# Patient Record
Sex: Female | Born: 1960
Health system: Southern US, Community
[De-identification: ages and names within clinical notes are randomized; demographics above are authoritative.]

## PROBLEM LIST (undated history)

## (undated) DIAGNOSIS — C801 Malignant (primary) neoplasm, unspecified: Secondary | ICD-10-CM

## (undated) DIAGNOSIS — M1990 Unspecified osteoarthritis, unspecified site: Secondary | ICD-10-CM

## (undated) DIAGNOSIS — R112 Nausea with vomiting, unspecified: Secondary | ICD-10-CM

## (undated) DIAGNOSIS — K219 Gastro-esophageal reflux disease without esophagitis: Secondary | ICD-10-CM

## (undated) DIAGNOSIS — M545 Low back pain, unspecified: Secondary | ICD-10-CM

## (undated) DIAGNOSIS — G629 Polyneuropathy, unspecified: Secondary | ICD-10-CM

## (undated) DIAGNOSIS — Z9889 Other specified postprocedural states: Secondary | ICD-10-CM

## (undated) DIAGNOSIS — R7303 Prediabetes: Secondary | ICD-10-CM

## (undated) DIAGNOSIS — Z8489 Family history of other specified conditions: Secondary | ICD-10-CM

## (undated) DIAGNOSIS — G8929 Other chronic pain: Secondary | ICD-10-CM

## (undated) HISTORY — PX: APPENDECTOMY: SHX54

## (undated) HISTORY — PX: JOINT REPLACEMENT: SHX530

---

## 2005-10-08 ENCOUNTER — Other Ambulatory Visit: Admission: RE | Admit: 2005-10-08 | Discharge: 2005-10-08 | Payer: Self-pay | Admitting: Obstetrics and Gynecology

## 2009-10-08 ENCOUNTER — Encounter: Admission: RE | Admit: 2009-10-08 | Discharge: 2009-10-08 | Payer: Self-pay | Admitting: Family Medicine

## 2012-07-04 ENCOUNTER — Other Ambulatory Visit: Payer: Self-pay | Admitting: Family Medicine

## 2012-07-04 DIAGNOSIS — Z1231 Encounter for screening mammogram for malignant neoplasm of breast: Secondary | ICD-10-CM

## 2012-07-27 ENCOUNTER — Ambulatory Visit
Admission: RE | Admit: 2012-07-27 | Discharge: 2012-07-27 | Disposition: A | Discharge status: Home / Self Care | Payer: BC Managed Care – PPO | Source: Ambulatory Visit | Attending: Family Medicine | Admitting: Family Medicine

## 2012-07-27 DIAGNOSIS — Z1231 Encounter for screening mammogram for malignant neoplasm of breast: Secondary | ICD-10-CM

## 2012-09-17 LAB — CBC WITH DIFFERENTIAL/PLATELET
Basophil #: 0 10*3/uL (ref 0.0–0.1)
Eosinophil #: 0 10*3/uL (ref 0.0–0.7)
Eosinophil %: 0.3 %
HCT: 41.5 % (ref 35.0–47.0)
Lymphocyte %: 11.5 %
MCH: 32 pg (ref 26.0–34.0)
MCV: 92 fL (ref 80–100)
Monocyte #: 0.5 x10 3/mm (ref 0.2–0.9)
Monocyte %: 5.8 %
Neutrophil #: 6.4 10*3/uL (ref 1.4–6.5)
RBC: 4.52 10*6/uL (ref 3.80–5.20)
RDW: 12.7 % (ref 11.5–14.5)
WBC: 7.8 10*3/uL (ref 3.6–11.0)

## 2012-09-17 LAB — COMPREHENSIVE METABOLIC PANEL
Bilirubin,Total: 0.3 mg/dL (ref 0.2–1.0)
Calcium, Total: 8.9 mg/dL (ref 8.5–10.1)
Chloride: 106 mmol/L (ref 98–107)
Co2: 29 mmol/L (ref 21–32)
EGFR (Non-African Amer.): >60
Glucose: 131 mg/dL — ABNORMAL HIGH (ref 65–99)
Potassium: 4 mmol/L (ref 3.5–5.1)
SGOT(AST): 21 U/L (ref 15–37)
Total Protein: 8.1 g/dL (ref 6.4–8.2)

## 2012-09-18 ENCOUNTER — Observation Stay: Payer: Self-pay | Admitting: Internal Medicine

## 2012-09-18 LAB — LIPID PANEL
HDL Cholesterol: 59 mg/dL (ref 40–60)
Ldl Cholesterol, Calc: 119 mg/dL — ABNORMAL HIGH (ref 0–100)
VLDL Cholesterol, Calc: 10 mg/dL (ref 5–40)

## 2012-09-18 LAB — URINALYSIS, COMPLETE
Blood: NEGATIVE
Glucose,UR: NEGATIVE mg/dL (ref 0–75)
Ketone: NEGATIVE
Nitrite: NEGATIVE
Ph: 8 (ref 4.5–8.0)
Specific Gravity: 1.013 (ref 1.003–1.030)
WBC UR: 5 /HPF (ref 0–5)

## 2013-01-23 ENCOUNTER — Other Ambulatory Visit: Payer: Self-pay | Admitting: Family Medicine

## 2013-01-23 DIAGNOSIS — N951 Menopausal and female climacteric states: Secondary | ICD-10-CM

## 2013-04-03 ENCOUNTER — Ambulatory Visit
Admission: RE | Admit: 2013-04-03 | Discharge: 2013-04-03 | Disposition: A | Discharge status: Home / Self Care | Payer: BC Managed Care – PPO | Source: Ambulatory Visit | Attending: Family Medicine | Admitting: Family Medicine

## 2013-04-03 DIAGNOSIS — N951 Menopausal and female climacteric states: Secondary | ICD-10-CM

## 2013-07-12 ENCOUNTER — Other Ambulatory Visit: Payer: Self-pay

## 2013-07-12 DIAGNOSIS — Z1231 Encounter for screening mammogram for malignant neoplasm of breast: Secondary | ICD-10-CM

## 2013-08-23 ENCOUNTER — Ambulatory Visit
Admission: RE | Admit: 2013-08-23 | Discharge: 2013-08-23 | Disposition: A | Discharge status: Home / Self Care | Payer: BC Managed Care – PPO | Source: Ambulatory Visit

## 2013-08-23 DIAGNOSIS — Z1231 Encounter for screening mammogram for malignant neoplasm of breast: Secondary | ICD-10-CM

## 2014-08-28 ENCOUNTER — Other Ambulatory Visit: Payer: Self-pay

## 2014-08-28 DIAGNOSIS — Z1231 Encounter for screening mammogram for malignant neoplasm of breast: Secondary | ICD-10-CM

## 2014-09-07 ENCOUNTER — Ambulatory Visit
Admission: RE | Admit: 2014-09-07 | Discharge: 2014-09-07 | Disposition: A | Discharge status: Home / Self Care | Payer: BLUE CROSS/BLUE SHIELD | Source: Ambulatory Visit

## 2014-09-07 ENCOUNTER — Encounter (INDEPENDENT_AMBULATORY_CARE_PROVIDER_SITE_OTHER): Payer: Self-pay

## 2014-09-07 DIAGNOSIS — Z1231 Encounter for screening mammogram for malignant neoplasm of breast: Secondary | ICD-10-CM

## 2014-11-02 NOTE — Discharge Summary (Signed)
PATIENT NAME:  Stacey [name], Stacey [name] MR#:  [id number] DATE OF BIRTH:  1961/03/01  DATE OF ADMISSION:  09/18/2012 DATE OF DISCHARGE:  09/19/2012  ADMITTING DIAGNOSIS:  Dizziness.  DISCHARGE DIAGNOSES:   1.  Dizziness, likely peripheral vestibular problem. 2.  Nausea and vomiting. 3. Chronic cervical lumbar area pains, radiculopathy, to the left arm as well as left lower extremity.  4.  Hyperglycemia with hemoglobin A1C of 6.3. 5.  Obesity.  6.  Hyperlipidemia with LDL of 119.  DISCHARGE CONDITION: Stable.   DISCHARGE MEDICATIONS:  1.  The patient is to continue tapering prednisone at 40 p.o. once on 09/20/2012 then taper by 10 mg daily until stopped.  2.  Meclizine 25 mg 2 daily.  3.  Aspirin 81 mg p.o. daily. 4.  Diltiazem 5 p.o. every 8 hours as needed for dizziness, vertigo.  5.  Senna 1 tablet twice daily as needed.  6.  Norflex 100 mg twice daily.   HOME OXYGEN:  None.    DIET:  Low salt, low fat, low cholesterol, regular consistency.  The patient was advised to lose weight if possible. She was also not to [place] any sweet or fatty foods.  ACTIVITY LIMITATIONS:  As tolerated.  She is referred to outpatient physical therapy for vestibular maneuvers.    FOLLOW UP:  She is to follow up with Dr. Kary Kos 2 days after discharge at [facility].  CONSULTANTS:   1.  Care management. 2.  Physical therapy.  RADIOLOGIC STUDIES: Chest portable single view, 09/17/2012 showed no acute cardiopulmonary disease. CT of head without contrast 09/17/2012, revealed no acute intracranial abnormality. MRI of brain without contrast showed no acute intracranial abnormality. The patient had MRA with and without contrast showed no evidence of hemodynamically significant stenosis in the extracranial carotid arteries.   HISTORY OF PRESENT ILLNESS:  The patient is a 54 year old Caucasian female with history of degenerative disk disease, cervical as well as lumbosacral area pains, who presented to the  hospital on 09/18/2012 with complaints of dizziness as well as nausea, vomiting.  Please refer to Dr. Ward Givens admission note on 09/18/2012. On arrival to the hospital, the patient's, temperature was 98, pulse was 70, respiration rate 18, blood pressure 140/80, saturation was 98% on room air.  Physical exam was unremarkable.    LABORATORY AND DIAGNOSTIC DATA:  CT of the head was normal.  The patient's labs done on 09/17/2012, showed elevated glucose to 131, otherwise BMP was unremarkable. The patient's liver enzymes were normal. Cardiac enzymes, first set and the only set, was normal.  TSH was normal at 0.127. CBC was within normal limits. Urinalysis revealed yellow clear urine, negative for glucose, bilirubin or ketones, specific gravity was 1.013, pH was 8.0, negative for protein, nitrites with 1+ leukocyte esterase was noted, 1 red blood cell, 5 white blood cells were seen.  There was trace bacteria as well as 2 epithelial cells and mucus was present. EKG showed normal sinus rhythm at 71 beats per minute, premature atrial complexes, otherwise normal EKG. CT scan as well as x-ray were within normal limits.   HOSPITAL COURSE:  The patient was admitted to the hospital was concerns about possible TIA versus vestibular basilar insufficiency and the MRI of brain as well as MRA of the carotids and vertebral arteries was performed, which was within normal limits. It was felt that the patient condition was very likely related to peripheral vestibular problems, and the patient was initiated on meclizine as well as a prednisone taper and  valium. With this, she somewhat improved, however, her improvement was not complete.  She had PT while she was in the hospital who recommended outpatient physical therapy. She is to follow up with her primary care physician in the next few days after discharge to manage her dizziness.  In regard to her chronic cervical  as well as lumbosacral area pain as well as radiculopathy, the  patient will be scheduled with orthopedic surgeon for possible nerve block as an outpatient.  Meanwhile she is to continue Norflex as well as Tylenol if needed and aspirin. The patient was noted to have hyperglycemia. Her hemoglobin A1c was checked and was found to be 6.3. The patient was counseled about diet as well as weight loss and recommended to lose weight because she is prediabetic.  In regards to obesity and we checked her lipid panel and LDL was found to be high at 118, her TSH was normal and as mentioned above, hemoglobin A1c was 6.3. The patient was counseled about fatty foods and was advised to cut down oral intake overall as well as lose weight and exercise if possible.   Since the patient had some facial droop which was noted in the Emergency Room, it was felt that the safest would be for the patient to be initiated on aspirin therapy just in case she had a transient ischemic attack.  We are not convinced that she did, in fact, that she did have a transient ischemic attack, however, we could not completely rule out this.  For this reason of made decision to initiate her on aspirin therapy   CONDITION ON DISCHARGE:  The patient is being discharged in stable condition with the above-mentioned medications and follow-up. Her vital signs are reviewed discharge: Temperature is 97.9, pulse 62, respiratory rate was 21 to 24, blood pressure 135/79, saturation was 96% to 98% on room air at rest.   Time spent:  40 minutes.    ____________________________ Theodoro Grist, MD rv:ct D: 09/19/2012 18:33:20 ET T: 09/20/2012 10:50:31 ET JOB#: [id number]  cc: Theodoro Grist, MD, <Dictator> Irven Easterly. Kary Kos, MD Theodoro Grist MD ELECTRONICALLY SIGNED 10/04/2012 15:13

## 2014-11-02 NOTE — H&P (Signed)
PATIENT NAME:  Stacey [name], SNIPE MR#:  [id number] DATE OF BIRTH:  08-30-1960  DATE OF ADMISSION:  09/17/2012  PRIMARY CARE PHYSICIAN:  None.   REFERRING PHYSICIAN:  Dr. Conni Slipper.   CHIEF COMPLAINT:  Dizziness.  Nausea, vomiting.   HISTORY OF PRESENT ILLNESS:  The patient is a 54 year old pleasant white female with no past medical history, presented to the Emergency Department with complaints of dizziness, nausea, vomiting, started since this morning.  The patient woke up this morning, started to have headache.  Went out to clean the yard.  Started to experience pain in the neck.  The pain was gradually getting worse, associated with severe dizziness.  Had multiple episodes of vomiting.  The dizziness is especially worse with any movement.  Per Emergency Department physician, the patient had some facial droop, however per patient no obvious signs of facial droop is noted.  The patient experiences occasionally these symptoms associated with neck pain.  The patient goes to chiropractor 3 times a year.  Denies having any sinus congestion or ringing in the ears.  Denies having any weakness in any part of the body, however when patient felt nauseous, experienced weakness in both lower extremities.  The patient states had significant improvement with her symptoms after staying in the Emergency Department.   PAST MEDICAL HISTORY:  Back pain.   PAST SURGICAL HISTORY:  Appendectomy.   ALLERGIES:  No known drug allergies.   HOME MEDICATIONS:  None.   SOCIAL HISTORY:  No history of smoking, drinking alcohol or using illicit drugs.  Works for a [employer].   FAMILY HISTORY:  Mother with hypertension.   REVIEW OF SYSTEMS:  CONSTITUTIONAL:  Mild generalized weakness.  EYES:  No change in vision.  HEENT:  No runny nose, sore throat or ringing in the ears.  RESPIRATORY:  No shortness of breath.  No cough.  CARDIOVASCULAR:  No chest pain, palpitations.  GASTROINTESTINAL:  Has nausea, had a  few episodes of vomiting.  No diarrhea.  No abdominal pain.  GENITOURINARY:  No polyuria, polydipsia.  SKIN:  No rash or skin lesions.  HEMATOLOGIC:  No easy bruising or bleeding.  NEUROLOGIC:  Per Emergency Department physician there was facial droop evident at the time of the presentation.   PHYSICAL EXAMINATION:  GENERAL:  This is a well-built, well-nourished age appropriate female lying down in the bed, not in distress.  VITAL SIGNS:  At the time of the presentation, temperature 98, pulse 70, blood pressure 140/80, respiratory rate of 18.  Oxygen saturation is 98% on room air.  HEENT:  Head normocephalic, atraumatic.  There is no scleral icterus.  Conjunctivae normal.  Pupils equal and react to light.  Mucous membranes moist.   NECK:  Supple.  No lymphadenopathy.  No JVD.  No carotid bruit.  CHEST:  Has no focal tenderness.  LUNGS:  Bilateral clear to auscultation.  HEART:  S1 and S2, regular.  No murmurs are heard.  ABDOMEN:  Bowel sounds present, soft, nontender, nondistended.  EXTREMITIES:  No pedal edema.  Pulses 2+.  NEUROLOGIC:  The patient had no nystagmus.  Negative for alternating movements and nose to finger test.  No cranial nerve deficits.  Motor 5 by 5 in upper and lower extremities.   LABORATORY DATA:  CBC is completely within normal limits.  CMP is completely within normal limits.   CT head without contrast, no acute cardiopulmonary disease.   ASSESSMENT AND PLAN:  The patient is a 54 year old female who comes to  the Emergency Department with dizziness with questionable facial droop.  1.  Dizziness, seems to be more of either a vestibular neuritis or possibly vertebrobasilar insufficiency.  We will obtain MRI and MRA of the brain.  Admit the patient to the telemetry.  Continue with supportive care for the dizziness.  2.  Back and neck pain, possible degenerative joint disease of the neck.  MRA should demonstrate if patient has any underlying degenerative joint disease of  the neck.  3.  Nausea, vomiting.  We will provide supportive care with nausea medications.  4.  Keep the patient on deep vein thrombosis prophylaxis with Lovenox.   Time spent 7min.   ____________________________ Monica Becton, MD pv:ea D: 09/18/2012 01:08:13 ET T: 09/18/2012 03:10:13 ET JOB#: [id number]  cc: Monica Becton, MD, <Dictator> Monica Becton MD ELECTRONICALLY SIGNED 09/20/2012 7:05

## 2015-10-14 DIAGNOSIS — M531 Cervicobrachial syndrome: Secondary | ICD-10-CM | POA: Diagnosis not present

## 2015-10-14 DIAGNOSIS — M9902 Segmental and somatic dysfunction of thoracic region: Secondary | ICD-10-CM | POA: Diagnosis not present

## 2015-10-14 DIAGNOSIS — M9901 Segmental and somatic dysfunction of cervical region: Secondary | ICD-10-CM | POA: Diagnosis not present

## 2016-01-20 DIAGNOSIS — M65811 Other synovitis and tenosynovitis, right shoulder: Secondary | ICD-10-CM | POA: Diagnosis not present

## 2016-01-20 DIAGNOSIS — M531 Cervicobrachial syndrome: Secondary | ICD-10-CM | POA: Diagnosis not present

## 2016-01-20 DIAGNOSIS — M65812 Other synovitis and tenosynovitis, left shoulder: Secondary | ICD-10-CM | POA: Diagnosis not present

## 2016-01-20 DIAGNOSIS — M9901 Segmental and somatic dysfunction of cervical region: Secondary | ICD-10-CM | POA: Diagnosis not present

## 2016-04-01 DIAGNOSIS — M5417 Radiculopathy, lumbosacral region: Secondary | ICD-10-CM | POA: Diagnosis not present

## 2016-04-01 DIAGNOSIS — M9903 Segmental and somatic dysfunction of lumbar region: Secondary | ICD-10-CM | POA: Diagnosis not present

## 2016-04-01 DIAGNOSIS — M9901 Segmental and somatic dysfunction of cervical region: Secondary | ICD-10-CM | POA: Diagnosis not present

## 2016-04-01 DIAGNOSIS — M531 Cervicobrachial syndrome: Secondary | ICD-10-CM | POA: Diagnosis not present

## 2016-04-07 DIAGNOSIS — M5417 Radiculopathy, lumbosacral region: Secondary | ICD-10-CM | POA: Diagnosis not present

## 2016-04-07 DIAGNOSIS — M9903 Segmental and somatic dysfunction of lumbar region: Secondary | ICD-10-CM | POA: Diagnosis not present

## 2016-04-07 DIAGNOSIS — M531 Cervicobrachial syndrome: Secondary | ICD-10-CM | POA: Diagnosis not present

## 2016-04-07 DIAGNOSIS — M9901 Segmental and somatic dysfunction of cervical region: Secondary | ICD-10-CM | POA: Diagnosis not present

## 2016-04-23 DIAGNOSIS — M5417 Radiculopathy, lumbosacral region: Secondary | ICD-10-CM | POA: Diagnosis not present

## 2016-04-23 DIAGNOSIS — M531 Cervicobrachial syndrome: Secondary | ICD-10-CM | POA: Diagnosis not present

## 2016-04-23 DIAGNOSIS — M9901 Segmental and somatic dysfunction of cervical region: Secondary | ICD-10-CM | POA: Diagnosis not present

## 2016-04-23 DIAGNOSIS — M9903 Segmental and somatic dysfunction of lumbar region: Secondary | ICD-10-CM | POA: Diagnosis not present

## 2016-04-30 DIAGNOSIS — M9901 Segmental and somatic dysfunction of cervical region: Secondary | ICD-10-CM | POA: Diagnosis not present

## 2016-04-30 DIAGNOSIS — M9903 Segmental and somatic dysfunction of lumbar region: Secondary | ICD-10-CM | POA: Diagnosis not present

## 2016-04-30 DIAGNOSIS — M531 Cervicobrachial syndrome: Secondary | ICD-10-CM | POA: Diagnosis not present

## 2016-04-30 DIAGNOSIS — M5417 Radiculopathy, lumbosacral region: Secondary | ICD-10-CM | POA: Diagnosis not present

## 2016-05-04 DIAGNOSIS — M9903 Segmental and somatic dysfunction of lumbar region: Secondary | ICD-10-CM | POA: Diagnosis not present

## 2016-05-04 DIAGNOSIS — M531 Cervicobrachial syndrome: Secondary | ICD-10-CM | POA: Diagnosis not present

## 2016-05-04 DIAGNOSIS — M5417 Radiculopathy, lumbosacral region: Secondary | ICD-10-CM | POA: Diagnosis not present

## 2016-05-04 DIAGNOSIS — M9901 Segmental and somatic dysfunction of cervical region: Secondary | ICD-10-CM | POA: Diagnosis not present

## 2016-05-07 DIAGNOSIS — M9901 Segmental and somatic dysfunction of cervical region: Secondary | ICD-10-CM | POA: Diagnosis not present

## 2016-05-07 DIAGNOSIS — M5417 Radiculopathy, lumbosacral region: Secondary | ICD-10-CM | POA: Diagnosis not present

## 2016-05-07 DIAGNOSIS — M9903 Segmental and somatic dysfunction of lumbar region: Secondary | ICD-10-CM | POA: Diagnosis not present

## 2016-05-07 DIAGNOSIS — M531 Cervicobrachial syndrome: Secondary | ICD-10-CM | POA: Diagnosis not present

## 2016-05-12 DIAGNOSIS — M9901 Segmental and somatic dysfunction of cervical region: Secondary | ICD-10-CM | POA: Diagnosis not present

## 2016-05-12 DIAGNOSIS — M531 Cervicobrachial syndrome: Secondary | ICD-10-CM | POA: Diagnosis not present

## 2016-05-12 DIAGNOSIS — M5417 Radiculopathy, lumbosacral region: Secondary | ICD-10-CM | POA: Diagnosis not present

## 2016-05-12 DIAGNOSIS — M9903 Segmental and somatic dysfunction of lumbar region: Secondary | ICD-10-CM | POA: Diagnosis not present

## 2016-07-09 DIAGNOSIS — J019 Acute sinusitis, unspecified: Secondary | ICD-10-CM | POA: Diagnosis not present

## 2016-07-09 DIAGNOSIS — Z6834 Body mass index (BMI) 34.0-34.9, adult: Secondary | ICD-10-CM | POA: Diagnosis not present

## 2016-07-09 DIAGNOSIS — E669 Obesity, unspecified: Secondary | ICD-10-CM | POA: Diagnosis not present

## 2016-07-09 DIAGNOSIS — R6889 Other general symptoms and signs: Secondary | ICD-10-CM | POA: Diagnosis not present

## 2016-08-12 DIAGNOSIS — R6889 Other general symptoms and signs: Secondary | ICD-10-CM | POA: Diagnosis not present

## 2016-09-09 DIAGNOSIS — L239 Allergic contact dermatitis, unspecified cause: Secondary | ICD-10-CM | POA: Diagnosis not present

## 2016-12-25 DIAGNOSIS — M436 Torticollis: Secondary | ICD-10-CM | POA: Diagnosis not present

## 2016-12-25 DIAGNOSIS — M531 Cervicobrachial syndrome: Secondary | ICD-10-CM | POA: Diagnosis not present

## 2017-03-26 DIAGNOSIS — M542 Cervicalgia: Secondary | ICD-10-CM | POA: Diagnosis not present

## 2017-03-26 DIAGNOSIS — M545 Low back pain: Secondary | ICD-10-CM | POA: Diagnosis not present

## 2017-03-26 DIAGNOSIS — G8929 Other chronic pain: Secondary | ICD-10-CM | POA: Diagnosis not present

## 2017-04-28 DIAGNOSIS — M542 Cervicalgia: Secondary | ICD-10-CM | POA: Diagnosis not present

## 2017-04-28 DIAGNOSIS — M545 Low back pain: Secondary | ICD-10-CM | POA: Diagnosis not present

## 2017-04-28 DIAGNOSIS — M50822 Other cervical disc disorders at C5-C6 level: Secondary | ICD-10-CM | POA: Diagnosis not present

## 2017-04-28 DIAGNOSIS — G8929 Other chronic pain: Secondary | ICD-10-CM | POA: Diagnosis not present

## 2017-05-04 DIAGNOSIS — M19012 Primary osteoarthritis, left shoulder: Secondary | ICD-10-CM | POA: Diagnosis not present

## 2017-05-04 DIAGNOSIS — M25512 Pain in left shoulder: Secondary | ICD-10-CM | POA: Diagnosis not present

## 2017-05-07 DIAGNOSIS — M5136 Other intervertebral disc degeneration, lumbar region: Secondary | ICD-10-CM | POA: Diagnosis not present

## 2017-05-07 DIAGNOSIS — M50822 Other cervical disc disorders at C5-C6 level: Secondary | ICD-10-CM | POA: Diagnosis not present

## 2017-05-07 DIAGNOSIS — M542 Cervicalgia: Secondary | ICD-10-CM | POA: Diagnosis not present

## 2017-05-12 DIAGNOSIS — M19012 Primary osteoarthritis, left shoulder: Secondary | ICD-10-CM | POA: Diagnosis not present

## 2017-05-13 DIAGNOSIS — M50823 Other cervical disc disorders at C6-C7 level: Secondary | ICD-10-CM | POA: Diagnosis not present

## 2017-05-13 DIAGNOSIS — M5136 Other intervertebral disc degeneration, lumbar region: Secondary | ICD-10-CM | POA: Diagnosis not present

## 2017-05-13 DIAGNOSIS — M5030 Other cervical disc degeneration, unspecified cervical region: Secondary | ICD-10-CM | POA: Diagnosis not present

## 2017-05-13 DIAGNOSIS — M50822 Other cervical disc disorders at C5-C6 level: Secondary | ICD-10-CM | POA: Diagnosis not present

## 2017-06-01 DIAGNOSIS — M50823 Other cervical disc disorders at C6-C7 level: Secondary | ICD-10-CM | POA: Diagnosis not present

## 2017-06-01 DIAGNOSIS — M50822 Other cervical disc disorders at C5-C6 level: Secondary | ICD-10-CM | POA: Diagnosis not present

## 2017-06-01 DIAGNOSIS — M5136 Other intervertebral disc degeneration, lumbar region: Secondary | ICD-10-CM | POA: Diagnosis not present

## 2017-07-15 DIAGNOSIS — M545 Low back pain: Secondary | ICD-10-CM | POA: Diagnosis not present

## 2017-07-15 DIAGNOSIS — G894 Chronic pain syndrome: Secondary | ICD-10-CM | POA: Diagnosis not present

## 2017-07-15 DIAGNOSIS — M542 Cervicalgia: Secondary | ICD-10-CM | POA: Diagnosis not present

## 2017-07-15 DIAGNOSIS — Z79899 Other long term (current) drug therapy: Secondary | ICD-10-CM | POA: Diagnosis not present

## 2017-08-05 DIAGNOSIS — J019 Acute sinusitis, unspecified: Secondary | ICD-10-CM | POA: Diagnosis not present

## 2017-08-05 DIAGNOSIS — Z6835 Body mass index (BMI) 35.0-35.9, adult: Secondary | ICD-10-CM | POA: Diagnosis not present

## 2017-08-30 DIAGNOSIS — Z6835 Body mass index (BMI) 35.0-35.9, adult: Secondary | ICD-10-CM | POA: Diagnosis not present

## 2017-08-30 DIAGNOSIS — E669 Obesity, unspecified: Secondary | ICD-10-CM | POA: Diagnosis not present

## 2017-08-30 DIAGNOSIS — R7303 Prediabetes: Secondary | ICD-10-CM | POA: Diagnosis not present

## 2017-08-30 DIAGNOSIS — E7800 Pure hypercholesterolemia, unspecified: Secondary | ICD-10-CM | POA: Diagnosis not present

## 2017-08-30 DIAGNOSIS — R030 Elevated blood-pressure reading, without diagnosis of hypertension: Secondary | ICD-10-CM | POA: Diagnosis not present

## 2017-09-29 DIAGNOSIS — M19012 Primary osteoarthritis, left shoulder: Secondary | ICD-10-CM | POA: Diagnosis not present

## 2017-09-29 DIAGNOSIS — M25512 Pain in left shoulder: Secondary | ICD-10-CM | POA: Diagnosis not present

## 2017-10-06 DIAGNOSIS — M19012 Primary osteoarthritis, left shoulder: Secondary | ICD-10-CM | POA: Diagnosis not present

## 2017-10-15 ENCOUNTER — Other Ambulatory Visit: Payer: Self-pay | Admitting: Orthopedic Surgery

## 2017-10-15 DIAGNOSIS — M19012 Primary osteoarthritis, left shoulder: Secondary | ICD-10-CM

## 2017-10-23 ENCOUNTER — Ambulatory Visit
Admission: RE | Admit: 2017-10-23 | Discharge: 2017-10-23 | Disposition: A | Discharge status: Home / Self Care | Payer: Self-pay | Source: Ambulatory Visit | Attending: Orthopedic Surgery | Admitting: Orthopedic Surgery

## 2017-10-23 DIAGNOSIS — Z01818 Encounter for other preprocedural examination: Secondary | ICD-10-CM | POA: Diagnosis not present

## 2017-10-23 DIAGNOSIS — M19012 Primary osteoarthritis, left shoulder: Secondary | ICD-10-CM | POA: Diagnosis not present

## 2017-11-29 DIAGNOSIS — M19012 Primary osteoarthritis, left shoulder: Secondary | ICD-10-CM | POA: Diagnosis not present

## 2017-12-03 DIAGNOSIS — E7800 Pure hypercholesterolemia, unspecified: Secondary | ICD-10-CM | POA: Diagnosis not present

## 2017-12-03 DIAGNOSIS — M19012 Primary osteoarthritis, left shoulder: Secondary | ICD-10-CM | POA: Diagnosis not present

## 2017-12-03 DIAGNOSIS — Z01818 Encounter for other preprocedural examination: Secondary | ICD-10-CM | POA: Diagnosis not present

## 2017-12-03 DIAGNOSIS — R7303 Prediabetes: Secondary | ICD-10-CM | POA: Diagnosis not present

## 2017-12-31 NOTE — Pre-Procedure Instructions (Signed)
Stacey [name]  12/31/2017    Your procedure is scheduled on Tuesday, January 11, 2018 at 12:30 PM.   Report to [facility] Entrance "A" Admitting Office at 10:30 AM.   Call this number if you have problems the morning of surgery: [phone]   Questions prior to day of surgery, please call [phone] between 8 & 4 PM.   Remember:  Do not eat or drink after midnight Monday, 01/10/18.  Take these medicines the morning of surgery with A SIP OF WATER: Ranitidine (Zantac) - if needed, Tramadol - if needed    Do not wear jewelry, make-up or nail polish.  Do not wear lotions, powders, perfumes or deodorant.  Do not shave 48 hours prior to surgery.    Do not bring valuables to the hospital.  [facility] is not responsible for any belongings or valuables.  Contacts, dentures or bridgework may not be worn into surgery.  Leave your suitcase in the car.  After surgery it may be brought to your room.  For patients admitted to the hospital, discharge time will be determined by your treatment team.  [facility] - Preparing for Surgery  Before surgery, you can play an important role.  Because skin is not sterile, your skin needs to be as free of germs as possible.  You can reduce the number of germs on you skin by washing with CHG (chlorahexidine gluconate) soap before surgery.  CHG is an antiseptic cleaner which kills germs and bonds with the skin to continue killing germs even after washing.  Oral Hygiene is also important in reducing the risk of infection.  Remember to brush your teeth with your regular toothpaste the morning of surgery.  Please DO NOT use if you have an allergy to CHG or antibacterial soaps.  If your skin becomes reddened/irritated stop using the CHG and inform your nurse when you arrive at Short Stay.  Do not shave (including legs and underarms) for at least 48 hours prior to the first CHG shower.  You may shave your face.  Please follow these instructions  carefully:   1.  Shower with CHG Soap the night before surgery and the morning of Surgery.  2.  If you choose to wash your hair, wash your hair first as usual with your normal shampoo.  3.  After you shampoo, rinse your hair and body thoroughly to remove the shampoo. 4.  Use CHG as you would any other liquid soap.  You can apply chg directly to the skin and wash gently with a      scrungie or washcloth.           5.  Apply the CHG Soap to your body ONLY FROM THE NECK DOWN.   Do not use on open wounds or open sores. Avoid contact with your eyes, ears, mouth and genitals (private parts).  Wash genitals (private parts) with your normal soap.  6.  Wash thoroughly, paying special attention to the area where your surgery will be performed.  7.  Thoroughly rinse your body with warm water from the neck down.  8.  DO NOT shower/wash with your normal soap after using and rinsing off the CHG Soap.  9.  Pat yourself dry with a clean towel.            10.  Wear clean pajamas.            11.  Place clean sheets on your bed the night of your  first shower and do not sleep with pets.  Day of Surgery  Shower as above. Do not apply any lotions/deodorants the morning of surgery.   Please wear clean clothes to the hospital. Remember to brush your teeth with toothpaste.   Please read over the fact sheets that you were given.

## 2018-01-03 ENCOUNTER — Other Ambulatory Visit: Payer: Self-pay

## 2018-01-03 ENCOUNTER — Encounter (HOSPITAL_COMMUNITY): Payer: Self-pay

## 2018-01-03 ENCOUNTER — Encounter (HOSPITAL_COMMUNITY)
Admission: RE | Admit: 2018-01-03 | Discharge: 2018-01-03 | Disposition: A | Discharge status: Home / Self Care | Payer: BLUE CROSS/BLUE SHIELD | Source: Ambulatory Visit | Attending: Orthopedic Surgery | Admitting: Orthopedic Surgery

## 2018-01-03 DIAGNOSIS — K219 Gastro-esophageal reflux disease without esophagitis: Secondary | ICD-10-CM | POA: Diagnosis not present

## 2018-01-03 DIAGNOSIS — Z01812 Encounter for preprocedural laboratory examination: Secondary | ICD-10-CM | POA: Insufficient documentation

## 2018-01-03 DIAGNOSIS — M158 Other polyosteoarthritis: Secondary | ICD-10-CM | POA: Insufficient documentation

## 2018-01-03 DIAGNOSIS — R7303 Prediabetes: Secondary | ICD-10-CM | POA: Diagnosis not present

## 2018-01-03 HISTORY — DX: Family history of other specified conditions: Z84.89

## 2018-01-03 HISTORY — DX: Unspecified osteoarthritis, unspecified site: M19.90

## 2018-01-03 HISTORY — DX: Gastro-esophageal reflux disease without esophagitis: K21.9

## 2018-01-03 HISTORY — DX: Prediabetes: R73.03

## 2018-01-03 HISTORY — DX: Other specified postprocedural states: Z98.890

## 2018-01-03 HISTORY — DX: Nausea with vomiting, unspecified: Z98.890

## 2018-01-03 HISTORY — DX: Nausea with vomiting, unspecified: R11.2

## 2018-01-03 LAB — BASIC METABOLIC PANEL
Anion gap: 10 (ref 5–15)
BUN: 8 mg/dL (ref 6–20)
CHLORIDE: 103 mmol/L (ref 101–111)
CO2: 25 mmol/L (ref 22–32)
Calcium: 8.8 mg/dL — ABNORMAL LOW (ref 8.9–10.3)
Creatinine, Ser: 0.71 mg/dL (ref 0.44–1.00)
GFR calc Af Amer: >60 mL/min (ref >60)
Glucose, Bld: 92 mg/dL (ref 65–99)
POTASSIUM: 3.9 mmol/L (ref 3.5–5.1)
SODIUM: 138 mmol/L (ref 135–145)

## 2018-01-03 LAB — CBC
HCT: 42.3 % (ref 36.0–46.0)
HEMOGLOBIN: 13.4 g/dL (ref 12.0–15.0)
MCH: 30.2 pg (ref 26.0–34.0)
MCHC: 31.7 g/dL (ref 30.0–36.0)
MCV: 95.5 fL (ref 78.0–100.0)
PLATELETS: 198 10*3/uL (ref 150–400)
RBC: 4.43 MIL/uL (ref 3.87–5.11)
RDW: 12.7 % (ref 11.5–15.5)
WBC: 5.1 10*3/uL (ref 4.0–10.5)

## 2018-01-03 LAB — NO BLOOD PRODUCTS

## 2018-01-03 LAB — SURGICAL PCR SCREEN
MRSA, PCR: NEGATIVE
STAPHYLOCOCCUS AUREUS: NEGATIVE

## 2018-01-03 LAB — GLUCOSE, CAPILLARY: Glucose-Capillary: 81 mg/dL (ref 65–99)

## 2018-01-03 NOTE — Progress Notes (Addendum)
Pt denies cardiac history, chest pain or sob. Pt is pre-diabetic. States her PCP did her A1C a month ago, but she is not sure of the results. She states she does not check her blood sugar at home. Pt is also a Jehovah's Witness, has signed the Blood Refusal form, copy of her POA made. Pt states she had an EKG done last month also. Will request lab results and EKG from PCP, [facility] office.

## 2018-01-10 DIAGNOSIS — G894 Chronic pain syndrome: Secondary | ICD-10-CM | POA: Diagnosis not present

## 2018-01-10 DIAGNOSIS — M19012 Primary osteoarthritis, left shoulder: Secondary | ICD-10-CM | POA: Diagnosis not present

## 2018-01-10 MED ORDER — TRANEXAMIC ACID 1000 MG/10ML IV SOLN
1000.0000 mg | INTRAVENOUS | Status: AC
  Administered 2018-01-11: 1000 mg via INTRAVENOUS
  Filled 2018-01-10: qty 1100

## 2018-01-10 NOTE — Anesthesia Preprocedure Evaluation (Addendum)
Anesthesia Evaluation  Patient identified by MRN, date of birth, ID band Patient awake    Reviewed: Allergy & Precautions, NPO status , Patient's Chart, lab work & pertinent test results  History of Anesthesia Complications (+) PONV and Family history of anesthesia reaction  Airway Mallampati: II  TM Distance: >3 FB Neck ROM: Full    Dental no notable dental hx. (+) Teeth Intact, Caps   Pulmonary neg pulmonary ROS,    Pulmonary exam normal breath sounds clear to auscultation       Cardiovascular Exercise Tolerance: Good negative cardio ROS Normal cardiovascular exam Rhythm:Regular Rate:Normal     Neuro/Psych negative neurological ROS  negative psych ROS   GI/Hepatic Neg liver ROS, GERD  ,  Endo/Other  Morbid obesity  Renal/GU      Musculoskeletal   Abdominal (+) + obese,   Peds  Hematology  (+) JEHOVAH'S WITNESS  Anesthesia Other Findings   Reproductive/Obstetrics                            Lab Results  Component Value Date   WBC 5.1 01/03/2018   HGB 13.4 01/03/2018   HCT 42.3 01/03/2018   MCV 95.5 01/03/2018   PLT 198 01/03/2018    Anesthesia Physical Anesthesia Plan  ASA: II  Anesthesia Plan: General   Post-op Pain Management:  Regional for Post-op pain   Induction: Intravenous  PONV Risk Score and Plan: 0 and 1 and Scopolamine patch - Pre-op, Ondansetron, Dexamethasone and Treatment may vary due to age or medical condition  Airway Management Planned: Oral ETT  Additional Equipment:   Intra-op Plan:   Post-operative Plan: Extubation in OR  Informed Consent: I have reviewed the patients History and Physical, chart, labs and discussed the procedure including the risks, benefits and alternatives for the proposed anesthesia with the patient or authorized representative who has indicated his/her understanding and acceptance.   Dental advisory given  Plan Discussed  with: CRNA  Anesthesia Plan Comments:         Anesthesia Quick Evaluation

## 2018-01-11 ENCOUNTER — Other Ambulatory Visit: Payer: Self-pay

## 2018-01-11 ENCOUNTER — Inpatient Hospital Stay (HOSPITAL_COMMUNITY): Payer: BLUE CROSS/BLUE SHIELD

## 2018-01-11 ENCOUNTER — Inpatient Hospital Stay (HOSPITAL_COMMUNITY)
Admission: RE | Admit: 2018-01-11 | Discharge: 2018-01-13 | DRG: 483 | Disposition: A | Discharge status: Home / Self Care | Payer: BLUE CROSS/BLUE SHIELD | Source: Ambulatory Visit | Attending: Orthopedic Surgery | Admitting: Orthopedic Surgery

## 2018-01-11 ENCOUNTER — Inpatient Hospital Stay (HOSPITAL_COMMUNITY): Payer: BLUE CROSS/BLUE SHIELD | Admitting: Anesthesiology

## 2018-01-11 ENCOUNTER — Encounter (HOSPITAL_COMMUNITY): Payer: Self-pay | Admitting: General Practice

## 2018-01-11 ENCOUNTER — Encounter (HOSPITAL_COMMUNITY)
Admission: RE | Disposition: A | Discharge status: Home / Self Care | Payer: Self-pay | Source: Ambulatory Visit | Attending: Orthopedic Surgery

## 2018-01-11 DIAGNOSIS — R7303 Prediabetes: Secondary | ICD-10-CM | POA: Diagnosis not present

## 2018-01-11 DIAGNOSIS — M545 Low back pain: Secondary | ICD-10-CM | POA: Diagnosis present

## 2018-01-11 DIAGNOSIS — Z79899 Other long term (current) drug therapy: Secondary | ICD-10-CM

## 2018-01-11 DIAGNOSIS — G8918 Other acute postprocedural pain: Secondary | ICD-10-CM | POA: Diagnosis not present

## 2018-01-11 DIAGNOSIS — K219 Gastro-esophageal reflux disease without esophagitis: Secondary | ICD-10-CM | POA: Diagnosis not present

## 2018-01-11 DIAGNOSIS — M19012 Primary osteoarthritis, left shoulder: Secondary | ICD-10-CM | POA: Diagnosis not present

## 2018-01-11 DIAGNOSIS — G8929 Other chronic pain: Secondary | ICD-10-CM | POA: Diagnosis not present

## 2018-01-11 DIAGNOSIS — Z6834 Body mass index (BMI) 34.0-34.9, adult: Secondary | ICD-10-CM

## 2018-01-11 DIAGNOSIS — Z96612 Presence of left artificial shoulder joint: Secondary | ICD-10-CM | POA: Diagnosis not present

## 2018-01-11 DIAGNOSIS — Z471 Aftercare following joint replacement surgery: Secondary | ICD-10-CM | POA: Diagnosis not present

## 2018-01-11 HISTORY — DX: Other chronic pain: G89.29

## 2018-01-11 HISTORY — DX: Low back pain, unspecified: M54.50

## 2018-01-11 HISTORY — PX: TOTAL SHOULDER ARTHROPLASTY: SHX126

## 2018-01-11 HISTORY — DX: Low back pain: M54.5

## 2018-01-11 LAB — GLUCOSE, CAPILLARY: GLUCOSE-CAPILLARY: 85 mg/dL (ref 70–99)

## 2018-01-11 SURGERY — ARTHROPLASTY, SHOULDER, TOTAL
Anesthesia: General | Site: Shoulder | Laterality: Left

## 2018-01-11 MED ORDER — FENTANYL CITRATE (PF) 100 MCG/2ML IJ SOLN
INTRAMUSCULAR | Status: DC | PRN
  Administered 2018-01-11: 100 ug via INTRAVENOUS

## 2018-01-11 MED ORDER — PROPOFOL 10 MG/ML IV BOLUS
INTRAVENOUS | Status: DC | PRN
  Administered 2018-01-11: 180 mg via INTRAVENOUS

## 2018-01-11 MED ORDER — METOCLOPRAMIDE HCL 5 MG/ML IJ SOLN: 5.0000 mg | Freq: Three times a day (TID) | INTRAMUSCULAR | Status: DC | PRN

## 2018-01-11 MED ORDER — METHOCARBAMOL 1000 MG/10ML IJ SOLN
500.0000 mg | Freq: Four times a day (QID) | INTRAVENOUS | Status: DC | PRN
  Filled 2018-01-11: qty 5

## 2018-01-11 MED ORDER — METOCLOPRAMIDE HCL 5 MG PO TABS: 5.0000 mg | ORAL_TABLET | Freq: Three times a day (TID) | ORAL | Status: DC | PRN

## 2018-01-11 MED ORDER — BUPIVACAINE LIPOSOME 1.3 % IJ SUSP
INTRAMUSCULAR | Status: DC | PRN
  Administered 2018-01-11: 10 mL via PERINEURAL

## 2018-01-11 MED ORDER — SUGAMMADEX SODIUM 200 MG/2ML IV SOLN
INTRAVENOUS | Status: DC | PRN
  Administered 2018-01-11: 180 mg via INTRAVENOUS

## 2018-01-11 MED ORDER — ACETAMINOPHEN 500 MG PO TABS
500.0000 mg | ORAL_TABLET | Freq: Four times a day (QID) | ORAL | Status: AC
  Administered 2018-01-11 – 2018-01-12 (×2): 500 mg via ORAL
  Filled 2018-01-11 (×4): qty 1

## 2018-01-11 MED ORDER — METHOCARBAMOL 500 MG PO TABS
500.0000 mg | ORAL_TABLET | Freq: Four times a day (QID) | ORAL | Status: DC | PRN
Start: 2018-01-11 — End: 2018-01-13
  Administered 2018-01-11 – 2018-01-13 (×2): 500 mg via ORAL
  Filled 2018-01-11 (×2): qty 1

## 2018-01-11 MED ORDER — HYDROMORPHONE HCL 1 MG/ML IJ SOLN
INTRAMUSCULAR | Status: AC
  Filled 2018-01-11: qty 1

## 2018-01-11 MED ORDER — TRAMADOL HCL 50 MG PO TABS
50.0000 mg | ORAL_TABLET | Freq: Four times a day (QID) | ORAL | Status: DC
  Administered 2018-01-12 – 2018-01-13 (×6): 50 mg via ORAL
  Filled 2018-01-11 (×6): qty 1

## 2018-01-11 MED ORDER — ASPIRIN EC 325 MG PO TBEC
325.0000 mg | DELAYED_RELEASE_TABLET | Freq: Every day | ORAL | Status: DC
  Administered 2018-01-11 – 2018-01-13 (×3): 325 mg via ORAL
  Filled 2018-01-11 (×3): qty 1

## 2018-01-11 MED ORDER — ONDANSETRON HCL 4 MG/2ML IJ SOLN
INTRAMUSCULAR | Status: DC | PRN
  Administered 2018-01-11 (×2): 4 mg via INTRAVENOUS

## 2018-01-11 MED ORDER — PROPOFOL 10 MG/ML IV BOLUS
INTRAVENOUS | Status: AC
  Filled 2018-01-11: qty 20

## 2018-01-11 MED ORDER — MIDAZOLAM HCL 2 MG/2ML IJ SOLN
INTRAMUSCULAR | Status: AC
  Filled 2018-01-11: qty 2

## 2018-01-11 MED ORDER — HYDROMORPHONE HCL 1 MG/ML IJ SOLN
INTRAMUSCULAR | Status: DC | PRN
  Administered 2018-01-11: .2 mg via INTRAVENOUS

## 2018-01-11 MED ORDER — MORPHINE SULFATE (PF) 2 MG/ML IV SOLN
0.5000 mg | INTRAVENOUS | Status: DC | PRN
  Administered 2018-01-12: 1 mg via INTRAVENOUS
  Filled 2018-01-11: qty 1

## 2018-01-11 MED ORDER — ONDANSETRON HCL 4 MG PO TABS: 4.0000 mg | ORAL_TABLET | Freq: Four times a day (QID) | ORAL | Status: DC | PRN

## 2018-01-11 MED ORDER — ONDANSETRON HCL 4 MG/2ML IJ SOLN
4.0000 mg | Freq: Four times a day (QID) | INTRAMUSCULAR | Status: DC | PRN
  Administered 2018-01-11: 4 mg via INTRAVENOUS
  Filled 2018-01-11: qty 2

## 2018-01-11 MED ORDER — FENTANYL CITRATE (PF) 100 MCG/2ML IJ SOLN
100.0000 ug | Freq: Once | INTRAMUSCULAR | Status: AC
  Administered 2018-01-11: 100 ug via INTRAVENOUS

## 2018-01-11 MED ORDER — MIDAZOLAM HCL 2 MG/2ML IJ SOLN
2.0000 mg | Freq: Once | INTRAMUSCULAR | Status: AC
  Administered 2018-01-11: 2 mg via INTRAVENOUS

## 2018-01-11 MED ORDER — SENNOSIDES-DOCUSATE SODIUM 8.6-50 MG PO TABS: 1.0000 | ORAL_TABLET | Freq: Every evening | ORAL | Status: DC | PRN

## 2018-01-11 MED ORDER — KETOROLAC TROMETHAMINE 15 MG/ML IJ SOLN
7.5000 mg | Freq: Four times a day (QID) | INTRAMUSCULAR | Status: AC
  Administered 2018-01-11 – 2018-01-12 (×4): 7.5 mg via INTRAVENOUS
  Filled 2018-01-11 (×4): qty 1

## 2018-01-11 MED ORDER — HYDROCODONE-ACETAMINOPHEN 5-325 MG PO TABS: 1.0000 | ORAL_TABLET | ORAL | Status: DC | PRN

## 2018-01-11 MED ORDER — DOCUSATE SODIUM 100 MG PO CAPS
100.0000 mg | ORAL_CAPSULE | Freq: Two times a day (BID) | ORAL | Status: DC
  Administered 2018-01-11 – 2018-01-13 (×4): 100 mg via ORAL
  Filled 2018-01-11 (×4): qty 1

## 2018-01-11 MED ORDER — SCOPOLAMINE 1 MG/3DAYS TD PT72
1.0000 | MEDICATED_PATCH | TRANSDERMAL | Status: DC
  Administered 2018-01-11: 1.5 mg via TRANSDERMAL
  Filled 2018-01-11: qty 1

## 2018-01-11 MED ORDER — FENTANYL CITRATE (PF) 100 MCG/2ML IJ SOLN
INTRAMUSCULAR | Status: AC
  Filled 2018-01-11: qty 2

## 2018-01-11 MED ORDER — SODIUM CHLORIDE 0.9 % IR SOLN
Status: DC | PRN
  Administered 2018-01-11: 3000 mL

## 2018-01-11 MED ORDER — PHENOL 1.4 % MT LIQD
1.0000 | OROMUCOSAL | Status: DC | PRN
Start: 2018-01-11 — End: 2018-01-13

## 2018-01-11 MED ORDER — HYDROCODONE-ACETAMINOPHEN 7.5-325 MG PO TABS
1.0000 | ORAL_TABLET | ORAL | Status: DC | PRN
Start: 2018-01-11 — End: 2018-01-13
  Administered 2018-01-13: 2 via ORAL
  Filled 2018-01-11: qty 2

## 2018-01-11 MED ORDER — 0.9 % SODIUM CHLORIDE (POUR BTL) OPTIME
TOPICAL | Status: DC | PRN
  Administered 2018-01-11: 1000 mL

## 2018-01-11 MED ORDER — FAMOTIDINE 20 MG PO TABS
20.0000 mg | ORAL_TABLET | Freq: Every day | ORAL | Status: DC
  Administered 2018-01-11 – 2018-01-13 (×3): 20 mg via ORAL
  Filled 2018-01-11 (×3): qty 1

## 2018-01-11 MED ORDER — ROCURONIUM BROMIDE 10 MG/ML (PF) SYRINGE
PREFILLED_SYRINGE | INTRAVENOUS | Status: DC | PRN
  Administered 2018-01-11: 45 mg via INTRAVENOUS

## 2018-01-11 MED ORDER — MIDAZOLAM HCL 5 MG/5ML IJ SOLN
INTRAMUSCULAR | Status: DC | PRN
  Administered 2018-01-11 (×2): 1 mg via INTRAVENOUS

## 2018-01-11 MED ORDER — FENTANYL CITRATE (PF) 250 MCG/5ML IJ SOLN
INTRAMUSCULAR | Status: AC
  Filled 2018-01-11: qty 5

## 2018-01-11 MED ORDER — CHLORHEXIDINE GLUCONATE 4 % EX LIQD: 60.0000 mL | Freq: Once | CUTANEOUS | Status: DC

## 2018-01-11 MED ORDER — CEFAZOLIN SODIUM-DEXTROSE 2-4 GM/100ML-% IV SOLN
2.0000 g | INTRAVENOUS | Status: DC
  Filled 2018-01-11: qty 100

## 2018-01-11 MED ORDER — DEXAMETHASONE SODIUM PHOSPHATE 10 MG/ML IJ SOLN
INTRAMUSCULAR | Status: DC | PRN
  Administered 2018-01-11: 10 mg via INTRAVENOUS

## 2018-01-11 MED ORDER — LACTATED RINGERS IV SOLN
INTRAVENOUS | Status: DC
  Administered 2018-01-11: 11:00:00 via INTRAVENOUS

## 2018-01-11 MED ORDER — MENTHOL 3 MG MT LOZG: 1.0000 | LOZENGE | OROMUCOSAL | Status: DC | PRN

## 2018-01-11 MED ORDER — CEFAZOLIN SODIUM-DEXTROSE 1-4 GM/50ML-% IV SOLN
1.0000 g | Freq: Four times a day (QID) | INTRAVENOUS | Status: AC
  Administered 2018-01-11 – 2018-01-12 (×3): 1 g via INTRAVENOUS
  Filled 2018-01-11 (×3): qty 50

## 2018-01-11 MED ORDER — ACETAMINOPHEN 10 MG/ML IV SOLN
INTRAVENOUS | Status: DC | PRN
  Administered 2018-01-11: 1000 mg via INTRAVENOUS

## 2018-01-11 MED ORDER — BUPIVACAINE HCL (PF) 0.5 % IJ SOLN
INTRAMUSCULAR | Status: DC | PRN
  Administered 2018-01-11: 14 mL via PERINEURAL

## 2018-01-11 MED ORDER — ACETAMINOPHEN 10 MG/ML IV SOLN
INTRAVENOUS | Status: AC
  Filled 2018-01-11: qty 100

## 2018-01-11 MED ORDER — ACETAMINOPHEN 325 MG PO TABS
325.0000 mg | ORAL_TABLET | Freq: Four times a day (QID) | ORAL | Status: DC | PRN
  Filled 2018-01-11: qty 2

## 2018-01-11 MED ORDER — CEFAZOLIN SODIUM-DEXTROSE 2-3 GM-%(50ML) IV SOLR
INTRAVENOUS | Status: DC | PRN
  Administered 2018-01-11: 2 g via INTRAVENOUS

## 2018-01-11 MED ORDER — LIDOCAINE 2% (20 MG/ML) 5 ML SYRINGE
INTRAMUSCULAR | Status: DC | PRN
  Administered 2018-01-11: 40 mg via INTRAVENOUS

## 2018-01-11 SURGICAL SUPPLY — 57 items
ALCOHOL 70% 16 OZ (MISCELLANEOUS) ×2 IMPLANT
BIT DRILL 5/64X5 DISP (BIT) ×1 IMPLANT
BLADE SAG 18X100X1.27 (BLADE) ×2 IMPLANT
CALIBRATOR GLENOID VIP 5-D (SYSTAGENIX WOUND MANAGEMENT) ×1 IMPLANT
CEMENT BONE R 1X40 (Cement) ×2 IMPLANT
CLSR STERI-STRIP ANTIMIC 1/2X4 (GAUZE/BANDAGES/DRESSINGS) ×1 IMPLANT
COVER SURGICAL LIGHT HANDLE (MISCELLANEOUS) ×2 IMPLANT
DRAPE INCISE IOBAN 66X45 STRL (DRAPES) ×2 IMPLANT
DRAPE ORTHO SPLIT 77X108 STRL (DRAPES) ×4
DRAPE SURG ORHT 6 SPLT 77X108 (DRAPES) ×2 IMPLANT
DRAPE U-SHAPE 47X51 STRL (DRAPES) ×2 IMPLANT
DRSG ADAPTIC 3X8 NADH LF (GAUZE/BANDAGES/DRESSINGS) ×1 IMPLANT
DRSG AQUACEL AG ADV 3.5X10 (GAUZE/BANDAGES/DRESSINGS) ×1 IMPLANT
DRSG PAD ABDOMINAL 8X10 ST (GAUZE/BANDAGES/DRESSINGS) ×1 IMPLANT
DURAPREP 26ML APPLICATOR (WOUND CARE) ×2 IMPLANT
ELECT BLADE 4.0 EZ CLEAN MEGAD (MISCELLANEOUS) ×2
ELECT REM PT RETURN 9FT ADLT (ELECTROSURGICAL) ×2
ELECTRODE BLDE 4.0 EZ CLN MEGD (MISCELLANEOUS) ×1 IMPLANT
ELECTRODE REM PT RTRN 9FT ADLT (ELECTROSURGICAL) ×1 IMPLANT
GAUZE SPONGE 4X4 12PLY STRL (GAUZE/BANDAGES/DRESSINGS) ×1 IMPLANT
GLENOID WITH CLEAT MEDIUM (Shoulder) ×1 IMPLANT
GLOVE BIO SURGEON STRL SZ7.5 (GLOVE) ×2 IMPLANT
GLOVE BIOGEL PI IND STRL 8 (GLOVE) ×1 IMPLANT
GLOVE BIOGEL PI INDICATOR 8 (GLOVE) ×1
GOWN STRL REUS W/ TWL LRG LVL3 (GOWN DISPOSABLE) IMPLANT
GOWN STRL REUS W/ TWL XL LVL3 (GOWN DISPOSABLE) ×2 IMPLANT
GOWN STRL REUS W/TWL LRG LVL3 (GOWN DISPOSABLE)
GOWN STRL REUS W/TWL XL LVL3 (GOWN DISPOSABLE) ×4
HEAD HUMERAL USP II 44/17 (Head) ×1 IMPLANT
KIT BASIN OR (CUSTOM PROCEDURE TRAY) ×2 IMPLANT
KIT SUTURE APEX UNIVERSE (KITS) ×1 IMPLANT
KIT TURNOVER KIT B (KITS) ×2 IMPLANT
MANIFOLD NEPTUNE II (INSTRUMENTS) ×2 IMPLANT
NS IRRIG 1000ML POUR BTL (IV SOLUTION) ×2 IMPLANT
PACK SHOULDER (CUSTOM PROCEDURE TRAY) ×2 IMPLANT
PAD ARMBOARD 7.5X6 YLW CONV (MISCELLANEOUS) ×4 IMPLANT
SET PIN UNIVERSAL REVERSE (SET/KITS/TRAYS/PACK) ×1 IMPLANT
SLING ARM FOAM STRAP LRG (SOFTGOODS) IMPLANT
SLING ARM FOAM STRAP MED (SOFTGOODS) IMPLANT
SLING ARM IMMOBILIZER LRG (SOFTGOODS) ×1 IMPLANT
SMARTMIX MINI TOWER (MISCELLANEOUS) ×2
SPONGE LAP 18X18 RF (DISPOSABLE) ×1 IMPLANT
SPONGE LAP 18X18 X RAY DECT (DISPOSABLE) ×2 IMPLANT
SPONGE LAP 4X18 RFD (DISPOSABLE) IMPLANT
STEM HUMERAL UNI APEX 10MM (Shoulder) ×1 IMPLANT
STRIP CLOSURE SKIN 1/2X4 (GAUZE/BANDAGES/DRESSINGS) ×1 IMPLANT
SUCTION FRAZIER HANDLE 10FR (MISCELLANEOUS) ×1
SUCTION TUBE FRAZIER 10FR DISP (MISCELLANEOUS) ×1 IMPLANT
SUT FIBERWIRE #2 38 T-5 BLUE (SUTURE) ×4
SUT MNCRL AB 4-0 PS2 18 (SUTURE) ×2 IMPLANT
SUT VIC AB 2-0 CT1 27 (SUTURE) ×2
SUT VIC AB 2-0 CT1 TAPERPNT 27 (SUTURE) ×1 IMPLANT
SUTURE FIBERWR #2 38 T-5 BLUE (SUTURE) ×2 IMPLANT
TOWEL OR 17X26 10 PK STRL BLUE (TOWEL DISPOSABLE) ×2 IMPLANT
TOWER SMARTMIX MINI (MISCELLANEOUS) IMPLANT
WATER STERILE IRR 1000ML POUR (IV SOLUTION) ×2 IMPLANT
YANKAUER SUCT BULB TIP NO VENT (SUCTIONS) ×2 IMPLANT

## 2018-01-11 NOTE — Anesthesia Procedure Notes (Signed)
Procedure Name: Intubation Date/Time: 01/11/2018 1:40 PM Performed by: Cleda Daub, CRNA Pre-anesthesia Checklist: Patient identified, Emergency Drugs available, Suction available and Patient being monitored Patient Re-evaluated:Patient Re-evaluated prior to induction Oxygen Delivery Method: Circle system utilized Preoxygenation: Pre-oxygenation with 100% oxygen Induction Type: IV induction Ventilation: Mask ventilation without difficulty and Mask ventilation throughout procedure Laryngoscope Size: Mac and 3 Grade View: Grade I Tube type: Oral Tube size: 7.0 mm Number of attempts: 1 Airway Equipment and Method: Stylet Placement Confirmation: ETT inserted through vocal cords under direct vision,  positive ETCO2 and breath sounds checked- equal and bilateral Secured at: 22 cm Tube secured with: Tape Dental Injury: Teeth and Oropharynx as per pre-operative assessment

## 2018-01-11 NOTE — Transfer of Care (Signed)
Immediate Anesthesia Transfer of Care Note  Patient: Stacey [name]  Procedure(s) Performed: LEFT TOTAL SHOULDER ARTHROPLASTY (Left Shoulder)  Patient Location: PACU  Anesthesia Type:GA combined with regional for post-op pain  Level of Consciousness: drowsy and patient cooperative  Airway & Oxygen Therapy: Patient Spontanous Breathing and Patient connected to face mask oxygen  Post-op Assessment: Report given to RN and Post -op Vital signs reviewed and stable  Post vital signs: Reviewed and stable  Last Vitals:  Vitals Value Taken Time  BP 162/64 01/11/2018  4:16 PM  Temp    Pulse 67 01/11/2018  4:21 PM  Resp 13 01/11/2018  4:21 PM  SpO2 100 % 01/11/2018  4:21 PM  Vitals shown include unvalidated device data.  Last Pain:  Vitals:   01/11/18 1615  TempSrc:   PainSc: (P) 0-No pain      Patients Stated Pain Goal: 3 (10/62/69 4854)  Complications: No apparent anesthesia complications

## 2018-01-11 NOTE — H&P (Signed)
ORTHOPAEDIC H and P  REQUESTING PHYSICIAN: Nicholes Stairs, MD  PCP:  System, Pcp Not In  Chief Complaint: Left shoulder arthritis  HPI: Stacey [name] is a 57 y.o. female who complains of worsening left shoulder pain despite many months of conservative management.  She presents today for total shoulder arthroplasty.  She has been seen recently in the office and has no new complaints today.  All questions previously answered in the clinic.   Past Medical History:  Diagnosis Date  . Arthritis    shoulder, neck, lower back  . Family history of adverse reaction to anesthesia    Mother has nausea  . GERD (gastroesophageal reflux disease)   . PONV (postoperative nausea and vomiting)   . Pre-diabetes    Past Surgical History:  Procedure Laterality Date  . APPENDECTOMY     Social History   Socioeconomic History  . Marital status: Married    Spouse name: Not on file  . Number of children: Not on file  . Years of education: Not on file  . Highest education level: Not on file  Occupational History  . Not on file  Social Needs  . Financial resource strain: Not on file  . Food insecurity:    Worry: Not on file    Inability: Not on file  . Transportation needs:    Medical: Not on file    Non-medical: Not on file  Tobacco Use  . Smoking status: Never Smoker  . Smokeless tobacco: Never Used  Substance and Sexual Activity  . Alcohol use: Never    Frequency: Never  . Drug use: Never  . Sexual activity: Not on file  Lifestyle  . Physical activity:    Days per week: Not on file    Minutes per session: Not on file  . Stress: Not on file  Relationships  . Social connections:    Talks on phone: Not on file    Gets together: Not on file    Attends religious service: Not on file    Active member of club or organization: Not on file    Attends meetings of clubs or organizations: Not on file    Relationship status: Not on file  Other Topics Concern  . Not on file    Social History Narrative  . Not on file   Family History  Problem Relation Age of Onset  . Hypertension Mother   . Heart disease Father   . Heart attack Father   . CAD Father    Allergies  Allergen Reactions  . Anesthesia S-I-60 Nausea Only   Prior to Admission medications   Medication Sig Start Date End Date Taking? Authorizing Provider  calcium carbonate (TUMS) 500 MG chewable tablet Chew 1 tablet by mouth daily as needed for indigestion or heartburn.   Yes [provider]  methocarbamol (ROBAXIN) 500 MG tablet Take 500 mg by mouth daily as needed for muscle spasms.  10/15/17  Yes [provider]  ranitidine (ZANTAC) 150 MG tablet Take 150 mg by mouth daily as needed for heartburn.   Yes [provider]  traMADol (ULTRAM) 50 MG tablet Take 50 mg by mouth every 6 (six) hours as needed. 11/25/17  Yes [provider]   No results found.  Positive ROS: All other systems have been reviewed and were otherwise negative with the exception of those mentioned in the HPI and as above.  Physical Exam: General: Alert, no acute distress Cardiovascular: No pedal edema Respiratory:  No cyanosis, no use of accessory musculature GI: No organomegaly, abdomen is soft and non-tender Skin: No lesions in the area of chief complaint Neurologic: Sensation intact distally Psychiatric: Patient is competent for consent with normal mood and affect Lymphatic: No axillary or cervical lymphadenopathy    Assessment: Left shoulder osteoarthritis  Plan: - Plan for total shoulder arthroplasty to left arm. - All questions solicited and answered to her satisfaction.  The risks, benefits, and alternatives were discussed with the patient. There are risks associated with the surgery including, but not limited to, problems with anesthesia (death), infection, differences in arm length/angulation/rotation, fracture of bones, loosening or failure of implants, malunion, nonunion,  hematoma (blood accumulation) which may require surgical drainage, blood clots, pulmonary embolism, nerve injury, dislocation and blood vessel injury. The patient understands these risks and elects to proceed. -  Plan for admission post operatively for routine post operative care.    Nicholes Stairs, MD Cell [phone]    01/11/2018 12:17 PM

## 2018-01-11 NOTE — Anesthesia Procedure Notes (Signed)
Anesthesia Regional Block: Interscalene brachial plexus block   Pre-Anesthetic Checklist: ,, timeout performed, Correct Patient, Correct Site, Correct Laterality, Correct Procedure, Correct Position, site marked, Risks and benefits discussed, at surgeon's request and post-op pain management  Laterality: Upper and Left  Prep: Betadine, chloraprep, alcohol swabs       Needles:  Injection technique: Single-shot  Needle Type: Stimulator Needle - 40      Needle Gauge: 22     Additional Needles:   Procedures:, nerve stimulator,,,,,,,  Narrative:  Start time: 01/11/2018 11:40 AM End time: 01/11/2018 11:53 AM  Performed by: Personally  Anesthesiologist: Barnet Glasgow, MD  Additional Notes: Block assessed prior to start of surgery

## 2018-01-11 NOTE — Brief Op Note (Signed)
01/11/2018  4:10 PM  PATIENT:  Stacey [name]  57 y.o. female  PRE-OPERATIVE DIAGNOSIS:  Left shoulder osteoarthritis  POST-OPERATIVE DIAGNOSIS:  Left shoulder osteoarthritis  PROCEDURE:  Procedure(s) with comments: LEFT TOTAL SHOULDER ARTHROPLASTY (Left) - 2.5 hrs  SURGEON:  Surgeon(s) and Role:    * Nicholes Stairs, MD - Primary  PHYSICIAN ASSISTANT:   ASSISTANTS: April, Green RNFA   ANESTHESIA:   regional and general  EBL:  150 mL   BLOOD ADMINISTERED:none  DRAINS: none   LOCAL MEDICATIONS USED:  NONE  SPECIMEN:  No Specimen  DISPOSITION OF SPECIMEN:  N/A  COUNTS:  YES  TOURNIQUET:  * No tourniquets in log *  DICTATION: .Dragon Dictation  PLAN OF CARE: Admit to inpatient   PATIENT DISPOSITION:  PACU - hemodynamically stable.   Delay start of Pharmacological VTE agent (>24hrs) due to surgical blood loss or risk of bleeding: not applicable

## 2018-01-11 NOTE — Progress Notes (Signed)
Pt new admit from PACU s/p left shoulder arthroplasty with sling and ice pack, alert and oriented, can move her left fingers, non weight bearing, wound site with mepilex no bleeding noted, family at the bedside, no complain of pain at this time, due to void.

## 2018-01-11 NOTE — Op Note (Signed)
Date of Surgery: 01/11/2018  INDICATIONS: Ms. Maclaren is a 57 y.o.-year-old female with a left Shoulder osteoarthritis.  She has experienced many months of increasingly debilitating pain in the operative extremity.  She has failed treatment with intra-articular and subacromial cortisone injections as well as physical therapy and oral medications.  She has opted for total shoulder replacement.;  The patient did consent to the procedure after discussion of the risks and benefits.  PREOPERATIVE DIAGNOSIS: Left shoulder osteoarthritis  POSTOPERATIVE DIAGNOSIS: Same.  PROCEDURE: Left total shoulder replacement  SURGEON: Geralynn Rile, M.D.  ASSIST: April Green, RNFA.  ANESTHESIA:  general, And interscalene  IV FLUIDS AND URINE: See anesthesia.  ESTIMATED BLOOD LOSS: 150 mL.  IMPLANTS:  Arthrex apex humeral stem size 10 mm Arthrex universe humeral head 44 x 17 mm Arthrex universe Vault Lock glenoid size medium Arthrex apex suture kit  DRAINS: None  COMPLICATIONS: None.  DESCRIPTION OF PROCEDURE: After obtaining informed consent,The patient was brought to the operating room and placed Supine on the operating table.  The patient had been signed prior to the procedure and this was documented. The patient had the anesthesia placed by the anesthesiologist.  A time-out was performed to confirm that this was the correct patient, site, side and location. The patient did receive antibiotics prior to the incision and was re-dosed during the procedure as needed at indicated intervals.  The patient was placed in the beachchair position after confirmation of adequate mean arterial pressure. The patient had the operative extremity prepped and draped in the standard surgical fashion.   The head and neck were nicely stabilized.   A 10 blade was used to make a standard deltopectoral approach to the shoulder.  Dissection was carried out through the subcutaneous tissue to where the deltopectoral interval was  visualized and developed.  The cephalic vein was mobilized and retracted medially for the duration of the case.  The subacromial space was cleared bluntly retractors were placed appropriately deep to the pectoralis major tendon and deep to the deltoid muscle fascia.  The upper one third of the pectoralis muscle insertion on the humerus was sharply released.  There was found to be a small rent in the supraspinatus but otherwise the rotator cuff tendons were intact.  Next I performed a subscapularis takedown and a peel fashion.  Subscapularis, rotator interval, and the inferior capsule were all released.  There were inferior humeral osteophytes that were removed with rongeur and curved osteotome.  After, Releases, a humeral head osteotomy was performed in 30 of retroversion and at 135 head neck angle.  A protractor was placed within the glenoid vault to protect axillary nerve and posterior capsular structures.  Next we prepared the humerus.  Humeral canal was first reamed up to a size 7 reamer.  Next maintaining 30 of retroversion we broached up to a size 10 mm Apex stem And this did have excellent press fit and rotational stability.  We then placed a humeral head protector and turned our attention to preparing the glenoid.  Retractors were placed anteriorly, superiorly, and posteriorly and the labrum was excised.  Exposure of the glenoid was obtained.  The center drill bit was used followed by sequential reaming.  Next using the pegged glenoid guide the holes were drilled appropriately.  The Medium size glenoid proved to be the appropriate size for this glenoid.  Next the holes were drilled and chiseled appropriately.  Glenoid  was washed with antibiotic irrigation.  Next glenoid vault was dried.  We  trialed once again and were satisfied with the Medium sized the glenoid.  Next, using antibodies cementing the superior and inferior holes the glenoid was cemented into place.  Of note, the center ball-like peg  was filled with humeral head autograft and not cemented per the manufacturer's recommendation.  Excellent fixation of the glenoid was obtained.   We then turned our attention back to the humeral side.  The humeral stem was found to still be rotationally stable and the final stem was opened at the above size.  2 drill holes were placed in the bicipital tuberosity for subscapularis repair.  The apex suture fixation technique was prepared on the back table.  The stem was malleted into place with excellent fixation.  The Arthrex guide was then used to secure the collar at the appropriate inclination on the humeral stem.  We then trialed humeral heads and found the 44 x 17 mm humeral head to be satisfactory.  This demonstrated posterior pushback of just at 50% with spontaneous reduction and likewise inferior pull down of 50% with spontaneous reduction.  That head was then removed and the final humeral head was malleted in place with the above size.   Lastly we turned our attention to the subscapularis repair.  The shoulder was copiously irrigated once again with antibiotic solution.  The apex subscapularis repair technique was then performed.  Multiple sutures were then passed around the stem prior to tapping it into place.  Using the apex technique all sutures were sequentially tied per the manufacturer recommendation.  The medial knots were secured, giving excellent repair to the subscapularis.  A #2 FiberWire was then used to close the rotator interval.  This was done in 45 of abduction and external rotation.  Patient's biceps tendon was abraded and fixed to the pectoralis major insertion with a #2 FiberWire in a figure-of-eight fashion.  The wound was once again copious bleed irrigated using antibiotic solution and then normal saline.  We evaluated once again for hemostasis.  The wound was closed in layers with a #2 FiberWire in the deltopectoral interval.  2-0 Vicryl for the deep dermal layer and 3-0 Monocryl  in a subcuticular fashion followed by Dermabond glue.  A standard sterile occlusive dressing was applied as well as a postoperative sling.   The patient tolerated the procedure well.  All counts were correct 2.  There were no intraoperative complications.  The patient was transferred to PACU in stable condition.   Postoperative plan: The patient will be nonweightbearing to the operative extremity for proximally 4-6 weeks.  He will begin physical therapy in the outpatient setting.  He will be admitted for routine postoperative care including antibiotics, pain control, and DVT prophylaxis.  Sling will be maintained at all times.  Return for wound check in approximately 2 weeks and then again at 6 weeks.

## 2018-01-12 ENCOUNTER — Encounter (HOSPITAL_COMMUNITY): Payer: Self-pay | Admitting: Orthopedic Surgery

## 2018-01-12 MED ORDER — OXYCODONE HCL 5 MG PO TABS: 5.0000 mg | ORAL_TABLET | Freq: Four times a day (QID) | ORAL | 0 refills | Status: AC | PRN

## 2018-01-12 NOTE — Discharge Instructions (Signed)
Maintain sling to left arm unless instructed otherwise by your therapist, okay to do hand, wrist, and elbow range of motion as tolerated.  Okay to remove and keep your arm by her side when you shower. -Maintain your postoperative bandage until your follow-up appointment with Dr. Stann Mainland.  It is okay to shower with this bandage in place. - no lifting with the left arm. -For mild to moderate pain utilize Tylenol and/or Motrin.  For breakthrough pain use oxycodone as necessary. -For the prevention of blood clots use once daily aspirin for 2 weeks. -Apply ice to the left shoulder for 20 to 30 minutes at a time out of each hour that you are awake. -Return to see Dr. Stann Mainland in 2 weeks for postoperative follow-up.

## 2018-01-12 NOTE — Progress Notes (Signed)
PT Cancellation Note  Patient Details Name: Stacey [name] MRN: [id number] DOB: 05-30-1961   Cancelled Treatment:    Reason Eval/Treat Not Completed: PT screened, no needs identified, will sign off. Per OT, pt currently mod indep with mobility. No questions or concerns for PT.  Mabeline Caras, PT, DPT Acute Rehab Services  Pager: [place] 01/12/2018, 9:37 AM

## 2018-01-12 NOTE — Evaluation (Signed)
Occupational Therapy Evaluation Patient Details Name: Stacey [name] MRN: [id number] DOB: October 29, 1960 Today's Date: 01/12/2018    History of Present Illness s/p L TSA   Clinical Impression   Pt and husband educated in compensatory strategies for ADL, shoulder precautions, sling use and positioning L UE in bed and chair. Pt with nerve block still intact. Will return to educate in elbow>hand exercises. Ice applied to shoulder.    Follow Up Recommendations  Follow surgeon's recommendation for DC plan and follow-up therapies    Equipment Recommendations  None recommended by OT    Recommendations for Other Services       Precautions / Restrictions Precautions Precautions: Shoulder Type of Shoulder Precautions: no shoulder ROM, AROM L elbow to hand only Shoulder Interventions: Shoulder sling/immobilizer;Off for dressing/bathing/exercises Precaution Booklet Issued: Yes (comment) Required Braces or Orthoses: Sling Restrictions Weight Bearing Restrictions: Yes LUE Weight Bearing: Non weight bearing      Mobility Bed Mobility               General bed mobility comments: pt in chair  Transfers Overall transfer level: Modified independent(slightly slow) Equipment used: None                  Balance Overall balance assessment: Independent                                         ADL either performed or assessed with clinical judgement   ADL Overall ADL's : Modified independent                                       General ADL Comments: Educated pt and spouse in compensatory strategies for ADL, positioning L UE in bed and chair, sling wearing schedule an proper position and NWB status of L UE     Vision Baseline Vision/History: Wears glasses Wears Glasses: Reading only Patient Visual Report: No change from baseline       Perception     Praxis      Pertinent Vitals/Pain Pain Assessment: No/denies pain     Hand  Dominance Right   Extremity/Trunk Assessment Upper Extremity Assessment Upper Extremity Assessment: LUE deficits/detail LUE Deficits / Details: full PROM elbow to hand, can move fingers slightly actively, nerve block intact LUE: Unable to fully assess due to immobilization LUE Coordination: decreased fine motor;decreased gross motor   Lower Extremity Assessment Lower Extremity Assessment: Overall WFL for tasks assessed   Cervical / Trunk Assessment Cervical / Trunk Assessment: Normal   Communication Communication Communication: No difficulties   Cognition Arousal/Alertness: Awake/alert(nerve block still intact) Behavior During Therapy: WFL for tasks assessed/performed Overall Cognitive Status: Within Functional Limits for tasks assessed                                     General Comments       Exercises     Shoulder Instructions      Home Living Family/patient expects to be discharged to:: Private residence Living Arrangements: Spouse/significant other Available Help at Discharge: Family;Available 24 hours/day  Prior Functioning/Environment Level of Independence: Independent                 OT Problem List: Decreased coordination;Decreased range of motion      OT Treatment/Interventions: Therapeutic exercise;Therapeutic activities;Self-care/ADL training    OT Goals(Current goals can be found in the care plan section) Acute Rehab OT Goals Patient Stated Goal: to regain use of L UE OT Goal Formulation: With patient Time For Goal Achievement: 01/26/18 Potential to Achieve Goals: Good  OT Frequency: Min 2X/week   Barriers to D/C:            Co-evaluation              AM-PAC PT "6 Clicks" Daily Activity     Outcome Measure Help from another person eating meals?: None Help from another person taking care of personal grooming?: None Help from another person toileting, which includes  using toliet, bedpan, or urinal?: None Help from another person bathing (including washing, rinsing, drying)?: None Help from another person to put on and taking off regular upper body clothing?: None Help from another person to put on and taking off regular lower body clothing?: None 6 Click Score: 24   End of Session Equipment Utilized During Treatment: Other (comment)(sling)  Activity Tolerance: Patient tolerated treatment well Patient left: in chair;with call bell/phone within reach;with family/visitor present  OT Visit Diagnosis: Muscle weakness (generalized) (M62.81)                Time: 7048-8891 OT Time Calculation (min): 15 min Charges:  OT General Charges $OT Visit: 1 Visit OT Evaluation $OT Eval Low Complexity: 1 Low G-Codes:     02/10/18 Nestor Lewandowsky, OTR/L Pager: [place], Haze Boyden 02/10/2018, 9:32 AM

## 2018-01-12 NOTE — Progress Notes (Signed)
Occupational Therapy Treatment Patient Details Name: Stacey [name] MRN: [id number] DOB: 1960-10-19 Today's Date: 01/12/2018    History of present illness s/p L TSA   OT comments  Pt seen for second session to perform elbow to hand exercises and educate pt and family in donning and doffing sling.  Follow Up Recommendations  Follow surgeon's recommendation for DC plan and follow-up therapies    Equipment Recommendations  None recommended by OT    Recommendations for Other Services      Precautions / Restrictions Precautions Precautions: Shoulder Type of Shoulder Precautions: no shoulder ROM, AROM L elbow to hand only Shoulder Interventions: Shoulder sling/immobilizer;Off for dressing/bathing/exercises Required Braces or Orthoses: Sling Restrictions Weight Bearing Restrictions: Yes LUE Weight Bearing: Non weight bearing       Mobility Bed Mobility                  Transfers                      Balance                                           ADL either performed or assessed with clinical judgement   ADL                                         General ADL Comments: educated in donning and doffing sling     Vision       Perception     Praxis      Cognition Arousal/Alertness: Awake/alert Behavior During Therapy: WFL for tasks assessed/performed Overall Cognitive Status: Within Functional Limits for tasks assessed                                          Exercises Exercises: General Upper Extremity General Exercises - Upper Extremity Elbow Flexion: AAROM;Left;10 reps;Seated Elbow Extension: AAROM;Left;10 reps;Seated Wrist Flexion: AROM;Left;10 reps;Seated Wrist Extension: AROM;Left;10 reps;Seated Digit Composite Flexion: AROM;AAROM;Left;10 reps;Seated Composite Extension: AROM;Left;10 reps;Seated   Shoulder Instructions       General Comments      Pertinent Vitals/ Pain        Pain Assessment: No/denies pain(nerve block intact)  Home Living                                          Prior Functioning/Environment              Frequency  Min 2X/week        Progress Toward Goals  OT Goals(current goals can now be found in the care plan section)  Progress towards OT goals: Progressing toward goals  Acute Rehab OT Goals Patient Stated Goal: to regain use of L UE OT Goal Formulation: With patient Time For Goal Achievement: 01/26/18 Potential to Achieve Goals: Good  Plan Discharge plan remains appropriate    Co-evaluation                 AM-PAC PT "6 Clicks" Daily Activity     Outcome Measure   Help from another person eating meals?: None  Help from another person taking care of personal grooming?: None Help from another person toileting, which includes using toliet, bedpan, or urinal?: None Help from another person bathing (including washing, rinsing, drying)?: None Help from another person to put on and taking off regular upper body clothing?: None Help from another person to put on and taking off regular lower body clothing?: None 6 Click Score: 24    End of Session    OT Visit Diagnosis: Muscle weakness (generalized) (M62.81)   Activity Tolerance Patient tolerated treatment well   Patient Left in chair;with call bell/phone within reach;with family/visitor present   Nurse Communication          Time: 1129-1140 OT Time Calculation (min): 11 min  Charges: OT General Charges $OT Visit: 1 Visit OT Treatments $Therapeutic Exercise: 8-22 mins  01/12/2018 Nestor Lewandowsky, OTR/L Pager: [place], Haze Boyden 01/12/2018, 2:06 PM

## 2018-01-12 NOTE — Anesthesia Postprocedure Evaluation (Signed)
Anesthesia Post Note  Patient: Stacey [name]  Procedure(s) Performed: LEFT TOTAL SHOULDER ARTHROPLASTY (Left Shoulder)     Patient location during evaluation: PACU Anesthesia Type: General Level of consciousness: awake and alert Pain management: pain level controlled Vital Signs Assessment: post-procedure vital signs reviewed and stable Respiratory status: spontaneous breathing, nonlabored ventilation, respiratory function stable and patient connected to nasal cannula oxygen Cardiovascular status: blood pressure returned to baseline and stable Postop Assessment: no apparent nausea or vomiting Anesthetic complications: no    Last Vitals:  Vitals:   01/11/18 2025 01/12/18 0517  BP: 129/80 (!) 100/57  Pulse: 62 (!) 59  Resp: 16 16  Temp: 36.8 C 36.8 C  SpO2: 99% 97%    Last Pain:  Vitals:   01/12/18 0640  TempSrc:   PainSc: 2                  Barnet Glasgow

## 2018-01-12 NOTE — Progress Notes (Signed)
   Subjective:  Patient reports pain as none, nerve block still dense.  No complaints of n/v or SOB, CP.  Objective:   VITALS:   Vitals:   01/11/18 1727 01/11/18 2025 01/12/18 0517 01/12/18 1129  BP: (!) 159/80 129/80 (!) 100/57 109/62  Pulse: 68 62 (!) 59 65  Resp: 13 16 16    Temp: 97.6 F (36.4 C) 98.2 F (36.8 C) 98.3 F (36.8 C) 97.9 F (36.6 C)  TempSrc: Oral Oral Oral Oral  SpO2: 99% 99% 97% 98%  Weight:      Height:        Intact pulses distally Incision: dressing C/D/I Compartment soft paresthesias distally in R/M/U, dense nerve block at [facility] and ax motoro intact with pin/ain/rad/med/uln  Lab Results  Component Value Date   WBC 5.1 01/03/2018   HGB 13.4 01/03/2018   HCT 42.3 01/03/2018   MCV 95.5 01/03/2018   PLT 198 01/03/2018   BMET    Component Value Date/Time   NA 138 01/03/2018 1416   NA 139 09/17/2012 2231   K 3.9 01/03/2018 1416   K 4.0 09/17/2012 2231   CL 103 01/03/2018 1416   CL 106 09/17/2012 2231   CO2 25 01/03/2018 1416   CO2 29 09/17/2012 2231   GLUCOSE 92 01/03/2018 1416   GLUCOSE 131 (H) 09/17/2012 2231   BUN 8 01/03/2018 1416   BUN 13 09/17/2012 2231   CREATININE 0.71 01/03/2018 1416   CREATININE 0.94 09/17/2012 2231   CALCIUM 8.8 (L) 01/03/2018 1416   CALCIUM 8.9 09/17/2012 2231   GFRNONAA >60 01/03/2018 1416   GFRNONAA >60 09/17/2012 2231   GFRAA >60 01/03/2018 1416   GFRAA >60 09/17/2012 2231     Assessment/Plan: 1 Day Post-Op   Active Problems:   Osteoarthritis of left shoulder   S/P shoulder replacement, left  - NWB to LUE with Sling at all times other than bathing - maintain dressing until follow up. Ok to shower with this in place - will continue Inpatient to monitor pain following nerve block resolution.   - regular diet - SCDs and lovenox for DVT ppx - dc home tomorrow    Nicholes Stairs 01/12/2018, 2:04 PM   Geralynn Rile, MD [phone]

## 2018-01-13 NOTE — Progress Notes (Signed)
Patient ID: Stacey [name], female   DOB: 1960-10-10, 57 y.o.   MRN: [id number] Subjective: 2 Days Post-Op Procedure(s) (LRB): LEFT TOTAL SHOULDER ARTHROPLASTY (Left)    Patient reports pain as mild to moderate. No events. Ready to go home  Objective:   VITALS:   Vitals:   01/12/18 1406 01/13/18 0545  BP: (!) 111/59 112/65  Pulse: 62 66  Resp:  16  Temp: 98 F (36.7 C) 98.5 F (36.9 C)  SpO2: 99% 97%    Neurovascular intact Incision: dressing C/D/I, LUE in sling  LABS No results for input(s): HGB, HCT, WBC, PLT in the last 72 hours.  No results for input(s): NA, K, BUN, CREATININE, GLUCOSE in the last 72 hours.  No results for input(s): LABPT, INR in the last 72 hours.   Assessment/Plan: 2 Days Post-Op Procedure(s) (LRB): LEFT TOTAL SHOULDER ARTHROPLASTY (Left)   Plan: D/C to home today Oxycodone for pain Miralax and Colace to help reduce constipation  RTC in 2 weeks

## 2018-01-13 NOTE — Progress Notes (Signed)
Pt for discharge going home her husband at the bedside , health teachings, next appointment, due meds, prescriptions given,wound site dry and intact with mepilex, with left shoulder sling, also given her personal belongings, discontinued peripheral IV, given pain meds prior to discharge, assisted via wheelchair, no s/s of distress at this time.

## 2018-01-13 NOTE — Progress Notes (Signed)
Occupational Therapy Treatment Patient Details Name: Stacey [name] MRN: [id number] DOB: 1961-01-24 Today's Date: 01/13/2018    History of present illness s/p L TSA   OT comments  Pt presents sitting up in chair with spouse present agreeable to OT tx session. Session included review of shoulder precautions and sling management with spouse demonstrating proper donning of sling with min verbal cues. Reviewed LUE allowable movements/exercises (elbow to hand) with pt return demonstrating good understanding. Questions answered throughout session. Pt anticipating d/c home today.    Follow Up Recommendations  Follow surgeon's recommendation for DC plan and follow-up therapies    Equipment Recommendations  None recommended by OT          Precautions / Restrictions Precautions Precautions: Shoulder Type of Shoulder Precautions: no shoulder ROM, AROM L elbow to hand only Shoulder Interventions: Shoulder sling/immobilizer;Off for dressing/bathing/exercises Precaution Booklet Issued: Yes (comment) Required Braces or Orthoses: Sling Restrictions Weight Bearing Restrictions: Yes LUE Weight Bearing: Non weight bearing       Mobility Bed Mobility               General bed mobility comments: pt in chair  Transfers Overall transfer level: Modified independent Equipment used: None             General transfer comment: no physical assist needed, slow and guarded movements with sit<>stand     Balance Overall balance assessment: Independent                                         ADL either performed or assessed with clinical judgement   ADL Overall ADL's : Needs assistance/impaired                                       General ADL Comments: Pt dressed upon entering room, completing tasks with spouse assist this AM; reviewed sling management with both pt/pt's spouse, sling wear schedule, edema management, shoulder precautions, and elbow to  hand ROM                        Cognition Arousal/Alertness: Awake/alert Behavior During Therapy: WFL for tasks assessed/performed Overall Cognitive Status: Within Functional Limits for tasks assessed                                          Exercises Shoulder Exercises Elbow Flexion: AAROM;10 reps;Limitations;Left;Seated Elbow Flexion Limitations: very minimal flexion this session due to increased pain  Elbow Extension: AAROM;10 reps;Left;Seated;Limitations Elbow Extension Limitations: almost to full extension (approx 0-150*) Digit Composite Flexion: AROM;10 reps;Left;Seated Composite Extension: AROM;10 reps;Left;Seated Hand Exercises Forearm Supination: AROM;10 reps;Seated;Left Forearm Pronation: AROM;10 reps;Seated;Left   Shoulder Instructions Shoulder Instructions Donning/doffing sling/immobilizer: Moderate assistance;Caregiver independent with task;Patient able to independently direct caregiver Correct positioning of sling/immobilizer: Moderate assistance;Caregiver independent with task;Patient able to independently direct caregiver Sling wearing schedule (on at all times/off for ADL's): Independent     General Comments      Pertinent Vitals/ Pain       Pain Assessment: Faces Faces Pain Scale: Hurts little more Pain Location: LUE  Pain Descriptors / Indicators: Sore;Guarding Pain Intervention(s): Monitored during session;Limited activity within patient's tolerance  Home Living  Frequency  Min 2X/week        Progress Toward Goals  OT Goals(current goals can now be found in the care plan section)  Progress towards OT goals: Progressing toward goals  Acute Rehab OT Goals Patient Stated Goal: to regain use of L UE OT Goal Formulation: With patient Time For Goal Achievement: 01/26/18 Potential to Achieve Goals: Good  Plan Discharge plan remains appropriate                      AM-PAC PT "6 Clicks" Daily Activity     Outcome Measure   Help from another person eating meals?: None Help from another person taking care of personal grooming?: None Help from another person toileting, which includes using toliet, bedpan, or urinal?: None Help from another person bathing (including washing, rinsing, drying)?: None Help from another person to put on and taking off regular upper body clothing?: A Little Help from another person to put on and taking off regular lower body clothing?: None 6 Click Score: 23    End of Session Equipment Utilized During Treatment: Other (comment)(sling )  OT Visit Diagnosis: Muscle weakness (generalized) (M62.81)   Activity Tolerance Patient tolerated treatment well   Patient Left in chair;with call bell/phone within reach;with family/visitor present   Nurse Communication          Time: 1771-1657 OT Time Calculation (min): 23 min  Charges: OT General Charges $OT Visit: 1 Visit OT Treatments $Therapeutic Activity: 8-22 mins  Lou Cal, OT Pager [phone] 01/13/2018    Raymondo Band 01/13/2018, 10:54 AM

## 2018-01-18 DIAGNOSIS — M25612 Stiffness of left shoulder, not elsewhere classified: Secondary | ICD-10-CM | POA: Diagnosis not present

## 2018-01-18 DIAGNOSIS — M25512 Pain in left shoulder: Secondary | ICD-10-CM | POA: Diagnosis not present

## 2018-01-18 DIAGNOSIS — Z4789 Encounter for other orthopedic aftercare: Secondary | ICD-10-CM | POA: Diagnosis not present

## 2018-01-18 DIAGNOSIS — R29898 Other symptoms and signs involving the musculoskeletal system: Secondary | ICD-10-CM | POA: Diagnosis not present

## 2018-01-18 DIAGNOSIS — Z96612 Presence of left artificial shoulder joint: Secondary | ICD-10-CM | POA: Diagnosis not present

## 2018-01-18 NOTE — Discharge Summary (Signed)
Patient ID: Stacey [name] MRN: [id number] DOB/AGE: 10-23-1960 57 y.o.  Admit date: 01/11/2018 Discharge date: 01/13/2018  Primary Diagnosis: Left shoulder OA  Admission Diagnoses:  Past Medical History:  Diagnosis Date  . Arthritis    "left shoulder, neck, lower back"  (01/11/2018)  . Chronic lower back pain   . Family history of adverse reaction to anesthesia    Mother has nausea  . GERD (gastroesophageal reflux disease)   . PONV (postoperative nausea and vomiting)   . Pre-diabetes    Discharge Diagnoses:   Active Problems:   Osteoarthritis of left shoulder   S/P shoulder replacement, left  Estimated body mass index is 34.54 kg/m as calculated from the following:   Height as of this encounter: _0  (1.626 m).   Weight as of this encounter: 91.3 kg (201 lb 3.2 oz).  Procedure:  Procedure(s) (LRB): LEFT TOTAL SHOULDER ARTHROPLASTY (Left)   Consults: None  HPI: Stacey [name] is a right-hand-dominant 57 year old female with end-stage arthritis of her left shoulder.  She has elected to present prior to this discharge for elective left total shoulder replacement.  She has failed exhaustive conservative management.  She is having recalcitrant pain to those measures. Laboratory Data: Admission on 01/11/2018, Discharged on 01/13/2018  Component Date Value Ref Range Status  . Glucose-Capillary 01/11/2018 85  70 - 99 mg/dL Final  Hospital Outpatient Visit on 01/03/2018  Component Date Value Ref Range Status  . Glucose-Capillary 01/03/2018 81  65 - 99 mg/dL Final  . WBC 01/03/2018 5.1  4.0 - 10.5 K/uL Final  . RBC 01/03/2018 4.43  3.87 - 5.11 MIL/uL Final  . Hemoglobin 01/03/2018 13.4  12.0 - 15.0 g/dL Final  . HCT 01/03/2018 42.3  36.0 - 46.0 % Final  . MCV 01/03/2018 95.5  78.0 - 100.0 fL Final  . MCH 01/03/2018 30.2  26.0 - 34.0 pg Final  . MCHC 01/03/2018 31.7  30.0 - 36.0 g/dL Final  . RDW 01/03/2018 12.7  11.5 - 15.5 % Final  . Platelets 01/03/2018 198  150 - 400 K/uL Final   Performed at [facility], [place] [address] [place], [place] [address]  . Sodium 01/03/2018 138  135 - 145 mmol/L Final  . Potassium 01/03/2018 3.9  3.5 - 5.1 mmol/L Final   HEMOLYSIS AT THIS LEVEL MAY AFFECT RESULT  . Chloride 01/03/2018 103  101 - 111 mmol/L Final  . CO2 01/03/2018 25  22 - 32 mmol/L Final  . Glucose, Bld 01/03/2018 92  65 - 99 mg/dL Final  . BUN 01/03/2018 8  6 - 20 mg/dL Final  . Creatinine, Ser 01/03/2018 0.71  0.44 - 1.00 mg/dL Final  . Calcium 01/03/2018 8.8* 8.9 - 10.3 mg/dL Final  . GFR calc non Af Amer 01/03/2018 >60  >60 mL/min Final  . GFR calc Af Amer 01/03/2018 >60  >60 mL/min Final   Comment: (NOTE) The eGFR has been calculated using the CKD EPI equation. This calculation has not been validated in all clinical situations. eGFR's persistently <60 mL/min signify possible Chronic Kidney Disease.   . Anion gap 01/03/2018 10  5 - 15 Final   Performed at [facility], [place] [address] [place], [place] [address]  . MRSA, PCR 01/03/2018 NEGATIVE  NEGATIVE Final  . Staphylococcus aureus 01/03/2018 NEGATIVE  NEGATIVE Final   Comment: (NOTE) The Xpert SA Assay (FDA approved for NASAL specimens in patients 53 years of age and older), is one component of a comprehensive surveillance program.  It is not intended to diagnose infection nor to guide or monitor treatment. Performed at [facility], [place] [address] [place], [place] [address]   . Transfuse no blood products 01/03/2018    Final                   Value:TRANSFUSE NO BLOOD PRODUCTS, VERIFIED BY Lilia Pro, RN Performed at [facility], [place] [address] [place], [place] [address]      X-Rays:Dg Shoulder Left Port  Result Date: 01/11/2018 CLINICAL DATA:  Status post left shoulder replacement EXAM: LEFT SHOULDER - 1 VIEW COMPARISON:  None. FINDINGS: Left shoulder prosthesis is now noted in the proximal humerus. It is well seated in the glenoid. Some degenerative changes of the  glenoid are again seen. No acute bony abnormality is noted. IMPRESSION: Status post left shoulder replacement. Electronically Signed   By: Inez Catalina M.D.   On: 01/11/2018 17:05    EKG:No orders found for this or any previous visit.   Hospital Course: Stacey [name] is a 57 y.o. who was admitted to Hospital. They were brought to the operating room on 01/11/2018 and underwent Procedure(s): LEFT TOTAL SHOULDER ARTHROPLASTY.  Patient tolerated the procedure well and was later transferred to the recovery room and then to the orthopaedic floor for postoperative care.  They were given PO and IV analgesics for pain control following their surgery.  They were given 24 hours of postoperative antibiotics of  Anti-infectives (From admission, onward)   Start     Dose/Rate Route Frequency Ordered Stop   01/11/18 1930  ceFAZolin (ANCEF) IVPB 1 g/50 mL premix     1 g 100 mL/hr over 30 Minutes Intravenous Every 6 hours 01/11/18 1731 01/12/18 0716   01/11/18 1030  ceFAZolin (ANCEF) IVPB 2g/100 mL premix  Status:  Discontinued     2 g 200 mL/hr over 30 Minutes Intravenous On call to O.R. 01/11/18 1023 01/11/18 1722     and started on DVT prophylaxis in the form of Aspirin.   PT and OT were ordered for total joint protocol.  Patient had a good night on the evening of surgery.  They started to get up OOB with therapy on day one.  We did elect to keep her overnight on additional night due to concerns over pain status post the block wearing off.  Once the block resolves she had adequate pain control and elected to discharge home on postoperative day #2.  Patient was seen in rounds and was ready to go home.   Diet: Regular diet Activity:NWB Follow-up:in 2 weeks Disposition - Home Discharged Condition: good   Discharge Instructions    Call MD / Call 911   Complete by:  As directed    If you experience chest pain or shortness of breath, CALL 911 and be transported to the hospital emergency room.  If you  develope a fever above 101 F, pus (white drainage) or increased drainage or redness at the wound, or calf pain, call your surgeon's office.   Constipation Prevention   Complete by:  As directed    Drink plenty of fluids.  Prune juice may be helpful.  You may use a stool softener, such as Colace (over the counter) 100 mg twice a day.  Use MiraLax (over the counter) for constipation as needed.   Diet - low sodium heart healthy   Complete by:  As directed    Discharge instructions   Complete by:  As directed  Water proof dressing on left shoulder May shower when comfortable Ice to left shoulder miralax and colace twice a day while on pain meds to prevent constipation   Increase activity slowly as tolerated   Complete by:  As directed      Allergies as of 01/13/2018      Reactions   Anesthesia S-i-60 Nausea Only      Medication List    TAKE these medications   methocarbamol 500 MG tablet Commonly known as:  ROBAXIN Take 500 mg by mouth daily as needed for muscle spasms.   oxyCODONE 5 MG immediate release tablet Commonly known as:  ROXICODONE Take 1-2 tablets (5-10 mg total) by mouth every 6 (six) hours as needed for severe pain.   ranitidine 150 MG tablet Commonly known as:  ZANTAC Take 150 mg by mouth daily as needed for heartburn.   traMADol 50 MG tablet Commonly known as:  ULTRAM Take 50 mg by mouth every 6 (six) hours as needed.   TUMS 500 MG chewable tablet Generic drug:  calcium carbonate Chew 1 tablet by mouth daily as needed for indigestion or heartburn.      Follow-up Information    Nicholes Stairs, MD In 2 weeks.   Specialty:  Orthopedic Surgery Why:  For wound re-check Contact information: [address] [place] [address] [phone]           Signed: Geralynn Rile, MD Orthopaedic Surgery 01/18/2018, 5:41 PM

## 2018-01-21 DIAGNOSIS — M25512 Pain in left shoulder: Secondary | ICD-10-CM | POA: Diagnosis not present

## 2018-01-21 DIAGNOSIS — Z4789 Encounter for other orthopedic aftercare: Secondary | ICD-10-CM | POA: Diagnosis not present

## 2018-01-21 DIAGNOSIS — R29898 Other symptoms and signs involving the musculoskeletal system: Secondary | ICD-10-CM | POA: Diagnosis not present

## 2018-01-21 DIAGNOSIS — Z96612 Presence of left artificial shoulder joint: Secondary | ICD-10-CM | POA: Diagnosis not present

## 2018-01-21 DIAGNOSIS — M25612 Stiffness of left shoulder, not elsewhere classified: Secondary | ICD-10-CM | POA: Diagnosis not present

## 2018-01-26 DIAGNOSIS — Z96612 Presence of left artificial shoulder joint: Secondary | ICD-10-CM | POA: Diagnosis not present

## 2018-01-26 DIAGNOSIS — M25612 Stiffness of left shoulder, not elsewhere classified: Secondary | ICD-10-CM | POA: Diagnosis not present

## 2018-01-26 DIAGNOSIS — Z4789 Encounter for other orthopedic aftercare: Secondary | ICD-10-CM | POA: Diagnosis not present

## 2018-01-26 DIAGNOSIS — M25512 Pain in left shoulder: Secondary | ICD-10-CM | POA: Diagnosis not present

## 2018-01-26 DIAGNOSIS — R29898 Other symptoms and signs involving the musculoskeletal system: Secondary | ICD-10-CM | POA: Diagnosis not present

## 2018-01-28 DIAGNOSIS — R29898 Other symptoms and signs involving the musculoskeletal system: Secondary | ICD-10-CM | POA: Diagnosis not present

## 2018-01-28 DIAGNOSIS — Z96612 Presence of left artificial shoulder joint: Secondary | ICD-10-CM | POA: Diagnosis not present

## 2018-01-28 DIAGNOSIS — M25512 Pain in left shoulder: Secondary | ICD-10-CM | POA: Diagnosis not present

## 2018-01-28 DIAGNOSIS — Z4789 Encounter for other orthopedic aftercare: Secondary | ICD-10-CM | POA: Diagnosis not present

## 2018-01-28 DIAGNOSIS — M25612 Stiffness of left shoulder, not elsewhere classified: Secondary | ICD-10-CM | POA: Diagnosis not present

## 2018-01-31 DIAGNOSIS — M25612 Stiffness of left shoulder, not elsewhere classified: Secondary | ICD-10-CM | POA: Diagnosis not present

## 2018-01-31 DIAGNOSIS — R29898 Other symptoms and signs involving the musculoskeletal system: Secondary | ICD-10-CM | POA: Diagnosis not present

## 2018-01-31 DIAGNOSIS — Z96612 Presence of left artificial shoulder joint: Secondary | ICD-10-CM | POA: Diagnosis not present

## 2018-01-31 DIAGNOSIS — M25512 Pain in left shoulder: Secondary | ICD-10-CM | POA: Diagnosis not present

## 2018-01-31 DIAGNOSIS — Z4789 Encounter for other orthopedic aftercare: Secondary | ICD-10-CM | POA: Diagnosis not present

## 2018-02-04 DIAGNOSIS — M25512 Pain in left shoulder: Secondary | ICD-10-CM | POA: Diagnosis not present

## 2018-02-04 DIAGNOSIS — Z96612 Presence of left artificial shoulder joint: Secondary | ICD-10-CM | POA: Diagnosis not present

## 2018-02-04 DIAGNOSIS — R29898 Other symptoms and signs involving the musculoskeletal system: Secondary | ICD-10-CM | POA: Diagnosis not present

## 2018-02-04 DIAGNOSIS — M25612 Stiffness of left shoulder, not elsewhere classified: Secondary | ICD-10-CM | POA: Diagnosis not present

## 2018-02-04 DIAGNOSIS — Z4789 Encounter for other orthopedic aftercare: Secondary | ICD-10-CM | POA: Diagnosis not present

## 2018-02-07 DIAGNOSIS — M25612 Stiffness of left shoulder, not elsewhere classified: Secondary | ICD-10-CM | POA: Diagnosis not present

## 2018-02-07 DIAGNOSIS — R29898 Other symptoms and signs involving the musculoskeletal system: Secondary | ICD-10-CM | POA: Diagnosis not present

## 2018-02-07 DIAGNOSIS — Z4789 Encounter for other orthopedic aftercare: Secondary | ICD-10-CM | POA: Diagnosis not present

## 2018-02-07 DIAGNOSIS — Z96612 Presence of left artificial shoulder joint: Secondary | ICD-10-CM | POA: Diagnosis not present

## 2018-02-07 DIAGNOSIS — M25512 Pain in left shoulder: Secondary | ICD-10-CM | POA: Diagnosis not present

## 2018-02-11 DIAGNOSIS — Z96612 Presence of left artificial shoulder joint: Secondary | ICD-10-CM | POA: Diagnosis not present

## 2018-02-11 DIAGNOSIS — M25512 Pain in left shoulder: Secondary | ICD-10-CM | POA: Diagnosis not present

## 2018-02-11 DIAGNOSIS — R29898 Other symptoms and signs involving the musculoskeletal system: Secondary | ICD-10-CM | POA: Diagnosis not present

## 2018-02-11 DIAGNOSIS — M25612 Stiffness of left shoulder, not elsewhere classified: Secondary | ICD-10-CM | POA: Diagnosis not present

## 2018-02-11 DIAGNOSIS — Z4789 Encounter for other orthopedic aftercare: Secondary | ICD-10-CM | POA: Diagnosis not present

## 2018-02-14 DIAGNOSIS — R29898 Other symptoms and signs involving the musculoskeletal system: Secondary | ICD-10-CM | POA: Diagnosis not present

## 2018-02-14 DIAGNOSIS — Z4789 Encounter for other orthopedic aftercare: Secondary | ICD-10-CM | POA: Diagnosis not present

## 2018-02-14 DIAGNOSIS — M25512 Pain in left shoulder: Secondary | ICD-10-CM | POA: Diagnosis not present

## 2018-02-14 DIAGNOSIS — Z96612 Presence of left artificial shoulder joint: Secondary | ICD-10-CM | POA: Diagnosis not present

## 2018-02-14 DIAGNOSIS — M25612 Stiffness of left shoulder, not elsewhere classified: Secondary | ICD-10-CM | POA: Diagnosis not present

## 2018-02-18 DIAGNOSIS — R29898 Other symptoms and signs involving the musculoskeletal system: Secondary | ICD-10-CM | POA: Diagnosis not present

## 2018-02-18 DIAGNOSIS — Z4789 Encounter for other orthopedic aftercare: Secondary | ICD-10-CM | POA: Diagnosis not present

## 2018-02-18 DIAGNOSIS — M25612 Stiffness of left shoulder, not elsewhere classified: Secondary | ICD-10-CM | POA: Diagnosis not present

## 2018-02-18 DIAGNOSIS — M25512 Pain in left shoulder: Secondary | ICD-10-CM | POA: Diagnosis not present

## 2018-02-18 DIAGNOSIS — Z96612 Presence of left artificial shoulder joint: Secondary | ICD-10-CM | POA: Diagnosis not present

## 2018-02-21 DIAGNOSIS — R29898 Other symptoms and signs involving the musculoskeletal system: Secondary | ICD-10-CM | POA: Diagnosis not present

## 2018-02-21 DIAGNOSIS — Z96612 Presence of left artificial shoulder joint: Secondary | ICD-10-CM | POA: Diagnosis not present

## 2018-02-21 DIAGNOSIS — Z4789 Encounter for other orthopedic aftercare: Secondary | ICD-10-CM | POA: Diagnosis not present

## 2018-02-21 DIAGNOSIS — M25512 Pain in left shoulder: Secondary | ICD-10-CM | POA: Diagnosis not present

## 2018-02-21 DIAGNOSIS — M25612 Stiffness of left shoulder, not elsewhere classified: Secondary | ICD-10-CM | POA: Diagnosis not present

## 2018-02-22 DIAGNOSIS — Z4789 Encounter for other orthopedic aftercare: Secondary | ICD-10-CM | POA: Diagnosis not present

## 2018-02-25 DIAGNOSIS — R29898 Other symptoms and signs involving the musculoskeletal system: Secondary | ICD-10-CM | POA: Diagnosis not present

## 2018-02-25 DIAGNOSIS — Z96612 Presence of left artificial shoulder joint: Secondary | ICD-10-CM | POA: Diagnosis not present

## 2018-02-25 DIAGNOSIS — M25612 Stiffness of left shoulder, not elsewhere classified: Secondary | ICD-10-CM | POA: Diagnosis not present

## 2018-02-25 DIAGNOSIS — M25512 Pain in left shoulder: Secondary | ICD-10-CM | POA: Diagnosis not present

## 2018-02-25 DIAGNOSIS — Z4789 Encounter for other orthopedic aftercare: Secondary | ICD-10-CM | POA: Diagnosis not present

## 2018-02-28 DIAGNOSIS — R29898 Other symptoms and signs involving the musculoskeletal system: Secondary | ICD-10-CM | POA: Diagnosis not present

## 2018-02-28 DIAGNOSIS — M25612 Stiffness of left shoulder, not elsewhere classified: Secondary | ICD-10-CM | POA: Diagnosis not present

## 2018-02-28 DIAGNOSIS — Z4789 Encounter for other orthopedic aftercare: Secondary | ICD-10-CM | POA: Diagnosis not present

## 2018-02-28 DIAGNOSIS — M25512 Pain in left shoulder: Secondary | ICD-10-CM | POA: Diagnosis not present

## 2018-02-28 DIAGNOSIS — Z96612 Presence of left artificial shoulder joint: Secondary | ICD-10-CM | POA: Diagnosis not present

## 2018-03-01 DIAGNOSIS — J0100 Acute maxillary sinusitis, unspecified: Secondary | ICD-10-CM | POA: Diagnosis not present

## 2018-03-04 DIAGNOSIS — M25612 Stiffness of left shoulder, not elsewhere classified: Secondary | ICD-10-CM | POA: Diagnosis not present

## 2018-03-04 DIAGNOSIS — Z4789 Encounter for other orthopedic aftercare: Secondary | ICD-10-CM | POA: Diagnosis not present

## 2018-03-04 DIAGNOSIS — R29898 Other symptoms and signs involving the musculoskeletal system: Secondary | ICD-10-CM | POA: Diagnosis not present

## 2018-03-04 DIAGNOSIS — Z96612 Presence of left artificial shoulder joint: Secondary | ICD-10-CM | POA: Diagnosis not present

## 2018-03-04 DIAGNOSIS — M25512 Pain in left shoulder: Secondary | ICD-10-CM | POA: Diagnosis not present

## 2018-03-07 DIAGNOSIS — R29898 Other symptoms and signs involving the musculoskeletal system: Secondary | ICD-10-CM | POA: Diagnosis not present

## 2018-03-07 DIAGNOSIS — M25512 Pain in left shoulder: Secondary | ICD-10-CM | POA: Diagnosis not present

## 2018-03-07 DIAGNOSIS — Z4789 Encounter for other orthopedic aftercare: Secondary | ICD-10-CM | POA: Diagnosis not present

## 2018-03-07 DIAGNOSIS — Z96612 Presence of left artificial shoulder joint: Secondary | ICD-10-CM | POA: Diagnosis not present

## 2018-03-07 DIAGNOSIS — M25612 Stiffness of left shoulder, not elsewhere classified: Secondary | ICD-10-CM | POA: Diagnosis not present

## 2018-03-11 DIAGNOSIS — M25512 Pain in left shoulder: Secondary | ICD-10-CM | POA: Diagnosis not present

## 2018-03-11 DIAGNOSIS — R29898 Other symptoms and signs involving the musculoskeletal system: Secondary | ICD-10-CM | POA: Diagnosis not present

## 2018-03-11 DIAGNOSIS — Z4789 Encounter for other orthopedic aftercare: Secondary | ICD-10-CM | POA: Diagnosis not present

## 2018-03-11 DIAGNOSIS — M25612 Stiffness of left shoulder, not elsewhere classified: Secondary | ICD-10-CM | POA: Diagnosis not present

## 2018-03-11 DIAGNOSIS — Z96612 Presence of left artificial shoulder joint: Secondary | ICD-10-CM | POA: Diagnosis not present

## 2018-03-15 DIAGNOSIS — M25612 Stiffness of left shoulder, not elsewhere classified: Secondary | ICD-10-CM | POA: Diagnosis not present

## 2018-03-15 DIAGNOSIS — R29898 Other symptoms and signs involving the musculoskeletal system: Secondary | ICD-10-CM | POA: Diagnosis not present

## 2018-03-15 DIAGNOSIS — M25512 Pain in left shoulder: Secondary | ICD-10-CM | POA: Diagnosis not present

## 2018-03-15 DIAGNOSIS — Z4789 Encounter for other orthopedic aftercare: Secondary | ICD-10-CM | POA: Diagnosis not present

## 2018-03-15 DIAGNOSIS — Z96612 Presence of left artificial shoulder joint: Secondary | ICD-10-CM | POA: Diagnosis not present

## 2018-03-16 DIAGNOSIS — Z4789 Encounter for other orthopedic aftercare: Secondary | ICD-10-CM | POA: Diagnosis not present

## 2018-03-16 DIAGNOSIS — M25612 Stiffness of left shoulder, not elsewhere classified: Secondary | ICD-10-CM | POA: Diagnosis not present

## 2018-03-16 DIAGNOSIS — M25512 Pain in left shoulder: Secondary | ICD-10-CM | POA: Diagnosis not present

## 2018-03-16 DIAGNOSIS — Z96612 Presence of left artificial shoulder joint: Secondary | ICD-10-CM | POA: Diagnosis not present

## 2018-03-16 DIAGNOSIS — R29898 Other symptoms and signs involving the musculoskeletal system: Secondary | ICD-10-CM | POA: Diagnosis not present

## 2018-03-18 DIAGNOSIS — Z96612 Presence of left artificial shoulder joint: Secondary | ICD-10-CM | POA: Diagnosis not present

## 2018-03-18 DIAGNOSIS — M25612 Stiffness of left shoulder, not elsewhere classified: Secondary | ICD-10-CM | POA: Diagnosis not present

## 2018-03-18 DIAGNOSIS — R29898 Other symptoms and signs involving the musculoskeletal system: Secondary | ICD-10-CM | POA: Diagnosis not present

## 2018-03-18 DIAGNOSIS — M25512 Pain in left shoulder: Secondary | ICD-10-CM | POA: Diagnosis not present

## 2018-03-18 DIAGNOSIS — Z4789 Encounter for other orthopedic aftercare: Secondary | ICD-10-CM | POA: Diagnosis not present

## 2018-03-22 DIAGNOSIS — R29898 Other symptoms and signs involving the musculoskeletal system: Secondary | ICD-10-CM | POA: Diagnosis not present

## 2018-03-22 DIAGNOSIS — Z4789 Encounter for other orthopedic aftercare: Secondary | ICD-10-CM | POA: Diagnosis not present

## 2018-03-22 DIAGNOSIS — M25612 Stiffness of left shoulder, not elsewhere classified: Secondary | ICD-10-CM | POA: Diagnosis not present

## 2018-03-22 DIAGNOSIS — Z96612 Presence of left artificial shoulder joint: Secondary | ICD-10-CM | POA: Diagnosis not present

## 2018-03-22 DIAGNOSIS — M25512 Pain in left shoulder: Secondary | ICD-10-CM | POA: Diagnosis not present

## 2018-03-25 DIAGNOSIS — M25612 Stiffness of left shoulder, not elsewhere classified: Secondary | ICD-10-CM | POA: Diagnosis not present

## 2018-03-25 DIAGNOSIS — Z96612 Presence of left artificial shoulder joint: Secondary | ICD-10-CM | POA: Diagnosis not present

## 2018-03-25 DIAGNOSIS — Z4789 Encounter for other orthopedic aftercare: Secondary | ICD-10-CM | POA: Diagnosis not present

## 2018-03-25 DIAGNOSIS — M25512 Pain in left shoulder: Secondary | ICD-10-CM | POA: Diagnosis not present

## 2018-03-25 DIAGNOSIS — R29898 Other symptoms and signs involving the musculoskeletal system: Secondary | ICD-10-CM | POA: Diagnosis not present

## 2018-03-30 DIAGNOSIS — R29898 Other symptoms and signs involving the musculoskeletal system: Secondary | ICD-10-CM | POA: Diagnosis not present

## 2018-03-30 DIAGNOSIS — Z96612 Presence of left artificial shoulder joint: Secondary | ICD-10-CM | POA: Diagnosis not present

## 2018-03-30 DIAGNOSIS — M25512 Pain in left shoulder: Secondary | ICD-10-CM | POA: Diagnosis not present

## 2018-03-30 DIAGNOSIS — Z4789 Encounter for other orthopedic aftercare: Secondary | ICD-10-CM | POA: Diagnosis not present

## 2018-03-30 DIAGNOSIS — M25612 Stiffness of left shoulder, not elsewhere classified: Secondary | ICD-10-CM | POA: Diagnosis not present

## 2018-04-01 DIAGNOSIS — R29898 Other symptoms and signs involving the musculoskeletal system: Secondary | ICD-10-CM | POA: Diagnosis not present

## 2018-04-01 DIAGNOSIS — Z96612 Presence of left artificial shoulder joint: Secondary | ICD-10-CM | POA: Diagnosis not present

## 2018-04-01 DIAGNOSIS — M25512 Pain in left shoulder: Secondary | ICD-10-CM | POA: Diagnosis not present

## 2018-04-01 DIAGNOSIS — M25612 Stiffness of left shoulder, not elsewhere classified: Secondary | ICD-10-CM | POA: Diagnosis not present

## 2018-04-01 DIAGNOSIS — Z4789 Encounter for other orthopedic aftercare: Secondary | ICD-10-CM | POA: Diagnosis not present

## 2018-04-05 DIAGNOSIS — M25512 Pain in left shoulder: Secondary | ICD-10-CM | POA: Diagnosis not present

## 2018-04-05 DIAGNOSIS — R29898 Other symptoms and signs involving the musculoskeletal system: Secondary | ICD-10-CM | POA: Diagnosis not present

## 2018-04-05 DIAGNOSIS — Z4789 Encounter for other orthopedic aftercare: Secondary | ICD-10-CM | POA: Diagnosis not present

## 2018-04-05 DIAGNOSIS — Z96612 Presence of left artificial shoulder joint: Secondary | ICD-10-CM | POA: Diagnosis not present

## 2018-04-05 DIAGNOSIS — M25612 Stiffness of left shoulder, not elsewhere classified: Secondary | ICD-10-CM | POA: Diagnosis not present

## 2018-04-07 DIAGNOSIS — Z96612 Presence of left artificial shoulder joint: Secondary | ICD-10-CM | POA: Diagnosis not present

## 2018-04-07 DIAGNOSIS — M25512 Pain in left shoulder: Secondary | ICD-10-CM | POA: Diagnosis not present

## 2018-04-07 DIAGNOSIS — R29898 Other symptoms and signs involving the musculoskeletal system: Secondary | ICD-10-CM | POA: Diagnosis not present

## 2018-04-07 DIAGNOSIS — Z4789 Encounter for other orthopedic aftercare: Secondary | ICD-10-CM | POA: Diagnosis not present

## 2018-04-07 DIAGNOSIS — M25612 Stiffness of left shoulder, not elsewhere classified: Secondary | ICD-10-CM | POA: Diagnosis not present

## 2018-04-13 DIAGNOSIS — M25512 Pain in left shoulder: Secondary | ICD-10-CM | POA: Diagnosis not present

## 2018-04-13 DIAGNOSIS — M25612 Stiffness of left shoulder, not elsewhere classified: Secondary | ICD-10-CM | POA: Diagnosis not present

## 2018-04-13 DIAGNOSIS — Z4789 Encounter for other orthopedic aftercare: Secondary | ICD-10-CM | POA: Diagnosis not present

## 2018-04-13 DIAGNOSIS — R29898 Other symptoms and signs involving the musculoskeletal system: Secondary | ICD-10-CM | POA: Diagnosis not present

## 2018-04-13 DIAGNOSIS — Z96612 Presence of left artificial shoulder joint: Secondary | ICD-10-CM | POA: Diagnosis not present

## 2018-04-15 DIAGNOSIS — R29898 Other symptoms and signs involving the musculoskeletal system: Secondary | ICD-10-CM | POA: Diagnosis not present

## 2018-04-15 DIAGNOSIS — Z96612 Presence of left artificial shoulder joint: Secondary | ICD-10-CM | POA: Diagnosis not present

## 2018-04-15 DIAGNOSIS — Z4789 Encounter for other orthopedic aftercare: Secondary | ICD-10-CM | POA: Diagnosis not present

## 2018-04-15 DIAGNOSIS — M25612 Stiffness of left shoulder, not elsewhere classified: Secondary | ICD-10-CM | POA: Diagnosis not present

## 2018-04-15 DIAGNOSIS — M25512 Pain in left shoulder: Secondary | ICD-10-CM | POA: Diagnosis not present

## 2018-04-20 DIAGNOSIS — Z4789 Encounter for other orthopedic aftercare: Secondary | ICD-10-CM | POA: Diagnosis not present

## 2018-04-20 DIAGNOSIS — M25612 Stiffness of left shoulder, not elsewhere classified: Secondary | ICD-10-CM | POA: Diagnosis not present

## 2018-04-20 DIAGNOSIS — M25512 Pain in left shoulder: Secondary | ICD-10-CM | POA: Diagnosis not present

## 2018-04-20 DIAGNOSIS — Z96612 Presence of left artificial shoulder joint: Secondary | ICD-10-CM | POA: Diagnosis not present

## 2018-04-20 DIAGNOSIS — R29898 Other symptoms and signs involving the musculoskeletal system: Secondary | ICD-10-CM | POA: Diagnosis not present

## 2018-04-21 DIAGNOSIS — R29898 Other symptoms and signs involving the musculoskeletal system: Secondary | ICD-10-CM | POA: Diagnosis not present

## 2018-04-21 DIAGNOSIS — Z4789 Encounter for other orthopedic aftercare: Secondary | ICD-10-CM | POA: Diagnosis not present

## 2018-04-21 DIAGNOSIS — M25512 Pain in left shoulder: Secondary | ICD-10-CM | POA: Diagnosis not present

## 2018-04-21 DIAGNOSIS — M25612 Stiffness of left shoulder, not elsewhere classified: Secondary | ICD-10-CM | POA: Diagnosis not present

## 2018-04-21 DIAGNOSIS — Z96612 Presence of left artificial shoulder joint: Secondary | ICD-10-CM | POA: Diagnosis not present

## 2018-04-26 DIAGNOSIS — M25512 Pain in left shoulder: Secondary | ICD-10-CM | POA: Diagnosis not present

## 2018-04-26 DIAGNOSIS — Z96612 Presence of left artificial shoulder joint: Secondary | ICD-10-CM | POA: Diagnosis not present

## 2018-04-26 DIAGNOSIS — Z4789 Encounter for other orthopedic aftercare: Secondary | ICD-10-CM | POA: Diagnosis not present

## 2018-04-26 DIAGNOSIS — R29898 Other symptoms and signs involving the musculoskeletal system: Secondary | ICD-10-CM | POA: Diagnosis not present

## 2018-04-26 DIAGNOSIS — M25612 Stiffness of left shoulder, not elsewhere classified: Secondary | ICD-10-CM | POA: Diagnosis not present

## 2018-04-29 DIAGNOSIS — R29898 Other symptoms and signs involving the musculoskeletal system: Secondary | ICD-10-CM | POA: Diagnosis not present

## 2018-04-29 DIAGNOSIS — M545 Low back pain: Secondary | ICD-10-CM | POA: Diagnosis not present

## 2018-04-29 DIAGNOSIS — M5136 Other intervertebral disc degeneration, lumbar region: Secondary | ICD-10-CM | POA: Diagnosis not present

## 2018-04-29 DIAGNOSIS — M25612 Stiffness of left shoulder, not elsewhere classified: Secondary | ICD-10-CM | POA: Diagnosis not present

## 2018-04-29 DIAGNOSIS — M25512 Pain in left shoulder: Secondary | ICD-10-CM | POA: Diagnosis not present

## 2018-04-29 DIAGNOSIS — Z96612 Presence of left artificial shoulder joint: Secondary | ICD-10-CM | POA: Diagnosis not present

## 2018-04-29 DIAGNOSIS — M542 Cervicalgia: Secondary | ICD-10-CM | POA: Diagnosis not present

## 2018-04-29 DIAGNOSIS — Z4789 Encounter for other orthopedic aftercare: Secondary | ICD-10-CM | POA: Diagnosis not present

## 2018-05-03 DIAGNOSIS — Z4789 Encounter for other orthopedic aftercare: Secondary | ICD-10-CM | POA: Diagnosis not present

## 2018-05-03 DIAGNOSIS — R29898 Other symptoms and signs involving the musculoskeletal system: Secondary | ICD-10-CM | POA: Diagnosis not present

## 2018-05-03 DIAGNOSIS — Z96612 Presence of left artificial shoulder joint: Secondary | ICD-10-CM | POA: Diagnosis not present

## 2018-05-03 DIAGNOSIS — M25612 Stiffness of left shoulder, not elsewhere classified: Secondary | ICD-10-CM | POA: Diagnosis not present

## 2018-05-03 DIAGNOSIS — M25512 Pain in left shoulder: Secondary | ICD-10-CM | POA: Diagnosis not present

## 2018-05-05 DIAGNOSIS — M25612 Stiffness of left shoulder, not elsewhere classified: Secondary | ICD-10-CM | POA: Diagnosis not present

## 2018-05-05 DIAGNOSIS — Z4789 Encounter for other orthopedic aftercare: Secondary | ICD-10-CM | POA: Diagnosis not present

## 2018-05-05 DIAGNOSIS — M25512 Pain in left shoulder: Secondary | ICD-10-CM | POA: Diagnosis not present

## 2018-05-05 DIAGNOSIS — Z96612 Presence of left artificial shoulder joint: Secondary | ICD-10-CM | POA: Diagnosis not present

## 2018-05-05 DIAGNOSIS — R29898 Other symptoms and signs involving the musculoskeletal system: Secondary | ICD-10-CM | POA: Diagnosis not present

## 2018-05-10 DIAGNOSIS — R29898 Other symptoms and signs involving the musculoskeletal system: Secondary | ICD-10-CM | POA: Diagnosis not present

## 2018-05-10 DIAGNOSIS — M25512 Pain in left shoulder: Secondary | ICD-10-CM | POA: Diagnosis not present

## 2018-05-10 DIAGNOSIS — Z96612 Presence of left artificial shoulder joint: Secondary | ICD-10-CM | POA: Diagnosis not present

## 2018-05-10 DIAGNOSIS — M25612 Stiffness of left shoulder, not elsewhere classified: Secondary | ICD-10-CM | POA: Diagnosis not present

## 2018-05-10 DIAGNOSIS — Z4789 Encounter for other orthopedic aftercare: Secondary | ICD-10-CM | POA: Diagnosis not present

## 2018-05-19 DIAGNOSIS — Z96612 Presence of left artificial shoulder joint: Secondary | ICD-10-CM | POA: Diagnosis not present

## 2018-05-19 DIAGNOSIS — Z471 Aftercare following joint replacement surgery: Secondary | ICD-10-CM | POA: Diagnosis not present

## 2018-05-20 DIAGNOSIS — Z4789 Encounter for other orthopedic aftercare: Secondary | ICD-10-CM | POA: Diagnosis not present

## 2018-05-20 DIAGNOSIS — R29898 Other symptoms and signs involving the musculoskeletal system: Secondary | ICD-10-CM | POA: Diagnosis not present

## 2018-05-20 DIAGNOSIS — Z96612 Presence of left artificial shoulder joint: Secondary | ICD-10-CM | POA: Diagnosis not present

## 2018-05-20 DIAGNOSIS — M25612 Stiffness of left shoulder, not elsewhere classified: Secondary | ICD-10-CM | POA: Diagnosis not present

## 2018-05-20 DIAGNOSIS — M25512 Pain in left shoulder: Secondary | ICD-10-CM | POA: Diagnosis not present

## 2018-05-24 DIAGNOSIS — Z96612 Presence of left artificial shoulder joint: Secondary | ICD-10-CM | POA: Diagnosis not present

## 2018-05-24 DIAGNOSIS — Z4789 Encounter for other orthopedic aftercare: Secondary | ICD-10-CM | POA: Diagnosis not present

## 2018-05-24 DIAGNOSIS — M25612 Stiffness of left shoulder, not elsewhere classified: Secondary | ICD-10-CM | POA: Diagnosis not present

## 2018-05-24 DIAGNOSIS — R29898 Other symptoms and signs involving the musculoskeletal system: Secondary | ICD-10-CM | POA: Diagnosis not present

## 2018-05-24 DIAGNOSIS — M25512 Pain in left shoulder: Secondary | ICD-10-CM | POA: Diagnosis not present

## 2018-06-07 DIAGNOSIS — Z96612 Presence of left artificial shoulder joint: Secondary | ICD-10-CM | POA: Diagnosis not present

## 2018-06-07 DIAGNOSIS — M25612 Stiffness of left shoulder, not elsewhere classified: Secondary | ICD-10-CM | POA: Diagnosis not present

## 2018-06-07 DIAGNOSIS — Z4789 Encounter for other orthopedic aftercare: Secondary | ICD-10-CM | POA: Diagnosis not present

## 2018-06-07 DIAGNOSIS — M25512 Pain in left shoulder: Secondary | ICD-10-CM | POA: Diagnosis not present

## 2018-06-07 DIAGNOSIS — R29898 Other symptoms and signs involving the musculoskeletal system: Secondary | ICD-10-CM | POA: Diagnosis not present

## 2018-06-16 DIAGNOSIS — M25512 Pain in left shoulder: Secondary | ICD-10-CM | POA: Diagnosis not present

## 2018-06-16 DIAGNOSIS — M25612 Stiffness of left shoulder, not elsewhere classified: Secondary | ICD-10-CM | POA: Diagnosis not present

## 2018-06-16 DIAGNOSIS — Z4789 Encounter for other orthopedic aftercare: Secondary | ICD-10-CM | POA: Diagnosis not present

## 2018-06-16 DIAGNOSIS — Z96612 Presence of left artificial shoulder joint: Secondary | ICD-10-CM | POA: Diagnosis not present

## 2018-06-16 DIAGNOSIS — R29898 Other symptoms and signs involving the musculoskeletal system: Secondary | ICD-10-CM | POA: Diagnosis not present

## 2018-06-20 ENCOUNTER — Other Ambulatory Visit: Payer: Self-pay | Admitting: Nurse Practitioner

## 2018-06-20 DIAGNOSIS — Z1231 Encounter for screening mammogram for malignant neoplasm of breast: Secondary | ICD-10-CM

## 2018-06-24 DIAGNOSIS — M25512 Pain in left shoulder: Secondary | ICD-10-CM | POA: Diagnosis not present

## 2018-06-24 DIAGNOSIS — R29898 Other symptoms and signs involving the musculoskeletal system: Secondary | ICD-10-CM | POA: Diagnosis not present

## 2018-06-24 DIAGNOSIS — Z4789 Encounter for other orthopedic aftercare: Secondary | ICD-10-CM | POA: Diagnosis not present

## 2018-06-24 DIAGNOSIS — Z96612 Presence of left artificial shoulder joint: Secondary | ICD-10-CM | POA: Diagnosis not present

## 2018-06-24 DIAGNOSIS — M25612 Stiffness of left shoulder, not elsewhere classified: Secondary | ICD-10-CM | POA: Diagnosis not present

## 2018-06-30 DIAGNOSIS — Z96612 Presence of left artificial shoulder joint: Secondary | ICD-10-CM | POA: Diagnosis not present

## 2018-06-30 DIAGNOSIS — Z4789 Encounter for other orthopedic aftercare: Secondary | ICD-10-CM | POA: Diagnosis not present

## 2018-06-30 DIAGNOSIS — M25512 Pain in left shoulder: Secondary | ICD-10-CM | POA: Diagnosis not present

## 2018-06-30 DIAGNOSIS — R29898 Other symptoms and signs involving the musculoskeletal system: Secondary | ICD-10-CM | POA: Diagnosis not present

## 2018-06-30 DIAGNOSIS — M25612 Stiffness of left shoulder, not elsewhere classified: Secondary | ICD-10-CM | POA: Diagnosis not present

## 2018-07-04 DIAGNOSIS — M25512 Pain in left shoulder: Secondary | ICD-10-CM | POA: Diagnosis not present

## 2018-07-04 DIAGNOSIS — R29898 Other symptoms and signs involving the musculoskeletal system: Secondary | ICD-10-CM | POA: Diagnosis not present

## 2018-07-04 DIAGNOSIS — Z96612 Presence of left artificial shoulder joint: Secondary | ICD-10-CM | POA: Diagnosis not present

## 2018-07-04 DIAGNOSIS — M25612 Stiffness of left shoulder, not elsewhere classified: Secondary | ICD-10-CM | POA: Diagnosis not present

## 2018-07-04 DIAGNOSIS — Z4789 Encounter for other orthopedic aftercare: Secondary | ICD-10-CM | POA: Diagnosis not present

## 2018-07-12 DIAGNOSIS — M25612 Stiffness of left shoulder, not elsewhere classified: Secondary | ICD-10-CM | POA: Diagnosis not present

## 2018-07-12 DIAGNOSIS — R29898 Other symptoms and signs involving the musculoskeletal system: Secondary | ICD-10-CM | POA: Diagnosis not present

## 2018-07-12 DIAGNOSIS — Z96612 Presence of left artificial shoulder joint: Secondary | ICD-10-CM | POA: Diagnosis not present

## 2018-07-12 DIAGNOSIS — M25512 Pain in left shoulder: Secondary | ICD-10-CM | POA: Diagnosis not present

## 2018-07-12 DIAGNOSIS — Z4789 Encounter for other orthopedic aftercare: Secondary | ICD-10-CM | POA: Diagnosis not present

## 2018-07-25 DIAGNOSIS — Z96612 Presence of left artificial shoulder joint: Secondary | ICD-10-CM | POA: Diagnosis not present

## 2018-07-25 DIAGNOSIS — Z471 Aftercare following joint replacement surgery: Secondary | ICD-10-CM | POA: Diagnosis not present

## 2018-07-29 ENCOUNTER — Ambulatory Visit
Admission: RE | Admit: 2018-07-29 | Discharge: 2018-07-29 | Disposition: A | Discharge status: Home / Self Care | Payer: BLUE CROSS/BLUE SHIELD | Source: Ambulatory Visit | Attending: Nurse Practitioner | Admitting: Nurse Practitioner

## 2018-07-29 DIAGNOSIS — Z1231 Encounter for screening mammogram for malignant neoplasm of breast: Secondary | ICD-10-CM | POA: Diagnosis not present

## 2018-08-25 DIAGNOSIS — Z79899 Other long term (current) drug therapy: Secondary | ICD-10-CM | POA: Diagnosis not present

## 2018-08-25 DIAGNOSIS — M5416 Radiculopathy, lumbar region: Secondary | ICD-10-CM | POA: Diagnosis not present

## 2018-08-25 DIAGNOSIS — M5412 Radiculopathy, cervical region: Secondary | ICD-10-CM | POA: Diagnosis not present

## 2018-12-21 DIAGNOSIS — M5136 Other intervertebral disc degeneration, lumbar region: Secondary | ICD-10-CM | POA: Diagnosis not present

## 2018-12-21 DIAGNOSIS — M542 Cervicalgia: Secondary | ICD-10-CM | POA: Diagnosis not present

## 2018-12-21 DIAGNOSIS — M545 Low back pain: Secondary | ICD-10-CM | POA: Diagnosis not present

## 2019-04-24 DIAGNOSIS — Z79891 Long term (current) use of opiate analgesic: Secondary | ICD-10-CM | POA: Diagnosis not present

## 2019-04-24 DIAGNOSIS — M545 Low back pain: Secondary | ICD-10-CM | POA: Diagnosis not present

## 2019-04-24 DIAGNOSIS — M542 Cervicalgia: Secondary | ICD-10-CM | POA: Diagnosis not present

## 2019-04-30 DIAGNOSIS — L247 Irritant contact dermatitis due to plants, except food: Secondary | ICD-10-CM | POA: Diagnosis not present

## 2019-05-02 DIAGNOSIS — L239 Allergic contact dermatitis, unspecified cause: Secondary | ICD-10-CM | POA: Diagnosis not present

## 2019-05-02 DIAGNOSIS — Z6833 Body mass index (BMI) 33.0-33.9, adult: Secondary | ICD-10-CM | POA: Diagnosis not present

## 2019-06-19 DIAGNOSIS — R030 Elevated blood-pressure reading, without diagnosis of hypertension: Secondary | ICD-10-CM | POA: Diagnosis not present

## 2019-06-19 DIAGNOSIS — R5383 Other fatigue: Secondary | ICD-10-CM | POA: Diagnosis not present

## 2019-06-19 DIAGNOSIS — Z1331 Encounter for screening for depression: Secondary | ICD-10-CM | POA: Diagnosis not present

## 2019-06-19 DIAGNOSIS — E7800 Pure hypercholesterolemia, unspecified: Secondary | ICD-10-CM | POA: Diagnosis not present

## 2019-06-19 DIAGNOSIS — R7303 Prediabetes: Secondary | ICD-10-CM | POA: Diagnosis not present

## 2019-06-19 DIAGNOSIS — E669 Obesity, unspecified: Secondary | ICD-10-CM | POA: Diagnosis not present

## 2019-06-27 ENCOUNTER — Encounter: Payer: Self-pay | Admitting: *Deleted

## 2019-07-04 ENCOUNTER — Telehealth: Payer: Self-pay | Admitting: Gastroenterology

## 2019-07-04 NOTE — Telephone Encounter (Signed)
Pt left vm to schedule a colonoscopy   °

## 2019-07-10 NOTE — Telephone Encounter (Signed)
Returned patients call to schedule colonoscopy.  LVM for pt to call office. Screening colonoscopy-referral will need to be re-opened.  Thanks [employer]

## 2019-07-17 ENCOUNTER — Telehealth: Payer: Self-pay | Admitting: Gastroenterology

## 2019-07-17 NOTE — Telephone Encounter (Signed)
LVM for pt to call office back to schedule her colonoscopy.  Thanks [employer]

## 2019-07-17 NOTE — Telephone Encounter (Signed)
Pt left vm to schedule a colonoscopy   °

## 2019-07-31 ENCOUNTER — Telehealth: Payer: Self-pay | Admitting: Gastroenterology

## 2019-07-31 NOTE — Telephone Encounter (Signed)
LVM returning patients call to schedule her colonoscopy.  Informed her we are currently scheduling for out [place] location and we have limited availability.  Thanks [employer]

## 2019-07-31 NOTE — Telephone Encounter (Signed)
Pt left vm to set up her colonoscopy she would appreciate a call back today

## 2019-08-21 ENCOUNTER — Other Ambulatory Visit: Payer: Self-pay | Admitting: Nurse Practitioner

## 2019-08-21 DIAGNOSIS — Z1231 Encounter for screening mammogram for malignant neoplasm of breast: Secondary | ICD-10-CM

## 2019-08-21 DIAGNOSIS — Z23 Encounter for immunization: Secondary | ICD-10-CM | POA: Diagnosis not present

## 2019-08-21 DIAGNOSIS — Z0000 Encounter for general adult medical examination without abnormal findings: Secondary | ICD-10-CM | POA: Diagnosis not present

## 2019-08-21 DIAGNOSIS — Z6831 Body mass index (BMI) 31.0-31.9, adult: Secondary | ICD-10-CM | POA: Diagnosis not present

## 2019-09-01 DIAGNOSIS — Z79899 Other long term (current) drug therapy: Secondary | ICD-10-CM | POA: Diagnosis not present

## 2019-09-21 DIAGNOSIS — M542 Cervicalgia: Secondary | ICD-10-CM | POA: Diagnosis not present

## 2019-09-21 DIAGNOSIS — M5412 Radiculopathy, cervical region: Secondary | ICD-10-CM | POA: Diagnosis not present

## 2019-09-21 DIAGNOSIS — M25511 Pain in right shoulder: Secondary | ICD-10-CM | POA: Diagnosis not present

## 2019-09-26 ENCOUNTER — Other Ambulatory Visit: Payer: Self-pay

## 2019-09-26 ENCOUNTER — Ambulatory Visit
Admission: RE | Admit: 2019-09-26 | Discharge: 2019-09-26 | Disposition: A | Discharge status: Home / Self Care | Payer: BC Managed Care – PPO | Source: Ambulatory Visit | Attending: Nurse Practitioner | Admitting: Nurse Practitioner

## 2019-09-26 DIAGNOSIS — Z1231 Encounter for screening mammogram for malignant neoplasm of breast: Secondary | ICD-10-CM | POA: Diagnosis not present

## 2019-10-03 DIAGNOSIS — M542 Cervicalgia: Secondary | ICD-10-CM | POA: Diagnosis not present

## 2019-10-03 DIAGNOSIS — M25511 Pain in right shoulder: Secondary | ICD-10-CM | POA: Diagnosis not present

## 2019-10-05 ENCOUNTER — Ambulatory Visit: Payer: BC Managed Care – PPO | Admitting: Gastroenterology

## 2019-10-11 ENCOUNTER — Ambulatory Visit: Payer: BC Managed Care – PPO | Admitting: Gastroenterology

## 2019-10-11 DIAGNOSIS — G894 Chronic pain syndrome: Secondary | ICD-10-CM | POA: Diagnosis not present

## 2019-10-11 DIAGNOSIS — M542 Cervicalgia: Secondary | ICD-10-CM | POA: Diagnosis not present

## 2019-10-11 DIAGNOSIS — M5030 Other cervical disc degeneration, unspecified cervical region: Secondary | ICD-10-CM | POA: Diagnosis not present

## 2019-10-11 DIAGNOSIS — M5136 Other intervertebral disc degeneration, lumbar region: Secondary | ICD-10-CM | POA: Diagnosis not present

## 2019-10-19 DIAGNOSIS — M19011 Primary osteoarthritis, right shoulder: Secondary | ICD-10-CM | POA: Diagnosis not present

## 2019-10-19 DIAGNOSIS — M87021 Idiopathic aseptic necrosis of right humerus: Secondary | ICD-10-CM | POA: Diagnosis not present

## 2019-10-19 DIAGNOSIS — M25511 Pain in right shoulder: Secondary | ICD-10-CM | POA: Diagnosis not present

## 2019-11-07 DIAGNOSIS — M5030 Other cervical disc degeneration, unspecified cervical region: Secondary | ICD-10-CM | POA: Diagnosis not present

## 2019-11-07 DIAGNOSIS — M5136 Other intervertebral disc degeneration, lumbar region: Secondary | ICD-10-CM | POA: Diagnosis not present

## 2019-11-12 DIAGNOSIS — Z96612 Presence of left artificial shoulder joint: Secondary | ICD-10-CM | POA: Diagnosis not present

## 2019-11-12 DIAGNOSIS — R519 Headache, unspecified: Secondary | ICD-10-CM | POA: Diagnosis not present

## 2019-11-12 DIAGNOSIS — R220 Localized swelling, mass and lump, head: Secondary | ICD-10-CM | POA: Diagnosis not present

## 2019-11-12 DIAGNOSIS — K0889 Other specified disorders of teeth and supporting structures: Secondary | ICD-10-CM | POA: Diagnosis not present

## 2019-11-12 DIAGNOSIS — L03211 Cellulitis of face: Secondary | ICD-10-CM | POA: Diagnosis not present

## 2019-11-12 DIAGNOSIS — Z6830 Body mass index (BMI) 30.0-30.9, adult: Secondary | ICD-10-CM | POA: Diagnosis not present

## 2019-11-12 DIAGNOSIS — K047 Periapical abscess without sinus: Secondary | ICD-10-CM | POA: Diagnosis not present

## 2019-12-06 ENCOUNTER — Other Ambulatory Visit: Payer: Self-pay | Admitting: Orthopedic Surgery

## 2019-12-06 DIAGNOSIS — M87021 Idiopathic aseptic necrosis of right humerus: Secondary | ICD-10-CM | POA: Diagnosis not present

## 2019-12-16 IMAGING — DX DG SHOULDER 1V*L*
1 series · 1 of 1 positions shown · non-contrast
Comparison: None.

CLINICAL DATA: Status post left shoulder replacement

EXAM:
LEFT SHOULDER - 1 VIEW

[shoulder ap]
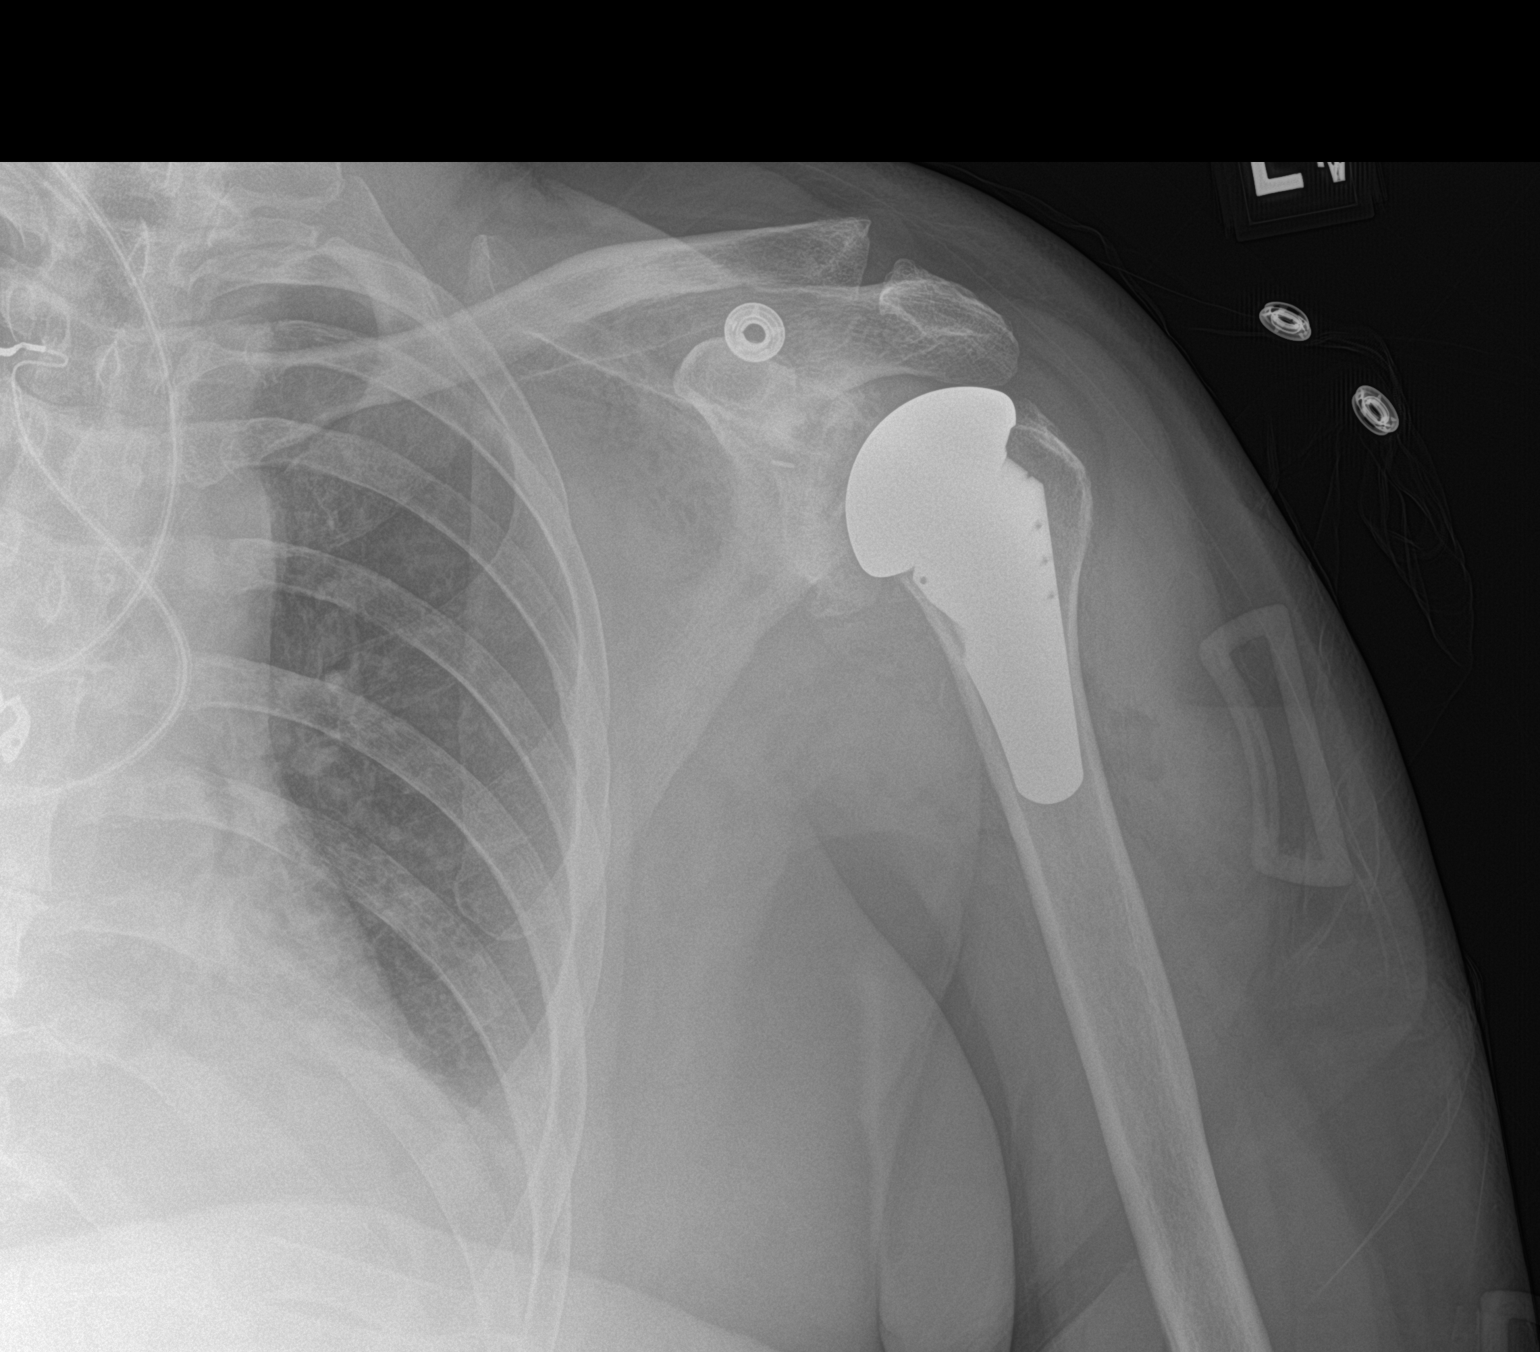

[1 of 1 positions shown; findings below may reference images not displayed]

FINDINGS: Left shoulder prosthesis is now noted in the proximal humerus. It is
well seated in the glenoid. Some degenerative changes of the glenoid
are again seen. No acute bony abnormality is noted.
IMPRESSION: Status post left shoulder replacement.

## 2019-12-19 ENCOUNTER — Other Ambulatory Visit: Payer: Self-pay

## 2019-12-19 ENCOUNTER — Ambulatory Visit
Admission: RE | Admit: 2019-12-19 | Discharge: 2019-12-19 | Disposition: A | Discharge status: Home / Self Care | Payer: BC Managed Care – PPO | Source: Ambulatory Visit | Attending: Orthopedic Surgery | Admitting: Orthopedic Surgery

## 2019-12-19 DIAGNOSIS — M87021 Idiopathic aseptic necrosis of right humerus: Secondary | ICD-10-CM

## 2019-12-19 DIAGNOSIS — M19011 Primary osteoarthritis, right shoulder: Secondary | ICD-10-CM | POA: Diagnosis not present

## 2019-12-29 DIAGNOSIS — M545 Low back pain: Secondary | ICD-10-CM | POA: Diagnosis not present

## 2020-02-05 NOTE — Progress Notes (Signed)
[id number] - [place], [address] [place] [address] Phone: [phone] Fax: [phone]      Your procedure is scheduled on Thursday 02/08/2020.  Report to [facility] Main Entrance "A" at 05:30 A.M., and check in at the Admitting office.  Call this number if you have problems the morning of surgery:  [phone]  Call [phone] if you have any questions prior to your surgery date Monday-Friday 8am-4pm    Remember:  Do not eat after midnight the night before your surgery  You may drink clear liquids until 04:30 the morning of your surgery.   Clear liquids allowed are: Water, Non-Citrus Juices (without pulp), Carbonated Beverages, Clear Tea, Black Coffee Only, and Gatorade    Enhanced Recovery after Surgery for Orthopedics Enhanced Recovery after Surgery is a protocol used to improve the stress on your body and your recovery after surgery.  Patient Instructions  . The night before surgery:  o No food after midnight. ONLY clear liquids after midnight  .  Marland Kitchen The day of surgery (if you do NOT have diabetes):  o Drink ONE (1) Pre-Surgery Clear Ensure by _____ am the morning of surgery   o This drink was given to you during your hospital  pre-op appointment visit. o Nothing else to drink after completing the  Pre-Surgery Clear Ensure.         If you have questions, please contact your surgeon's office.   Take these medicines the morning of surgery with A SIP OF WATER: Famotidine (Pepcid) Methocarbamol (Robaxin) - if needed Tramadol (Ultram) - if needed  As of today, STOP taking any Aspirin (unless otherwise instructed by your surgeon) Aleve, Naproxen, Ibuprofen, Motrin, Advil, Goody's, BC's, all herbal medications, fish oil, and all vitamins.                      Do not wear jewelry, make up, or nail polish            Do not wear lotions, powders, perfumes/colognes, or deodorant.            Do not  shave 48 hours prior to surgery.  Men may shave face and neck.            Do not bring valuables to the hospital.            [facility] is not responsible for any belongings or valuables.  Do NOT Smoke (Tobacco/Vaping) or drink Alcohol 24 hours prior to your procedure  If you use a CPAP at night, you may bring all equipment for your overnight stay.   Contacts, glasses, dentures or bridgework may not be worn into surgery.      For patients admitted to the hospital, discharge time will be determined by your treatment team.   Patients discharged the day of surgery will not be allowed to drive home, and someone needs to stay with them for 24 hours.    Special instructions:    [place]- Preparing for Total Shoulder Arthroplasty  Before surgery, you can play an important role. Because skin is not sterile, your skin needs to be as free of germs as possible. You can reduce the number of germs on your skin by using the following products.   Benzoyl Peroxide Gel  o Reduces the number of germs present on the skin  o Applied twice a day to shoulder area starting two days before surgery  Chlorhexidine Gluconate (CHG) Soap (instructions listed above on how to wash with CHG Soap)  o An antiseptic cleaner that kills germs and bonds with the skin to continue killing germs even after washing  o Used for showering the night before surgery and morning of surgery   ==================================================================  Please follow these instructions carefully:  BENZOYL PEROXIDE 5% GEL  Please do not use if you have an allergy to benzoyl peroxide. If your skin becomes reddened/irritated stop using the benzoyl peroxide.  Starting two days before surgery, apply as follows:  1. Apply benzoyl peroxide in the morning and at night. Apply after taking a shower. If you are not taking a shower clean entire shoulder front, back, and side along with the armpit with a clean wet  washcloth.  2. Place a quarter-sized dollop on your SHOULDER and rub in thoroughly, making sure to cover the front, back, and side of your shoulder, along with the armpit.   2 Days prior to Surgery First Dose on Tuesday Morning 02/06/20 Second Dose on Tuesday Night 02/06/20  Day Before Surgery First Dose on Wednesday Morning 02/07/20 Night before surgery wash (entire body except face and private areas) with CHG Soap THEN Second Dose on Wednesday Night 02/07/20  Morning of Surgery  wash BODY AGAIN with CHG Soap   4. Do NOT apply benzoyl peroxide gel on the day of surgery  [place]- Preparing For Surgery  Before surgery, you can play an important role. Because skin is not sterile, your skin needs to be as free of germs as possible. You can reduce the number of germs on your skin by washing with CHG (chlorahexidine gluconate) Soap before surgery.  CHG is an antiseptic cleaner which kills germs and bonds with the skin to continue killing germs even after washing.    Oral Hygiene is also important to reduce your risk of infection.  Remember - BRUSH YOUR TEETH THE MORNING OF SURGERY WITH YOUR REGULAR TOOTHPASTE  Please do not use if you have an allergy to CHG or antibacterial soaps. If your skin becomes reddened/irritated stop using the CHG.  Do not shave (including legs and underarms) for at least 48 hours prior to first CHG shower. It is OK to shave your face.  Please follow these instructions carefully.   1. Shower the NIGHT BEFORE SURGERY and the MORNING OF SURGERY with CHG Soap.   2. If you chose to wash your hair, wash your hair first as usual with your normal shampoo.  3. After you shampoo, rinse your hair and body thoroughly to remove the shampoo.  4. Use CHG as you would any other liquid soap. You can apply CHG directly to the skin and wash gently with a scrungie or a clean washcloth.   5. Apply the CHG Soap to your body ONLY FROM THE NECK DOWN.  Do not use on open wounds or  open sores. Avoid contact with your eyes, ears, mouth and genitals (private parts). Wash Face and genitals (private parts)  with your normal soap.   6. Wash thoroughly, paying special attention to the area where your surgery will be performed.  7. Thoroughly rinse your body with warm water from the neck down.  8. DO NOT shower/wash with your normal soap after using and rinsing off the CHG Soap.  9. Pat yourself dry with a CLEAN TOWEL.  10. Wear CLEAN PAJAMAS to bed the night before surgery  11. Place CLEAN SHEETS on your bed the night of your first shower  and DO NOT SLEEP WITH PETS.   Day of Surgery: Shower with CHG soap as directed Wear Clean/Comfortable clothing the morning of surgery Do not apply any deodorants/lotions.   Remember to brush your teeth WITH YOUR REGULAR TOOTHPASTE.   Please read over the following fact sheets that you were given.

## 2020-02-06 ENCOUNTER — Encounter (HOSPITAL_COMMUNITY): Payer: Self-pay

## 2020-02-06 ENCOUNTER — Encounter (HOSPITAL_COMMUNITY)
Admission: RE | Admit: 2020-02-06 | Discharge: 2020-02-06 | Disposition: A | Discharge status: Home / Self Care | Payer: BC Managed Care – PPO | Source: Ambulatory Visit | Attending: Orthopedic Surgery | Admitting: Orthopedic Surgery

## 2020-02-06 ENCOUNTER — Other Ambulatory Visit (HOSPITAL_COMMUNITY)
Admission: RE | Admit: 2020-02-06 | Discharge: 2020-02-06 | Disposition: A | Discharge status: Home / Self Care | Payer: BC Managed Care – PPO | Source: Ambulatory Visit | Attending: Orthopedic Surgery | Admitting: Orthopedic Surgery

## 2020-02-06 ENCOUNTER — Other Ambulatory Visit: Payer: Self-pay

## 2020-02-06 DIAGNOSIS — Z01812 Encounter for preprocedural laboratory examination: Secondary | ICD-10-CM | POA: Insufficient documentation

## 2020-02-06 DIAGNOSIS — Z20822 Contact with and (suspected) exposure to covid-19: Secondary | ICD-10-CM | POA: Insufficient documentation

## 2020-02-06 LAB — NO BLOOD PRODUCTS

## 2020-02-06 LAB — CBC
HCT: 43.9 % (ref 36.0–46.0)
Hemoglobin: 14.1 g/dL (ref 12.0–15.0)
MCH: 31.1 pg (ref 26.0–34.0)
MCHC: 32.1 g/dL (ref 30.0–36.0)
MCV: 96.9 fL (ref 80.0–100.0)
Platelets: 203 10*3/uL (ref 150–400)
RBC: 4.53 MIL/uL (ref 3.87–5.11)
RDW: 12.3 % (ref 11.5–15.5)
WBC: 4.8 10*3/uL (ref 4.0–10.5)
nRBC: 0 % (ref 0.0–0.2)

## 2020-02-06 LAB — BASIC METABOLIC PANEL
Anion gap: 7 (ref 5–15)
BUN: 14 mg/dL (ref 6–20)
CO2: 26 mmol/L (ref 22–32)
Calcium: 9.1 mg/dL (ref 8.9–10.3)
Chloride: 107 mmol/L (ref 98–111)
Creatinine, Ser: 0.73 mg/dL (ref 0.44–1.00)
GFR calc Af Amer: >60 mL/min (ref >60)
GFR calc non Af Amer: >60 mL/min (ref >60)
Glucose, Bld: 106 mg/dL — ABNORMAL HIGH (ref 70–99)
Potassium: 4.2 mmol/L (ref 3.5–5.1)
Sodium: 140 mmol/L (ref 135–145)

## 2020-02-06 LAB — SARS CORONAVIRUS 2 (TAT 6-24 HRS): SARS Coronavirus 2: NEGATIVE

## 2020-02-06 LAB — SURGICAL PCR SCREEN
MRSA, PCR: NEGATIVE
Staphylococcus aureus: NEGATIVE

## 2020-02-06 LAB — GLUCOSE, CAPILLARY: Glucose-Capillary: 101 mg/dL — ABNORMAL HIGH (ref 70–99)

## 2020-02-06 NOTE — Progress Notes (Signed)
PCP - [facility], [facility] Cardiologist - patient denies  PPM/ICD - n/a Device Orders -  Rep Notified -   Chest x-ray - n/a EKG - n/a Stress Test - patient denies ECHO - patient denies Cardiac Cath - patient denies  Sleep Study - patient denies CPAP -   Fasting Blood Sugar - n/a Checks Blood Sugar _____ times a day  Blood Thinner Instructions: patient denies Aspirin Instructions:patient denies  ERAS Protcol - clears before 04:30 PRE-SURGERY Ensure or G2- Ensure provided  COVID TEST- COVID test after PAT appointment 02/06/2020, patient educated about quarantine   Anesthesia review: n/a  Patient denies shortness of breath, fever, cough and chest pain at PAT appointment   All instructions explained to the patient, with a verbal understanding of the material. Patient agrees to go over the instructions while at home for a better understanding. Patient also instructed to self quarantine after being tested for COVID-19. The opportunity to ask questions was provided.

## 2020-02-08 ENCOUNTER — Observation Stay (HOSPITAL_COMMUNITY): Payer: BC Managed Care – PPO

## 2020-02-08 ENCOUNTER — Encounter (HOSPITAL_COMMUNITY)
Admission: RE | Disposition: A | Discharge status: Home / Self Care | Payer: Self-pay | Source: Ambulatory Visit | Attending: Orthopedic Surgery

## 2020-02-08 ENCOUNTER — Other Ambulatory Visit: Payer: Self-pay

## 2020-02-08 ENCOUNTER — Observation Stay (HOSPITAL_COMMUNITY)
Admission: RE | Admit: 2020-02-08 | Discharge: 2020-02-09 | Disposition: A | Discharge status: Home / Self Care | Payer: BC Managed Care – PPO | Source: Ambulatory Visit | Attending: Orthopedic Surgery | Admitting: Orthopedic Surgery

## 2020-02-08 ENCOUNTER — Ambulatory Visit (HOSPITAL_COMMUNITY): Payer: BC Managed Care – PPO | Admitting: Anesthesiology

## 2020-02-08 ENCOUNTER — Encounter (HOSPITAL_COMMUNITY): Payer: Self-pay | Admitting: Orthopedic Surgery

## 2020-02-08 DIAGNOSIS — Z96611 Presence of right artificial shoulder joint: Secondary | ICD-10-CM | POA: Diagnosis not present

## 2020-02-08 DIAGNOSIS — Z96612 Presence of left artificial shoulder joint: Secondary | ICD-10-CM | POA: Diagnosis not present

## 2020-02-08 DIAGNOSIS — Z471 Aftercare following joint replacement surgery: Secondary | ICD-10-CM | POA: Diagnosis not present

## 2020-02-08 DIAGNOSIS — M19011 Primary osteoarthritis, right shoulder: Secondary | ICD-10-CM | POA: Diagnosis not present

## 2020-02-08 DIAGNOSIS — G8918 Other acute postprocedural pain: Secondary | ICD-10-CM | POA: Diagnosis not present

## 2020-02-08 DIAGNOSIS — R7303 Prediabetes: Secondary | ICD-10-CM | POA: Insufficient documentation

## 2020-02-08 DIAGNOSIS — Z20822 Contact with and (suspected) exposure to covid-19: Secondary | ICD-10-CM | POA: Insufficient documentation

## 2020-02-08 DIAGNOSIS — K219 Gastro-esophageal reflux disease without esophagitis: Secondary | ICD-10-CM | POA: Diagnosis not present

## 2020-02-08 DIAGNOSIS — M25511 Pain in right shoulder: Secondary | ICD-10-CM | POA: Diagnosis not present

## 2020-02-08 HISTORY — PX: TOTAL SHOULDER ARTHROPLASTY: SHX126

## 2020-02-08 LAB — GLUCOSE, CAPILLARY: Glucose-Capillary: 91 mg/dL (ref 70–99)

## 2020-02-08 SURGERY — ARTHROPLASTY, SHOULDER, TOTAL
Anesthesia: General | Site: Shoulder | Laterality: Right

## 2020-02-08 MED ORDER — PHENYLEPHRINE HCL-NACL 10-0.9 MG/250ML-% IV SOLN
INTRAVENOUS | Status: DC | PRN
  Administered 2020-02-08: 25 ug/min via INTRAVENOUS

## 2020-02-08 MED ORDER — ONDANSETRON HCL 4 MG/2ML IJ SOLN: 4.0000 mg | Freq: Four times a day (QID) | INTRAMUSCULAR | Status: DC | PRN

## 2020-02-08 MED ORDER — CEFAZOLIN SODIUM-DEXTROSE 2-4 GM/100ML-% IV SOLN
2.0000 g | INTRAVENOUS | Status: AC
  Administered 2020-02-08: 2 g via INTRAVENOUS
  Filled 2020-02-08: qty 100

## 2020-02-08 MED ORDER — HYDROCODONE-ACETAMINOPHEN 7.5-325 MG PO TABS
1.0000 | ORAL_TABLET | ORAL | Status: DC | PRN
  Administered 2020-02-08: 1 via ORAL

## 2020-02-08 MED ORDER — HYDROCODONE-ACETAMINOPHEN 5-325 MG PO TABS
1.0000 | ORAL_TABLET | ORAL | Status: DC | PRN
  Administered 2020-02-08 – 2020-02-09 (×2): 2 via ORAL
  Filled 2020-02-08 (×2): qty 2

## 2020-02-08 MED ORDER — MIDAZOLAM HCL 2 MG/2ML IJ SOLN
INTRAMUSCULAR | Status: AC
  Filled 2020-02-08: qty 2

## 2020-02-08 MED ORDER — LIDOCAINE 2% (20 MG/ML) 5 ML SYRINGE
INTRAMUSCULAR | Status: DC | PRN
  Administered 2020-02-08: 60 mg via INTRAVENOUS

## 2020-02-08 MED ORDER — ONDANSETRON HCL 4 MG/2ML IJ SOLN
INTRAMUSCULAR | Status: DC | PRN
  Administered 2020-02-08: 4 mg via INTRAVENOUS

## 2020-02-08 MED ORDER — HYDROCODONE-ACETAMINOPHEN 7.5-325 MG PO TABS
ORAL_TABLET | ORAL | Status: AC
  Filled 2020-02-08: qty 1

## 2020-02-08 MED ORDER — VANCOMYCIN HCL 1000 MG IV SOLR
INTRAVENOUS | Status: DC | PRN
  Administered 2020-02-08: 1000 mg

## 2020-02-08 MED ORDER — CEFAZOLIN SODIUM-DEXTROSE 1-4 GM/50ML-% IV SOLN
1.0000 g | Freq: Four times a day (QID) | INTRAVENOUS | Status: AC
  Administered 2020-02-08 – 2020-02-09 (×3): 1 g via INTRAVENOUS
  Filled 2020-02-08 (×3): qty 50

## 2020-02-08 MED ORDER — HYDROCODONE-ACETAMINOPHEN 5-325 MG PO TABS
ORAL_TABLET | ORAL | Status: AC
  Filled 2020-02-08: qty 2

## 2020-02-08 MED ORDER — FENTANYL CITRATE (PF) 250 MCG/5ML IJ SOLN
INTRAMUSCULAR | Status: AC
  Filled 2020-02-08: qty 5

## 2020-02-08 MED ORDER — SUGAMMADEX SODIUM 200 MG/2ML IV SOLN
INTRAVENOUS | Status: DC | PRN
  Administered 2020-02-08: 175 mg via INTRAVENOUS

## 2020-02-08 MED ORDER — STERILE WATER FOR IRRIGATION IR SOLN
Status: DC | PRN
  Administered 2020-02-08: 1000 mL

## 2020-02-08 MED ORDER — ONDANSETRON HCL 4 MG/2ML IJ SOLN: 4.0000 mg | Freq: Once | INTRAMUSCULAR | Status: DC | PRN

## 2020-02-08 MED ORDER — MIDAZOLAM HCL 5 MG/5ML IJ SOLN
INTRAMUSCULAR | Status: DC | PRN
  Administered 2020-02-08 (×2): 1 mg via INTRAVENOUS

## 2020-02-08 MED ORDER — LACTATED RINGERS IV SOLN: INTRAVENOUS | Status: DC | PRN

## 2020-02-08 MED ORDER — FAMOTIDINE 20 MG PO TABS
20.0000 mg | ORAL_TABLET | Freq: Every day | ORAL | Status: DC
  Administered 2020-02-08 – 2020-02-09 (×2): 20 mg via ORAL
  Filled 2020-02-08 (×2): qty 1

## 2020-02-08 MED ORDER — METHOCARBAMOL 500 MG PO TABS
500.0000 mg | ORAL_TABLET | Freq: Four times a day (QID) | ORAL | Status: DC | PRN
  Administered 2020-02-09: 500 mg via ORAL
  Filled 2020-02-08: qty 1

## 2020-02-08 MED ORDER — ACETAMINOPHEN 325 MG PO TABS
325.0000 mg | ORAL_TABLET | Freq: Four times a day (QID) | ORAL | Status: DC | PRN
  Administered 2020-02-09: 650 mg via ORAL
  Filled 2020-02-08: qty 2

## 2020-02-08 MED ORDER — ONDANSETRON HCL 4 MG/2ML IJ SOLN
INTRAMUSCULAR | Status: AC
  Filled 2020-02-08: qty 2

## 2020-02-08 MED ORDER — METOCLOPRAMIDE HCL 5 MG/ML IJ SOLN: 5.0000 mg | Freq: Three times a day (TID) | INTRAMUSCULAR | Status: DC | PRN

## 2020-02-08 MED ORDER — ACETAMINOPHEN 500 MG PO TABS
500.0000 mg | ORAL_TABLET | Freq: Four times a day (QID) | ORAL | Status: AC
  Administered 2020-02-08 – 2020-02-09 (×4): 500 mg via ORAL
  Filled 2020-02-08 (×4): qty 1

## 2020-02-08 MED ORDER — MENTHOL 3 MG MT LOZG: 1.0000 | LOZENGE | OROMUCOSAL | Status: DC | PRN

## 2020-02-08 MED ORDER — PHENOL 1.4 % MT LIQD: 1.0000 | OROMUCOSAL | Status: DC | PRN

## 2020-02-08 MED ORDER — OXYCODONE HCL 5 MG PO TABS: 5.0000 mg | ORAL_TABLET | Freq: Once | ORAL | Status: DC | PRN

## 2020-02-08 MED ORDER — VANCOMYCIN HCL 1000 MG IV SOLR
INTRAVENOUS | Status: AC
  Filled 2020-02-08: qty 1000

## 2020-02-08 MED ORDER — ROCURONIUM BROMIDE 10 MG/ML (PF) SYRINGE
PREFILLED_SYRINGE | INTRAVENOUS | Status: DC | PRN
  Administered 2020-02-08: 50 mg via INTRAVENOUS
  Administered 2020-02-08: 20 mg via INTRAVENOUS

## 2020-02-08 MED ORDER — FENTANYL CITRATE (PF) 100 MCG/2ML IJ SOLN: 25.0000 ug | INTRAMUSCULAR | Status: DC | PRN

## 2020-02-08 MED ORDER — DOCUSATE SODIUM 100 MG PO CAPS
100.0000 mg | ORAL_CAPSULE | Freq: Two times a day (BID) | ORAL | Status: DC
  Administered 2020-02-08 – 2020-02-09 (×3): 100 mg via ORAL
  Filled 2020-02-08 (×3): qty 1

## 2020-02-08 MED ORDER — GABAPENTIN 100 MG PO CAPS
100.0000 mg | ORAL_CAPSULE | Freq: Every day | ORAL | Status: DC
  Administered 2020-02-08: 100 mg via ORAL
  Filled 2020-02-08: qty 1

## 2020-02-08 MED ORDER — DEXAMETHASONE SODIUM PHOSPHATE 10 MG/ML IJ SOLN
INTRAMUSCULAR | Status: AC
  Filled 2020-02-08: qty 1

## 2020-02-08 MED ORDER — CHLORHEXIDINE GLUCONATE 0.12 % MT SOLN
15.0000 mL | Freq: Once | OROMUCOSAL | Status: AC
  Administered 2020-02-08: 15 mL via OROMUCOSAL
  Filled 2020-02-08: qty 15

## 2020-02-08 MED ORDER — PHENYLEPHRINE 40 MCG/ML (10ML) SYRINGE FOR IV PUSH (FOR BLOOD PRESSURE SUPPORT)
PREFILLED_SYRINGE | INTRAVENOUS | Status: AC
  Filled 2020-02-08: qty 10

## 2020-02-08 MED ORDER — PROPOFOL 10 MG/ML IV BOLUS
INTRAVENOUS | Status: DC | PRN
  Administered 2020-02-08: 150 mg via INTRAVENOUS

## 2020-02-08 MED ORDER — LACTATED RINGERS IV SOLN: INTRAVENOUS | Status: DC

## 2020-02-08 MED ORDER — FENTANYL CITRATE (PF) 250 MCG/5ML IJ SOLN
INTRAMUSCULAR | Status: DC | PRN
  Administered 2020-02-08 (×2): 50 ug via INTRAVENOUS

## 2020-02-08 MED ORDER — LIDOCAINE 2% (20 MG/ML) 5 ML SYRINGE
INTRAMUSCULAR | Status: AC
  Filled 2020-02-08: qty 5

## 2020-02-08 MED ORDER — DEXAMETHASONE SODIUM PHOSPHATE 10 MG/ML IJ SOLN
INTRAMUSCULAR | Status: DC | PRN
  Administered 2020-02-08: 5 mg via INTRAVENOUS

## 2020-02-08 MED ORDER — TRANEXAMIC ACID-NACL 1000-0.7 MG/100ML-% IV SOLN
1000.0000 mg | Freq: Once | INTRAVENOUS | Status: AC
  Administered 2020-02-08: 1000 mg via INTRAVENOUS
  Filled 2020-02-08: qty 100

## 2020-02-08 MED ORDER — PROPOFOL 10 MG/ML IV BOLUS
INTRAVENOUS | Status: AC
  Filled 2020-02-08: qty 40

## 2020-02-08 MED ORDER — MORPHINE SULFATE (PF) 2 MG/ML IV SOLN: 0.5000 mg | INTRAVENOUS | Status: DC | PRN

## 2020-02-08 MED ORDER — ORAL CARE MOUTH RINSE: 15.0000 mL | Freq: Once | OROMUCOSAL | Status: AC

## 2020-02-08 MED ORDER — 0.9 % SODIUM CHLORIDE (POUR BTL) OPTIME
TOPICAL | Status: DC | PRN
  Administered 2020-02-08: 1000 mL

## 2020-02-08 MED ORDER — METOCLOPRAMIDE HCL 5 MG PO TABS: 5.0000 mg | ORAL_TABLET | Freq: Three times a day (TID) | ORAL | Status: DC | PRN

## 2020-02-08 MED ORDER — METHOCARBAMOL 1000 MG/10ML IJ SOLN
500.0000 mg | Freq: Four times a day (QID) | INTRAVENOUS | Status: DC | PRN
  Filled 2020-02-08: qty 5

## 2020-02-08 MED ORDER — PHENYLEPHRINE HCL (PRESSORS) 10 MG/ML IV SOLN
INTRAVENOUS | Status: DC | PRN
  Administered 2020-02-08: 80 ug via INTRAVENOUS

## 2020-02-08 MED ORDER — OXYCODONE HCL 5 MG/5ML PO SOLN: 5.0000 mg | Freq: Once | ORAL | Status: DC | PRN

## 2020-02-08 MED ORDER — ONDANSETRON HCL 4 MG PO TABS
4.0000 mg | ORAL_TABLET | Freq: Four times a day (QID) | ORAL | Status: DC | PRN
  Administered 2020-02-08: 4 mg via ORAL
  Filled 2020-02-08: qty 1

## 2020-02-08 MED ORDER — TRANEXAMIC ACID-NACL 1000-0.7 MG/100ML-% IV SOLN
1000.0000 mg | INTRAVENOUS | Status: AC
  Administered 2020-02-08: 1000 mg via INTRAVENOUS
  Filled 2020-02-08: qty 100

## 2020-02-08 MED ORDER — ROCURONIUM BROMIDE 10 MG/ML (PF) SYRINGE
PREFILLED_SYRINGE | INTRAVENOUS | Status: AC
  Filled 2020-02-08: qty 10

## 2020-02-08 SURGICAL SUPPLY — 63 items
AID PSTN UNV HD RSTRNT DISP (MISCELLANEOUS) ×1
ALCOHOL 70% 16 OZ (MISCELLANEOUS) ×2 IMPLANT
BIT DRILL 5/64X5 DISP (BIT) IMPLANT
BLADE SAG 18X100X1.27 (BLADE) ×2 IMPLANT
CALIBRATOR GLENOID VIP 5-D (SYSTAGENIX WOUND MANAGEMENT) ×1 IMPLANT
CEMENT BONE R 1X40 (Cement) ×2 IMPLANT
CLSR STERI-STRIP ANTIMIC 1/2X4 (GAUZE/BANDAGES/DRESSINGS) ×1 IMPLANT
COVER SURGICAL LIGHT HANDLE (MISCELLANEOUS) ×2 IMPLANT
COVER WAND RF STERILE (DRAPES) ×2 IMPLANT
DRAPE INCISE IOBAN 66X45 STRL (DRAPES) ×2 IMPLANT
DRAPE ORTHO SPLIT 77X108 STRL (DRAPES) ×4
DRAPE SURG ORHT 6 SPLT 77X108 (DRAPES) ×2 IMPLANT
DRAPE U-SHAPE 47X51 STRL (DRAPES) ×2 IMPLANT
DRSG ADAPTIC 3X8 NADH LF (GAUZE/BANDAGES/DRESSINGS) ×2 IMPLANT
DRSG AQUACEL AG ADV 3.5X10 (GAUZE/BANDAGES/DRESSINGS) ×1 IMPLANT
DRSG EMULSION OIL 3X3 NADH (GAUZE/BANDAGES/DRESSINGS) ×1 IMPLANT
DRSG PAD ABDOMINAL 8X10 ST (GAUZE/BANDAGES/DRESSINGS) ×2 IMPLANT
DURAPREP 26ML APPLICATOR (WOUND CARE) ×2 IMPLANT
ELECT BLADE 4.0 EZ CLEAN MEGAD (MISCELLANEOUS) ×2
ELECT REM PT RETURN 9FT ADLT (ELECTROSURGICAL) ×2
ELECTRODE BLDE 4.0 EZ CLN MEGD (MISCELLANEOUS) ×1 IMPLANT
ELECTRODE REM PT RTRN 9FT ADLT (ELECTROSURGICAL) ×1 IMPLANT
GAUZE SPONGE 4X4 12PLY STRL (GAUZE/BANDAGES/DRESSINGS) ×2 IMPLANT
GLENOID WITH CLEAT MEDIUM (Shoulder) ×1 IMPLANT
GLOVE BIO SURGEON STRL SZ7.5 (GLOVE) ×2 IMPLANT
GLOVE BIOGEL PI IND STRL 8 (GLOVE) ×1 IMPLANT
GLOVE BIOGEL PI INDICATOR 8 (GLOVE) ×1
GOWN STRL REUS W/ TWL LRG LVL3 (GOWN DISPOSABLE) IMPLANT
GOWN STRL REUS W/ TWL XL LVL3 (GOWN DISPOSABLE) ×2 IMPLANT
GOWN STRL REUS W/TWL LRG LVL3 (GOWN DISPOSABLE)
GOWN STRL REUS W/TWL XL LVL3 (GOWN DISPOSABLE) ×4
HEAD HUMERAL USP II 44/17 (Head) ×1 IMPLANT
KIT BASIN OR (CUSTOM PROCEDURE TRAY) ×2 IMPLANT
KIT SUTURE APEX UNIVERSE (KITS) ×1 IMPLANT
KIT TURNOVER KIT B (KITS) ×2 IMPLANT
MANIFOLD NEPTUNE II (INSTRUMENTS) ×2 IMPLANT
NDL TAPERED W/ NITINOL LOOP (MISCELLANEOUS) ×1 IMPLANT
NEEDLE TAPERED W/ NITINOL LOOP (MISCELLANEOUS) ×2 IMPLANT
NS IRRIG 1000ML POUR BTL (IV SOLUTION) ×2 IMPLANT
PACK SHOULDER (CUSTOM PROCEDURE TRAY) ×2 IMPLANT
PAD ARMBOARD 7.5X6 YLW CONV (MISCELLANEOUS) ×4 IMPLANT
PIN NITINOL TARGETER 2.8 (PIN) ×1 IMPLANT
RESTRAINT HEAD UNIVERSAL NS (MISCELLANEOUS) ×2 IMPLANT
SLING ARM FOAM STRAP LRG (SOFTGOODS) IMPLANT
SLING ARM FOAM STRAP MED (SOFTGOODS) ×1 IMPLANT
SMARTMIX MINI TOWER (MISCELLANEOUS) ×2
SPONGE LAP 18X18 RF (DISPOSABLE) ×2 IMPLANT
SPONGE LAP 4X18 RFD (DISPOSABLE) IMPLANT
STEM APEX UNIVERSAL 6MM SHOULD (Stem) ×1 IMPLANT
STRIP CLOSURE SKIN 1/2X4 (GAUZE/BANDAGES/DRESSINGS) ×2 IMPLANT
SUCTION FRAZIER HANDLE 10FR (MISCELLANEOUS) ×2
SUCTION TUBE FRAZIER 10FR DISP (MISCELLANEOUS) ×1 IMPLANT
SUT FIBERWIRE #2 38 T-5 BLUE (SUTURE) ×4
SUT MNCRL AB 4-0 PS2 18 (SUTURE) ×2 IMPLANT
SUT VIC AB 0 CT1 27 (SUTURE) ×2
SUT VIC AB 0 CT1 27XBRD ANBCTR (SUTURE) IMPLANT
SUT VIC AB 2-0 CT1 27 (SUTURE) ×4
SUT VIC AB 2-0 CT1 TAPERPNT 27 (SUTURE) ×1 IMPLANT
SUTURE FIBERWR #2 38 T-5 BLUE (SUTURE) ×2 IMPLANT
TOWEL GREEN STERILE (TOWEL DISPOSABLE) ×2 IMPLANT
TOWER SMARTMIX MINI (MISCELLANEOUS) IMPLANT
WATER STERILE IRR 1000ML POUR (IV SOLUTION) ×2 IMPLANT
YANKAUER SUCT BULB TIP NO VENT (SUCTIONS) ×2 IMPLANT

## 2020-02-08 NOTE — Anesthesia Procedure Notes (Signed)

## 2020-02-08 NOTE — Brief Op Note (Signed)
02/08/2020  9:58 AM  PATIENT:  Rogina Schiano  59 y.o. female  PRE-OPERATIVE DIAGNOSIS:  Right shoulder osteoarthritis  POST-OPERATIVE DIAGNOSIS:  Right shoulder osteoarthritis  PROCEDURE:  Procedure(s) with comments: TOTAL SHOULDER ARTHROPLASTY (Right) - 2.5 hrs RNFA  SURGEON:  Surgeon(s) and Role:    * Stann Mainland, Elly Modena, MD - Primary  PHYSICIAN ASSISTANT:   ASSISTANTS: Ky Barban, RNFA   ANESTHESIA:   regional and general  EBL:  75 mL   BLOOD ADMINISTERED:none  DRAINS: none   LOCAL MEDICATIONS USED:  NONE  SPECIMEN:  No Specimen  DISPOSITION OF SPECIMEN:  N/A  COUNTS:  YES  TOURNIQUET:  * No tourniquets in log *  DICTATION: .Note written in EPIC  PLAN OF CARE: Admit to inpatient   PATIENT DISPOSITION:  PACU - hemodynamically stable.   Delay start of Pharmacological VTE agent (>24hrs) due to surgical blood loss or risk of bleeding: not applicable

## 2020-02-08 NOTE — Anesthesia Procedure Notes (Signed)
Anesthesia Regional Block: Interscalene brachial plexus block   Pre-Anesthetic Checklist: ,, timeout performed, Correct Patient, Correct Site, Correct Laterality, Correct Procedure, Correct Position, site marked, Risks and benefits discussed,  Surgical consent,  Pre-op evaluation,  At surgeon's request and post-op pain management  Laterality: Right  Prep: chloraprep       Needles:  Injection technique: Single-shot  Needle Type: Stimulator Needle - 40      Needle Gauge: 22     Additional Needles:   Procedures:,,,, ultrasound used (permanent image in chart),,,,  Narrative:  Start time: 02/08/2020 7:00 AM End time: 02/08/2020 7:10 AM Injection made incrementally with aspirations every 5 mL.  Performed by: Personally  Anesthesiologist: Roberts Gaudy, MD  Additional Notes: 30 cc 0.5% Bupivacaine 1:200 epi injected easily 20 cc 1.3% Exparel

## 2020-02-08 NOTE — Anesthesia Preprocedure Evaluation (Signed)
Anesthesia Evaluation  Patient identified by MRN, date of birth, ID band Patient awake    Reviewed: Allergy & Precautions, NPO status , Patient's Chart, lab work & pertinent test results  Airway Mallampati: II  TM Distance: >3 FB Neck ROM: Full    Dental  (+) Teeth Intact, Dental Advisory Given   Pulmonary    breath sounds clear to auscultation       Cardiovascular  Rhythm:Regular Rate:Normal     Neuro/Psych    GI/Hepatic   Endo/Other    Renal/GU      Musculoskeletal   Abdominal   Peds  Hematology   Anesthesia Other Findings   Reproductive/Obstetrics                             Anesthesia Physical Anesthesia Plan  ASA: II  Anesthesia Plan: General   Post-op Pain Management:  Regional for Post-op pain   Induction: Intravenous  PONV Risk Score and Plan: Ondansetron and Dexamethasone  Airway Management Planned: Oral ETT  Additional Equipment: Arterial line  Intra-op Plan:   Post-operative Plan: Extubation in OR  Informed Consent: I have reviewed the patients History and Physical, chart, labs and discussed the procedure including the risks, benefits and alternatives for the proposed anesthesia with the patient or authorized representative who has indicated his/her understanding and acceptance.     Dental advisory given  Plan Discussed with: CRNA and Anesthesiologist  Anesthesia Plan Comments:         Anesthesia Quick Evaluation

## 2020-02-08 NOTE — H&P (Signed)
ORTHOPAEDIC H and P  REQUESTING PHYSICIAN: Nicholes Stairs, MD  PCP:  Philmore Pali, NP  Chief Complaint: right shoulder OA  HPI: Stacey [name] is a 59 y.o. female who complains of recalcitrant right shoulder pain.  She is here today for definitive management with total shoulder replacement.  No new complaints at this time.  Past Medical History:  Diagnosis Date  . Arthritis    "left shoulder, neck, lower back"  (01/11/2018)  . Chronic lower back pain   . Family history of adverse reaction to anesthesia    Mother has nausea  . GERD (gastroesophageal reflux disease)   . PONV (postoperative nausea and vomiting)   . Pre-diabetes    Past Surgical History:  Procedure Laterality Date  . APPENDECTOMY    . JOINT REPLACEMENT    . TOTAL SHOULDER ARTHROPLASTY Left 01/11/2018  . TOTAL SHOULDER ARTHROPLASTY Left 01/11/2018   Procedure: LEFT TOTAL SHOULDER ARTHROPLASTY;  Surgeon: Nicholes Stairs, MD;  Location: [facility];  Service: Orthopedics;  Laterality: Left;  2.5 hrs   Social History   Socioeconomic History  . Marital status: Married    Spouse name: Not on file  . Number of children: Not on file  . Years of education: Not on file  . Highest education level: Not on file  Occupational History  . Not on file  Tobacco Use  . Smoking status: Never Smoker  . Smokeless tobacco: Never Used  Vaping Use  . Vaping Use: Never used  Substance and Sexual Activity  . Alcohol use: Yes    Comment: 01/11/2018 "might have 1 drink/month"  . Drug use: Never  . Sexual activity: Not on file  Other Topics Concern  . Not on file  Social History Narrative  . Not on file   Social Determinants of Health   Financial Resource Strain:   . Difficulty of Paying Living Expenses:   Food Insecurity:   . Worried About Charity fundraiser in the Last Year:   . Arboriculturist in the Last Year:   Transportation Needs:   . Film/video editor (Medical):   Marland Kitchen Lack of Transportation  (Non-Medical):   Physical Activity:   . Days of Exercise per Week:   . Minutes of Exercise per Session:   Stress:   . Feeling of Stress :   Social Connections:   . Frequency of Communication with Friends and Family:   . Frequency of Social Gatherings with Friends and Family:   . Attends Religious Services:   . Active Member of Clubs or Organizations:   . Attends Archivist Meetings:   Marland Kitchen Marital Status:    Family History  Problem Relation Age of Onset  . Hypertension Mother   . Heart disease Father   . Heart attack Father   . CAD Father    Allergies  Allergen Reactions  . Propofol Nausea Only   Prior to Admission medications   Medication Sig Start Date End Date Taking? Authorizing Provider  calcium carbonate (TUMS - DOSED IN MG ELEMENTAL CALCIUM) 500 MG chewable tablet Chew 1 tablet by mouth daily as needed for indigestion or heartburn.   Yes [provider]  famotidine (PEPCID) 20 MG tablet Take 20 mg by mouth daily.   Yes [provider]  gabapentin (NEURONTIN) 100 MG capsule Take 100 mg by mouth at bedtime.   Yes [provider]  methocarbamol (ROBAXIN) 500 MG tablet Take 500 mg by mouth every 8 (  eight) hours as needed for muscle spasms.  10/15/17  Yes [provider]  traMADol (ULTRAM) 50 MG tablet Take 50 mg by mouth every 6 (six) hours as needed (pain).  11/25/17  Yes [provider]   No results found.  Positive ROS: All other systems have been reviewed and were otherwise negative with the exception of those mentioned in the HPI and as above.  Physical Exam: General: Alert, no acute distress Cardiovascular: No pedal edema Respiratory: No cyanosis, no use of accessory musculature GI: No organomegaly, abdomen is soft and non-tender Skin: No lesions in the area of chief complaint Neurologic: Sensation intact distally Psychiatric: Patient is competent for consent with normal mood and affect Lymphatic: No axillary or  cervical lymphadenopathy  MUSCULOSKELETAL: Right upper extremity: No open wounds.  Neurovascularly intact.  Assessment: Right shoulder end-stage glenohumeral osteoarthritis  Plan: -Plan for total shoulder arthroplasty.  We have previously discussed this at length in the office.  She has had a history of left total shoulder arthroplasty and doing quite well.  -We specifically discussed the risk of bleeding, infection, damage to surrounding nerves and vessels, development of stiffness, periprosthetic fracture, dislocation, need for revision surgery, and the risk of anesthesia.  She has provided informed consent.  -We will plan to admit postoperatively for overnight monitoring.    Nicholes Stairs, MD Cell [phone]    02/08/2020 7:09 AM

## 2020-02-08 NOTE — Transfer of Care (Signed)
Immediate Anesthesia Transfer of Care Note  Patient: Stacey [name]  Procedure(s) Performed: TOTAL SHOULDER ARTHROPLASTY (Right Shoulder)  Patient Location: PACU  Anesthesia Type:General  Level of Consciousness: drowsy, patient cooperative and responds to stimulation  Airway & Oxygen Therapy: Patient Spontanous Breathing and Patient connected to face mask oxygen  Post-op Assessment: Report given to RN and Post -op Vital signs reviewed and stable  Post vital signs: Reviewed and stable  Last Vitals:  Vitals Value Taken Time  BP 157/85 02/08/20 1006  Temp    Pulse 62 02/08/20 1006  Resp 15 02/08/20 1006  SpO2 100 % 02/08/20 1006  Vitals shown include unvalidated device data.  Last Pain:  Vitals:   02/08/20 0559  TempSrc: Oral  PainSc:       Patients Stated Pain Goal: 3 (63/33/54 5625)  Complications: No complications documented.

## 2020-02-08 NOTE — Op Note (Signed)
Date of Surgery: 02/08/2020  INDICATIONS: Stacey [name] is a 59 y.o.-year-old female with a right shoulder osteoarthritis;  The patient did consent to the procedure after discussion of the risks and benefits.  PREOPERATIVE DIAGNOSIS: Right shoulder end stage osteoarthritis  POSTOPERATIVE DIAGNOSIS: Same.  PROCEDURE:  1.  Transfer of right long head of biceps tendon to pectoralis major tendon 2.  Right total shoulder arthroplasty  SURGEON: Geralynn Rile, M.D.  ASSIST: Ky Barban, RNFA.  ANESTHESIA:  general, regional  IV FLUIDS AND URINE: See anesthesia.  ESTIMATED BLOOD LOSS: 75 mL.  IMPLANTS:  Arthrex TSA 6 stem with apex subscap repair kit 44/17 humeral head Medium poly  DRAINS: none  COMPLICATIONS: None.  DESCRIPTION OF PROCEDURE: After obtaining informed consent,The patient was brought to the operating room and placed supine on the operating table.  The patient had been signed prior to the procedure and this was documented. The patient had the anesthesia placed by the anesthesiologist.  A time-out was performed to confirm that this was the correct patient, site, side and location. The patient did receive antibiotics prior to the incision and was re-dosed during the procedure as needed at indicated intervals.   The patient had the operative extremity prepped and draped in the standard surgical fashion.   The head and neck were nicely stabilized.   A 10 blade was used to make a standard deltopectoral approach to the shoulder.  Dissection was carried out through the subcutaneous tissue to where the deltopectoral interval was visualized and developed.  The cephalic vein was mobilized and retracted laterally for the duration of the case.  The subacromial space was cleared bluntly retractors were placed appropriately deep to the pectoralis major tendon and deep to the deltoid muscle fascia.  The upper one third of the pectoralis muscle insertion on the humerus was sharply released.   There were no rotator cuff tears.  Next I performed a subscapularis takedown and a peel fashion.  Subscapularis, rotator interval, and the inferior capsule were all released.  There were inferior humeral osteophytes that were removed with rongeur and curved osteotome.  After, Releases, a humeral head osteotomy was performed in 30 of retroversion and at 135 head neck angle.  A protractor was placed within the glenoid vault to protect axillary nerve and posterior capsular structures.  Next we prepared the humerus.  Humeral canal was first reamed up to a size 7 reamer.  Next maintaining 30 of retroversion we broached up to a size 6 mm Apex stem And this did have excellent press fit and rotational stability.  We then placed a humeral head protector and turned our attention to preparing the glenoid.  Retractors were placed anteriorly, superiorly, and posteriorly and the labrum was excised.  Exposure of the glenoid was obtained.  The center drill bit was used followed by sequential reaming.  WeNext using the pegged glenoid guide the holes were drilled appropriately.  The medium size glenoid proved to be the appropriate size for this glenoid.  Next the holes were drilled and chiseled appropriately.  Glenoid  was washed with antibiotic irrigation.  Next glenoid vault was dried.  We trialed once again and were satisfied with the medium sized the glenoid.  Next, using antibodies cementing the superior and inferior holes the glenoid was cemented into place.  Of note, the center ball-like peg was filled with humeral head autograft and not cemented per the manufacturer's recommendation.  Excellent fixation of the glenoid was obtained.   We then turned our  attention back to the humeral side.  The humeral stem was found to still be rotationally stable and the final stem was opened at the above size.  2 drill holes were placed in the bicipital tuberosity for subscapularis repair.  The apex suture fixation technique was  prepared on the back table.  The stem was malleted into place with excellent fixation.  The Arthrex guide was then used to secure the collar at the appropriate inclination on the humeral stem.  We then trialed humeral heads and found the 44 x 17 mm humeral head to be satisfactory.  This demonstrated posterior pushback of just at 50% with spontaneous reduction and likewise inferior pull down of 50% with spontaneous reduction.  That head was then removed and the final humeral head was malleted in place with the above size.   Lastly we turned our attention to the subscapularis repair.  The shoulder was copiously irrigated once again with antibiotic solution.  The apex subscapularis repair technique was then performed.  Multiple sutures were then passed around the stem prior to tapping it into place.  Using the apex technique all sutures were sequentially tied per the manufacturer recommendation.  The medial knots were secured, giving excellent repair to the subscapularis.  A #2 FiberWire was then used to close the rotator interval.  This was done in 45 of abduction and external rotation.   Patient's biceps tendon was abraded and fixed to the pectoralis major insertion with a #2 FiberWire in a figure-of-eight fashion.  The wound was once again copious bleed irrigated using antibiotic solution and then normal saline.  We evaluated once again for hemostasis.  The wound was closed in layers with a #2 FiberWire in the deltopectoral interval.  2-0 Vicryl for the deep dermal layer and 3-0 Monocryl in a subcuticular fashion followed by Dermabond glue.  A standard sterile occlusive dressing was applied as well as a postoperative sling.   The patient tolerated the procedure well.  All counts were correct 2.  There were no intraoperative complications.  The patient was transferred to PACU in stable condition.   Disposition: The patient will be nonweightbearing to the operative extremity for proximally 4-6 weeks.  He  will begin physical therapy in the outpatient setting.  She will be admitted for routine postoperative care including antibiotics, pain control, and DVT prophylaxis.  Sling will be maintained at all times.  Return for wound check in approximately 2 weeks and then again at 6 weeks.

## 2020-02-08 NOTE — Anesthesia Postprocedure Evaluation (Signed)
Anesthesia Post Note  Patient: Stacey [name]  Procedure(s) Performed: TOTAL SHOULDER ARTHROPLASTY (Right Shoulder)     Patient location during evaluation: PACU Anesthesia Type: General Level of consciousness: awake and alert Pain management: pain level controlled Vital Signs Assessment: post-procedure vital signs reviewed and stable Respiratory status: spontaneous breathing, nonlabored ventilation, respiratory function stable and patient connected to nasal cannula oxygen Cardiovascular status: blood pressure returned to baseline and stable Postop Assessment: no apparent nausea or vomiting Anesthetic complications: no   No complications documented.  Last Vitals:  Vitals:   02/08/20 1015 02/08/20 1030  BP: (!) 140/78 (!) 149/71  Pulse: 59 62  Resp: 13 17  Temp:    SpO2: 100% 100%    Last Pain:  Vitals:   02/08/20 1006  TempSrc:   PainSc: Asleep                 Donavyn Fecher COKER

## 2020-02-09 ENCOUNTER — Encounter (HOSPITAL_COMMUNITY): Payer: Self-pay | Admitting: Orthopedic Surgery

## 2020-02-09 DIAGNOSIS — M19011 Primary osteoarthritis, right shoulder: Secondary | ICD-10-CM | POA: Diagnosis not present

## 2020-02-09 DIAGNOSIS — R7303 Prediabetes: Secondary | ICD-10-CM | POA: Diagnosis not present

## 2020-02-09 DIAGNOSIS — Z20822 Contact with and (suspected) exposure to covid-19: Secondary | ICD-10-CM | POA: Diagnosis not present

## 2020-02-09 DIAGNOSIS — Z96612 Presence of left artificial shoulder joint: Secondary | ICD-10-CM | POA: Diagnosis not present

## 2020-02-09 DIAGNOSIS — M25511 Pain in right shoulder: Secondary | ICD-10-CM | POA: Diagnosis not present

## 2020-02-09 MED ORDER — ONDANSETRON 4 MG PO TBDP: 4.0000 mg | ORAL_TABLET | Freq: Three times a day (TID) | ORAL | 0 refills | Status: DC | PRN

## 2020-02-09 MED ORDER — OXYCODONE HCL 5 MG PO TABS: 5.0000 mg | ORAL_TABLET | ORAL | 0 refills | Status: AC | PRN

## 2020-02-09 NOTE — Discharge Summary (Signed)
Patient ID: Stacey [name] MRN: [id number] DOB/AGE: 12-11-1960 59 y.o.  Admit date: 02/08/2020 Discharge date: 02/09/2020  Primary Diagnosis: Right shoulder osteoarthritis  Admission Diagnoses:  Past Medical History:  Diagnosis Date  . Arthritis    "left shoulder, neck, lower back"  (01/11/2018)  . Chronic lower back pain   . Family history of adverse reaction to anesthesia    Mother has nausea  . GERD (gastroesophageal reflux disease)   . PONV (postoperative nausea and vomiting)   . Pre-diabetes    Discharge Diagnoses:   Active Problems:   Hx of total shoulder replacement, right  Estimated body mass index is 33.33 kg/m as calculated from the following:   Height as of this encounter: 5\' 4"  (1.626 m).   Weight as of this encounter: 88.1 kg.  Procedure:  Procedure(s) (LRB): TOTAL SHOULDER ARTHROPLASTY (Right)   Consults: None  HPI: Admitted for operative treatment of right shoulder OA with total shoulder. Laboratory Data: Admission on 02/08/2020, Discharged on 02/09/2020  Component Date Value Ref Range Status  . Transfuse no blood products 02/06/2020    Final                   Value:TRANSFUSE NO BLOOD PRODUCTS, VERIFIED BY MICHAEL FERGUSON RN Performed at [facility], [place] [address], [place], [place] [address]   . Glucose-Capillary 02/08/2020 91  70 - 99 mg/dL Final   Glucose reference range applies only to samples taken after fasting for at least 8 hours.  Hospital Outpatient Visit on 02/06/2020  Component Date Value Ref Range Status  . SARS Coronavirus 2 02/06/2020 NEGATIVE  NEGATIVE Final   Comment: (NOTE) SARS-CoV-2 target nucleic acids are NOT DETECTED.  The SARS-CoV-2 RNA is generally detectable in upper and lower respiratory specimens during the acute phase of infection. Negative results do not preclude SARS-CoV-2 infection, do not rule out co-infections with other pathogens, and should not be used as the sole basis for treatment or other patient  management decisions. Negative results must be combined with clinical observations, patient history, and epidemiological information. The expected result is Negative.  Fact Sheet for Patients: SugarRoll.be  Fact Sheet for Healthcare Providers: https://www.woods-mathews.com/  This test is not yet approved or cleared by the [place] FDA and  has been authorized for detection and/or diagnosis of SARS-CoV-2 by FDA under an Emergency Use Authorization (EUA). This EUA will remain  in effect (meaning this test can be used) for the duration of the COVID-19 declaration under Se                          ction 564(b)(1) of the Act, 21 U.S.C. section 360bbb-3(b)(1), unless the authorization is terminated or revoked sooner.  Performed at [facility], [place] [address] [place], [place] [address]   Hospital Outpatient Visit on 02/06/2020  Component Date Value Ref Range Status  . Glucose-Capillary 02/06/2020 101* 70 - 99 mg/dL Final   Glucose reference range applies only to samples taken after fasting for at least 8 hours.  Marland Kitchen MRSA, PCR 02/06/2020 NEGATIVE  NEGATIVE Final  . Staphylococcus aureus 02/06/2020 NEGATIVE  NEGATIVE Final   Comment: (NOTE) The Xpert SA Assay (FDA approved for NASAL specimens in patients 57 years of age and older), is one component of a comprehensive surveillance program. It is not intended to diagnose infection nor to guide or monitor treatment. Performed at [facility], [place] [address] [place], [place] [address]   .  Sodium 02/06/2020 140  135 - 145 mmol/L Final  . Potassium 02/06/2020 4.2  3.5 - 5.1 mmol/L Final  . Chloride 02/06/2020 107  98 - 111 mmol/L Final  . CO2 02/06/2020 26  22 - 32 mmol/L Final  . Glucose, Bld 02/06/2020 106* 70 - 99 mg/dL Final   Glucose reference range applies only to samples taken after fasting for at least 8 hours.  . BUN 02/06/2020 14  6 - 20 mg/dL Final  . Creatinine, Ser  02/06/2020 0.73  0.44 - 1.00 mg/dL Final  . Calcium 02/06/2020 9.1  8.9 - 10.3 mg/dL Final  . GFR calc non Af Amer 02/06/2020 >60  >60 mL/min Final  . GFR calc Af Amer 02/06/2020 >60  >60 mL/min Final  . Anion gap 02/06/2020 7  5 - 15 Final   Performed at [facility], [place] [address], [place], [place] [address]  . WBC 02/06/2020 4.8  4.0 - 10.5 K/uL Final  . RBC 02/06/2020 4.53  3.87 - 5.11 MIL/uL Final  . Hemoglobin 02/06/2020 14.1  12.0 - 15.0 g/dL Final  . HCT 02/06/2020 43.9  36 - 46 % Final  . MCV 02/06/2020 96.9  80.0 - 100.0 fL Final  . MCH 02/06/2020 31.1  26.0 - 34.0 pg Final  . MCHC 02/06/2020 32.1  30.0 - 36.0 g/dL Final  . RDW 02/06/2020 12.3  11.5 - 15.5 % Final  . Platelets 02/06/2020 203  150 - 400 K/uL Final  . nRBC 02/06/2020 0.0  0.0 - 0.2 % Final   Performed at [facility], [place] [address] [place], [place] [address]     X-Rays:DG Shoulder Right Port  Result Date: 02/08/2020 CLINICAL DATA:  Total right shoulder replacement. EXAM: PORTABLE RIGHT SHOULDER COMPARISON:  CT 12/19/2019. FINDINGS: Right shoulder replacement. Hardware intact. Anatomic alignment. No acute bony abnormality identified. IMPRESSION: Right shoulder replacement with anatomic alignment. Electronically Signed   By: Marcello Moores  Register   On: 02/08/2020 10:56    EKG:No orders found for this or any previous visit.   Hospital Course: Stacey [name] is a 59 y.o. who was admitted to Hospital. They were brought to the operating room on 02/08/2020 and underwent Procedure(s): TOTAL SHOULDER ARTHROPLASTY.  Patient tolerated the procedure well and was later transferred to the recovery room and then to the orthopaedic floor for postoperative care.  They were given PO and IV analgesics for pain control following their surgery.  They were given 24 hours of postoperative antibiotics of  Anti-infectives (From admission, onward)   Start     Dose/Rate Route Frequency Ordered Stop   02/08/20 1400  ceFAZolin  (ANCEF) IVPB 1 g/50 mL premix        1 g 100 mL/hr over 30 Minutes Intravenous Every 6 hours 02/08/20 1303 02/09/20 0145   02/08/20 0923  vancomycin (VANCOCIN) powder  Status:  Discontinued          As needed 02/08/20 0923 02/08/20 1002   02/08/20 0600  ceFAZolin (ANCEF) IVPB 2g/100 mL premix        2 g 200 mL/hr over 30 Minutes Intravenous On call to O.R. 02/08/20 2706 02/08/20 0757     and started on DVT prophylaxis in the form of None.   OT were ordered for total joint protocol.  Discharge planning consulted to help with postop disposition and equipment needs.  Patient was seen in rounds and was ready to go home.   Diet: Regular diet Activity:NWB Follow-up:in 2 weeks  Disposition - Home Discharged Condition: good   Discharge Instructions    Call MD / Call 911   Complete by: As directed    If you experience chest pain or shortness of breath, CALL 911 and be transported to the hospital emergency room.  If you develope a fever above 101 F, pus (white drainage) or increased drainage or redness at the wound, or calf pain, call your surgeon's office.   Constipation Prevention   Complete by: As directed    Drink plenty of fluids.  Prune juice may be helpful.  You may use a stool softener, such as Colace (over the counter) 100 mg twice a day.  Use MiraLax (over the counter) for constipation as needed.   Diet - low sodium heart healthy   Complete by: As directed    Increase activity slowly as tolerated   Complete by: As directed      Allergies as of 02/09/2020      Reactions   Propofol Nausea Only      Medication List    TAKE these medications   calcium carbonate 500 MG chewable tablet Commonly known as: TUMS - dosed in mg elemental calcium Chew 1 tablet by mouth daily as needed for indigestion or heartburn.   famotidine 20 MG tablet Commonly known as: PEPCID Take 20 mg by mouth daily.   gabapentin 100 MG capsule Commonly known as: NEURONTIN Take 100 mg by mouth at  bedtime.   methocarbamol 500 MG tablet Commonly known as: ROBAXIN Take 500 mg by mouth every 8 (eight) hours as needed for muscle spasms.   ondansetron 4 MG disintegrating tablet Commonly known as: Zofran ODT Take 1 tablet (4 mg total) by mouth every 8 (eight) hours as needed.   oxyCODONE 5 MG immediate release tablet Commonly known as: Roxicodone Take 1 tablet (5 mg total) by mouth every 4 (four) hours as needed for severe pain or breakthrough pain.   traMADol 50 MG tablet Commonly known as: ULTRAM Take 50 mg by mouth every 6 (six) hours as needed (pain).       Follow-up Information    Nicholes Stairs, MD In 2 weeks.   Specialty: Orthopedic Surgery Why: For suture removal Contact information: [address] [place] [address] [phone]               Signed: Geralynn Rile, MD Orthopaedic Surgery 02/09/2020, 4:21 PM

## 2020-02-09 NOTE — Progress Notes (Signed)
   Subjective:  Patient reports pain as mild to moderate.  She does feel as though the nerve block is wearing off somewhat.  Pain is managed well.  No fevers or night sweats to report.  No shortness of breath or chest pain.  Objective:   VITALS:   Vitals:   02/08/20 1304 02/08/20 1503 02/08/20 2119 02/09/20 0510  BP: (!) 134/71 116/69 (!) 119/61 112/65  Pulse: 62 65 68 66  Resp: 16 18 20 18   Temp: 97.8 F (36.6 C) 97.6 F (36.4 C) 97.8 F (36.6 C) 97.8 F (36.6 C)  TempSrc: Oral Oral Oral Oral  SpO2: 100% 97% 98% 98%  Weight:      Height:        Neurologically intact Neurovascular intact Sensation intact distally Intact pulses distally Incision: dressing C/D/I Compartment soft Mild numbness appreciated in the axillary nerve distribution consistent with peripheral nerve block.  Lab Results  Component Value Date   WBC 4.8 02/06/2020   HGB 14.1 02/06/2020   HCT 43.9 02/06/2020   MCV 96.9 02/06/2020   PLT 203 02/06/2020   BMET    Component Value Date/Time   NA 140 02/06/2020 0834   NA 139 09/17/2012 2231   K 4.2 02/06/2020 0834   K 4.0 09/17/2012 2231   CL 107 02/06/2020 0834   CL 106 09/17/2012 2231   CO2 26 02/06/2020 0834   CO2 29 09/17/2012 2231   GLUCOSE 106 (H) 02/06/2020 0834   GLUCOSE 131 (H) 09/17/2012 2231   BUN 14 02/06/2020 0834   BUN 13 09/17/2012 2231   CREATININE 0.73 02/06/2020 0834   CREATININE 0.94 09/17/2012 2231   CALCIUM 9.1 02/06/2020 0834   CALCIUM 8.9 09/17/2012 2231   GFRNONAA >60 02/06/2020 0834   GFRNONAA >60 09/17/2012 2231   GFRAA >60 02/06/2020 0834   GFRAA >60 09/17/2012 2231     Assessment/Plan: 1 Day Post-Op   Active Problems:   Hx of total shoulder replacement, right   Up with therapy -She has worked with occupational therapy.  She is doing well now on postoperative day #1.  -Plan for discharge home. -Sling at all times and nonweightbearing to right upper extremity. Follow-up with me in 2 weeks.   Nicholes Stairs 02/09/2020, 1:13 PM   Geralynn Rile, MD [phone]

## 2020-02-09 NOTE — Plan of Care (Signed)

## 2020-02-09 NOTE — Evaluation (Signed)
Occupational Therapy Evaluation Patient Details Name: Stacey [name] MRN: [id number] DOB: 27-Dec-1960 Today's Date: 02/09/2020    History of Present Illness Stacey [name] is a 59 y.o. female s/p R total shoulder arthroplasty. PMH includes, L total shoulder arthroplasty  01/2018, GERD, chronic lower back pain, pre-diabetes.    Clinical Impression   PTA, pt was living at home with her husband, she was working, fully independent with ADL/IADL and independent with functional mobility. Pt continues to report block active in RUE but wearing off. Pt currently currently requires minA for UB and LB dressing and minguard for functional mobility without AD. Pt educated on compensatory strategies, weight bearing precautions, importance of elevating UE, proper positioning and proper sling wear schedule. Pt verbalized understanding. Feel pt would benefit from continued acute therapy services to maximize independence with self-care to allow safe d/c home with husband and pt's mother. Recommend following surgeons recommendation for follow-up therapies. Will continue to follow acutely.      Follow Up Recommendations  Follow surgeon's recommendation for DC plan and follow-up therapies    Equipment Recommendations  None recommended by OT    Recommendations for Other Services       Precautions / Restrictions Precautions Precautions: Shoulder Type of Shoulder Precautions: AROM elbow, wrist and hand to tolerance;no AROM shoulder; no PROM shoulder Shoulder Interventions: Shoulder sling/immobilizer;At all times;Off for dressing/bathing/exercises Precaution Booklet Issued: Yes (comment) Precaution Comments: provided handout and verbally reviewed and demonstrated topics Required Braces or Orthoses: Sling Restrictions Weight Bearing Restrictions: Yes RUE Weight Bearing: Non weight bearing      Mobility Bed Mobility Overal bed mobility: Needs Assistance Bed Mobility: Supine to Sit     Supine to  sit: Min guard     General bed mobility comments: minguard for technique and safety, pt progressed to EOB towards Left side of bed  Transfers Overall transfer level: Needs assistance Equipment used: None Transfers: Sit to/from Stand Sit to Stand: Min guard         General transfer comment: minguard for safety and initial stability with LB dressing    Balance Overall balance assessment: Mild deficits observed, not formally tested                                         ADL either performed or assessed with clinical judgement   ADL Overall ADL's : Needs assistance/impaired Eating/Feeding: Set up;Sitting   Grooming: Standing;Min guard   Upper Body Bathing: Set up;Sitting   Lower Body Bathing: Min guard;Sit to/from stand   Upper Body Dressing : Minimal assistance;Sitting Upper Body Dressing Details (indicate cue type and reason): minA to don/doff sling and UB clothing; Lower Body Dressing: Minimal assistance;Sit to/from stand Lower Body Dressing Details (indicate cue type and reason): pt required assistance to pull briefs and pants over bottom;able to button pants independently Toilet Transfer: Min guard;Ambulation Toilet Transfer Details (indicate cue type and reason): minguard for safety, pt reports slight dizziness Toileting- Clothing Manipulation and Hygiene: Min guard;Sit to/from stand       Functional mobility during ADLs: Min guard General ADL Comments: pt limited by decreased functional use of RUE, pain in RUE, dizziness and slight instability     Vision Baseline Vision/History: Wears glasses Wears Glasses: Reading only Patient Visual Report: No change from baseline       Perception     Praxis      Pertinent Vitals/Pain Pain  Assessment: 0-10 Pain Score: 6  Pain Location: R shoulder Pain Descriptors / Indicators: Grimacing;Guarding Pain Intervention(s): Limited activity within patient's tolerance;Monitored during session;Patient  requesting pain meds-RN notified     Hand Dominance Right   Extremity/Trunk Assessment Upper Extremity Assessment Upper Extremity Assessment: RUE deficits/detail RUE Deficits / Details: pt reports numbness and tingling in RUE, reports more sensation in hand;pt with decreased functional use due to residual block RUE:  (limited assessment due to block) RUE Sensation: decreased light touch RUE Coordination: decreased fine motor;decreased gross motor   Lower Extremity Assessment Lower Extremity Assessment: Overall WFL for tasks assessed   Cervical / Trunk Assessment Cervical / Trunk Assessment: Normal   Communication Communication Communication: No difficulties   Cognition Arousal/Alertness: Awake/alert Behavior During Therapy: WFL for tasks assessed/performed Overall Cognitive Status: Within Functional Limits for tasks assessed                                 General Comments: good awareness of safety and good adherence to precautions   General Comments  vss;pt's husband present during session    Exercises Exercises: Shoulder Shoulder Exercises Wrist Flexion: AROM;Right;5 reps;Seated Wrist Extension: AROM;Right;5 reps;Seated Digit Composite Flexion: AROM;Right;5 reps;Seated Composite Extension: AROM;Right;5 reps;Seated   Shoulder Instructions Shoulder Instructions Donning/doffing shirt without moving shoulder: Minimal assistance Donning/doffing sling/immobilizer: Minimal assistance Correct positioning of sling/immobilizer: Minimal assistance ROM for elbow, wrist and digits of operated UE: Moderate assistance Sling wearing schedule (on at all times/off for ADL's): Minimal assistance Proper positioning of operated UE when showering: Minimal assistance Positioning of UE while sleeping: Minimal assistance    Home Living Family/patient expects to be discharged to:: Private residence Living Arrangements: Spouse/significant other Available Help at Discharge:  Family (available 24hrs for initial 5 days d/c) Type of Home: Mobile home Home Access: Stairs to enter Entrance Stairs-Number of Steps: 5 Entrance Stairs-Rails: Can reach both Home Layout: One level     Bathroom Shower/Tub: Teacher, early years/pre: Standard (have both)         Additional Comments: pt and husband work, husband will be home with pt for initial 5 days following d/c;after that husband will drop pt off at her mother's house during the day      Prior Functioning/Environment Level of Independence: Independent        Comments: pt was working, driving, and independent with all ADL        OT Problem List: Decreased strength;Decreased range of motion;Decreased activity tolerance;Decreased knowledge of precautions;Pain;Impaired UE functional use      OT Treatment/Interventions:      OT Goals(Current goals can be found in the care plan section) Acute Rehab OT Goals Patient Stated Goal: to get back to work OT Goal Formulation: With patient Time For Goal Achievement: 02/23/20 Potential to Achieve Goals: Good ADL Goals Pt Will Perform Grooming: with modified independence;standing Pt Will Perform Upper Body Dressing: with modified independence;sitting;standing Pt Will Perform Lower Body Dressing: with modified independence;sit to/from stand  OT Frequency:     Barriers to D/C:            Co-evaluation              AM-PAC OT "6 Clicks" Daily Activity     Outcome Measure Help from another person eating meals?: A Little Help from another person taking care of personal grooming?: A Little Help from another person toileting, which includes using toliet, bedpan, or urinal?: A Little  Help from another person bathing (including washing, rinsing, drying)?: A Little Help from another person to put on and taking off regular upper body clothing?: A Little Help from another person to put on and taking off regular lower body clothing?: A Little 6 Click  Score: 18   End of Session Equipment Utilized During Treatment:  (sling) Nurse Communication: Mobility status;Patient requests pain meds  Activity Tolerance: Patient tolerated treatment well Patient left: in chair;with call bell/phone within reach;with family/visitor present  OT Visit Diagnosis: Other abnormalities of gait and mobility (R26.89);Pain Pain - Right/Left: Right Pain - part of body: Shoulder                Time: 0258-5277 OT Time Calculation (min): 34 min Charges:  OT General Charges $OT Visit: 1 Visit OT Evaluation $OT Eval Moderate Complexity: 1 Mod OT Treatments $Self Care/Home Management : 8-22 mins  Helene Kelp OTR/L Acute Rehabilitation Services Office: [phone]   Wyn Forster 02/09/2020, 10:36 AM

## 2020-02-09 NOTE — Discharge Instructions (Signed)
-  Maintain postoperative bandage until your follow-up appointment.  This may be worn while in the shower.  Do not submerge underwater.  -No lifting with the right arm.  -Maintain sling at all times unless showering and participating in activities of daily living.  -For mild to moderate pain use Tylenol and Advil around-the-clock.  You should also apply ice to the right shoulder for 20 to 30 minutes/h that you are awake.  For breakthrough pain use oxycodone as necessary.  -Return to see Dr. Stann Mainland in 2 weeks for routine postoperative check.

## 2020-02-13 DIAGNOSIS — Z4789 Encounter for other orthopedic aftercare: Secondary | ICD-10-CM | POA: Diagnosis not present

## 2020-02-20 DIAGNOSIS — Z4789 Encounter for other orthopedic aftercare: Secondary | ICD-10-CM | POA: Diagnosis not present

## 2020-02-26 ENCOUNTER — Encounter (HOSPITAL_COMMUNITY): Payer: Self-pay | Admitting: Orthopedic Surgery

## 2020-02-28 DIAGNOSIS — Z4789 Encounter for other orthopedic aftercare: Secondary | ICD-10-CM | POA: Diagnosis not present

## 2020-03-05 DIAGNOSIS — Z4789 Encounter for other orthopedic aftercare: Secondary | ICD-10-CM | POA: Diagnosis not present

## 2020-03-12 DIAGNOSIS — Z4789 Encounter for other orthopedic aftercare: Secondary | ICD-10-CM | POA: Diagnosis not present

## 2020-03-19 DIAGNOSIS — Z4789 Encounter for other orthopedic aftercare: Secondary | ICD-10-CM | POA: Diagnosis not present

## 2020-03-21 DIAGNOSIS — Z4789 Encounter for other orthopedic aftercare: Secondary | ICD-10-CM | POA: Diagnosis not present

## 2020-03-26 DIAGNOSIS — Z4789 Encounter for other orthopedic aftercare: Secondary | ICD-10-CM | POA: Diagnosis not present

## 2020-03-28 DIAGNOSIS — Z4789 Encounter for other orthopedic aftercare: Secondary | ICD-10-CM | POA: Diagnosis not present

## 2020-04-02 DIAGNOSIS — Z4789 Encounter for other orthopedic aftercare: Secondary | ICD-10-CM | POA: Diagnosis not present

## 2020-04-03 DIAGNOSIS — M542 Cervicalgia: Secondary | ICD-10-CM | POA: Diagnosis not present

## 2020-04-04 DIAGNOSIS — Z4789 Encounter for other orthopedic aftercare: Secondary | ICD-10-CM | POA: Diagnosis not present

## 2020-04-05 ENCOUNTER — Other Ambulatory Visit: Payer: Self-pay

## 2020-04-05 ENCOUNTER — Emergency Department
Admission: EM | Admit: 2020-04-05 | Discharge: 2020-04-05 | Disposition: A | Discharge status: Home / Self Care | Payer: BC Managed Care – PPO | Attending: Emergency Medicine | Admitting: Emergency Medicine

## 2020-04-05 DIAGNOSIS — Z96612 Presence of left artificial shoulder joint: Secondary | ICD-10-CM | POA: Diagnosis not present

## 2020-04-05 DIAGNOSIS — I10 Essential (primary) hypertension: Secondary | ICD-10-CM | POA: Insufficient documentation

## 2020-04-05 DIAGNOSIS — Z96611 Presence of right artificial shoulder joint: Secondary | ICD-10-CM | POA: Insufficient documentation

## 2020-04-05 DIAGNOSIS — R42 Dizziness and giddiness: Secondary | ICD-10-CM | POA: Diagnosis not present

## 2020-04-05 LAB — CBC
HCT: 43.3 % (ref 36.0–46.0)
Hemoglobin: 14.3 g/dL (ref 12.0–15.0)
MCH: 30.2 pg (ref 26.0–34.0)
MCHC: 33 g/dL (ref 30.0–36.0)
MCV: 91.5 fL (ref 80.0–100.0)
Platelets: 196 10*3/uL (ref 150–400)
RBC: 4.73 MIL/uL (ref 3.87–5.11)
RDW: 12.7 % (ref 11.5–15.5)
WBC: 5.3 10*3/uL (ref 4.0–10.5)
nRBC: 0 % (ref 0.0–0.2)

## 2020-04-05 LAB — BASIC METABOLIC PANEL
Anion gap: 9 (ref 5–15)
BUN: 13 mg/dL (ref 6–20)
CO2: 25 mmol/L (ref 22–32)
Calcium: 9.1 mg/dL (ref 8.9–10.3)
Chloride: 106 mmol/L (ref 98–111)
Creatinine, Ser: 0.68 mg/dL (ref 0.44–1.00)
GFR calc Af Amer: >60 mL/min (ref >60)
GFR calc non Af Amer: >60 mL/min (ref >60)
Glucose, Bld: 113 mg/dL — ABNORMAL HIGH (ref 70–99)
Potassium: 4.4 mmol/L (ref 3.5–5.1)
Sodium: 140 mmol/L (ref 135–145)

## 2020-04-05 LAB — TROPONIN I (HIGH SENSITIVITY): Troponin I (High Sensitivity): 3 ng/L (ref <18)

## 2020-04-05 MED ORDER — ACETAMINOPHEN 500 MG PO TABS
1000.0000 mg | ORAL_TABLET | Freq: Once | ORAL | Status: AC
  Administered 2020-04-05: 1000 mg via ORAL
  Filled 2020-04-05: qty 2

## 2020-04-05 MED ORDER — MECLIZINE HCL 25 MG PO TABS: 25.0000 mg | ORAL_TABLET | Freq: Three times a day (TID) | ORAL | 0 refills | Status: DC | PRN

## 2020-04-05 MED ORDER — MECLIZINE HCL 25 MG PO TABS
25.0000 mg | ORAL_TABLET | Freq: Once | ORAL | Status: AC
  Administered 2020-04-05: 25 mg via ORAL
  Filled 2020-04-05: qty 1

## 2020-04-05 NOTE — ED Triage Notes (Signed)
First RN note: pt presents to ED via wheelchair from [facility], per [facility] RN pt with HTN 195/85 at [facility] with no hx of HTN, per [facility] RN pt with dizziness and vertigo symptoms that started yesterday. [facility] RN reports pt awoke this morning with tingling to L side of face and down L arm, no slurred speech, no facial droop, reports continued vertigo symptoms with turning of her head.

## 2020-04-05 NOTE — Discharge Instructions (Signed)
You were seen in the ED because of your dizziness and high blood pressure.  Therefore, there is no evidence of strain/damage to your heart or signs of stroke from your high blood pressure. As we discussed, please use your blood pressure cuff at home to monitor your blood pressures when you are feeling normal, write those numbers down to keep track of it.  You are being discharged a prescription for meclizine to be used as needed for your vertigo.  Please use up to 4-5 times per day, as needed, for your vertigo.  If you develop any worsening vertigo that is not responding/improving with meclizine, any strokelike symptoms or fevers with your symptoms, please return to the ED.

## 2020-04-05 NOTE — ED Provider Notes (Signed)
[facility] Emergency Department Provider Note ____________________________________________   First MD Initiated Contact with Patient 04/05/20 2123     (approximate)  I have reviewed the triage vital signs and the nursing notes.  HISTORY  Chief Complaint Hypertension and Dizziness   HPI Stacey [name] is a 59 y.o. femalewho presents to the ED for evaluation of hypertension and intermittent dizziness.   Chart review indicates no relevant medical history.  Patient takes no antihypertensive medications. Patient self-reports a history of vertigo with meclizine at home.  Patient reports 2-3 days of intermittent and positional vertiginous dizziness.  She reports similar vertigo sensation as he has had in the past.  These episodes are precipitated by change in position of her head, particularly laying down, causing vertigo that lasts a matter of seconds before self resolving.  She denies any persistent vertigo.  Denies head trauma, syncope or fever.  Patient further reporting concerns for hypertension.  She reports not typically measuring her blood pressure at home, but checking with her wrist a BP cuff today due to the symptoms of dizziness.  She reports readings at home that are elevated with systolics in the 841Y.  Due to this elevated blood pressure in the setting of her dizziness, patient presents to the ED for evaluation.  Patient further reports associated tingling sensation to her left hand in the setting of noticing her high blood pressure.  Denies any chest pain, left arm pain, jaw pain, shortness of breath, palpitations or syncope.  Here in the ED, patient has no complaints.  She denies any dizziness at this time, even when she moves her head.  She reports a mild headache that she attributes to lack of p.o. intake throughout the day today because she has been sitting the waiting room for about 9 hours prior to my evaluation.  Past Medical History:    Diagnosis Date  . Arthritis    "left shoulder, neck, lower back"  (01/11/2018)  . Chronic lower back pain   . Family history of adverse reaction to anesthesia    Mother has nausea  . GERD (gastroesophageal reflux disease)   . PONV (postoperative nausea and vomiting)   . Pre-diabetes     Patient Active Problem List   Diagnosis Date Noted  . Hx of total shoulder replacement, right 02/08/2020  . Osteoarthritis of left shoulder 01/11/2018  . S/P shoulder replacement, left 01/11/2018    Past Surgical History:  Procedure Laterality Date  . APPENDECTOMY    . JOINT REPLACEMENT    . TOTAL SHOULDER ARTHROPLASTY Left 01/11/2018  . TOTAL SHOULDER ARTHROPLASTY Left 01/11/2018   Procedure: LEFT TOTAL SHOULDER ARTHROPLASTY;  Surgeon: Nicholes Stairs, MD;  Location: [place];  Service: Orthopedics;  Laterality: Left;  2.5 hrs  . TOTAL SHOULDER ARTHROPLASTY Right 02/08/2020   Procedure: TOTAL SHOULDER ARTHROPLASTY;  Surgeon: Nicholes Stairs, MD;  Location: [place];  Service: Orthopedics;  Laterality: Right;  2.5 hrs RNFA    Prior to Admission medications   Medication Sig Start Date End Date Taking? Authorizing Provider  calcium carbonate (TUMS - DOSED IN MG ELEMENTAL CALCIUM) 500 MG chewable tablet Chew 1 tablet by mouth daily as needed for indigestion or heartburn.    [provider]  famotidine (PEPCID) 20 MG tablet Take 20 mg by mouth daily.    [provider]  gabapentin (NEURONTIN) 100 MG capsule Take 100 mg by mouth at bedtime.    [provider]  meclizine (ANTIVERT) 25 MG tablet  Take 1 tablet (25 mg total) by mouth 3 (three) times daily as needed for dizziness. 04/05/20   Vladimir Crofts, MD  methocarbamol (ROBAXIN) 500 MG tablet Take 500 mg by mouth every 8 (eight) hours as needed for muscle spasms.  10/15/17   [provider]  ondansetron (ZOFRAN ODT) 4 MG disintegrating tablet Take 1 tablet (4 mg total) by mouth every 8 (eight) hours as needed. 02/09/20    Nicholes Stairs, MD  oxyCODONE (ROXICODONE) 5 MG immediate release tablet Take 1 tablet (5 mg total) by mouth every 4 (four) hours as needed for severe pain or breakthrough pain. 02/09/20 02/08/21  Nicholes Stairs, MD  traMADol (ULTRAM) 50 MG tablet Take 50 mg by mouth every 6 (six) hours as needed (pain).  11/25/17   [provider]    Allergies Propofol  Family History  Problem Relation Age of Onset  . Hypertension Mother   . Heart disease Father   . Heart attack Father   . CAD Father     Social History Social History   Tobacco Use  . Smoking status: Never Smoker  . Smokeless tobacco: Never Used  Vaping Use  . Vaping Use: Never used  Substance Use Topics  . Alcohol use: Yes    Comment: 01/11/2018 "might have 1 drink/month"  . Drug use: Never    Review of Systems  Constitutional: No fever/chills Eyes: No visual changes. ENT: No sore throat. Cardiovascular: Denies chest pain.  Positive for hypertension. Respiratory: Denies shortness of breath. Gastrointestinal: No abdominal pain.  No nausea, no vomiting.  No diarrhea.  No constipation. Genitourinary: Negative for dysuria. Musculoskeletal: Negative for back pain. Skin: Negative for rash. Neurological: Negative for  focal weakness or numbness.  Positive for headache.  Positive for left hand tingling.  ____________________________________________   PHYSICAL EXAM:  VITAL SIGNS: Vitals:   04/05/20 1915 04/05/20 2228  BP: (!) 146/94 (!) 146/83  Pulse: 94 70  Resp: 16 16  Temp:    SpO2: 100% 100%      Constitutional: Alert and oriented. Well appearing and in no acute distress. Eyes: Conjunctivae are normal. PERRL. EOMI. Head: Atraumatic. Nose: No congestion/rhinnorhea. Mouth/Throat: Mucous membranes are moist.  Oropharynx non-erythematous. Neck: No stridor. No cervical spine tenderness to palpation. Cardiovascular: Normal rate, regular rhythm. Grossly normal heart sounds.  Good peripheral  circulation. Respiratory: Normal respiratory effort.  No retractions. Lungs CTAB. Gastrointestinal: Soft , nondistended, nontender to palpation. No abdominal bruits. No CVA tenderness. Musculoskeletal: No lower extremity tenderness nor edema.  No joint effusions. No signs of acute trauma. Neurologic:  Normal speech and language. No gross focal neurologic deficits are appreciated. No gait instability noted. Cranial nerves II through XII intact 5/5 strength and sensation in all 4 extremities Skin:  Skin is warm, dry and intact. No rash noted. Psychiatric: Mood and affect are normal. Speech and behavior are normal.  ____________________________________________   LABS (all labs ordered are listed, but only abnormal results are displayed)  Labs Reviewed  BASIC METABOLIC PANEL - Abnormal; Notable for the following components:      Result Value   Glucose, Bld 113 (*)    All other components within normal limits  CBC  URINALYSIS, COMPLETE (UACMP) WITH MICROSCOPIC  TROPONIN I (HIGH SENSITIVITY)   ____________________________________________  12 Lead EKG  Sinus rhythm, rate of 74 bpm.  Normal axis and intervals.  No evidence of acute ischemia. ____________________________________________   PROCEDURES and INTERVENTIONS  Procedure(s) performed (including Critical Care):  Procedures  Medications  meclizine (ANTIVERT) tablet 25 mg (25 mg Oral Given 04/05/20 2201)  acetaminophen (TYLENOL) tablet 1,000 mg (1,000 mg Oral Given 04/05/20 2201)    ____________________________________________   MDM / ED COURSE  59 year old woman with history of vertigo, presents with intermittent vertigo that is likely peripheral in etiology and benign, as well as hypertension without evidence of endorgan damage amenable to outpatient management.  Patient with minimal persistent hypertension with systolics in the 408X, otherwise normal vital signs on room air.  Exam is reassuring without evidence of acute  pathology.  She is in no distress, has no neurovascular deficits or signs of trauma.  Blood work is unremarkable.  EKG is nonischemic and troponin is negative.  She has no signs or symptoms of a central etiology of her vertigo.  She has positional vertigo that is fleeting and typical for her.  No indications for acute advanced imaging of her head.  Patient tolerates p.o. intake and ambulates without difficulty.  I see no barriers to outpatient management.  We discussed return precautions for the ED and patient is medically stable discharge home.  Clinical Course as of Apr 06 2319  Fri Apr 05, 2020  2313 Reassessed. Patient reports feeling well.  Resolution of headache after Tylenol.  Toleration of a full dinner tray without nausea or vomiting or abdominal pain.  Ambulatory without difficulty.  Boyfriend at the bedside.  Patient eager to go home.  She does not have any more meclizine at home and requested prescription.  We discussed return precautions for the ED, she expresses understanding agreement.   [DS]    Clinical Course User Index [DS] Vladimir Crofts, MD     ____________________________________________   FINAL CLINICAL IMPRESSION(S) / ED DIAGNOSES  Final diagnoses:  Vertigo  Essential hypertension     ED Discharge Orders         Ordered    meclizine (ANTIVERT) 25 MG tablet  3 times daily PRN        04/05/20 2315           Gabriel Conry   Note:  This document was prepared using Dragon voice recognition software and may include unintentional dictation errors.   Vladimir Crofts, MD 04/05/20 [id number]

## 2020-04-05 NOTE — ED Triage Notes (Addendum)
Reports dizziness and tingling in left hand intermittently since Wednesday, has been checking BP today and found to by high. Pt alert and oriented X4, cooperative, RR even and unlabored, color WNL. Pt in NAD.

## 2020-04-09 DIAGNOSIS — Z4789 Encounter for other orthopedic aftercare: Secondary | ICD-10-CM | POA: Diagnosis not present

## 2020-04-11 DIAGNOSIS — Z4789 Encounter for other orthopedic aftercare: Secondary | ICD-10-CM | POA: Diagnosis not present

## 2020-04-16 DIAGNOSIS — Z4789 Encounter for other orthopedic aftercare: Secondary | ICD-10-CM | POA: Diagnosis not present

## 2020-04-18 DIAGNOSIS — Z4789 Encounter for other orthopedic aftercare: Secondary | ICD-10-CM | POA: Diagnosis not present

## 2020-04-23 DIAGNOSIS — Z4789 Encounter for other orthopedic aftercare: Secondary | ICD-10-CM | POA: Diagnosis not present

## 2020-04-25 DIAGNOSIS — Z4789 Encounter for other orthopedic aftercare: Secondary | ICD-10-CM | POA: Diagnosis not present

## 2020-05-02 DIAGNOSIS — Z4789 Encounter for other orthopedic aftercare: Secondary | ICD-10-CM | POA: Diagnosis not present

## 2020-05-06 DIAGNOSIS — Z4789 Encounter for other orthopedic aftercare: Secondary | ICD-10-CM | POA: Diagnosis not present

## 2020-05-07 DIAGNOSIS — Z4789 Encounter for other orthopedic aftercare: Secondary | ICD-10-CM | POA: Diagnosis not present

## 2020-05-09 DIAGNOSIS — Z4789 Encounter for other orthopedic aftercare: Secondary | ICD-10-CM | POA: Diagnosis not present

## 2020-05-14 DIAGNOSIS — Z4789 Encounter for other orthopedic aftercare: Secondary | ICD-10-CM | POA: Diagnosis not present

## 2020-05-16 DIAGNOSIS — Z4789 Encounter for other orthopedic aftercare: Secondary | ICD-10-CM | POA: Diagnosis not present

## 2020-05-21 DIAGNOSIS — Z4789 Encounter for other orthopedic aftercare: Secondary | ICD-10-CM | POA: Diagnosis not present

## 2020-05-22 DIAGNOSIS — J209 Acute bronchitis, unspecified: Secondary | ICD-10-CM | POA: Diagnosis not present

## 2020-05-22 DIAGNOSIS — Z03818 Encounter for observation for suspected exposure to other biological agents ruled out: Secondary | ICD-10-CM | POA: Diagnosis not present

## 2020-05-22 DIAGNOSIS — J019 Acute sinusitis, unspecified: Secondary | ICD-10-CM | POA: Diagnosis not present

## 2020-05-22 DIAGNOSIS — U071 COVID-19: Secondary | ICD-10-CM | POA: Diagnosis not present

## 2020-05-30 DIAGNOSIS — U071 COVID-19: Secondary | ICD-10-CM | POA: Diagnosis not present

## 2020-05-30 DIAGNOSIS — Z03818 Encounter for observation for suspected exposure to other biological agents ruled out: Secondary | ICD-10-CM | POA: Diagnosis not present

## 2020-06-04 DIAGNOSIS — Z4789 Encounter for other orthopedic aftercare: Secondary | ICD-10-CM | POA: Diagnosis not present

## 2020-06-11 DIAGNOSIS — Z4789 Encounter for other orthopedic aftercare: Secondary | ICD-10-CM | POA: Diagnosis not present

## 2020-06-13 DIAGNOSIS — M19011 Primary osteoarthritis, right shoulder: Secondary | ICD-10-CM | POA: Diagnosis not present

## 2020-06-13 DIAGNOSIS — Z4789 Encounter for other orthopedic aftercare: Secondary | ICD-10-CM | POA: Diagnosis not present

## 2021-02-27 ENCOUNTER — Encounter: Payer: Self-pay | Admitting: Physician Assistant

## 2021-03-20 DIAGNOSIS — N390 Urinary tract infection, site not specified: Secondary | ICD-10-CM

## 2021-03-20 HISTORY — DX: Urinary tract infection, site not specified: N39.0

## 2021-03-25 ENCOUNTER — Ambulatory Visit: Payer: BC Managed Care – PPO | Admitting: Dermatology

## 2021-04-04 ENCOUNTER — Ambulatory Visit (INDEPENDENT_AMBULATORY_CARE_PROVIDER_SITE_OTHER): Payer: BC Managed Care – PPO | Admitting: Physician Assistant

## 2021-04-04 ENCOUNTER — Encounter: Payer: Self-pay | Admitting: Physician Assistant

## 2021-04-04 VITALS — BP 140/80 | HR 56 | Ht 64.0 in | Wt 189.0 lb

## 2021-04-04 DIAGNOSIS — K219 Gastro-esophageal reflux disease without esophagitis: Secondary | ICD-10-CM

## 2021-04-04 DIAGNOSIS — R630 Anorexia: Secondary | ICD-10-CM | POA: Diagnosis not present

## 2021-04-04 DIAGNOSIS — Z1211 Encounter for screening for malignant neoplasm of colon: Secondary | ICD-10-CM

## 2021-04-04 MED ORDER — OMEPRAZOLE 20 MG PO CPDR
20.0000 mg | DELAYED_RELEASE_CAPSULE | Freq: Two times a day (BID) | ORAL | 5 refills | Status: DC
Start: 2021-04-04 — End: 2021-10-06

## 2021-04-04 MED ORDER — NA SULFATE-K SULFATE-MG SULF 17.5-3.13-1.6 GM/177ML PO SOLN: 1.0000 | Freq: Once | ORAL | 0 refills | Status: AC

## 2021-04-04 NOTE — Patient Instructions (Signed)
We have sent the following medications to your pharmacy for you to pick up at your convenience: Omeprazole 20 mg twice daily 30-60 minutes before breakfast and dinner.  You have been scheduled for an endoscopy and colonoscopy. Please follow the written instructions given to you at your visit today. Please pick up your prep supplies at the pharmacy within the next 1-3 days. If you use inhalers (even only as needed), please bring them with you on the day of your procedure.  If you are age 20 or older, your body mass index should be between 23-30. Your Body mass index is 32.44 kg/m. If this is out of the aforementioned range listed, please consider follow up with your Primary Care Provider.  If you are age 32 or younger, your body mass index should be between 19-25. Your Body mass index is 32.44 kg/m. If this is out of the aformentioned range listed, please consider follow up with your Primary Care Provider.   __________________________________________________________  The [place] [facility] providers would like to encourage you to use [facility] to communicate with providers for non-urgent requests or questions.  Due to long hold times on the telephone, sending your provider a message by [facility] may be a faster and more efficient way to get a response.  Please allow 48 business hours for a response.  Please remember that this is for non-urgent requests.

## 2021-04-04 NOTE — Progress Notes (Signed)
____________________________________________________________  Attending physician addendum:  Thank you for sending this case to me. I have reviewed the entire note and agree with the plan.  I expect that she will tolerate propofol well.  Wilfrid Lund, MD  ____________________________________________________________

## 2021-04-04 NOTE — Progress Notes (Signed)
Chief Complaint: GERD, discuss colonoscopy  HPI:    Stacey [name] is a 60 year old female with a past medical history as listed below including reflux and a family history of adverse reaction to anesthesia including nausea, who was referred to me by Philmore Pali, NP for a complaint of GERD and needing to discuss a colonoscopy.    Today, the patient tells me that she has had reflux for the past 6 to 8 years and typically uses Pepcid 20 mg 1-2 times daily as well as Tums.  Over the past year or so she has had an increase in symptoms where now anything she eats or drinks including water gives her an acid taste in her mouth and heartburn.  Denies ever being on a PPI.  Associated symptoms include a decrease in appetite and bloating.    Denies previous colonoscopy.    Denies fever, chills, weight loss, blood in her stool or symptoms that awaken her from sleep.  Past Medical History:  Diagnosis Date   Arthritis    "left shoulder, neck, lower back"  (01/11/2018)   Chronic lower back pain    Family history of adverse reaction to anesthesia    Mother has nausea   GERD (gastroesophageal reflux disease)    PONV (postoperative nausea and vomiting)    Pre-diabetes    UTI (urinary tract infection) 03/20/2021    Past Surgical History:  Procedure Laterality Date   APPENDECTOMY     JOINT REPLACEMENT     TOTAL SHOULDER ARTHROPLASTY Left 01/11/2018   TOTAL SHOULDER ARTHROPLASTY Left 01/11/2018   Procedure: LEFT TOTAL SHOULDER ARTHROPLASTY;  Surgeon: Nicholes Stairs, MD;  Location: [facility];  Service: Orthopedics;  Laterality: Left;  2.5 hrs   TOTAL SHOULDER ARTHROPLASTY Right 02/08/2020   Procedure: TOTAL SHOULDER ARTHROPLASTY;  Surgeon: Nicholes Stairs, MD;  Location: [facility];  Service: Orthopedics;  Laterality: Right;  2.5 hrs RNFA    Current Outpatient Medications  Medication Sig Dispense Refill   calcium carbonate (TUMS - DOSED IN MG ELEMENTAL CALCIUM) 500 MG chewable tablet Chew 1 tablet by  mouth daily as needed for indigestion or heartburn.     famotidine (PEPCID) 20 MG tablet Take 20 mg by mouth daily.     gabapentin (NEURONTIN) 100 MG capsule Take 100 mg by mouth at bedtime.     meclizine (ANTIVERT) 25 MG tablet Take 1 tablet (25 mg total) by mouth 3 (three) times daily as needed for dizziness. 30 tablet 0   methocarbamol (ROBAXIN) 500 MG tablet Take 500 mg by mouth every 8 (eight) hours as needed for muscle spasms.   0   traMADol (ULTRAM) 50 MG tablet Take 50 mg by mouth every 6 (six) hours as needed (pain).   0   No current facility-administered medications for this visit.    Allergies as of 04/04/2021 - Review Complete 04/04/2021  Allergen Reaction Noted   Propofol Nausea Only 12/28/2017    Family History  Problem Relation Age of Onset   Hypertension Mother    Heart disease Father    Heart attack Father    CAD Father     Social History   Socioeconomic History   Marital status: Married    Spouse name: Not on file   Number of children: Not on file   Years of education: Not on file   Highest education level: Not on file  Occupational History   Not on file  Tobacco Use   Smoking status: Never  Smokeless tobacco: Never  Vaping Use   Vaping Use: Never used  Substance and Sexual Activity   Alcohol use: Yes    Comment: 01/11/2018 "might have 1 drink/month"   Drug use: Never   Sexual activity: Not on file  Other Topics Concern   Not on file  Social History Narrative   Not on file   Social Determinants of Health   Financial Resource Strain: Not on file  Food Insecurity: Not on file  Transportation Needs: Not on file  Physical Activity: Not on file  Stress: Not on file  Social Connections: Not on file  Intimate Partner Violence: Not on file    Review of Systems:    Constitutional: No weight loss, fever or chills Skin: No rash  Cardiovascular: No chest pain  Respiratory: No SOB Gastrointestinal: See HPI and otherwise negative Genitourinary: No  dysuria  Neurological: No headache, dizziness or syncope Musculoskeletal: No new muscle or joint pain Hematologic: No bleeding Psychiatric: No history of depression or anxiety   Physical Exam:  Vital signs: BP 140/80   Pulse (!) 56   Ht 5\' 4"  (1.626 m)   Wt 189 lb (85.7 kg)   BMI 32.44 kg/m    Constitutional:   Pleasant Caucasian female appears to be in NAD, Well developed, Well nourished, alert and cooperative Head:  Normocephalic and atraumatic. Eyes:   PEERL, EOMI. No icterus. Conjunctiva pink. Ears:  Normal auditory acuity. Neck:  Supple Throat: Oral cavity and pharynx without inflammation, swelling or lesion.  Respiratory: Respirations even and unlabored. Lungs clear to auscultation bilaterally.   No wheezes, crackles, or rhonchi.  Cardiovascular: Normal S1, S2. No MRG. Regular rate and rhythm. No peripheral edema, cyanosis or pallor.  Gastrointestinal:  Soft, nondistended, nontender. No rebound or guarding. Normal bowel sounds. No appreciable masses or hepatomegaly. Rectal:  Not performed.  Msk:  Symmetrical without gross deformities. Without edema, no deformity or joint abnormality.  Neurologic:  Alert and  oriented x4;  grossly normal neurologically.  Skin:   Dry and intact without significant lesions or rashes. Psychiatric: Demonstrates good judgement and reason without abnormal affect or behaviors.  No recent labs  Assessment: 1.  GERD: Reflux symptoms over the past 6 to 8 years, worse over the past year now even to water, no help from Pepcid 40 mg daily and occasional Tums; consider likely gastritis+/- H. pylori+/- PUD 2.  Screening for colorectal cancer: Never had a screening colonoscopy and is 60 years old  Plan: 1.  Does describe a history of some nausea with anesthesia.  Discussed that she may be given Zofran at time of procedure. 2.  Scheduled patient for diagnostic EGD and a screening colonoscopy in the [facility] with Dr. Loletha Carrow.  Did provide the patient a detailed  list of risks for the procedures and she agrees to proceed. Patient is appropriate for endoscopic procedure(s) in the ambulatory ([facility]) setting.  3.  Prescribed Omeprazole 20 mg twice daily, 30-60 minutes before breakfast and dinner #60 with 5 refills. 4.  Reviewed antireflux diet and lifestyle modifications and provide the patient with a handout. 5.  Discussed that the patient can continue to use her Pepcid twice daily and Tums as needed if needed, but hopefully she will not need these medicines when she is on a PPI. 6.  Patient to follow in clinic per recommendations from Dr. Loletha Carrow after time of procedure.  Ellouise Newer, PA-C [facility] 04/04/2021, 10:44 AM  Cc: Philmore Pali, NP

## 2021-04-11 ENCOUNTER — Encounter: Payer: Self-pay | Admitting: Gastroenterology

## 2021-04-17 ENCOUNTER — Ambulatory Visit (AMBULATORY_SURGERY_CENTER): Payer: BC Managed Care – PPO | Admitting: Gastroenterology

## 2021-04-17 ENCOUNTER — Other Ambulatory Visit: Payer: Self-pay

## 2021-04-17 ENCOUNTER — Encounter: Payer: Self-pay | Admitting: Gastroenterology

## 2021-04-17 VITALS — BP 122/70 | HR 53 | Temp 96.9°F | Resp 12 | Ht 64.0 in | Wt 189.0 lb

## 2021-04-17 DIAGNOSIS — Z1211 Encounter for screening for malignant neoplasm of colon: Secondary | ICD-10-CM

## 2021-04-17 DIAGNOSIS — K219 Gastro-esophageal reflux disease without esophagitis: Secondary | ICD-10-CM | POA: Diagnosis not present

## 2021-04-17 DIAGNOSIS — D122 Benign neoplasm of ascending colon: Secondary | ICD-10-CM

## 2021-04-17 DIAGNOSIS — K635 Polyp of colon: Secondary | ICD-10-CM

## 2021-04-17 DIAGNOSIS — D120 Benign neoplasm of cecum: Secondary | ICD-10-CM

## 2021-04-17 MED ORDER — SODIUM CHLORIDE 0.9 % IV SOLN: 500.0000 mL | Freq: Once | INTRAVENOUS | Status: DC

## 2021-04-17 NOTE — Op Note (Signed)
[facility] Patient Name: Nessie Nong Procedure Date: 04/17/2021 2:44 PM MRN: [id number] Endoscopist: Mallie Mussel L. Loletha Carrow , MD Age: 60 Referring MD:  Date of Birth: 1960/11/20 Gender: Female Account #: [id number] Procedure:                Upper GI endoscopy Indications:              Esophageal reflux symptoms that persist despite                            appropriate therapy Medicines:                Monitored Anesthesia Care Procedure:                Pre-Anesthesia Assessment:                           - Prior to the procedure, a History and Physical                            was performed, and patient medications and                            allergies were reviewed. The patient's tolerance of                            previous anesthesia was also reviewed. The risks                            and benefits of the procedure and the sedation                            options and risks were discussed with the patient.                            All questions were answered, and informed consent                            was obtained. Prior Anticoagulants: The patient has                            taken no previous anticoagulant or antiplatelet                            agents. ASA Grade Assessment: II - A patient with                            mild systemic disease. After reviewing the risks                            and benefits, the patient was deemed in                            satisfactory condition to undergo the procedure.  After obtaining informed consent, the endoscope was                            passed under direct vision. Throughout the                            procedure, the patient's blood pressure, pulse, and                            oxygen saturations were monitored continuously. The                            GIF [id number] #[id number] was introduced through the                            mouth, and advanced to the second part of  duodenum.                            The upper GI endoscopy was accomplished without                            difficulty. The patient tolerated the procedure                            well. Scope In: Scope Out: Findings:                 The larynx was normal.                           The esophagus was normal.                           There is no endoscopic evidence of Barrett's                            esophagus, esophagitis, hiatal hernia or stricture                            in the entire esophagus.                           The stomach was normal.                           The cardia and gastric fundus were normal on                            retroflexion. (Hill grade 1)                           The examined duodenum was normal. Complications:            No immediate complications. Estimated Blood Loss:     Estimated blood loss: none. Impression:               - Normal larynx.                           -  Normal esophagus.                           - Normal stomach.                           - Normal examined duodenum.                           - No specimens collected. Recommendation:           - Patient has a contact number available for                            emergencies. The signs and symptoms of potential                            delayed complications were discussed with the                            patient. Return to normal activities tomorrow.                            Written discharge instructions were provided to the                            patient.                           - Resume previous diet.                           - Continue present medications.                           - Follow an antireflux regimen indefinitely.                           - Return to my office at appointment to be                            scheduled to follow up reflux.                           - See the other procedure note for documentation of                             additional recommendations. Kriti Katayama L. Loletha Carrow, MD 04/17/2021 3:33:49 PM This report has been signed electronically.

## 2021-04-17 NOTE — Patient Instructions (Signed)
Handout given:  polyps, antireflux  Resume previous diet Continue current medications Follow antireflux regimen Await pathology results  YOU HAD AN ENDOSCOPIC PROCEDURE TODAY AT [facility]:   Refer to the procedure report that was given to you for any specific questions about what was found during the examination.  If the procedure report does not answer your questions, please call your gastroenterologist to clarify.  If you requested that your care partner not be given the details of your procedure findings, then the procedure report has been included in a sealed envelope for you to review at your convenience later.  YOU SHOULD EXPECT: Some feelings of bloating in the abdomen. Passage of more gas than usual.  Walking can help get rid of the air that was put into your GI tract during the procedure and reduce the bloating. If you had a lower endoscopy (such as a colonoscopy or flexible sigmoidoscopy) you may notice spotting of blood in your stool or on the toilet paper. If you underwent a bowel prep for your procedure, you may not have a normal bowel movement for a few days.  Please Note:  You might notice some irritation and congestion in your nose or some drainage.  This is from the oxygen used during your procedure.  There is no need for concern and it should clear up in a day or so.  SYMPTOMS TO REPORT IMMEDIATELY:  Following lower endoscopy (colonoscopy or flexible sigmoidoscopy):  Excessive amounts of blood in the stool  Significant tenderness or worsening of abdominal pains  Swelling of the abdomen that is new, acute  Fever of 100F or higher  Following upper endoscopy (EGD)  Vomiting of blood or coffee ground material  New chest pain or pain under the shoulder blades  Painful or persistently difficult swallowing  New shortness of breath  Fever of 100F or higher  Black, tarry-looking stools  For urgent or emergent issues, a gastroenterologist can be reached at any  hour by calling [phone]. Do not use MyChart messaging for urgent concerns.   DIET:  We do recommend a small meal at first, but then you may proceed to your regular diet.  Drink plenty of fluids but you should avoid alcoholic beverages for 24 hours.  ACTIVITY:  You should plan to take it easy for the rest of today and you should NOT DRIVE or use heavy machinery until tomorrow (because of the sedation medicines used during the test).    FOLLOW UP: Our staff will call the number listed on your records 48-72 hours following your procedure to check on you and address any questions or concerns that you may have regarding the information given to you following your procedure. If we do not reach you, we will leave a message.  We will attempt to reach you two times.  During this call, we will ask if you have developed any symptoms of COVID 19. If you develop any symptoms (ie: fever, flu-like symptoms, shortness of breath, cough etc.) before then, please call [phone].  If you test positive for Covid 19 in the 2 weeks post procedure, please call and report this information to [place].    If any biopsies were taken you will be contacted by phone or by letter within the next 1-3 weeks.  Please call us at [phone] if you have not heard about the biopsies in 3 weeks.   SIGNATURES/CONFIDENTIALITY: You and/or your care partner have signed paperwork which will be entered into your electronic  medical record.  These signatures attest to the fact that that the information above on your After Visit Summary has been reviewed and is understood.  Full responsibility of the confidentiality of this discharge information lies with you and/or your care-partner.  

## 2021-04-17 NOTE — Progress Notes (Signed)
Called to room to assist during endoscopic procedure.  Patient ID and intended procedure confirmed with present staff. Received instructions for my participation in the procedure from the performing physician.  

## 2021-04-17 NOTE — Op Note (Signed)
[facility] [facility] Patient Name: Stacey [name] Procedure Date: 04/17/2021 2:44 PM MRN: [id number] Endoscopist: Mallie Mussel L. Loletha Carrow , MD Age: 60 Referring MD:  Date of Birth: 1961/07/07 Gender: Female Account #: [id number] Procedure:                Colonoscopy Indications:              Screening for colorectal malignant neoplasm, This                            is the patient's first colonoscopy Medicines:                Monitored Anesthesia Care Procedure:                Pre-Anesthesia Assessment:                           - Prior to the procedure, a History and Physical                            was performed, and patient medications and                            allergies were reviewed. The patient's tolerance of                            previous anesthesia was also reviewed. The risks                            and benefits of the procedure and the sedation                            options and risks were discussed with the patient.                            All questions were answered, and informed consent                            was obtained. Prior Anticoagulants: The patient has                            taken no previous anticoagulant or antiplatelet                            agents. ASA Grade Assessment: II - A patient with                            mild systemic disease. After reviewing the risks                            and benefits, the patient was deemed in                            satisfactory condition to undergo the procedure.  After obtaining informed consent, the colonoscope                            was passed under direct vision. Throughout the                            procedure, the patient's blood pressure, pulse, and                            oxygen saturations were monitored continuously. The                            CF [id number] #[id number] was introduced through the anus                            and advanced to the the  terminal ileum, with                            identification of the appendiceal orifice and IC                            valve. The colonoscopy was performed without                            difficulty. The patient tolerated the procedure                            well. The quality of the bowel preparation was                            good. The terminal ileum, ileocecal valve,                            appendiceal orifice, and rectum were photographed. Scope In: 2:54:01 PM Scope Out: 3:15:48 PM Scope Withdrawal Time: 0 hours 18 minutes 38 seconds  Total Procedure Duration: 0 hours 21 minutes 47 seconds  Findings:                 The perianal and digital rectal examinations were                            normal.                           The terminal ileum appeared normal.                           A 8 mm polyp was found in the cecum. The polyp was                            flat. The polyp was removed with a cold snare.                            Resection and retrieval were complete.  A 6 mm polyp was found in the proximal ascending                            colon. The polyp was sessile. The polyp was removed                            with a cold snare. Resection and retrieval were                            complete.                           The exam was otherwise without abnormality on                            direct and retroflexion views. Complications:            No immediate complications. Estimated Blood Loss:     Estimated blood loss was minimal. Impression:               - The examined portion of the ileum was normal.                           - One 8 mm polyp in the cecum, removed with a cold                            snare. Resected and retrieved.                           - One 6 mm polyp in the proximal ascending colon,                            removed with a cold snare. Resected and retrieved.                           - The  examination was otherwise normal on direct                            and retroflexion views. Recommendation:           - Patient has a contact number available for                            emergencies. The signs and symptoms of potential                            delayed complications were discussed with the                            patient. Return to normal activities tomorrow.                            Written discharge instructions were provided to the  patient.                           - Resume previous diet.                           - Continue present medications.                           - Await pathology results.                           - Repeat colonoscopy is recommended for                            surveillance. The colonoscopy date will be                            determined after pathology results from today's                            exam become available for review.                           - See the other procedure note for documentation of                            additional recommendations. Diannia Hogenson L. Loletha Carrow, MD 04/17/2021 3:28:57 PM This report has been signed electronically.

## 2021-04-17 NOTE — Progress Notes (Signed)
Sedate, gd SR, tolerated procedure well, VSS, report to RN 

## 2021-04-17 NOTE — Progress Notes (Signed)
VS by DT    

## 2021-04-17 NOTE — Progress Notes (Signed)
No changes to clinical history since GI office visit on 04/04/21.  The patient is appropriate for an endoscopic procedure in the ambulatory setting.

## 2021-04-18 ENCOUNTER — Telehealth: Payer: Self-pay

## 2021-04-18 NOTE — Telephone Encounter (Signed)
Per 04/17/21 procedure report- Return to my office at appt to be schedule to follow up reflux.   Patient has been scheduled for a follow up with Dr. Loletha Carrow on Tuesday, 05/20/21 at 11 am. Appt information has been sent to patient via my chart and a letter has been mailed.

## 2021-04-21 ENCOUNTER — Telehealth: Payer: Self-pay

## 2021-04-21 NOTE — Telephone Encounter (Signed)
  Follow up Call-  Call back number 04/17/2021  Post procedure Call Back phone  # [phone]  Permission to leave phone message Yes  Some recent data might be hidden     Patient questions:  Do you have a fever, pain , or abdominal swelling? No. Pain Score  0 *  Have you tolerated food without any problems? Yes.    Have you been able to return to your normal activities? Yes.    Do you have any questions about your discharge instructions: Diet   No. Medications  No. Follow up visit  No.  Do you have questions or concerns about your Care? No.  Actions: * If pain score is 4 or above: No action needed, pain <4.

## 2021-04-30 ENCOUNTER — Encounter: Payer: Self-pay | Admitting: Gastroenterology

## 2021-05-20 ENCOUNTER — Ambulatory Visit: Payer: BC Managed Care – PPO | Admitting: Gastroenterology

## 2021-08-30 IMAGING — MG DIGITAL SCREENING BILAT W/ TOMO W/ CAD
8 series · 8 of 24 positions shown · non-contrast
Comparison: Previous exam(s).

CLINICAL DATA: Screening.

EXAM:
DIGITAL SCREENING BILATERAL MAMMOGRAM WITH TOMO AND CAD

[R MLO synth-2D]
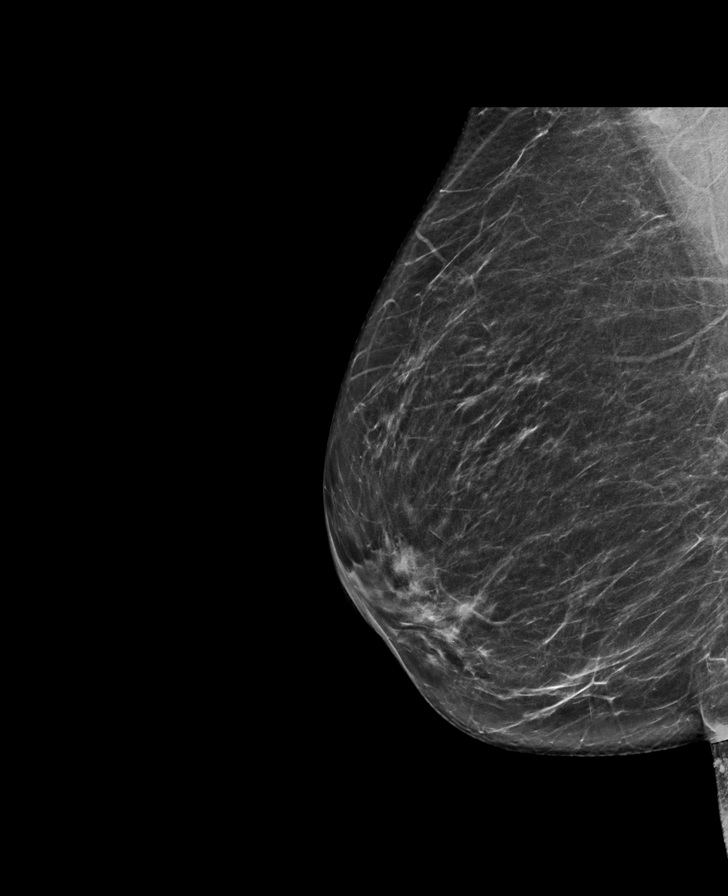

[L CC synth-2D]
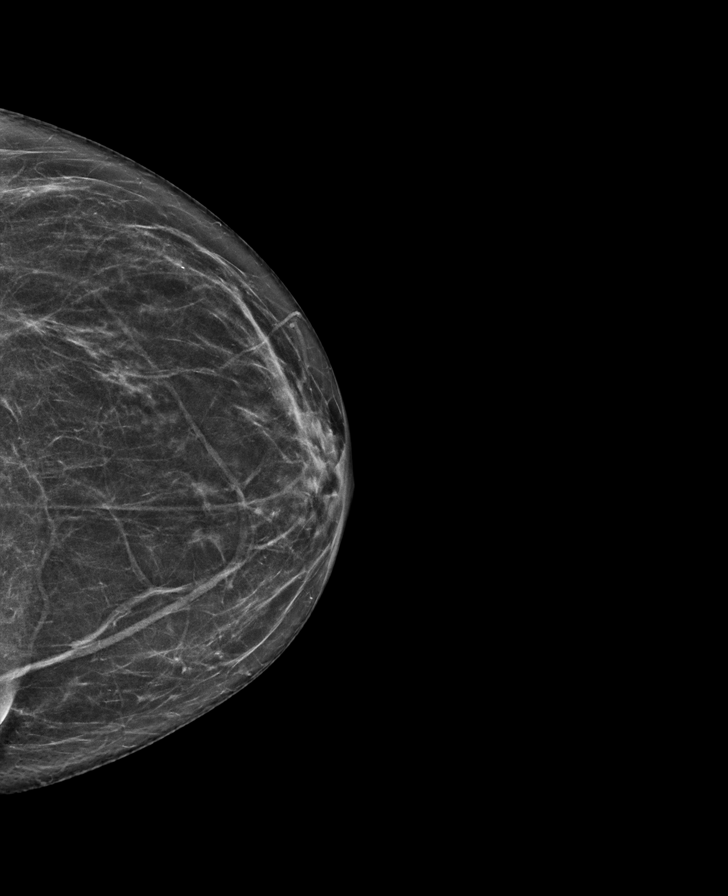

[L MLO synth-2D]
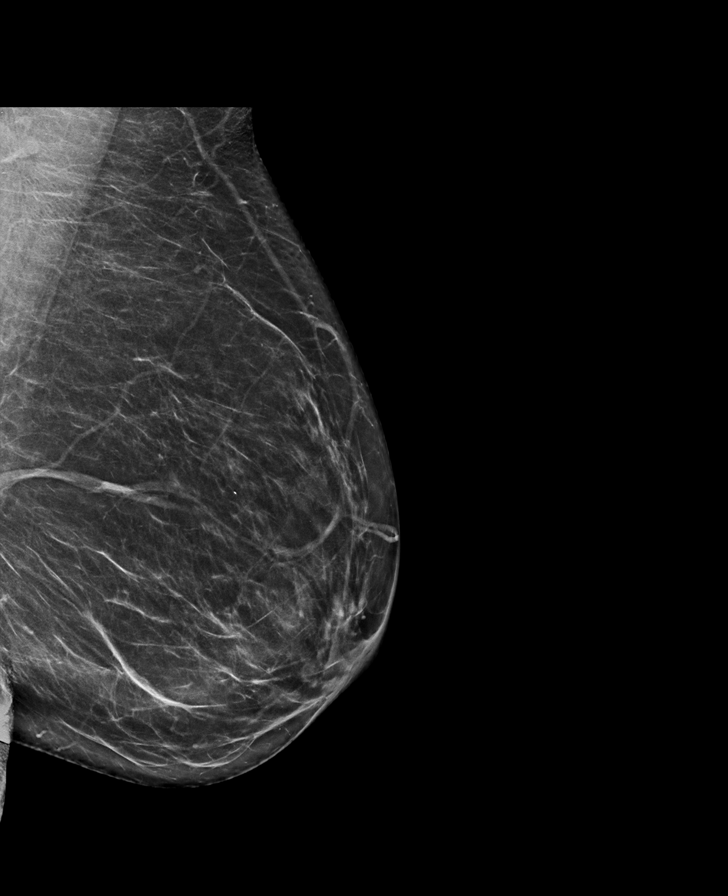

[R CC synth-2D]
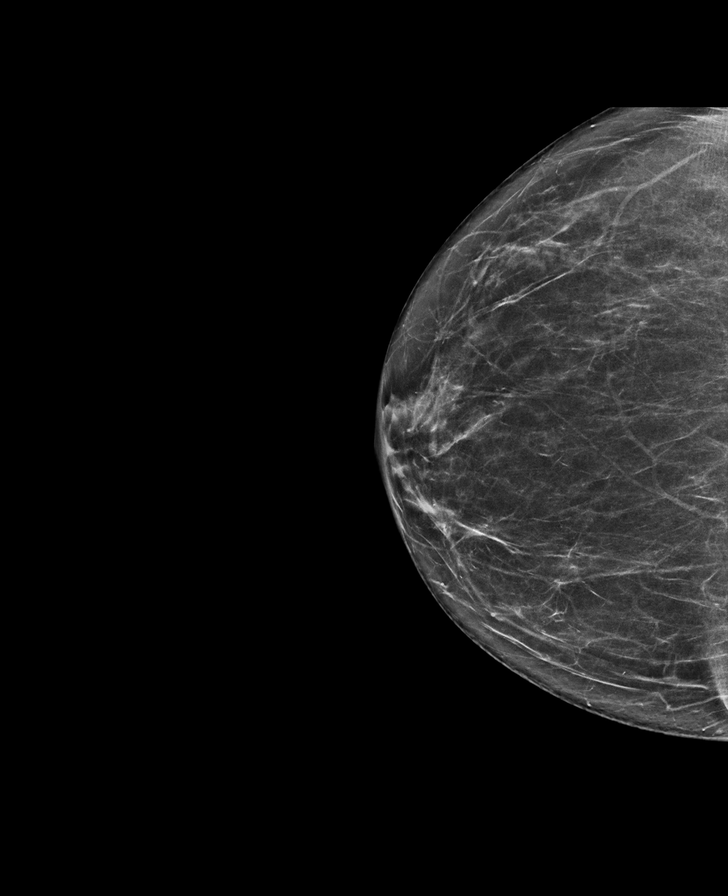

[R MLO tomo · tomo slice 35/68.0]
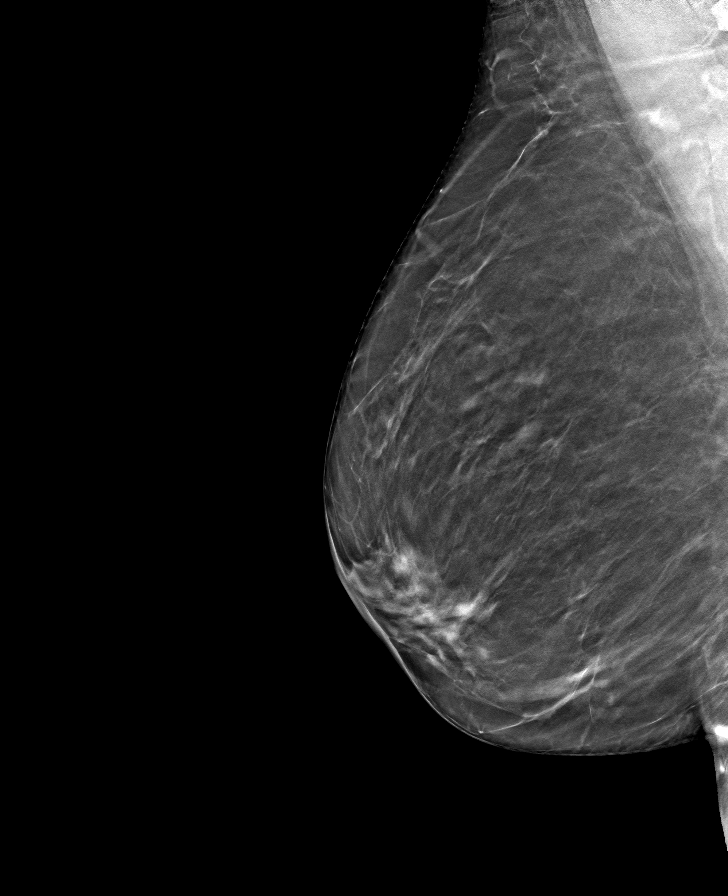

[L CC tomo · tomo slice 31/62.0]
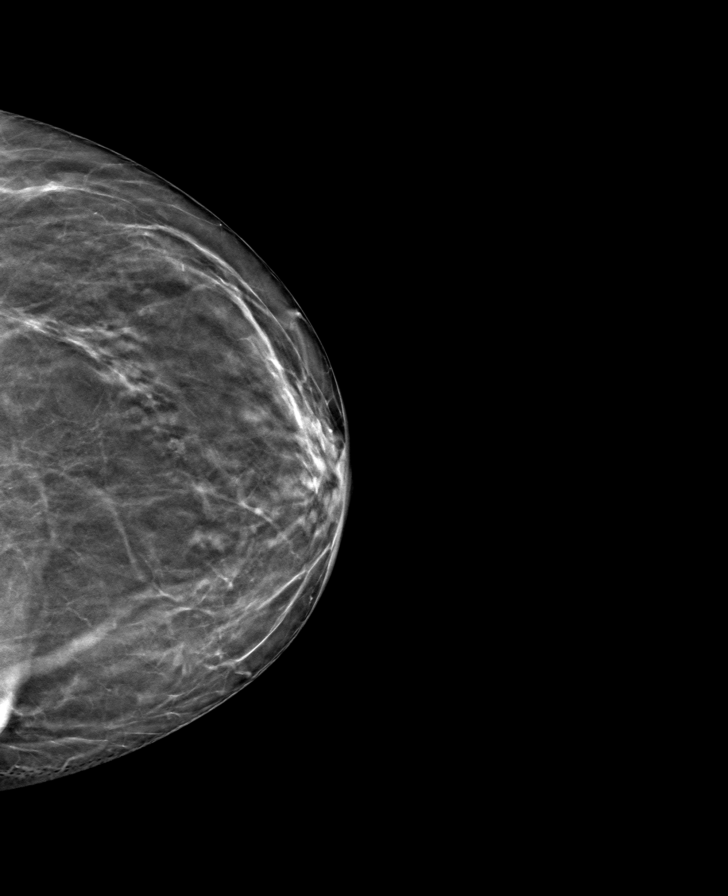

[L MLO tomo · tomo slice 35/69.0]
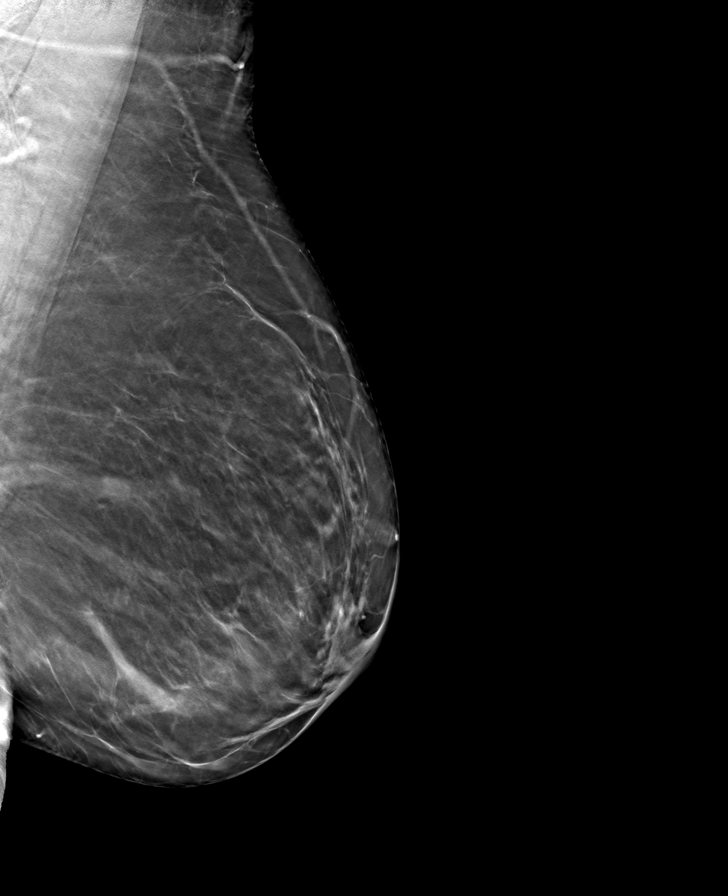

[R CC tomo · tomo slice 34/67.0]
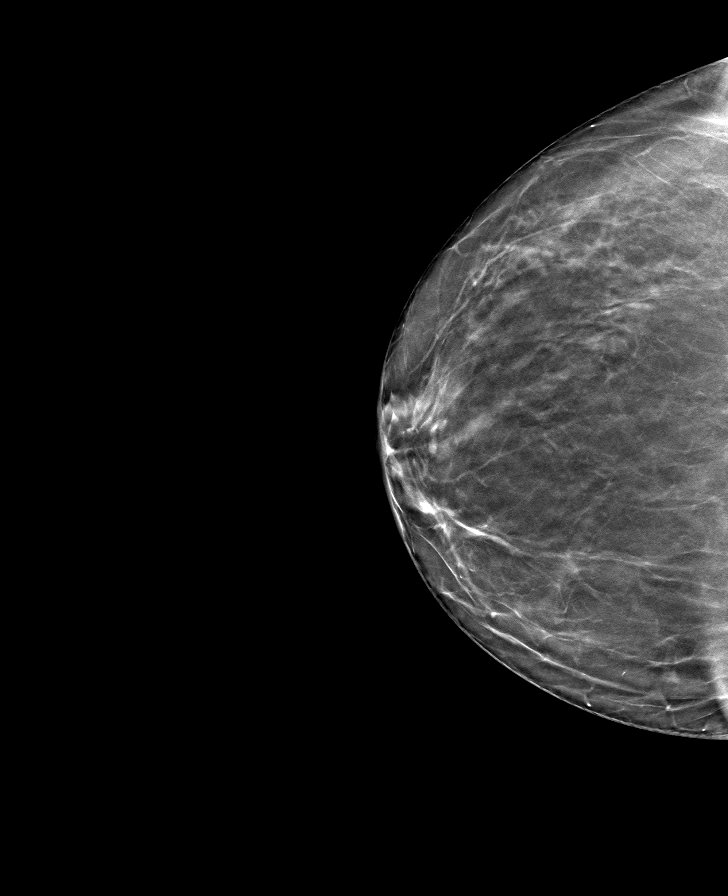

[8 of 24 positions shown; findings below may reference images not displayed]

ACR Breast Density Category b: There are scattered areas of
fibroglandular density.
FINDINGS: There are no findings suspicious for malignancy. Images were
processed with CAD.
IMPRESSION: No mammographic evidence of malignancy. A result letter of this
screening mammogram will be mailed directly to the patient.

RECOMMENDATION:
Screening mammogram in one year. (Code:[id number])

BI-RADS CATEGORY  1: Negative.

## 2021-10-04 ENCOUNTER — Other Ambulatory Visit: Payer: Self-pay | Admitting: Physician Assistant

## 2022-01-07 ENCOUNTER — Other Ambulatory Visit: Payer: Self-pay | Admitting: Gastroenterology

## 2022-04-07 ENCOUNTER — Other Ambulatory Visit: Payer: Self-pay | Admitting: Gastroenterology

## 2022-10-29 ENCOUNTER — Other Ambulatory Visit: Payer: Self-pay | Admitting: Family Medicine

## 2022-10-29 DIAGNOSIS — Z1231 Encounter for screening mammogram for malignant neoplasm of breast: Secondary | ICD-10-CM

## 2022-12-04 ENCOUNTER — Ambulatory Visit
Admission: RE | Admit: 2022-12-04 | Discharge: 2022-12-04 | Disposition: A | Discharge status: Home / Self Care | Payer: BC Managed Care – PPO | Source: Ambulatory Visit | Attending: Family Medicine | Admitting: Family Medicine

## 2022-12-04 DIAGNOSIS — Z1231 Encounter for screening mammogram for malignant neoplasm of breast: Secondary | ICD-10-CM

## 2023-07-14 DIAGNOSIS — R609 Edema, unspecified: Secondary | ICD-10-CM

## 2023-07-14 HISTORY — DX: Edema, unspecified: R60.9

## 2023-11-11 ENCOUNTER — Other Ambulatory Visit: Payer: Self-pay | Admitting: Gastroenterology

## 2023-11-11 MED ORDER — OMEPRAZOLE 20 MG PO CPDR: 20.0000 mg | DELAYED_RELEASE_CAPSULE | Freq: Two times a day (BID) | ORAL | 0 refills | Status: DC

## 2023-12-08 ENCOUNTER — Ambulatory Visit (INDEPENDENT_AMBULATORY_CARE_PROVIDER_SITE_OTHER)
Admission: RE | Admit: 2023-12-08 | Discharge: 2023-12-08 | Disposition: A | Discharge status: Home / Self Care | Source: Ambulatory Visit | Attending: Physician Assistant | Admitting: Physician Assistant

## 2023-12-08 ENCOUNTER — Ambulatory Visit: Admitting: Physician Assistant

## 2023-12-08 ENCOUNTER — Encounter: Payer: Self-pay | Admitting: Physician Assistant

## 2023-12-08 VITALS — BP 122/82 | HR 68 | Ht 64.0 in | Wt 185.0 lb

## 2023-12-08 DIAGNOSIS — K5909 Other constipation: Secondary | ICD-10-CM

## 2023-12-08 DIAGNOSIS — K5900 Constipation, unspecified: Secondary | ICD-10-CM | POA: Diagnosis not present

## 2023-12-08 DIAGNOSIS — R109 Unspecified abdominal pain: Secondary | ICD-10-CM

## 2023-12-08 DIAGNOSIS — R110 Nausea: Secondary | ICD-10-CM

## 2023-12-08 DIAGNOSIS — R6881 Early satiety: Secondary | ICD-10-CM | POA: Diagnosis not present

## 2023-12-08 NOTE — Patient Instructions (Addendum)
 Dorthy Gavia recommends that you complete a bowel purge (to clean out your bowels). Please do the following: Purchase a bottle of Miralax over the counter as well as a box of 5 mg dulcolax tablets. Take 4 dulcolax tablets. Wait 1 hour. You will then drink 6-8 capfuls of Miralax mixed in an adequate amount of water /juice/gatorade (you may choose which of these liquids to drink) over the next 2-3 hours. You should expect results within 1 to 6 hours after completing the bowel purge.   Your provider has requested that you have an abdominal x ray before leaving today. Please go to the basement floor to our Radiology department for the test.   Continue the Miralax twice to three times a day after your bowel purge   Due to recent changes in healthcare laws, you may see the results of your imaging and laboratory studies on MyChart before your provider has had a chance to review them.  We understand that in some cases there may be results that are confusing or concerning to you. Not all laboratory results come back in the same time frame and the provider may be waiting for multiple results in order to interpret others.  Please give us  48 hours in order for your provider to thoroughly review all the results before contacting the office for clarification of your results.    I appreciate the  opportunity to care for you  Thank You   Jennifer Lemmon,PA-C

## 2023-12-08 NOTE — Progress Notes (Signed)
 Chief Complaint: Diarrhea and abdominal pain  HPI:    Stacey [name] is a 63 year old female with past medical history as listed below including reflux, known to Dr. Dominic Friendly, who was referred to me by Aloha Arnold, PA-C for a complaint of diarrhea and abdominal pain.      04/17/2021 EGD normal.  Colonoscopy with a one 8 mm polyp in the cecum, one 6 mm polyp in the proximal ascending colon otherwise normal.  Pathology showed sessile serrated polyps.  Repeat recommended in 5 years.    Today, patient presents to clinic accompanied by her husband and explains that she has been to her primary care provider 2-3 times over the past month due to a change in bowel habits.  About a month ago her stool changed to green and very loose, it was very close to diarrhea, and certainly not solid.  He had terrible smell and this lasted for about a week, then she turned to just mucousy stool that was brown and irritated hemorrhoids and still no solid and now over the past 1 week she has been dealing with just mucus and no stool.  She is still passing gas and has no abdominal pain.  The last bowel movement she actually had was on 12/01/2023.  She did try a couple doses of MiraLAX but this did not do anything.  She did take mag citrate prior to all of this which helped but she has not done so over the past week.  Starting to become tender on the left side of her abdomen with the pain that radiates around to her back in the setting of chronic disc disease anyways.  Also with some nausea and decreased appetite.  Prior to this she had a regular bowel movement every day.  Did initially see a small amount of bright red blood when her hemorrhoids were aggravated, but she has not had this issue over the past couple of weeks.    Denies fever, chills, weight loss or vomiting.  Past Medical History:  Diagnosis Date   Arthritis    "left shoulder, neck, lower back"  (01/11/2018)   Chronic lower back pain    Family history of adverse  reaction to anesthesia    Mother has nausea   GERD (gastroesophageal reflux disease)    PONV (postoperative nausea and vomiting)    Pre-diabetes    UTI (urinary tract infection) 03/20/2021    Past Surgical History:  Procedure Laterality Date   APPENDECTOMY     JOINT REPLACEMENT     TOTAL SHOULDER ARTHROPLASTY Left 01/11/2018   TOTAL SHOULDER ARTHROPLASTY Left 01/11/2018   Procedure: LEFT TOTAL SHOULDER ARTHROPLASTY;  Surgeon: Janeth Medicus, MD;  Location: [facility] OR;  Service: Orthopedics;  Laterality: Left;  2.5 hrs   TOTAL SHOULDER ARTHROPLASTY Right 02/08/2020   Procedure: TOTAL SHOULDER ARTHROPLASTY;  Surgeon: Janeth Medicus, MD;  Location: [facility] OR;  Service: Orthopedics;  Laterality: Right;  2.5 hrs RNFA    Current Outpatient Medications  Medication Sig Dispense Refill   calcium carbonate (TUMS - DOSED IN MG ELEMENTAL CALCIUM) 500 MG chewable tablet Chew 1 tablet by mouth daily as needed for indigestion or heartburn.     famotidine  (PEPCID ) 20 MG tablet Take 20 mg by mouth daily.     gabapentin  (NEURONTIN ) 100 MG capsule Take 100 mg by mouth at bedtime.     meclizine  (ANTIVERT ) 25 MG tablet Take 1 tablet (25 mg total) by mouth 3 (three) times daily as needed for dizziness.  30 tablet 0   methocarbamol  (ROBAXIN ) 500 MG tablet Take 500 mg by mouth every 8 (eight) hours as needed for muscle spasms.   0   omeprazole  (PRILOSEC) 20 MG capsule Take 1 capsule (20 mg total) by mouth 2 (two) times daily before a meal. 180 capsule 0   traMADol  (ULTRAM ) 50 MG tablet Take 50 mg by mouth every 6 (six) hours as needed (pain).   0   No current facility-administered medications for this visit.    Allergies as of 12/08/2023 - Review Complete 04/17/2021  Allergen Reaction Noted   Propofol  Nausea Only 12/28/2017    Family History  Problem Relation Age of Onset   Hypertension Mother    Heart disease Father    Heart attack Father    CAD Father    Colon cancer Neg Hx    Esophageal  cancer Neg Hx    Rectal cancer Neg Hx    Stomach cancer Neg Hx     Social History   Socioeconomic History   Marital status: Married    Spouse name: Not on file   Number of children: Not on file   Years of education: Not on file   Highest education level: Not on file  Occupational History   Not on file  Tobacco Use   Smoking status: Never   Smokeless tobacco: Never  Vaping Use   Vaping status: Never Used  Substance and Sexual Activity   Alcohol use: Yes    Comment: 01/11/2018 "might have 1 drink/month"   Drug use: Never   Sexual activity: Not on file  Other Topics Concern   Not on file  Social History Narrative   Not on file   Social Drivers of Health   Financial Resource Strain: Not on file  Food Insecurity: Not on file  Transportation Needs: Not on file  Physical Activity: Not on file  Stress: Not on file  Social Connections: Not on file  Intimate Partner Violence: Not on file    Review of Systems:    Constitutional: No weight loss, fever or chills Skin: No rash Cardiovascular: No chest pain Respiratory: No SOB Gastrointestinal: See HPI and otherwise negative Genitourinary: No dysuria  Neurological: No headache, dizziness or syncope Musculoskeletal: No new muscle or joint pain Hematologic: No bruising Psychiatric: No history of depression or anxiety   Physical Exam:  Vital signs: BP 122/82 (BP Location: Left Arm, Patient Position: Sitting)   Pulse 68   Ht 5\' 4"  (1.626 m)   Wt 185 lb (83.9 kg)   SpO2 99%   BMI 31.76 kg/m    Constitutional:   Pleasant Caucasian female appears to be in NAD, Well developed, Well nourished, alert and cooperative Head:  Normocephalic and atraumatic. Eyes:   PEERL, EOMI. No icterus. Conjunctiva pink. Ears:  Normal auditory acuity. Neck:  Supple Throat: Oral cavity and pharynx without inflammation, swelling or lesion.  Respiratory: Respirations even and unlabored. Lungs clear to auscultation bilaterally.   No wheezes,  crackles, or rhonchi.  Cardiovascular: Normal S1, S2. No MRG. Regular rate and rhythm. No peripheral edema, cyanosis or pallor.  Gastrointestinal:  Soft, nondistended, mild TTP on the left side of the abdomen no rebound or guarding.  Decreased bowel sounds all 4 quadrants. No appreciable masses or hepatomegaly. Rectal:  Not performed.  Msk:  Symmetrical without gross deformities. Without edema, no deformity or joint abnormality.  Neurologic:  Alert and  oriented x4;  grossly normal neurologically.  Skin:   Dry and intact without  significant lesions or rashes. Psychiatric: Demonstrates good judgement and reason without abnormal affect or behaviors.  RELEVANT LABS AND IMAGING: CBC    Component Value Date/Time   WBC 5.3 04/05/2020 1424   RBC 4.73 04/05/2020 1424   HGB 14.3 04/05/2020 1424   HGB 14.5 09/17/2012 2231   HCT 43.3 04/05/2020 1424   HCT 41.5 09/17/2012 2231   PLT 196 04/05/2020 1424   PLT 186 09/17/2012 2231   MCV 91.5 04/05/2020 1424   MCV 92 09/17/2012 2231   MCH 30.2 04/05/2020 1424   MCHC 33.0 04/05/2020 1424   RDW 12.7 04/05/2020 1424   RDW 12.7 09/17/2012 2231   LYMPHSABS 0.9 (L) 09/17/2012 2231   MONOABS 0.5 09/17/2012 2231   EOSABS 0.0 09/17/2012 2231   BASOSABS 0.0 09/17/2012 2231    CMP     Component Value Date/Time   NA 140 04/05/2020 1424   NA 139 09/17/2012 2231   K 4.4 04/05/2020 1424   K 4.0 09/17/2012 2231   CL 106 04/05/2020 1424   CL 106 09/17/2012 2231   CO2 25 04/05/2020 1424   CO2 29 09/17/2012 2231   GLUCOSE 113 (H) 04/05/2020 1424   GLUCOSE 131 (H) 09/17/2012 2231   BUN 13 04/05/2020 1424   BUN 13 09/17/2012 2231   CREATININE 0.68 04/05/2020 1424   CREATININE 0.94 09/17/2012 2231   CALCIUM 9.1 04/05/2020 1424   CALCIUM 8.9 09/17/2012 2231   PROT 8.1 09/17/2012 2231   ALBUMIN 3.8 09/17/2012 2231   AST 21 09/17/2012 2231   ALT 23 09/17/2012 2231   ALKPHOS 98 09/17/2012 2231   BILITOT 0.3 09/17/2012 2231   GFRNONAA >60 04/05/2020  1424   GFRNONAA >60 09/17/2012 2231   GFRAA >60 04/05/2020 1424   GFRAA >60 09/17/2012 2231    Assessment: 1.  Change in bowel habits: Initially with some diarrhea and what sounds like maybe a viral gastroenteritis, now changed to constipation with no bowel movement over the past week and just mucus with associated symptoms of left-sided abdominal pain some nausea and early satiety; consider most likely postviral IBS/constipation  Plan: 1.  Ordered two-view abdominal x-ray today to consider ileus versus partial bowel obstruction given history of prior appendectomy 2.  Recommend the patient do a MiraLAX bowel purge.  This will be followed by MiraLAX 2-3 times daily. 3.  If the above is not working for her would need to consider prescription medication going forward 4.  Discussed postinfectious IBS, hopefully this will resolve itself over the next 3 to 4 months 5.  Patient to follow in clinic in 2 to 3 months or sooner if necessary.  She will let me know if anything gets worse or the purge does not work.  Reginal Capra, PA-C [facility] 12/08/2023, 10:22 AM  Cc: Aloha Arnold, PA-C

## 2023-12-09 ENCOUNTER — Ambulatory Visit: Payer: Self-pay | Admitting: Physician Assistant

## 2023-12-15 NOTE — Progress Notes (Signed)
 ____________________________________________________________  Attending physician addendum:  Thank you for sending this case to me. I have reviewed the entire note and agree with the plan.  No diverticulosis on most recent colonoscopy.  Lorella Roles, MD  ____________________________________________________________

## 2024-01-11 ENCOUNTER — Other Ambulatory Visit: Payer: Self-pay | Admitting: Gastroenterology

## 2024-02-17 ENCOUNTER — Ambulatory Visit: Admitting: Physician Assistant

## 2024-03-10 ENCOUNTER — Emergency Department
Admission: EM | Admit: 2024-03-10 | Discharge: 2024-03-10 | Disposition: A | Discharge status: Home / Self Care | Attending: Emergency Medicine | Admitting: Emergency Medicine

## 2024-03-10 ENCOUNTER — Other Ambulatory Visit: Payer: Self-pay

## 2024-03-10 ENCOUNTER — Emergency Department

## 2024-03-10 DIAGNOSIS — M545 Low back pain, unspecified: Secondary | ICD-10-CM | POA: Diagnosis present

## 2024-03-10 DIAGNOSIS — M5442 Lumbago with sciatica, left side: Secondary | ICD-10-CM | POA: Diagnosis not present

## 2024-03-10 MED ORDER — PREDNISONE 10 MG (21) PO TBPK: ORAL_TABLET | ORAL | 0 refills | Status: DC

## 2024-03-10 MED ORDER — HYDROCODONE-ACETAMINOPHEN 5-325 MG PO TABS: 1.0000 | ORAL_TABLET | Freq: Four times a day (QID) | ORAL | 0 refills | Status: AC | PRN

## 2024-03-10 MED ORDER — KETOROLAC TROMETHAMINE 60 MG/2ML IM SOLN
30.0000 mg | Freq: Once | INTRAMUSCULAR | Status: AC
  Administered 2024-03-10: 30 mg via INTRAMUSCULAR
  Filled 2024-03-10: qty 2

## 2024-03-10 MED ORDER — OXYCODONE HCL 5 MG PO TABS
5.0000 mg | ORAL_TABLET | Freq: Once | ORAL | Status: AC
  Administered 2024-03-10: 5 mg via ORAL
  Filled 2024-03-10: qty 1

## 2024-03-10 NOTE — Discharge Instructions (Signed)
 Call and schedule follow-up appointment with your orthopedist.  Return to the emergency department for symptoms that change or worsen if unable to schedule an appointment with orthopedics or primary care.

## 2024-03-10 NOTE — ED Triage Notes (Signed)
 Pt to ED via POV from home. Pt reports left lower back pain with radiation to left leg. + numbness and tingling in leg. No loss of bowel or bladder.

## 2024-03-10 NOTE — ED Notes (Signed)
 See triage note  Presents with lower back pain  States she has a hx of same  Currently being seen by ortho   Ambulates well to treatment room  Denies any bowel or bladder issues

## 2024-03-10 NOTE — ED Provider Notes (Signed)
   [facility] Provider Note    Event Date/Time   First MD Initiated Contact with Patient 03/10/24 [phone]     (approximate)   History   Back Pain   HPI  Stacey [name] is a 63 y.o. female with history of GERD, and as listed in EMR presents to the emergency department for treatment and evaluation of low back pain that radiates down the left leg.  She has had similar symptoms in the past and has received steroid injections.  No relief with tramadol , Robaxin , and gabapentin .     Physical Exam    Vitals:   03/10/24 0812  BP: (!) 181/95  Pulse: 72  Resp: 18  Temp: 98.9 F (37.2 C)  SpO2: 99%    General: Awake, no distress.  CV:  Good peripheral perfusion.  Resp:  Normal effort.  Abd:  No distention.  Other:  Ambulatory without assistance   ED Results / Procedures / Treatments   Labs (all labs ordered are listed, but only abnormal results are displayed)  Labs Reviewed - No data to display   EKG  Not indicated   RADIOLOGY  Image and radiology report reviewed and interpreted by me. Radiology report consistent with the same.  Not indicated  PROCEDURES:  Critical Care performed: No  Procedures   MEDICATIONS ORDERED IN ED:  Medications  ketorolac  (TORADOL ) injection 30 mg (30 mg Intramuscular Given 03/10/24 0836)  oxyCODONE  (Oxy IR/ROXICODONE ) immediate release tablet 5 mg (5 mg Oral Given 03/10/24 0836)     IMPRESSION / MDM / ASSESSMENT AND PLAN / ED COURSE   I have reviewed the triage note and vital signs. Vital signs are stable   Differential diagnosis includes, but is not limited to, sciatica, degenerative disc disease, herniated disc  Patient's presentation is most consistent with acute illness / injury with system symptoms.  63 year old female presenting to the emergency department for treatment and evaluation of acute on chronic low back pain that radiates down the left leg.  See HPI for further details.  No new  injury.  She is ambulatory without assistance.  No indication today for imaging.  Plan will be to have her stop the tramadol  and take Norco instead.  She will also be given a Steroid pack.  Outpatient follow up and ER return precautions given.      FINAL CLINICAL IMPRESSION(S) / ED DIAGNOSES   Final diagnoses:  Acute back pain with sciatica, left     Rx / DC Orders   ED Discharge Orders          Ordered    HYDROcodone -acetaminophen  (NORCO/VICODIN) 5-325 MG tablet  Every 6 hours PRN        03/10/24 0833    predniSONE  (STERAPRED UNI-PAK 21 TAB) 10 MG (21) TBPK tablet        03/10/24 9166             Note:  This document was prepared using Dragon voice recognition software and may include unintentional dictation errors.   Stacey Kirk NOVAK, FNP 03/10/24 1318    Dicky Anes, MD 03/10/24 [phone]

## 2024-03-30 ENCOUNTER — Emergency Department

## 2024-03-30 ENCOUNTER — Other Ambulatory Visit: Payer: Self-pay

## 2024-03-30 ENCOUNTER — Other Ambulatory Visit
Admission: RE | Admit: 2024-03-30 | Discharge: 2024-03-30 | Disposition: A | Discharge status: Home / Self Care | Source: Ambulatory Visit | Attending: Internal Medicine | Admitting: Internal Medicine

## 2024-03-30 ENCOUNTER — Emergency Department
Admission: EM | Admit: 2024-03-30 | Discharge: 2024-03-30 | Disposition: A | Discharge status: Home / Self Care | Source: Ambulatory Visit | Attending: Emergency Medicine | Admitting: Emergency Medicine

## 2024-03-30 DIAGNOSIS — M79605 Pain in left leg: Secondary | ICD-10-CM

## 2024-03-30 DIAGNOSIS — R7989 Other specified abnormal findings of blood chemistry: Secondary | ICD-10-CM | POA: Insufficient documentation

## 2024-03-30 DIAGNOSIS — M79662 Pain in left lower leg: Secondary | ICD-10-CM | POA: Insufficient documentation

## 2024-03-30 LAB — D-DIMER, QUANTITATIVE: D-Dimer, Quant: 1.14 ug{FEU}/mL — ABNORMAL HIGH (ref 0.00–0.50)

## 2024-03-30 MED ORDER — OXYCODONE-ACETAMINOPHEN 5-325 MG PO TABS
1.0000 | ORAL_TABLET | Freq: Once | ORAL | Status: AC
  Administered 2024-03-30: 1 via ORAL
  Filled 2024-03-30: qty 1

## 2024-03-30 NOTE — ED Provider Notes (Signed)
 [facility] Provider Note    Event Date/Time   First MD Initiated Contact with Patient 03/30/24 1755     (approximate)   History   Chief Complaint Abnormal Labs   HPI  Stacey [name] is a 63 y.o. female with past medical history of GERD and arthritis who presents to the ED complaining of abnormal labs.  Patient reports that she has been dealing with about 1 month of increasing pain in her left lower leg, particularly around the lateral portion of her left calf.  She states this area has been swollen and tender to touch, but she denies any associated skin redness or wounds.  She has not had any trauma to this area.  She was evaluated at the walk-in clinic earlier today, found to have an elevated D-dimer and referred to the ED for ultrasound.  She denies any chest pain or shortness of breath.      Physical Exam   Triage Vital Signs: ED Triage Vitals  Encounter Vitals Group     BP 03/30/24 1737 (!) 156/75     Girls Systolic BP Percentile --      Girls Diastolic BP Percentile --      Boys Systolic BP Percentile --      Boys Diastolic BP Percentile --      Pulse Rate 03/30/24 1737 73     Resp 03/30/24 1737 18     Temp 03/30/24 1737 98.5 F (36.9 C)     Temp Source 03/30/24 1737 Oral     SpO2 03/30/24 1737 100 %     Weight 03/30/24 1737 177 lb (80.3 kg)     Height 03/30/24 1737 5' 4 (1.626 m)     Head Circumference --      Peak Flow --      Pain Score 03/30/24 1736 10     Pain Loc --      Pain Education --      Exclude from Growth Chart --     Most recent vital signs: Vitals:   03/30/24 1737 03/30/24 1801  BP: (!) 156/75   Pulse: 73   Resp: 18   Temp: 98.5 F (36.9 C)   SpO2: 100% 100%    Constitutional: Alert and oriented. Eyes: Conjunctivae are normal. Head: Atraumatic. Nose: No congestion/rhinnorhea. Mouth/Throat: Mucous membranes are moist.  Cardiovascular: Normal rate, regular rhythm. Grossly normal heart sounds.  2+ radial  and DP pulses bilaterally. Respiratory: Normal respiratory effort.  No retractions. Lungs CTAB. Gastrointestinal: Soft and nontender. No distention. Musculoskeletal: Trace edema to the left lower leg with associated calf tenderness, no erythema or warmth noted. Neurologic:  Normal speech and language. No gross focal neurologic deficits are appreciated.    ED Results / Procedures / Treatments   Labs (all labs ordered are listed, but only abnormal results are displayed) Labs Reviewed - No data to display   RADIOLOGY Left lower extremity ultrasound reviewed and interpreted by me with no evidence of DVT.  PROCEDURES:  Critical Care performed: No  Procedures   MEDICATIONS ORDERED IN ED: Medications  oxyCODONE -acetaminophen  (PERCOCET/ROXICET) 5-325 MG per tablet 1 tablet (1 tablet Oral Given 03/30/24 1838)     IMPRESSION / MDM / ASSESSMENT AND PLAN / ED COURSE  I reviewed the triage vital signs and the nursing notes.                              63  y.o. female with past medical history of GERD and arthritis who presents to the ED complaining of pain and swelling in the left lower leg worsening over the past month.  Patient's presentation is most consistent with acute complicated illness / injury requiring diagnostic workup.  Differential diagnosis includes, but is not limited to, DVT, venous insufficiency, arterial insufficiency, cellulitis, muscle strain.  Patient nontoxic-appearing and in no acute distress, vital signs are unremarkable.  She is neurovascular intact to her left lower extremity with no signs of infection.  We will further assess for DVT with ultrasound, no symptoms to suggest PE.  Plan to treat symptomatically with Percocet and reassess following imaging.  Ultrasound is negative for DVT, pain improved on reassessment.  Suspect muscle strain versus venous insufficiency and patient appropriate for discharge home with outpatient follow-up.  She was counseled to  return to the ED for new or worsening symptoms, patient agrees with plan.      FINAL CLINICAL IMPRESSION(S) / ED DIAGNOSES   Final diagnoses:  Left leg pain     Rx / DC Orders   ED Discharge Orders     None        Note:  This document was prepared using Dragon voice recognition software and may include unintentional dictation errors.   Willo Dunnings, MD 03/30/24 [phone]

## 2024-03-30 NOTE — ED Triage Notes (Signed)
 Patient states left lower leg swelling. Was seen at [facility] earlier for same and had blood work; was called PTA with elevated d-dimer.

## 2024-04-08 ENCOUNTER — Observation Stay
Admission: EM | Admit: 2024-04-08 | Discharge: 2024-04-10 | Disposition: A | Discharge status: Home / Self Care | Attending: Internal Medicine | Admitting: Internal Medicine

## 2024-04-08 ENCOUNTER — Emergency Department

## 2024-04-08 ENCOUNTER — Other Ambulatory Visit: Payer: Self-pay

## 2024-04-08 ENCOUNTER — Encounter
Admission: EM | Disposition: A | Discharge status: Home / Self Care | Payer: Self-pay | Source: Home / Self Care | Attending: Emergency Medicine

## 2024-04-08 ENCOUNTER — Observation Stay

## 2024-04-08 ENCOUNTER — Observation Stay: Admitting: Registered Nurse

## 2024-04-08 DIAGNOSIS — N1330 Unspecified hydronephrosis: Secondary | ICD-10-CM | POA: Diagnosis present

## 2024-04-08 DIAGNOSIS — K5900 Constipation, unspecified: Secondary | ICD-10-CM

## 2024-04-08 DIAGNOSIS — N132 Hydronephrosis with renal and ureteral calculous obstruction: Principal | ICD-10-CM | POA: Insufficient documentation

## 2024-04-08 DIAGNOSIS — C83398 Diffuse large b-cell lymphoma of other extranodal and solid organ sites: Secondary | ICD-10-CM | POA: Diagnosis not present

## 2024-04-08 DIAGNOSIS — Z96612 Presence of left artificial shoulder joint: Secondary | ICD-10-CM | POA: Insufficient documentation

## 2024-04-08 DIAGNOSIS — R1032 Left lower quadrant pain: Secondary | ICD-10-CM

## 2024-04-08 DIAGNOSIS — R109 Unspecified abdominal pain: Secondary | ICD-10-CM | POA: Diagnosis present

## 2024-04-08 DIAGNOSIS — M545 Low back pain, unspecified: Secondary | ICD-10-CM

## 2024-04-08 DIAGNOSIS — R7303 Prediabetes: Secondary | ICD-10-CM | POA: Diagnosis not present

## 2024-04-08 DIAGNOSIS — N179 Acute kidney failure, unspecified: Secondary | ICD-10-CM | POA: Insufficient documentation

## 2024-04-08 DIAGNOSIS — Z96611 Presence of right artificial shoulder joint: Secondary | ICD-10-CM | POA: Diagnosis not present

## 2024-04-08 HISTORY — PX: CYSTOSCOPY W/ URETERAL STENT PLACEMENT: SHX1429

## 2024-04-08 LAB — BASIC METABOLIC PANEL WITH GFR
Anion gap: 9 (ref 5–15)
BUN: 21 mg/dL (ref 8–23)
CO2: 26 mmol/L (ref 22–32)
Calcium: 8.7 mg/dL — ABNORMAL LOW (ref 8.9–10.3)
Chloride: 102 mmol/L (ref 98–111)
Creatinine, Ser: 1.33 mg/dL — ABNORMAL HIGH (ref 0.44–1.00)
GFR, Estimated: 46 mL/min — ABNORMAL LOW (ref >60)
Glucose, Bld: 112 mg/dL — ABNORMAL HIGH (ref 70–99)
Potassium: 4.7 mmol/L (ref 3.5–5.1)
Sodium: 137 mmol/L (ref 135–145)

## 2024-04-08 LAB — CBC
HCT: 40.2 % (ref 36.0–46.0)
Hemoglobin: 13.6 g/dL (ref 12.0–15.0)
MCH: 31.4 pg (ref 26.0–34.0)
MCHC: 33.8 g/dL (ref 30.0–36.0)
MCV: 92.8 fL (ref 80.0–100.0)
Platelets: 308 K/uL (ref 150–400)
RBC: 4.33 MIL/uL (ref 3.87–5.11)
RDW: 13.2 % (ref 11.5–15.5)
WBC: 10.5 K/uL (ref 4.0–10.5)
nRBC: 0 % (ref 0.0–0.2)

## 2024-04-08 LAB — HEPATIC FUNCTION PANEL
ALT: 14 U/L (ref 0–44)
AST: 19 U/L (ref 15–41)
Albumin: 3.5 g/dL (ref 3.5–5.0)
Alkaline Phosphatase: 68 U/L (ref 38–126)
Bilirubin, Direct: 0.2 mg/dL (ref 0.0–0.2)
Indirect Bilirubin: 0.5 mg/dL (ref 0.3–0.9)
Total Bilirubin: 0.7 mg/dL (ref 0.0–1.2)
Total Protein: 7.6 g/dL (ref 6.5–8.1)

## 2024-04-08 LAB — URINALYSIS, ROUTINE W REFLEX MICROSCOPIC
Bilirubin Urine: NEGATIVE
Glucose, UA: NEGATIVE mg/dL
Hgb urine dipstick: NEGATIVE
Ketones, ur: NEGATIVE mg/dL
Leukocytes,Ua: NEGATIVE
Nitrite: NEGATIVE
Protein, ur: NEGATIVE mg/dL
Specific Gravity, Urine: 1.019 (ref 1.005–1.030)
pH: 7 (ref 5.0–8.0)

## 2024-04-08 LAB — LACTIC ACID, PLASMA: Lactic Acid, Venous: 1.3 mmol/L (ref 0.5–1.9)

## 2024-04-08 SURGERY — CYSTOSCOPY, WITH RETROGRADE PYELOGRAM AND URETERAL STENT INSERTION
Anesthesia: General | Site: Ureter | Laterality: Left

## 2024-04-08 MED ORDER — LACTATED RINGERS IV BOLUS
1000.0000 mL | Freq: Once | INTRAVENOUS | Status: AC
  Administered 2024-04-08: 1000 mL via INTRAVENOUS

## 2024-04-08 MED ORDER — ONDANSETRON HCL 4 MG/2ML IJ SOLN
INTRAMUSCULAR | Status: AC
  Filled 2024-04-08: qty 2

## 2024-04-08 MED ORDER — FENTANYL CITRATE (PF) 100 MCG/2ML IJ SOLN
INTRAMUSCULAR | Status: DC | PRN
  Administered 2024-04-08: 50 ug via INTRAVENOUS
  Administered 2024-04-08 (×2): 25 ug via INTRAVENOUS

## 2024-04-08 MED ORDER — GABAPENTIN 100 MG PO CAPS
100.0000 mg | ORAL_CAPSULE | Freq: Every day | ORAL | Status: DC
  Administered 2024-04-08 – 2024-04-09 (×2): 100 mg via ORAL
  Filled 2024-04-08 (×2): qty 1

## 2024-04-08 MED ORDER — ACETAMINOPHEN 10 MG/ML IV SOLN: 1000.0000 mg | Freq: Once | INTRAVENOUS | Status: DC | PRN

## 2024-04-08 MED ORDER — MIDAZOLAM HCL 2 MG/2ML IJ SOLN
INTRAMUSCULAR | Status: DC | PRN
  Administered 2024-04-08: 2 mg via INTRAVENOUS

## 2024-04-08 MED ORDER — SODIUM CHLORIDE 0.9 % IV SOLN: INTRAVENOUS | Status: DC | PRN

## 2024-04-08 MED ORDER — FENTANYL CITRATE (PF) 100 MCG/2ML IJ SOLN
INTRAMUSCULAR | Status: AC
  Filled 2024-04-08: qty 2

## 2024-04-08 MED ORDER — PANTOPRAZOLE SODIUM 40 MG PO TBEC
40.0000 mg | DELAYED_RELEASE_TABLET | Freq: Every day | ORAL | Status: DC
  Administered 2024-04-08 – 2024-04-09 (×2): 40 mg via ORAL
  Filled 2024-04-08 (×2): qty 1

## 2024-04-08 MED ORDER — DROPERIDOL 2.5 MG/ML IJ SOLN: 0.6250 mg | Freq: Once | INTRAMUSCULAR | Status: DC | PRN

## 2024-04-08 MED ORDER — PROPOFOL 10 MG/ML IV BOLUS
INTRAVENOUS | Status: AC
  Filled 2024-04-08: qty 20

## 2024-04-08 MED ORDER — FENTANYL CITRATE (PF) 100 MCG/2ML IJ SOLN: 25.0000 ug | INTRAMUSCULAR | Status: DC | PRN

## 2024-04-08 MED ORDER — ACETAMINOPHEN 650 MG RE SUPP: 650.0000 mg | Freq: Four times a day (QID) | RECTAL | Status: DC | PRN

## 2024-04-08 MED ORDER — DEXAMETHASONE SODIUM PHOSPHATE 10 MG/ML IJ SOLN
INTRAMUSCULAR | Status: AC
  Filled 2024-04-08: qty 1

## 2024-04-08 MED ORDER — ONDANSETRON HCL 4 MG/2ML IJ SOLN
INTRAMUSCULAR | Status: DC | PRN
  Administered 2024-04-08: 4 mg via INTRAVENOUS

## 2024-04-08 MED ORDER — INFLUENZA VIRUS VACC SPLIT PF (FLUZONE) 0.5 ML IM SUSY
0.5000 mL | PREFILLED_SYRINGE | INTRAMUSCULAR | Status: DC
Start: 2024-04-09 — End: 2024-04-10

## 2024-04-08 MED ORDER — MORPHINE SULFATE (PF) 4 MG/ML IV SOLN
4.0000 mg | INTRAVENOUS | Status: DC | PRN
  Administered 2024-04-08 – 2024-04-10 (×4): 4 mg via INTRAVENOUS
  Filled 2024-04-08 (×4): qty 1

## 2024-04-08 MED ORDER — ACETAMINOPHEN 500 MG PO TABS
1000.0000 mg | ORAL_TABLET | Freq: Once | ORAL | Status: AC
Start: 2024-04-08 — End: 2024-04-08
  Administered 2024-04-08: 1000 mg via ORAL
  Filled 2024-04-08: qty 2

## 2024-04-08 MED ORDER — LIDOCAINE HCL (PF) 2 % IJ SOLN
INTRAMUSCULAR | Status: AC
  Filled 2024-04-08: qty 5

## 2024-04-08 MED ORDER — ONDANSETRON HCL 4 MG/2ML IJ SOLN
4.0000 mg | Freq: Once | INTRAMUSCULAR | Status: AC
  Administered 2024-04-08: 4 mg via INTRAVENOUS
  Filled 2024-04-08: qty 2

## 2024-04-08 MED ORDER — IOHEXOL 350 MG/ML SOLN
100.0000 mL | Freq: Once | INTRAVENOUS | Status: AC | PRN
  Administered 2024-04-08: 100 mL via INTRAVENOUS

## 2024-04-08 MED ORDER — LIDOCAINE 5 % EX PTCH
1.0000 | MEDICATED_PATCH | CUTANEOUS | Status: DC
  Administered 2024-04-08 – 2024-04-10 (×3): 1 via TRANSDERMAL
  Filled 2024-04-08 (×3): qty 1

## 2024-04-08 MED ORDER — ACETAMINOPHEN 325 MG PO TABS: 650.0000 mg | ORAL_TABLET | Freq: Four times a day (QID) | ORAL | Status: DC | PRN

## 2024-04-08 MED ORDER — OXYCODONE HCL 5 MG/5ML PO SOLN: 5.0000 mg | Freq: Once | ORAL | Status: DC | PRN

## 2024-04-08 MED ORDER — LIDOCAINE HCL (CARDIAC) PF 100 MG/5ML IV SOSY
PREFILLED_SYRINGE | INTRAVENOUS | Status: DC | PRN
  Administered 2024-04-08: 100 mg via INTRAVENOUS

## 2024-04-08 MED ORDER — IOHEXOL 180 MG/ML  SOLN
INTRAMUSCULAR | Status: DC | PRN
  Administered 2024-04-08: 10 mL

## 2024-04-08 MED ORDER — PROPOFOL 10 MG/ML IV BOLUS
INTRAVENOUS | Status: DC | PRN
  Administered 2024-04-08: 150 mg via INTRAVENOUS

## 2024-04-08 MED ORDER — EPHEDRINE SULFATE-NACL 50-0.9 MG/10ML-% IV SOSY
PREFILLED_SYRINGE | INTRAVENOUS | Status: DC | PRN
  Administered 2024-04-08: 10 mg via INTRAVENOUS

## 2024-04-08 MED ORDER — OXYCODONE HCL 5 MG PO TABS: 5.0000 mg | ORAL_TABLET | Freq: Once | ORAL | Status: DC | PRN

## 2024-04-08 MED ORDER — MORPHINE SULFATE (PF) 4 MG/ML IV SOLN
6.0000 mg | Freq: Once | INTRAVENOUS | Status: AC
  Administered 2024-04-08: 6 mg via INTRAVENOUS
  Filled 2024-04-08: qty 2

## 2024-04-08 MED ORDER — DEXMEDETOMIDINE HCL IN NACL 80 MCG/20ML IV SOLN
INTRAVENOUS | Status: DC | PRN
  Administered 2024-04-08: 8 ug via INTRAVENOUS

## 2024-04-08 MED ORDER — SODIUM CHLORIDE 0.9 % IR SOLN
Status: DC | PRN
  Administered 2024-04-08: 3000 mL via INTRAVESICAL

## 2024-04-08 MED ORDER — ACETAMINOPHEN 500 MG PO TABS
1000.0000 mg | ORAL_TABLET | Freq: Once | ORAL | Status: AC
  Administered 2024-04-08: 1000 mg via ORAL
  Filled 2024-04-08: qty 2

## 2024-04-08 MED ORDER — FLEET ENEMA RE ENEM: 1.0000 | ENEMA | Freq: Every day | RECTAL | 0 refills | Status: DC | PRN

## 2024-04-08 MED ORDER — SODIUM CHLORIDE 0.9 % IV SOLN: INTRAVENOUS | Status: AC

## 2024-04-08 MED ORDER — DEXAMETHASONE SODIUM PHOSPHATE 10 MG/ML IJ SOLN
INTRAMUSCULAR | Status: DC | PRN
Start: 2024-04-08 — End: 2024-04-08
  Administered 2024-04-08: 10 mg via INTRAVENOUS

## 2024-04-08 MED ORDER — ONDANSETRON HCL 4 MG/2ML IJ SOLN
4.0000 mg | Freq: Four times a day (QID) | INTRAMUSCULAR | Status: DC | PRN
  Administered 2024-04-08: 4 mg via INTRAVENOUS
  Filled 2024-04-08: qty 2

## 2024-04-08 MED ORDER — MIDAZOLAM HCL 2 MG/2ML IJ SOLN
INTRAMUSCULAR | Status: AC
  Filled 2024-04-08: qty 2

## 2024-04-08 MED ORDER — SODIUM CHLORIDE 0.9 % IV SOLN
1.0000 g | Freq: Once | INTRAVENOUS | Status: AC
  Administered 2024-04-08: 1 g via INTRAVENOUS
  Filled 2024-04-08: qty 10

## 2024-04-08 MED ORDER — MORPHINE SULFATE (PF) 4 MG/ML IV SOLN
4.0000 mg | Freq: Once | INTRAVENOUS | Status: AC
  Administered 2024-04-08: 4 mg via INTRAVENOUS
  Filled 2024-04-08: qty 1

## 2024-04-08 SURGICAL SUPPLY — 18 items
BAG DRAIN SIEMENS DORNER NS (MISCELLANEOUS) ×2 IMPLANT
CATH URETL OPEN 5X70 (CATHETERS) ×2 IMPLANT
GAUZE 4X4 16PLY ~~LOC~~+RFID DBL (SPONGE) ×4 IMPLANT
GLOVE BIO SURGEON STRL SZ8 (GLOVE) ×2 IMPLANT
GOWN STRL REUS W/ TWL LRG LVL4 (GOWN DISPOSABLE) ×2 IMPLANT
GOWN STRL REUS W/ TWL XL LVL3 (GOWN DISPOSABLE) ×2 IMPLANT
GUIDEWIRE STR DUAL SENSOR (WIRE) ×4 IMPLANT
KIT TURNOVER CYSTO (KITS) ×2 IMPLANT
MANIFOLD NEPTUNE II (INSTRUMENTS) ×2 IMPLANT
PACK CYSTO AR (MISCELLANEOUS) ×2 IMPLANT
SET CYSTO W/LG BORE CLAMP LF (SET/KITS/TRAYS/PACK) ×2 IMPLANT
SOL .9 NS 3000ML IRR UROMATIC (IV SOLUTION) ×2 IMPLANT
SOLN STERILE WATER 1000 ML (IV SOLUTION) ×1 IMPLANT
SOLN STERILE WATER BTL 1000 ML (IV SOLUTION) ×2 IMPLANT
STENT URET 6FRX24 CONTOUR (STENTS) IMPLANT
STENT URET 6FRX26 CONTOUR (STENTS) IMPLANT
TRAP FLUID SMOKE EVACUATOR (MISCELLANEOUS) ×2 IMPLANT
WATER STERILE IRR 500ML POUR (IV SOLUTION) ×2 IMPLANT

## 2024-04-08 NOTE — ED Notes (Signed)
Back from CT, c/o pain increasing.

## 2024-04-08 NOTE — Transfer of Care (Signed)
 Immediate Anesthesia Transfer of Care Note  Patient: Stacey [name]  Procedure(s) Performed: Procedure(s): CYSTOSCOPY, WITH RETROGRADE PYELOGRAM AND URETERAL STENT INSERTION (Left)  Patient Location: PACU  Anesthesia Type:General  Level of Consciousness: sedated  Airway & Oxygen Therapy: Patient Spontanous Breathing and Patient connected to face mask oxygen  Post-op Assessment: Report given to RN and Post -op Vital signs reviewed and stable  Post vital signs: Reviewed and stable  Last Vitals:  Vitals:   04/08/24 1325 04/08/24 1452  BP:  115/61  Pulse: 76   Resp: 16   Temp:    SpO2: 100%     Complications: No apparent anesthesia complications

## 2024-04-08 NOTE — ED Notes (Signed)
 OR/pre-op staff at [facility], preparing to go to OR. Family at [facility]. Morphine  and zofran  given.

## 2024-04-08 NOTE — ED Provider Notes (Addendum)
 Stacey [name] Provider Note    Event Date/Time   First MD Initiated Contact with Patient 04/08/24 [phone]     (approximate)   History   Flank Pain and Constipation   HPI  Stacey [name] is a 63 y.o. female with history of chronic back pain with sciatica, GERD, presenting with left lower back pain, flank pain that radiates to her left lower quadrant.  Patient states that started 3 days ago, is constant, no urinary symptoms, no nausea or vomiting.  States that she has not had a bowel movement for the last several days.  Has been taking MiraLAX without any improvement.  She denies any fever, diarrhea, chest pain, shortness of breath.  States that she noted some tingling to her left leg but no actual numbness or weakness.  No trauma or falls.  States that she does have history of chronic back pain with sciatica on the left side, had an injection done with [facility] with resolution of that sciatic pain.  States that this pain is different.  Denies any incontinence or saddle anesthesia.  On independent chart review, she was seen by [facility] in September, had an MRI that showed multilevel lumbar degenerative disc disease with canal stenosis.  Was requiring tramadol  daily for pain.     Physical Exam   Triage Vital Signs: ED Triage Vitals  Encounter Vitals Group     BP 04/08/24 0154 (!) 152/90     Girls Systolic BP Percentile --      Girls Diastolic BP Percentile --      Boys Systolic BP Percentile --      Boys Diastolic BP Percentile --      Pulse Rate 04/08/24 0154 77     Resp 04/08/24 0154 18     Temp 04/08/24 0154 98.7 F (37.1 C)     Temp Source 04/08/24 0554 Oral     SpO2 04/08/24 0154 99 %     Weight 04/08/24 0156 180 lb (81.6 kg)     Height 04/08/24 0156 5' 4 (1.626 m)     Head Circumference --      Peak Flow --      Pain Score 04/08/24 0206 10     Pain Loc --      Pain Education --      Exclude from Growth Chart --     Most recent vital  signs: Vitals:   04/08/24 0554 04/08/24 0557  BP: (!) 185/83   Pulse: 73   Resp: 18   Temp:  99 F (37.2 C)  SpO2: 100%      General: Awake, no distress.  CV:  Good peripheral perfusion.  Resp:  Normal effort.  Abd:  No distention.  Soft, mildly tender to the left lower quadrant Other:  She does have left lower flank as well as left lower paralumbar tenderness on palpation without overlying rash, ecchymosis, swelling, she has no saddle anesthesia, no midline spinal tenderness, no focal weakness or numbness, she has palpable DP pulses bilaterally that are equal.   ED Results / Procedures / Treatments   Labs (all labs ordered are listed, but only abnormal results are displayed) Labs Reviewed  URINALYSIS, ROUTINE W REFLEX MICROSCOPIC - Abnormal; Notable for the following components:      Result Value   Color, Urine YELLOW (*)    APPearance CLEAR (*)    All other components within normal limits  BASIC METABOLIC PANEL WITH GFR - Abnormal; Notable for the  following components:   Glucose, Bld 112 (*)    Creatinine, Ser 1.33 (*)    Calcium 8.7 (*)    GFR, Estimated 46 (*)    All other components within normal limits  CBC  LACTIC ACID, PLASMA  LACTIC ACID, PLASMA  HEPATIC FUNCTION PANEL      RADIOLOGY On my independent interpretation, CT shows left hydronephrosis   PROCEDURES:  Critical Care performed: No  Procedures   MEDICATIONS ORDERED IN ED: Medications  lidocaine  (LIDODERM ) 5 % 1 patch (1 patch Transdermal Patch Applied 04/08/24 0641)  iohexol (OMNIPAQUE) 350 MG/ML injection 100 mL (has no administration in time range)  acetaminophen  (TYLENOL ) tablet 1,000 mg (1,000 mg Oral Given 04/08/24 0641)  morphine  (PF) 4 MG/ML injection 4 mg (4 mg Intravenous Given 04/08/24 0640)  lactated ringers  bolus 1,000 mL (1,000 mLs Intravenous New Bag/Given 04/08/24 0647)  ondansetron  (ZOFRAN ) injection 4 mg (4 mg Intravenous Given 04/08/24 0646)     IMPRESSION / MDM / ASSESSMENT  AND PLAN / ED COURSE  I reviewed the triage vital signs and the nursing notes.                              Differential diagnosis includes, but is not limited to, nephrolithiasis, UTI, musculoskeletal pain, strain, colitis, diverticulitis, also considered possible dissection given that she is saying the pain radiates from the back to the front and is sharp, also notes some tingling to her left lower extremity.  No midline spinal tenderness, no new focal deficits, no saddle anesthesia or incontinence to suggest cauda equina or spinal cord impingement.  Did also considered possible sciatica.  Labs, UA, CT were obtained at triage.  There are some fluids, Tylenol , Lidoderm  patch, IV morphine   Patient's presentation is most consistent with acute presentation with potential threat to life or bodily function.  Independent interpretation of labs and imaging below.  CT showed mild left hydro nephrosis with stranding, could be a stone that is passed, on assessment, she still having a lot of pain.  UA is not consistent with UTI.  Labs are notable for an AKI.  She is getting fluids.  Will proceed with CT angio to assess for aortic syndrome or vascular etiology.  Patient signed out pending CT angio.  The patient is on the cardiac monitor to evaluate for evidence of arrhythmia and/or significant heart rate changes.   Clinical Course as of 04/08/24 0710  Sat Apr 08, 2024  0559 CT Renal Stone Study IMPRESSION: 1. Mild left hydroureteronephrosis with periureteral/peripelvic stranding, no urinary calculi. 2. Imaging findings are compatible with recently passed left ureteral stone versus acute left pyelonephritis.   [TT]  0600 Independent review of labs, he does have an AKI, electrolytes not severely deranged, UA is not consistent with UTI, no leukocytosis. [TT]  0701 L back and abd pain, tingling to L lower ext. Does have a h/o sciatica and had an epidural injection yesterday.   CT stone with mild hydro w/  stranding.  Ongoing significant pain, given pain medication, CTA pending to eval for dissection.  Do not feel she needs emergent MRI, no red flags for compression syndrome.  [SM]    Clinical Course User Index [SM] Suzanne Kirsch, MD [TT] Waymond Lorelle Cummins, MD     FINAL CLINICAL IMPRESSION(S) / ED DIAGNOSES   Final diagnoses:  Flank pain  Left lower quadrant abdominal pain  Acute left-sided low back pain without sciatica  Constipation, unspecified  constipation type     Rx / DC Orders   ED Discharge Orders          Ordered    sodium phosphate  (FLEET) ENEM  Daily PRN        04/08/24 9290             Note:  This document was prepared using Dragon voice recognition software and may include unintentional dictation errors.    Waymond Lorelle Cummins, MD 04/08/24 9291    Waymond Lorelle Cummins, MD 04/08/24 [phone]

## 2024-04-08 NOTE — Anesthesia Preprocedure Evaluation (Addendum)
 Anesthesia Evaluation  Patient identified by MRN, date of birth, ID band Patient awake    Reviewed: Allergy & Precautions, H&P , NPO status , Patient's Chart, lab work & pertinent test results  History of Anesthesia Complications (+) PONV and history of anesthetic complications  Airway Mallampati: III  TM Distance: >3 FB Neck ROM: full    Dental  (+) Edentulous Upper   Pulmonary neg pulmonary ROS   Pulmonary exam normal        Cardiovascular negative cardio ROS Normal cardiovascular exam     Neuro/Psych negative neurological ROS  negative psych ROS   GI/Hepatic Neg liver ROS,GERD  ,,  Endo/Other  negative endocrine ROS    Renal/GU Renal diseaseLeft uteral stone     Musculoskeletal   Abdominal  (+) + obese  Peds  Hematology negative hematology ROS (+)   Anesthesia Other Findings Past Medical History: No date: Arthritis     Comment:  left shoulder, neck, lower back  (01/11/2018) No date: Chronic lower back pain No date: Family history of adverse reaction to anesthesia     Comment:  Mother has nausea No date: GERD (gastroesophageal reflux disease) No date: PONV (postoperative nausea and vomiting) No date: Pre-diabetes 03/20/2021: UTI (urinary tract infection)  Past Surgical History: No date: APPENDECTOMY No date: JOINT REPLACEMENT 01/11/2018: TOTAL SHOULDER ARTHROPLASTY; Left 01/11/2018: TOTAL SHOULDER ARTHROPLASTY; Left     Comment:  Procedure: LEFT TOTAL SHOULDER ARTHROPLASTY;  Surgeon:               Sharl Selinda Dover, MD;  Location: [facility] OR;  Service:               Orthopedics;  Laterality: Left;  2.5 hrs 02/08/2020: TOTAL SHOULDER ARTHROPLASTY; Right     Comment:  Procedure: TOTAL SHOULDER ARTHROPLASTY;  Surgeon:               Sharl Selinda Dover, MD;  Location: [facility] OR;  Service:               Orthopedics;  Laterality: Right;  2.5 hrs RNFA  BMI    Body Mass Index: 30.90 kg/m       Reproductive/Obstetrics negative OB ROS                              Anesthesia Physical Anesthesia Plan  ASA: 2  Anesthesia Plan: General   Post-op Pain Management: Tylenol  PO (pre-op)*   Induction: Intravenous  PONV Risk Score and Plan: Dexamethasone , Ondansetron , Midazolam , Treatment may vary due to age or medical condition, Propofol  infusion and TIVA  Airway Management Planned: LMA  Additional Equipment:   Intra-op Plan:   Post-operative Plan: Extubation in OR  Informed Consent: I have reviewed the patients History and Physical, chart, labs and discussed the procedure including the risks, benefits and alternatives for the proposed anesthesia with the patient or authorized representative who has indicated his/her understanding and acceptance.     Dental Advisory Given  Plan Discussed with: Anesthesiologist, CRNA and Surgeon  Anesthesia Plan Comments:          Anesthesia Quick Evaluation

## 2024-04-08 NOTE — ED Notes (Signed)
 Last PO intake, 1200 noon 9/26, yesterday.

## 2024-04-08 NOTE — Anesthesia Procedure Notes (Signed)
 Procedure Name: LMA Insertion Date/Time: 04/08/2024 2:25 PM  Performed by: Tod Handing, CRNAPre-anesthesia Checklist: Patient identified, Patient being monitored, Timeout performed, Emergency Drugs available and Suction available Patient Re-evaluated:Patient Re-evaluated prior to induction Oxygen Delivery Method: Circle system utilized Preoxygenation: Pre-oxygenation with 100% oxygen Induction Type: IV induction Ventilation: Mask ventilation without difficulty LMA: LMA inserted Tube type: Oral Number of attempts: 1 Placement Confirmation: positive ETCO2 and breath sounds checked- equal and bilateral Tube secured with: Tape Dental Injury: Teeth and Oropharynx as per pre-operative assessment

## 2024-04-08 NOTE — ED Notes (Signed)
 Admitting MD at [facility].

## 2024-04-08 NOTE — Op Note (Signed)
 Procedure: 1.  Pelvic exam under anesthesia. 2.  Cystoscopy with left retrograde pyelogram and interpretation. 3.  Cystoscopy and insertion of left double-J stent. 4.  Application of fluoroscopy.  Pre-op diagnosis: Left pelvic mass with ureteral obstruction.  Postop diagnosis: Same.  Surgeon: Dr. Norleen Seltzer.  Anesthesia: General.  Specimen: None.  Drains: 6 French by 24 cm left Contour double-J stent without tether.  EBL: None.  Complications: None.  Indications: The patient is a 63 year old female who to the emergency room today with left flank pain 3 days in duration.  She was found on CT to have a left pelvic sidewall mass with ureteral obstruction and mild hydronephrosis.  It was felt that stenting was indicated for pain relief.  Procedure: She was taken the operating room where general anesthetic was induced.  She had received Rocephin earlier today.  She was placed in lithotomy position and fitted with PAS hose.  A pelvic exam under anesthesia was performed and she was noted to have a small pelvic sidewall mass on the left it was somewhat difficult to palpate and was fixed.  She was then prepped with Hibiclens  and draped in the usual sterile fashion.  Cystoscopy was performed using the 21 [place] scope and 30 degree lens.  Examination really normal urethra.  The bladder wall was smooth and pale without tumors, stones or inflammation.  Ureteral orifices were unremarkable.  The left ureteral orifice was cannulated with a 5 [place] open-ended catheter and Omnipaque was instilled.  The left retrograde pyelogram demonstrated a narrowed area approximately 4 cm proximal to the bladder there was approximately 2 cm in length and appeared consistent with extrinsic compression with mild proximal dilation.  Of note she had retained bladder contrast and contrast in the right renal collecting system from the CT earlier today.  A sensor wire was then advanced through the open-ended catheter and  fluoroscopic guidance to the kidney and the open-ended catheter was removed.  A 6 French by 24 cm contour double-J stent was then advanced over the wire without difficulty to the kidney.  The wire was removed leaving the stent in good position in the kidney and in the bladder.  There was brisk efflux from the stent ports after the wire was removed.  The bladder was drained and the cystoscope was removed.  She was taken down from lithotomy position, her anesthetic was reversed and she was moved to recovery in stable condition.  There were no complications.

## 2024-04-08 NOTE — Assessment & Plan Note (Signed)
 Secondary to obstructive uropathy Baseline serum creatinine 0.68 and on admission it was 1.3 Consult urology for consideration for stent placement IV fluid hydration Repeat renal parameters in a.m.

## 2024-04-08 NOTE — ED Provider Notes (Addendum)
 Care assumed of patient from outgoing provider.  See their note for initial history, exam and plan.  Clinical Course as of 04/08/24 0805  Sat Apr 08, 2024  0559 CT Renal Stone Study IMPRESSION: 1. Mild left hydroureteronephrosis with periureteral/peripelvic stranding, no urinary calculi. 2. Imaging findings are compatible with recently passed left ureteral stone versus acute left pyelonephritis.   [TT]  0600 Independent review of labs, he does have an AKI, electrolytes not severely deranged, UA is not consistent with UTI, no leukocytosis. [TT]  0701 L back and abd pain, tingling to L lower ext. Does have a h/o sciatica and had an epidural injection yesterday.   CT stone with mild hydro w/ stranding.  Ongoing significant pain, given pain medication, CTA pending to eval for dissection.  Do not feel she needs emergent MRI, no red flags for compression syndrome.  [SM]  9195 Dr. Watt thinks it is unlikely to be urothelial tumor and that IR should be able to biopsy.  If pain is unable to be controlled can put a stent in this weekend.  Recommended keeping n.p.o. and urology will come to evaluate today. [SM]    Clinical Course User Index [SM] Suzanne Kirsch, MD [TT] Waymond Lorelle Cummins, MD   CT scan concerning for possible neoplasm that is obstructing the left ureter.  Discussed with urology Dr. Watt, sent a secure chat, he will review the images.  Will admit to the hospitalist for ongoing pain control and further workup for left neoplasm.    Suzanne Kirsch, MD 04/08/24 9246    Suzanne Kirsch, MD 04/08/24 [phone]

## 2024-04-08 NOTE — Anesthesia Postprocedure Evaluation (Signed)
 Anesthesia Post Note  Patient: Jazzmyne Rasnick  Procedure(s) Performed: CYSTOSCOPY, WITH RETROGRADE PYELOGRAM AND URETERAL STENT INSERTION (Left: Ureter)  Patient location during evaluation: PACU Anesthesia Type: General Level of consciousness: awake and alert Pain management: pain level controlled Vital Signs Assessment: post-procedure vital signs reviewed and stable Respiratory status: spontaneous breathing, nonlabored ventilation and respiratory function stable Cardiovascular status: blood pressure returned to baseline and stable Postop Assessment: no apparent nausea or vomiting Anesthetic complications: no   No notable events documented.   Last Vitals:  Vitals:   04/08/24 1624 04/08/24 1837  BP: (!) 164/77 128/69  Pulse: (!) 59 (!) 59  Resp: 18 16  Temp: 36.8 C 37.2 C  SpO2: 96% 94%    Last Pain:  Vitals:   04/08/24 1837  TempSrc: Oral  PainSc:                  Camellia Merilee Louder

## 2024-04-08 NOTE — ED Notes (Signed)
 Husband taking valuables/ belongings.

## 2024-04-08 NOTE — Assessment & Plan Note (Signed)
 Patient presented to the ER for evaluation of left flank pain Imaging showed an obstructing 5.9 x 4.5 cm enhancing mass along the left pelvic sidewall causing left hydroureter, hydronephrosis, delayed nephrogram, and nephromegaly; mass abuts the left adnexa with loss of the intervening fat plane. This is highly concerning for malignant neoplasm. Etiology indeterminate. It's possible that this could be arising from a primary urothelial neoplasm of the left ureter.  Other primary neoplasms or metastatic disease not excluded. Correlation with tissue sampling is advised. Urology has been consulted for further recommendation as patient will likely benefit from stent placement due to her AKI Keep n.p.o. for now Pain control

## 2024-04-08 NOTE — ED Notes (Addendum)
 Sleeping, resting, NAD, calm, no changes.

## 2024-04-08 NOTE — ED Triage Notes (Addendum)
 POV from home for left flank pain radiaitng into left lower abdomen x 3 days and constipation since Sunday. Denies dysuria or hematuria.

## 2024-04-08 NOTE — Discharge Instructions (Addendum)
 Please make sure to follow-up with your primary care doctor for further management of your constipation and your symptoms.  I have prescribed you several Fleet enemas for you to use for constipation.   The ureteral stent will likely need to remain in place until the pelvic mass is biopsied and treatment is initiated.  Depending on the clinical findings, you may need the stent long enough to have it replaced with a fresh stent in 3 months.

## 2024-04-08 NOTE — ED Notes (Signed)
 Pt alert, NAD, calm, interactive, taken to CT by stretcher with rad staff

## 2024-04-08 NOTE — H&P (Addendum)
 History and Physical    Patient: Stacey [name] FMW:[id number] DOB: 06-08-1961 DOA: 04/08/2024 DOS: the patient was seen and examined on 04/08/2024 PCP: [name], PA-C  Patient coming from: Home  Chief Complaint:  Chief Complaint  Patient presents with   Flank Pain   Constipation   HPI: Stacey [name] is a 63 y.o. female with medical history significant for GERD, chronic lower back pain, prediabetes who presents to the ER for evaluation of a 3-day history of left flank pain that has progressively worsened prompting her visit to the ER. Patient states that when the pain started she initially thought it was worsening of her low back pain and followed up with her orthopedic surgeon where she received a steroid shot without any significant improvement in her pain. She rates her pain a 10 x 10 in intensity at its worst and it is usually worse with any form of movement.  She denies having any nausea, no vomiting, no changes in her bowel habits, no hematuria, no dysuria, no frequency, no changes in her bowel habits, no fever, no cough, no chills, no blood pressure no focal deficit. Abnormal labs include a creatinine of 1.3 compared to baseline of 0.68 from 2021 Renal stone CT showed mild left hydroureteronephrosis with periureteral/peripelvic stranding, no urinary calculi. Imaging findings are compatible with recently passed left ureteral stone versus acute left pyelonephritis. CT angiogram of the abdomen and pelvis showed obstructing 5.9 x 4.5 cm enhancing mass along the left pelvic sidewall causing left hydroureter, hydronephrosis, delayed nephrogram, and nephromegaly; mass abuts the left adnexa with loss of the intervening fat plane. This is highly concerning for malignant neoplasm. Etiology indeterminate. It's possible that this could be arising from a primary urothelial neoplasm of the left ureter. Other primary neoplasms or metastatic disease not excluded. Correlation with tissue  sampling is advised. Patent abdominal vasculature without signs of mesenteric ischemia. Patient received several doses of IV morphine  in the ER Urology was consulted and patient will be referred to observation status for further evaluation.   Review of Systems: As mentioned in the history of present illness. All other systems reviewed and are negative. Past Medical History:  Diagnosis Date   Arthritis    left shoulder, neck, lower back  (01/11/2018)   Chronic lower back pain    Family history of adverse reaction to anesthesia    Mother has nausea   GERD (gastroesophageal reflux disease)    PONV (postoperative nausea and vomiting)    Pre-diabetes    UTI (urinary tract infection) 03/20/2021   Past Surgical History:  Procedure Laterality Date   APPENDECTOMY     JOINT REPLACEMENT     TOTAL SHOULDER ARTHROPLASTY Left 01/11/2018   TOTAL SHOULDER ARTHROPLASTY Left 01/11/2018   Procedure: LEFT TOTAL SHOULDER ARTHROPLASTY;  Surgeon: Sharl Selinda Dover, MD;  Location: [facility] OR;  Service: Orthopedics;  Laterality: Left;  2.5 hrs   TOTAL SHOULDER ARTHROPLASTY Right 02/08/2020   Procedure: TOTAL SHOULDER ARTHROPLASTY;  Surgeon: Sharl Selinda Dover, MD;  Location: [facility] OR;  Service: Orthopedics;  Laterality: Right;  2.5 hrs RNFA   Social History:  reports that she has never smoked. She has never used smokeless tobacco. She reports current alcohol use. She reports that she does not use drugs.  Allergies  Allergen Reactions   Propofol  Nausea Only    Family History  Problem Relation Age of Onset   Hypertension Mother    Heart disease Father    Heart attack Father    CAD  Father    Colon cancer Neg Hx    Esophageal cancer Neg Hx    Rectal cancer Neg Hx    Stomach cancer Neg Hx     Prior to Admission medications   Medication Sig Start Date End Date Taking? Authorizing Provider  gabapentin  (NEURONTIN ) 100 MG capsule Take 100 mg by mouth at bedtime.   Yes [provider]   methocarbamol  (ROBAXIN ) 500 MG tablet Take 500 mg by mouth every 8 (eight) hours as needed for muscle spasms. 10/15/17  Yes [provider]  omeprazole  (PRILOSEC) 20 MG capsule TAKE 1 CAPSULE (20 MG TOTAL) BY MOUTH 2 (TWO) TIMES DAILY BEFORE A MEAL. 01/11/24  Yes Danis, Victory CROME III, MD  predniSONE  (DELTASONE ) 20 MG tablet Take 1 tablet twice daily for 5 days, then 1 tablet once daily for 5 days. Take with food. 03/30/24  Yes [provider]  sodium phosphate  (FLEET) ENEM Place 133 mLs (1 enema total) rectally daily as needed for severe constipation. 04/08/24  Yes Waymond Lorelle Cummins, MD  traMADol  (ULTRAM ) 50 MG tablet Take 50 mg by mouth every 6 (six) hours as needed. 03/16/24  Yes [provider]  HYDROcodone -acetaminophen  (NORCO/VICODIN) 5-325 MG tablet Take 1 tablet by mouth every 6 (six) hours as needed. Patient not taking: Reported on 04/08/2024 03/30/24   [provider]  meclizine  (ANTIVERT ) 25 MG tablet Take 1 tablet (25 mg total) by mouth 3 (three) times daily as needed for dizziness. Patient not taking: Reported on 04/08/2024 04/05/20   Claudene Rover, MD  predniSONE  (STERAPRED UNI-PAK 21 TAB) 10 MG (21) TBPK tablet Take 6 tablets on the first day and decrease by 1 tablet each day until finished. Patient not taking: Reported on 04/08/2024 03/10/24   Herlinda Kirk NOVAK, FNP    Physical Exam: Vitals:   04/08/24 1505 04/08/24 1515 04/08/24 1530 04/08/24 1545  BP: 115/61 123/66 137/74   Pulse: 64 61 (!) 58 66  Resp: 11 11 14 15   Temp:      TempSrc:      SpO2: 100% 100% 100% 100%  Weight:      Height:       Physical Exam Vitals and nursing note reviewed.  Constitutional:      Appearance: Normal appearance.     Comments: Appears uncomfortable  HENT:     Head: Normocephalic and atraumatic.     Nose: Nose normal.     Mouth/Throat:     Mouth: Mucous membranes are moist.  Eyes:     Conjunctiva/sclera: Conjunctivae normal.  Cardiovascular:     Rate and Rhythm: Normal  rate and regular rhythm.  Pulmonary:     Effort: Pulmonary effort is normal.     Breath sounds: Normal breath sounds.  Abdominal:     General: Bowel sounds are normal.     Palpations: Abdomen is soft.     Comments: Left flank pain  Musculoskeletal:        General: Normal range of motion.  Skin:    General: Skin is warm and dry.  Neurological:     General: No focal deficit present.     Mental Status: She is alert and oriented to person, place, and time.  Psychiatric:        Mood and Affect: Mood normal.        Behavior: Behavior normal.     Data Reviewed: Data Reviewed: Relevant notes from primary care and specialist visits, past discharge summaries as available in EHR, including Care Everywhere.  Prior diagnostic testing as pertinent to current admission diagnoses Updated medications and problem lists for reconciliation ED course, including vitals, labs, imaging, treatment and response to treatment Triage notes, nursing and pharmacy notes and ED provider's notes Notable results as noted in HPI White count 10.5, hemoglobin 13.6, hematocrit 40.2, sodium 137, potassium 4.7, chloride 102, bicarb 26, glucose 112, BUN 21, creatinine 1.3, calcium 8.7, lactic acid 1.3, total protein 7.6, albumin 3.5, AST 19, ALT 14, alkaline phosphatase 68, total bilirubin 0.7  Labs reviewed  Assessment and Plan:  Hydronephrosis left kidney Patient presents to the ER for evaluation of left flank pain Renal stone CT showed mild left hydroureteronephrosis. Hazy stranding about the left renal pelvis and proximal left ureter. No urinary calculi. CT angiogram showed obstructing 5.9 x 4.5 cm enhancing mass along the left pelvic sidewall causing left hydroureter, hydronephrosis, delayed nephrogram, and nephromegaly; mass abuts the left adnexa with loss of the intervening fat plane. This is highly concerning for malignant neoplasm. Urologic consult for consideration for stent placement Pain  control   AKI Secondary to obstructive uropathy Patient has a baseline serum creatinine of 0.6 and today on admission it was 1.3 Expect improvement following stent placement Continue IV fluid hydration Repeat renal parameters in a.m.      Advance Care Planning:   Code Status: Prior full code  Consults: Urology  Family Communication: Plan of care discussed with patient and her husband at the bedside.  They verbalized understanding and agreed with the plan.  Severity of Illness: The appropriate patient status for this patient is INPATIENT. Inpatient status is judged to be reasonable and necessary in order to provide the required intensity of service to ensure the patient's safety. The patient's presenting symptoms, physical exam findings, and initial radiographic and laboratory data in the context of their chronic comorbidities is felt to place them at high risk for further clinical deterioration. Furthermore, it is not anticipated that the patient will be medically stable for discharge from the hospital within 2 midnights of admission.   * I certify that at the point of admission it is my clinical judgment that the patient will require inpatient hospital care spanning beyond 2 midnights from the point of admission due to high intensity of service, high risk for further deterioration and high frequency of surveillance required.*  Author: Aimee Somerset, MD 04/08/2024 3:48 PM  For on call review [email or web address].

## 2024-04-08 NOTE — Consult Note (Signed)
 Subjective:    Consult requested by Dr. Clotilda Punter  Stacey [name] is a 63 yo female who presented to the ER with a 3 day history of left flank pain that became severe and was assocated with nausea.   She was found on CT to have a left pelvic side wall mass with ureteral obstruction and mild hydro.  Her Cr is 1.33.   She has no fever or leukocytosis and her UA is clear.   Her pain is not fully controlled with medical therapy.   She had some LLE sciatic and had an epidural injection by Dr. Bonner about a week ago and got relief of the leg pain.  She has no voiding complaints or pelvic pain.  She has chronic constipation from her pain control regimen.   She has had  prior UTI's but no GU or Gyn surgery.    ROS:  Review of Systems  Gastrointestinal:  Positive for constipation.  Genitourinary:  Positive for flank pain.  All other systems reviewed and are negative.   Allergies  Allergen Reactions   Propofol  Nausea Only    Past Medical History:  Diagnosis Date   Arthritis    left shoulder, neck, lower back  (01/11/2018)   Chronic lower back pain    Family history of adverse reaction to anesthesia    Mother has nausea   GERD (gastroesophageal reflux disease)    PONV (postoperative nausea and vomiting)    Pre-diabetes    UTI (urinary tract infection) 03/20/2021    Past Surgical History:  Procedure Laterality Date   APPENDECTOMY     JOINT REPLACEMENT     TOTAL SHOULDER ARTHROPLASTY Left 01/11/2018   TOTAL SHOULDER ARTHROPLASTY Left 01/11/2018   Procedure: LEFT TOTAL SHOULDER ARTHROPLASTY;  Surgeon: Sharl Selinda Dover, MD;  Location: [facility] OR;  Service: Orthopedics;  Laterality: Left;  2.5 hrs   TOTAL SHOULDER ARTHROPLASTY Right 02/08/2020   Procedure: TOTAL SHOULDER ARTHROPLASTY;  Surgeon: Sharl Selinda Dover, MD;  Location: [facility] OR;  Service: Orthopedics;  Laterality: Right;  2.5 hrs RNFA    Social History   Socioeconomic History   Marital status: Married    Spouse name: Not on file    Number of children: Not on file   Years of education: Not on file   Highest education level: Not on file  Occupational History   Not on file  Tobacco Use   Smoking status: Never   Smokeless tobacco: Never  Vaping Use   Vaping status: Never Used  Substance and Sexual Activity   Alcohol use: Yes    Comment: 01/11/2018 might have 1 drink/month   Drug use: Never   Sexual activity: Not on file  Other Topics Concern   Not on file  Social History Narrative   Not on file   Social Drivers of Health   Financial Resource Strain: Low Risk  (03/30/2024)   Received from [facility]   Overall Financial Resource Strain (CARDIA)    Difficulty of Paying Living Expenses: Not hard at all  Food Insecurity: Unknown (03/30/2024)   Received from [facility]   Hunger Vital Sign    Within the past 12 months, you worried that your food would run out before you got the money to buy more.: Never true    Ran Out of Food in the Last Year: Not on file  Transportation Needs: No Transportation Needs (03/30/2024)   Received from [facility]   [facility] - Transportation  In the past 12 months, has lack of transportation kept you from medical appointments or from getting medications?: No    Lack of Transportation (Non-Medical): No  Physical Activity: Not on file  Stress: Not on file  Social Connections: Not on file  Intimate Partner Violence: Not on file    Family History  Problem Relation Age of Onset   Hypertension Mother    Heart disease Father    Heart attack Father    CAD Father    Colon cancer Neg Hx    Esophageal cancer Neg Hx    Rectal cancer Neg Hx    Stomach cancer Neg Hx     Anti-infectives: Anti-infectives (From admission, onward)    Start     Dose/Rate Route Frequency Ordered Stop   04/08/24 1000  cefTRIAXone (ROCEPHIN) 1 g in sodium chloride  0.9 % 100 mL IVPB        1 g 200 mL/hr over 30 Minutes Intravenous  Once 04/08/24 0928  04/08/24 1008       Current Facility-Administered Medications  Medication Dose Route Frequency Provider Last Rate Last Admin   0.9 %  sodium chloride  infusion   Intravenous Continuous Agbata, Tochukwu, MD 100 mL/hr at 04/08/24 0954 New Bag at 04/08/24 0954   lidocaine  (LIDODERM ) 5 % 1 patch  1 patch Transdermal Q24H Tan, Ting Xu, MD   1 patch at 04/08/24 9358   morphine  (PF) 4 MG/ML injection 4 mg  4 mg Intravenous Q4H PRN Agbata, Tochukwu, MD       ondansetron  (ZOFRAN ) injection 4 mg  4 mg Intravenous Q6H PRN Agbata, Tochukwu, MD       Current Outpatient Medications  Medication Sig Dispense Refill   gabapentin  (NEURONTIN ) 100 MG capsule Take 100 mg by mouth at bedtime.     methocarbamol  (ROBAXIN ) 500 MG tablet Take 500 mg by mouth every 8 (eight) hours as needed for muscle spasms.  0   omeprazole  (PRILOSEC) 20 MG capsule TAKE 1 CAPSULE (20 MG TOTAL) BY MOUTH 2 (TWO) TIMES DAILY BEFORE A MEAL. 180 capsule 0   predniSONE  (DELTASONE ) 20 MG tablet Take 1 tablet twice daily for 5 days, then 1 tablet once daily for 5 days. Take with food.     sodium phosphate  (FLEET) ENEM Place 133 mLs (1 enema total) rectally daily as needed for severe constipation. 3 mL 0   traMADol  (ULTRAM ) 50 MG tablet Take 50 mg by mouth every 6 (six) hours as needed.     HYDROcodone -acetaminophen  (NORCO/VICODIN) 5-325 MG tablet Take 1 tablet by mouth every 6 (six) hours as needed. (Patient not taking: Reported on 04/08/2024)     meclizine  (ANTIVERT ) 25 MG tablet Take 1 tablet (25 mg total) by mouth 3 (three) times daily as needed for dizziness. (Patient not taking: Reported on 04/08/2024) 30 tablet 0   predniSONE  (STERAPRED UNI-PAK 21 TAB) 10 MG (21) TBPK tablet Take 6 tablets on the first day and decrease by 1 tablet each day until finished. (Patient not taking: Reported on 04/08/2024) 21 tablet 0     Objective: Vital signs in last 24 hours: BP 133/73   Pulse 64   Temp 98.9 F (37.2 C) (Oral)   Resp 18   Ht 5' 4  (1.626 m)   Wt 81.6 kg   SpO2 98%   BMI 30.90 kg/m   Intake/Output from previous day: No intake/output data recorded. Intake/Output this shift: Total I/O In: 1000 [IV Piggyback:1000] Out: -    Physical Exam Vitals reviewed.  Constitutional:      Appearance: Normal appearance.  Cardiovascular:     Rate and Rhythm: Normal rate and regular rhythm.     Heart sounds: Normal heart sounds.  Pulmonary:     Effort: Pulmonary effort is normal.     Breath sounds: Normal breath sounds.  Abdominal:     General: Abdomen is flat.     Palpations: Abdomen is soft.     Tenderness: There is left CVA tenderness.  Genitourinary:    Comments: I will do an exam under anesthesia.  Musculoskeletal:        General: No swelling or tenderness. Normal range of motion.  Skin:    General: Skin is warm and dry.  Neurological:     General: No focal deficit present.     Mental Status: She is alert and oriented to person, place, and time.  Psychiatric:        Mood and Affect: Mood normal.        Behavior: Behavior normal.     Lab Results:  Results for orders placed or performed during the hospital encounter of 04/08/24 (from the past 24 hours)  Urinalysis, Routine w reflex microscopic -Urine, Clean Catch     Status: Abnormal   Collection Time: 04/08/24  2:00 AM  Result Value Ref Range   Color, Urine YELLOW (A) YELLOW   APPearance CLEAR (A) CLEAR   Specific Gravity, Urine 1.019 1.005 - 1.030   pH 7.0 5.0 - 8.0   Glucose, UA NEGATIVE NEGATIVE mg/dL   Hgb urine dipstick NEGATIVE NEGATIVE   Bilirubin Urine NEGATIVE NEGATIVE   Ketones, ur NEGATIVE NEGATIVE mg/dL   Protein, ur NEGATIVE NEGATIVE mg/dL   Nitrite NEGATIVE NEGATIVE   Leukocytes,Ua NEGATIVE NEGATIVE  Basic metabolic panel     Status: Abnormal   Collection Time: 04/08/24  2:00 AM  Result Value Ref Range   Sodium 137 135 - 145 mmol/L   Potassium 4.7 3.5 - 5.1 mmol/L   Chloride 102 98 - 111 mmol/L   CO2 26 22 - 32 mmol/L   Glucose,  Bld 112 (H) 70 - 99 mg/dL   BUN 21 8 - 23 mg/dL   Creatinine, Ser 8.66 (H) 0.44 - 1.00 mg/dL   Calcium 8.7 (L) 8.9 - 10.3 mg/dL   GFR, Estimated 46 (L) >60 mL/min   Anion gap 9 5 - 15  CBC     Status: None   Collection Time: 04/08/24  2:00 AM  Result Value Ref Range   WBC 10.5 4.0 - 10.5 K/uL   RBC 4.33 3.87 - 5.11 MIL/uL   Hemoglobin 13.6 12.0 - 15.0 g/dL   HCT 59.7 63.9 - 53.9 %   MCV 92.8 80.0 - 100.0 fL   MCH 31.4 26.0 - 34.0 pg   MCHC 33.8 30.0 - 36.0 g/dL   RDW 86.7 88.4 - 84.4 %   Platelets 308 150 - 400 K/uL   nRBC 0.0 0.0 - 0.2 %  Hepatic function panel     Status: None   Collection Time: 04/08/24  2:00 AM  Result Value Ref Range   Total Protein 7.6 6.5 - 8.1 g/dL   Albumin 3.5 3.5 - 5.0 g/dL   AST 19 15 - 41 U/L   ALT 14 0 - 44 U/L   Alkaline Phosphatase 68 38 - 126 U/L   Total Bilirubin 0.7 0.0 - 1.2 mg/dL   Bilirubin, Direct 0.2 0.0 - 0.2 mg/dL   Indirect Bilirubin 0.5 0.3 - 0.9 mg/dL  Lactic acid, plasma     Status: None   Collection Time: 04/08/24  6:50 AM  Result Value Ref Range   Lactic Acid, Venous 1.3 0.5 - 1.9 mmol/L    BMET Recent Labs    04/08/24 0200  NA 137  K 4.7  CL 102  CO2 26  GLUCOSE 112*  BUN 21  CREATININE 1.33*  CALCIUM 8.7*   PT/INR No results for input(s): LABPROT, INR in the last 72 hours. ABG No results for input(s): PHART, HCO3 in the last 72 hours.  Invalid input(s): PCO2, PO2  Studies/Results: CT Angio Abd/Pel W and/or Wo Contrast Result Date: 04/08/2024 EXAM: CTA ABDOMEN AND PELVIS WITH AND WITHOUT CONTRAST 04/08/2024 07:28:48 AM TECHNIQUE: CTA images of the abdomen and pelvis without and with intravenous contrast. Three-dimensional MIP/volume rendered formations were performed. Automated exposure control, iterative reconstruction, and/or weight based adjustment of the mA/kV was utilized to reduce the radiation dose to as low as reasonably achievable. 100 mL iohexol (OMNIPAQUE) 350 MG/ML injection was  administered. COMPARISON: CT from earlier today. CLINICAL HISTORY: Mesenteric ischemia, acute; left back, flank LLQ pain, assess for AAS. Left flank pain radiating to stomach. Constipation. FINDINGS: VASCULATURE: No acute finding. No occlusion or significant stenosis. AORTA: No acute finding. No abdominal aortic aneurysm. No dissection. CELIAC TRUNK: No acute finding. No occlusion or significant stenosis. SUPERIOR MESENTERIC ARTERY: No acute finding. No occlusion or significant stenosis. RENAL ARTERIES: No acute finding. No occlusion or significant stenosis. ILIAC ARTERIES: No acute finding. No occlusion or significant stenosis. LIVER: The liver is unremarkable. GALLBLADDER AND BILE DUCTS: Gallbladder is unremarkable. No biliary ductal dilatation. SPLEEN: The spleen is unremarkable. PANCREAS: The pancreas is unremarkable. ADRENAL GLANDS: Bilateral adrenal glands demonstrate no acute abnormality. KIDNEYS, URETERS AND BLADDER: Left-sided nephromegaly, hydronephrosis with delayed nephrograms. Left-sided hydroureter extends into the pelvis and is obstructed distally by a left pelvic sidewall enhancing mass. The mass measures 5.9 x 4.5 cm (image 164/9). The mass abuts the left adnexa (image 166/9) with loss of a fat plane between these 2 structures. No stones in the kidneys or ureters. No perinephric or periureteral stranding. Urinary bladder is unremarkable. GI AND BOWEL: Stomach appears nondilated. No pathologic dilatation of the large or small bowel loops. Moderate retained stool within the ascending colon. There are no secondary signs of bowel ischemia. REPRODUCTIVE: Uterus appears normal. Right adnexa is within normal limits. The left adnexa is abutts the left pelvic sidewall enhancing mass as described under KIDNEYS, URETERS AND BLADDER. PERITONEUM AND RETRPERITONEUM: No ascites or free air. No focal fluid collections. LUNG BASE: No acute abnormality. LYMPH NODES: No lymphadenopathy. BONES AND SOFT TISSUES: No  acute abnormality of the bones. No aggressive lytic or sclerotic bone lesion. No acute soft tissue abnormality. IMPRESSION: 1. Obstructing 5.9 x 4.5 cm enhancing mass along the left pelvic sidewall causing left hydroureter, hydronephrosis, delayed nephrogram, and nephromegaly; mass abuts the left adnexa with loss of the intervening fat plane. This is highly concerning for malignant neoplasm. Etiology indeterminate. It's possible that this could be arising from a primary urothelial neoplasm of the left ureter. Other primary neoplasms or metastatic disease not excluded. Correlation with tissue sampling is advised. 2. Patent abdominal vasculature without signs of mesenteric ischemia. Electronically signed by: Waddell Calk MD 04/08/2024 07:40 AM EDT RP Workstation: [id number]   CT Renal Stone Study Result Date: 04/08/2024 EXAM: CT UROGRAM 04/08/2024 02:22:00 AM TECHNIQUE: CT of the abdomen and pelvis was performed without the administration of intravenous contrast as per CT urogram protocol.  Multiplanar reformatted images as well as MIP urogram images are provided for review. Automated exposure control, iterative reconstruction, and/or weight based adjustment of the mA/kV was utilized to reduce the radiation dose to as low as reasonably achievable. COMPARISON: None available. CLINICAL HISTORY: Abdominal/flank pain, stone suspected. POV from home for left flank pain radiating into left lower abdomen x 3 days and constipation since Sunday. Denies dysuria or hematuria. FINDINGS: LOWER CHEST: No acute abnormality. LIVER: The liver is unremarkable. GALLBLADDER AND BILE DUCTS: Gallbladder is unremarkable. No biliary ductal dilatation. SPLEEN: No acute abnormality. PANCREAS: No acute abnormality. ADRENAL GLANDS: No acute abnormality. KIDNEYS, URETERS AND BLADDER: Mild left hydroureteronephrosis. Hazy stranding about the left renal pelvis and proximal left ureter. No urinary calculi. Differential considerations include  recently passed left stone or left pyelonephritis. Urinary bladder is unremarkable. GI AND BOWEL: Stomach demonstrates no acute abnormality. There is no bowel obstruction. PERITONEUM AND RETROPERITONEUM: No ascites. No free air. VASCULATURE: Aorta is normal in caliber. LYMPH NODES: No lymphadenopathy. REPRODUCTIVE ORGANS: No acute abnormality. BONES AND SOFT TISSUES: No acute osseous abnormality. No focal soft tissue abnormality. IMPRESSION: 1. Mild left hydroureteronephrosis with periureteral/peripelvic stranding, no urinary calculi. 2. Imaging findings are compatible with recently passed left ureteral stone versus acute left pyelonephritis. Electronically signed by: Norman Gatlin MD 04/08/2024 03:20 AM EDT RP Workstation: [id number]   Labs and imaging reviewed.  Case discussed with Dr. Suzanne.   Assessment/Plan:   1 - Left ureteral obstruction from a pelvic side wall mass with poorly controlled pain.  I will take her to the OR today for cystoscopy with left retrograde and stent insertion and I have reviewed the risks of bleeding, infection, injury to the ureter, need for secondary procedures, thrombotic events and anesthetic complications.    She will need IR to biopsy the mass.   2 - AKI.      No follow-ups on file.    CC: Dr. Clotilda Mumma.     Stacey [name] 04/08/2024

## 2024-04-08 NOTE — ED Notes (Signed)
 Alert, NAD, calm, relaxed, resting. Endorses pain and nausea. Family at [facility].

## 2024-04-08 NOTE — ED Notes (Signed)
 Pt ambulatory to restroom with steady gate. Pt returned to bed, placed on cardiac monitor without incident. No further needs expressed. CB within reach.

## 2024-04-09 ENCOUNTER — Encounter: Payer: Self-pay | Admitting: Urology

## 2024-04-09 DIAGNOSIS — N1330 Unspecified hydronephrosis: Secondary | ICD-10-CM | POA: Diagnosis not present

## 2024-04-09 LAB — BASIC METABOLIC PANEL WITH GFR
Anion gap: 7 (ref 5–15)
BUN: 17 mg/dL (ref 8–23)
CO2: 25 mmol/L (ref 22–32)
Calcium: 8.3 mg/dL — ABNORMAL LOW (ref 8.9–10.3)
Chloride: 105 mmol/L (ref 98–111)
Creatinine, Ser: 0.7 mg/dL (ref 0.44–1.00)
GFR, Estimated: >60 mL/min (ref >60)
Glucose, Bld: 160 mg/dL — ABNORMAL HIGH (ref 70–99)
Potassium: 4.7 mmol/L (ref 3.5–5.1)
Sodium: 137 mmol/L (ref 135–145)

## 2024-04-09 LAB — CBC
HCT: 36.7 % (ref 36.0–46.0)
Hemoglobin: 12.3 g/dL (ref 12.0–15.0)
MCH: 31.1 pg (ref 26.0–34.0)
MCHC: 33.5 g/dL (ref 30.0–36.0)
MCV: 92.7 fL (ref 80.0–100.0)
Platelets: 237 K/uL (ref 150–400)
RBC: 3.96 MIL/uL (ref 3.87–5.11)
RDW: 13.3 % (ref 11.5–15.5)
WBC: 8.3 K/uL (ref 4.0–10.5)
nRBC: 0 % (ref 0.0–0.2)

## 2024-04-09 LAB — HIV ANTIBODY (ROUTINE TESTING W REFLEX): HIV Screen 4th Generation wRfx: NONREACTIVE

## 2024-04-09 LAB — PROTIME-INR
INR: 1 (ref 0.8–1.2)
Prothrombin Time: 13.6 s (ref 11.4–15.2)

## 2024-04-09 MED ORDER — SENNOSIDES-DOCUSATE SODIUM 8.6-50 MG PO TABS
2.0000 | ORAL_TABLET | Freq: Every day | ORAL | Status: DC
  Administered 2024-04-09: 2 via ORAL
  Filled 2024-04-09: qty 2

## 2024-04-09 NOTE — Consult Note (Signed)
 Chief Complaint: Patient was seen in consultation today for left pelvic mass with left ureteral obstruction, with consideration for biopsy.  Referring Provider(s): Dr. Aimee Somerset    Supervising Physician: Karalee Beat  Patient Status: [facility] - In-pt  Patient is Full Code  History of Present Illness: Stacey [name] is a 63 y.o. female with a history of chronic lower back pain who presented to the ED on 9/27 with complaints of progressively worsening left flank pain that radiates to her left lower quadrant for the past 3 days. Per notes, patient is not reporting any urinary symptoms, but has not been able to have a bowel movement for the past several days despite taking Miralax. ED workup significant for Cr 1.33 and imaging concerning for an obstructing 5.9 x 4.5cm enhancing mass along the left pelvic sidewall causing left hydroureter, hydronephrosis, and nephromegaly; highly concerning for malignant neoplasm. Urology consulted for possible stenting which patient underwent on 9/28; per notes she continues to do well; however, Urology recommending a biopsy of the pelvic mass.   Interventional Radiology was requested for pelvic mass biopsy. Request was reviewed and approved by Dr. Hughes. Patient is tentatively scheduled for same in IR today.   Patient is alert and laying in bed, calm. Husband is at the bedside. Patient is currently without any significant complaints.  Patient denies any fevers, headache, chest pain, SOB, cough, abdominal pain, nausea, vomiting or bleeding.    Past Medical History:  Diagnosis Date   Arthritis    left shoulder, neck, lower back  (01/11/2018)   Chronic lower back pain    Family history of adverse reaction to anesthesia    Mother has nausea   GERD (gastroesophageal reflux disease)    PONV (postoperative nausea and vomiting)    Pre-diabetes    UTI (urinary tract infection) 03/20/2021    Past Surgical History:  Procedure Laterality Date    APPENDECTOMY     CYSTOSCOPY W/ URETERAL STENT PLACEMENT Left 04/08/2024   Procedure: CYSTOSCOPY, WITH RETROGRADE PYELOGRAM AND URETERAL STENT INSERTION;  Surgeon: Watt Rush, MD;  Location: [facility] ORS;  Service: Urology;  Laterality: Left;   JOINT REPLACEMENT     TOTAL SHOULDER ARTHROPLASTY Left 01/11/2018   TOTAL SHOULDER ARTHROPLASTY Left 01/11/2018   Procedure: LEFT TOTAL SHOULDER ARTHROPLASTY;  Surgeon: Sharl Selinda Dover, MD;  Location: [facility] OR;  Service: Orthopedics;  Laterality: Left;  2.5 hrs   TOTAL SHOULDER ARTHROPLASTY Right 02/08/2020   Procedure: TOTAL SHOULDER ARTHROPLASTY;  Surgeon: Sharl Selinda Dover, MD;  Location: [facility] OR;  Service: Orthopedics;  Laterality: Right;  2.5 hrs RNFA    Allergies: Propofol   Medications: Prior to Admission medications   Medication Sig Start Date End Date Taking? Authorizing Provider  gabapentin  (NEURONTIN ) 100 MG capsule Take 100 mg by mouth at bedtime.   Yes [provider]  methocarbamol  (ROBAXIN ) 500 MG tablet Take 500 mg by mouth every 8 (eight) hours as needed for muscle spasms. 10/15/17  Yes [provider]  omeprazole  (PRILOSEC) 20 MG capsule TAKE 1 CAPSULE (20 MG TOTAL) BY MOUTH 2 (TWO) TIMES DAILY BEFORE A MEAL. 01/11/24  Yes Danis, Victory LITTIE MOULD, MD  predniSONE  (DELTASONE ) 20 MG tablet Take 1 tablet twice daily for 5 days, then 1 tablet once daily for 5 days. Take with food. 03/30/24  Yes [provider]  sodium phosphate  (FLEET) ENEM Place 133 mLs (1 enema total) rectally daily as needed for severe constipation. 04/08/24  Yes Waymond Lorelle Cummins, MD  traMADol  (  ULTRAM ) 50 MG tablet Take 50 mg by mouth every 6 (six) hours as needed. 03/16/24  Yes [provider]  HYDROcodone -acetaminophen  (NORCO/VICODIN) 5-325 MG tablet Take 1 tablet by mouth every 6 (six) hours as needed. Patient not taking: Reported on 04/08/2024 03/30/24   [provider]  meclizine  (ANTIVERT ) 25 MG tablet Take 1 tablet (25 mg total) by  mouth 3 (three) times daily as needed for dizziness. Patient not taking: Reported on 04/08/2024 04/05/20   Claudene Rover, MD  predniSONE  (STERAPRED UNI-PAK 21 TAB) 10 MG (21) TBPK tablet Take 6 tablets on the first day and decrease by 1 tablet each day until finished. Patient not taking: Reported on 04/08/2024 03/10/24   Herlinda Kirk NOVAK, FNP     Family History  Problem Relation Age of Onset   Hypertension Mother    Heart disease Father    Heart attack Father    CAD Father    Colon cancer Neg Hx    Esophageal cancer Neg Hx    Rectal cancer Neg Hx    Stomach cancer Neg Hx     Social History   Socioeconomic History   Marital status: Married    Spouse name: Not on file   Number of children: Not on file   Years of education: Not on file   Highest education level: Not on file  Occupational History   Not on file  Tobacco Use   Smoking status: Never   Smokeless tobacco: Never  Vaping Use   Vaping status: Never Used  Substance and Sexual Activity   Alcohol use: Yes    Comment: 01/11/2018 might have 1 drink/month   Drug use: Never   Sexual activity: Not on file  Other Topics Concern   Not on file  Social History Narrative   Not on file   Social Drivers of Health   Financial Resource Strain: Low Risk  (03/30/2024)   Received from [facility]   Overall Financial Resource Strain (CARDIA)    Difficulty of Paying Living Expenses: Not hard at all  Food Insecurity: No Food Insecurity (04/08/2024)   Hunger Vital Sign    Worried About Running Out of Food in the Last Year: Never true    Ran Out of Food in the Last Year: Never true  Transportation Needs: No Transportation Needs (04/08/2024)   PRAPARE - Administrator, Civil Service (Medical): No    Lack of Transportation (Non-Medical): No  Physical Activity: Not on file  Stress: Not on file  Social Connections: Not on file     Review of Systems: A 12 point ROS discussed and pertinent positives are  indicated in the HPI above.  All other systems are negative.  Vital Signs: BP (P) 137/82 (BP Location: Right Arm)   Pulse (P) 72   Temp (P) 98.8 F (37.1 C) (Oral)   Resp 16   Ht 5' 4 (1.626 m)   Wt 180 lb (81.6 kg)   SpO2 (P) 100%   BMI 30.90 kg/m   Advance Care Plan: The advanced care place/surrogate decision maker was discussed at the time of visit and the patient did not wish to discuss or was not able to name a surrogate decision maker or provide an advance care plan.  Physical Exam Constitutional:      General: She is not in acute distress.    Appearance: Normal appearance.  HENT:     Mouth/Throat:     Mouth: Mucous membranes are dry.  Cardiovascular:     Rate and Rhythm: Normal rate and regular rhythm.     Pulses: Normal pulses.     Heart sounds: Normal heart sounds.  Pulmonary:     Effort: Pulmonary effort is normal.     Breath sounds: Normal breath sounds.  Abdominal:     General: Abdomen is flat. There is no distension.     Palpations: Abdomen is soft.     Tenderness: There is no abdominal tenderness.  Musculoskeletal:        General: Normal range of motion.     Cervical back: Normal range of motion.     Comments: Left sided flank discomfort, positional, from left ureteral stent placement.  Skin:    General: Skin is warm and dry.  Neurological:     Mental Status: She is alert and oriented to person, place, and time.  Psychiatric:        Mood and Affect: Mood normal.        Behavior: Behavior normal.        Thought Content: Thought content normal.        Judgment: Judgment normal.     Imaging: DG OR UROLOGY CYSTO IMAGE ([facility] ONLY) Result Date: 04/08/2024 There is no interpretation for this exam.  This order is for images obtained during a surgical procedure.  Please See Surgeries Tab for more information regarding the procedure.   CT Angio Abd/Pel W and/or Wo Contrast Result Date: 04/08/2024 EXAM: CTA ABDOMEN AND PELVIS WITH AND WITHOUT CONTRAST  04/08/2024 07:28:48 AM TECHNIQUE: CTA images of the abdomen and pelvis without and with intravenous contrast. Three-dimensional MIP/volume rendered formations were performed. Automated exposure control, iterative reconstruction, and/or weight based adjustment of the mA/kV was utilized to reduce the radiation dose to as low as reasonably achievable. 100 mL iohexol (OMNIPAQUE) 350 MG/ML injection was administered. COMPARISON: CT from earlier today. CLINICAL HISTORY: Mesenteric ischemia, acute; left back, flank LLQ pain, assess for AAS. Left flank pain radiating to stomach. Constipation. FINDINGS: VASCULATURE: No acute finding. No occlusion or significant stenosis. AORTA: No acute finding. No abdominal aortic aneurysm. No dissection. CELIAC TRUNK: No acute finding. No occlusion or significant stenosis. SUPERIOR MESENTERIC ARTERY: No acute finding. No occlusion or significant stenosis. RENAL ARTERIES: No acute finding. No occlusion or significant stenosis. ILIAC ARTERIES: No acute finding. No occlusion or significant stenosis. LIVER: The liver is unremarkable. GALLBLADDER AND BILE DUCTS: Gallbladder is unremarkable. No biliary ductal dilatation. SPLEEN: The spleen is unremarkable. PANCREAS: The pancreas is unremarkable. ADRENAL GLANDS: Bilateral adrenal glands demonstrate no acute abnormality. KIDNEYS, URETERS AND BLADDER: Left-sided nephromegaly, hydronephrosis with delayed nephrograms. Left-sided hydroureter extends into the pelvis and is obstructed distally by a left pelvic sidewall enhancing mass. The mass measures 5.9 x 4.5 cm (image 164/9). The mass abuts the left adnexa (image 166/9) with loss of a fat plane between these 2 structures. No stones in the kidneys or ureters. No perinephric or periureteral stranding. Urinary bladder is unremarkable. GI AND BOWEL: Stomach appears nondilated. No pathologic dilatation of the large or small bowel loops. Moderate retained stool within the ascending colon. There are no  secondary signs of bowel ischemia. REPRODUCTIVE: Uterus appears normal. Right adnexa is within normal limits. The left adnexa is abutts the left pelvic sidewall enhancing mass as described under KIDNEYS, URETERS AND BLADDER. PERITONEUM AND RETRPERITONEUM: No ascites or free air. No focal fluid collections. LUNG BASE: No acute abnormality. LYMPH NODES: No lymphadenopathy. BONES AND SOFT TISSUES: No acute abnormality of the bones. No  aggressive lytic or sclerotic bone lesion. No acute soft tissue abnormality. IMPRESSION: 1. Obstructing 5.9 x 4.5 cm enhancing mass along the left pelvic sidewall causing left hydroureter, hydronephrosis, delayed nephrogram, and nephromegaly; mass abuts the left adnexa with loss of the intervening fat plane. This is highly concerning for malignant neoplasm. Etiology indeterminate. It's possible that this could be arising from a primary urothelial neoplasm of the left ureter. Other primary neoplasms or metastatic disease not excluded. Correlation with tissue sampling is advised. 2. Patent abdominal vasculature without signs of mesenteric ischemia. Electronically signed by: Waddell Calk MD 04/08/2024 07:40 AM EDT RP Workstation: [id number]   CT Renal Stone Study Result Date: 04/08/2024 EXAM: CT UROGRAM 04/08/2024 02:22:00 AM TECHNIQUE: CT of the abdomen and pelvis was performed without the administration of intravenous contrast as per CT urogram protocol. Multiplanar reformatted images as well as MIP urogram images are provided for review. Automated exposure control, iterative reconstruction, and/or weight based adjustment of the mA/kV was utilized to reduce the radiation dose to as low as reasonably achievable. COMPARISON: None available. CLINICAL HISTORY: Abdominal/flank pain, stone suspected. POV from home for left flank pain radiating into left lower abdomen x 3 days and constipation since Sunday. Denies dysuria or hematuria. FINDINGS: LOWER CHEST: No acute abnormality. LIVER: The  liver is unremarkable. GALLBLADDER AND BILE DUCTS: Gallbladder is unremarkable. No biliary ductal dilatation. SPLEEN: No acute abnormality. PANCREAS: No acute abnormality. ADRENAL GLANDS: No acute abnormality. KIDNEYS, URETERS AND BLADDER: Mild left hydroureteronephrosis. Hazy stranding about the left renal pelvis and proximal left ureter. No urinary calculi. Differential considerations include recently passed left stone or left pyelonephritis. Urinary bladder is unremarkable. GI AND BOWEL: Stomach demonstrates no acute abnormality. There is no bowel obstruction. PERITONEUM AND RETROPERITONEUM: No ascites. No free air. VASCULATURE: Aorta is normal in caliber. LYMPH NODES: No lymphadenopathy. REPRODUCTIVE ORGANS: No acute abnormality. BONES AND SOFT TISSUES: No acute osseous abnormality. No focal soft tissue abnormality. IMPRESSION: 1. Mild left hydroureteronephrosis with periureteral/peripelvic stranding, no urinary calculi. 2. Imaging findings are compatible with recently passed left ureteral stone versus acute left pyelonephritis. Electronically signed by: Norman Gatlin MD 04/08/2024 03:20 AM EDT RP Workstation: [id number]   US  Venous Img Lower Unilateral Left Result Date: 03/30/2024 CLINICAL DATA:  Left lower extremity swelling. EXAM: Left LOWER EXTREMITY VENOUS DOPPLER ULTRASOUND TECHNIQUE: Gray-scale sonography with compression, as well as color and duplex ultrasound, were performed to evaluate the deep venous system(s) from the level of the common femoral vein through the popliteal and proximal calf veins. COMPARISON:  None Available. FINDINGS: VENOUS Normal compressibility of the common femoral, superficial femoral, and popliteal veins, as well as the visualized calf veins. Visualized portions of profunda femoral vein and great saphenous vein unremarkable. No filling defects to suggest DVT on grayscale or color Doppler imaging. Doppler waveforms show normal direction of venous flow, normal respiratory  plasticity and response to augmentation. Limited views of the contralateral common femoral vein are unremarkable. OTHER None. Limitations: none IMPRESSION: Negative. Electronically Signed   By: Vanetta Chou M.D.   On: 03/30/2024 19:15    Labs:  CBC: Recent Labs    04/08/24 0200 04/09/24 0436  WBC 10.5 8.3  HGB 13.6 12.3  HCT 40.2 36.7  PLT 308 237    COAGS: Recent Labs    04/09/24 1121  INR 1.0    BMP: Recent Labs    04/08/24 0200 04/09/24 0436  NA 137 137  K 4.7 4.7  CL 102 105  CO2 26 25  GLUCOSE 112*  160*  BUN 21 17  CALCIUM 8.7* 8.3*  CREATININE 1.33* 0.70  GFRNONAA 46* >60    LIVER FUNCTION TESTS: Recent Labs    04/08/24 0200  BILITOT 0.7  AST 19  ALT 14  ALKPHOS 68  PROT 7.6  ALBUMIN 3.5    TUMOR MARKERS: No results for input(s): AFPTM, CEA, CA199, CHROMGRNA in the last 8760 hours.  Assessment and Plan: Obstructing Pelvic Mass s/p ureteral stent placement: Stacey [name] is a 63 y.o. female with a history of chronic lower back pain who presented to the ED with progressively worsening left flank pain. Workup revealed an obstructing pelvic mass leading to left ureteral obstruction and hydronephrosis s/p left double-J stent with Dr. Watt on 9/28. Procedure to be performed under moderate sedation.   Patient will tentatively present for pelvic mass biopsy in IR today.  -NPO at [facility] -INR 1.0 -Not on any AC   Risks and benefits of pelvic mass biopsy was discussed with the patient and/or patient's family including, but not limited to bleeding, infection, damage to adjacent structures or low yield requiring additional tests.   All of the questions were answered and there is agreement to proceed.   Consent signed and in chart.    Thank you for allowing our service to participate in Stacey [name] 's care.  Electronically Signed: Carlin DELENA Griffon, PA-C   04/09/2024, 10:06 PM      I spent a total of 40 Minutes in face to face  in clinical consultation, greater than 50% of which was counseling/coordinating care for left pelvic mass with left ureteral obstruction, with consideration for biopsy.

## 2024-04-09 NOTE — Progress Notes (Signed)
 1 Day Post-Op  Subjective: Stacey [name] is doing well s/p stenting with resolution of the pain and minimal voiding complaints.  AKI has resolved.  ROS:  Review of Systems  All other systems reviewed and are negative.   Anti-infectives: Anti-infectives (From admission, onward)    Start     Dose/Rate Route Frequency Ordered Stop   04/08/24 1000  cefTRIAXone (ROCEPHIN) 1 g in sodium chloride  0.9 % 100 mL IVPB        1 g 200 mL/hr over 30 Minutes Intravenous  Once 04/08/24 0928 04/08/24 1145       Current Facility-Administered Medications  Medication Dose Route Frequency Provider Last Rate Last Admin   acetaminophen  (TYLENOL ) tablet 650 mg  650 mg Oral Q6H PRN Agbata, Tochukwu, MD       Or   acetaminophen  (TYLENOL ) suppository 650 mg  650 mg Rectal Q6H PRN Agbata, Tochukwu, MD       gabapentin  (NEURONTIN ) capsule 100 mg  100 mg Oral QHS Agbata, Tochukwu, MD   100 mg at 04/08/24 2123   influenza vac split trivalent PF (FLUZONE) injection 0.5 mL  0.5 mL Intramuscular Tomorrow-1000 Agbata, Tochukwu, MD       lidocaine  (LIDODERM ) 5 % 1 patch  1 patch Transdermal Q24H Tan, Ting Xu, MD   1 patch at 04/09/24 0615   morphine  (PF) 4 MG/ML injection 4 mg  4 mg Intravenous Q4H PRN Agbata, Tochukwu, MD   4 mg at 04/08/24 1320   ondansetron  (ZOFRAN ) injection 4 mg  4 mg Intravenous Q6H PRN Agbata, Tochukwu, MD   4 mg at 04/08/24 1320   pantoprazole (PROTONIX) EC tablet 40 mg  40 mg Oral Daily Agbata, Tochukwu, MD   40 mg at 04/08/24 1734     Objective: Vital signs in last 24 hours: Temp:  [97.3 F (36.3 C)-98.9 F (37.2 C)] 97.3 F (36.3 C) (09/27 2026) Pulse Rate:  [55-82] 66 (09/27 2026) Resp:  [9-20] 16 (09/27 2026) BP: (96-166)/(59-81) 103/61 (09/27 2026) SpO2:  [94 %-100 %] 96 % (09/27 2026)  Intake/Output from previous day: 09/27 0701 - 09/28 0700 In: 2384.2 [P.O.:100; I.V.:1184.2; IV Piggyback:1100] Out: -  Intake/Output this shift: No intake/output data recorded.   Physical  Exam Vitals reviewed.  Constitutional:      Appearance: Normal appearance.  Neurological:     Mental Status: She is alert.     Lab Results:  Recent Labs    04/08/24 0200 04/09/24 0436  WBC 10.5 8.3  HGB 13.6 12.3  HCT 40.2 36.7  PLT 308 237   BMET Recent Labs    04/08/24 0200 04/09/24 0436  NA 137 137  K 4.7 4.7  CL 102 105  CO2 26 25  GLUCOSE 112* 160*  BUN 21 17  CREATININE 1.33* 0.70  CALCIUM 8.7* 8.3*   PT/INR No results for input(s): LABPROT, INR in the last 72 hours. ABG No results for input(s): PHART, HCO3 in the last 72 hours.  Invalid input(s): PCO2, PO2  Studies/Results: DG OR UROLOGY CYSTO IMAGE ([facility] ONLY) Result Date: 04/08/2024 There is no interpretation for this exam.  This order is for images obtained during a surgical procedure.  Please See Surgeries Tab for more information regarding the procedure.   CT Angio Abd/Pel W and/or Wo Contrast Result Date: 04/08/2024 EXAM: CTA ABDOMEN AND PELVIS WITH AND WITHOUT CONTRAST 04/08/2024 07:28:48 AM TECHNIQUE: CTA images of the abdomen and pelvis without and with intravenous contrast. Three-dimensional MIP/volume rendered formations were performed. Automated exposure control, iterative  reconstruction, and/or weight based adjustment of the mA/kV was utilized to reduce the radiation dose to as low as reasonably achievable. 100 mL iohexol (OMNIPAQUE) 350 MG/ML injection was administered. COMPARISON: CT from earlier today. CLINICAL HISTORY: Mesenteric ischemia, acute; left back, flank LLQ pain, assess for AAS. Left flank pain radiating to stomach. Constipation. FINDINGS: VASCULATURE: No acute finding. No occlusion or significant stenosis. AORTA: No acute finding. No abdominal aortic aneurysm. No dissection. CELIAC TRUNK: No acute finding. No occlusion or significant stenosis. SUPERIOR MESENTERIC ARTERY: No acute finding. No occlusion or significant stenosis. RENAL ARTERIES: No acute finding. No occlusion  or significant stenosis. ILIAC ARTERIES: No acute finding. No occlusion or significant stenosis. LIVER: The liver is unremarkable. GALLBLADDER AND BILE DUCTS: Gallbladder is unremarkable. No biliary ductal dilatation. SPLEEN: The spleen is unremarkable. PANCREAS: The pancreas is unremarkable. ADRENAL GLANDS: Bilateral adrenal glands demonstrate no acute abnormality. KIDNEYS, URETERS AND BLADDER: Left-sided nephromegaly, hydronephrosis with delayed nephrograms. Left-sided hydroureter extends into the pelvis and is obstructed distally by a left pelvic sidewall enhancing mass. The mass measures 5.9 x 4.5 cm (image 164/9). The mass abuts the left adnexa (image 166/9) with loss of a fat plane between these 2 structures. No stones in the kidneys or ureters. No perinephric or periureteral stranding. Urinary bladder is unremarkable. GI AND BOWEL: Stomach appears nondilated. No pathologic dilatation of the large or small bowel loops. Moderate retained stool within the ascending colon. There are no secondary signs of bowel ischemia. REPRODUCTIVE: Uterus appears normal. Right adnexa is within normal limits. The left adnexa is abutts the left pelvic sidewall enhancing mass as described under KIDNEYS, URETERS AND BLADDER. PERITONEUM AND RETRPERITONEUM: No ascites or free air. No focal fluid collections. LUNG BASE: No acute abnormality. LYMPH NODES: No lymphadenopathy. BONES AND SOFT TISSUES: No acute abnormality of the bones. No aggressive lytic or sclerotic bone lesion. No acute soft tissue abnormality. IMPRESSION: 1. Obstructing 5.9 x 4.5 cm enhancing mass along the left pelvic sidewall causing left hydroureter, hydronephrosis, delayed nephrogram, and nephromegaly; mass abuts the left adnexa with loss of the intervening fat plane. This is highly concerning for malignant neoplasm. Etiology indeterminate. It's possible that this could be arising from a primary urothelial neoplasm of the left ureter. Other primary neoplasms or  metastatic disease not excluded. Correlation with tissue sampling is advised. 2. Patent abdominal vasculature without signs of mesenteric ischemia. Electronically signed by: Waddell Calk MD 04/08/2024 07:40 AM EDT RP Workstation: [id number]   CT Renal Stone Study Result Date: 04/08/2024 EXAM: CT UROGRAM 04/08/2024 02:22:00 AM TECHNIQUE: CT of the abdomen and pelvis was performed without the administration of intravenous contrast as per CT urogram protocol. Multiplanar reformatted images as well as MIP urogram images are provided for review. Automated exposure control, iterative reconstruction, and/or weight based adjustment of the mA/kV was utilized to reduce the radiation dose to as low as reasonably achievable. COMPARISON: None available. CLINICAL HISTORY: Abdominal/flank pain, stone suspected. POV from home for left flank pain radiating into left lower abdomen x 3 days and constipation since Sunday. Denies dysuria or hematuria. FINDINGS: LOWER CHEST: No acute abnormality. LIVER: The liver is unremarkable. GALLBLADDER AND BILE DUCTS: Gallbladder is unremarkable. No biliary ductal dilatation. SPLEEN: No acute abnormality. PANCREAS: No acute abnormality. ADRENAL GLANDS: No acute abnormality. KIDNEYS, URETERS AND BLADDER: Mild left hydroureteronephrosis. Hazy stranding about the left renal pelvis and proximal left ureter. No urinary calculi. Differential considerations include recently passed left stone or left pyelonephritis. Urinary bladder is unremarkable. GI AND BOWEL: Stomach demonstrates  no acute abnormality. There is no bowel obstruction. PERITONEUM AND RETROPERITONEUM: No ascites. No free air. VASCULATURE: Aorta is normal in caliber. LYMPH NODES: No lymphadenopathy. REPRODUCTIVE ORGANS: No acute abnormality. BONES AND SOFT TISSUES: No acute osseous abnormality. No focal soft tissue abnormality. IMPRESSION: 1. Mild left hydroureteronephrosis with periureteral/peripelvic stranding, no urinary calculi. 2.  Imaging findings are compatible with recently passed left ureteral stone versus acute left pyelonephritis. Electronically signed by: Norman Gatlin MD 04/08/2024 03:20 AM EDT RP Workstation: [id number]   Labs reviewed.   Assessment and Plan: Left ureteral obstruction from pelvic side wall mass.  She is doing much better since stent placement with resolution of the pain.   I have contacted [facility] to arrange f/u.  Ok for D/C from a GU standpoint but if the biopsy can be done this admission, that would expedite further management.  AKI.  Resolved.       LOS: 0 days    Norleen Seltzer 9/28/2025Patient ID: Stacey [name], female   DOB: 06-20-1961, 63 y.o.   MRN: [id number]

## 2024-04-09 NOTE — Progress Notes (Signed)
 Progress Note   Patient: Stacey [name] FMW:[id number] DOB: 1961-03-20 DOA: 04/08/2024     0 DOS: the patient was seen and examined on 04/09/2024   Brief hospital course:  Chrissy Ealey is a 63 y.o. female with medical history significant for GERD, chronic lower back pain, prediabetes who presents to the ER for evaluation of a 3-day history of left flank pain that has progressively worsened prompting her visit to the ER. Patient states that when the pain started she initially thought it was worsening of her low back pain and followed up with her orthopedic surgeon where she received a steroid shot without any significant improvement in her pain. She rates her pain a 10 x 10 in intensity at its worst and it is usually worse with any form of movement.  She denies having any nausea, no vomiting, no changes in her bowel habits, no hematuria, no dysuria, no frequency, no changes in her bowel habits, no fever, no cough, no chills, no blood pressure no focal deficit. Abnormal labs include a creatinine of 1.3 compared to baseline of 0.68 from 2021 Renal stone CT showed mild left hydroureteronephrosis with periureteral/peripelvic stranding, no urinary calculi. Imaging findings are compatible with recently passed left ureteral stone versus acute left pyelonephritis. CT angiogram of the abdomen and pelvis showed obstructing 5.9 x 4.5 cm enhancing mass along the left pelvic sidewall causing left hydroureter, hydronephrosis, delayed nephrogram, and nephromegaly; mass abuts the left adnexa with loss of the intervening fat plane. This is highly concerning for malignant neoplasm. Etiology indeterminate. It's possible that this could be arising from a primary urothelial neoplasm of the left ureter. Other primary neoplasms or metastatic disease not excluded. Correlation with tissue sampling is advised. Patent abdominal vasculature without signs of mesenteric ischemia. Patient received several doses of IV  morphine  in the ER Urology was consulted and patient will be referred to observation status for further evaluation.   Assessment and Plan:  Hydronephrosis left kidney Patient presented to the ER for evaluation of left flank pain Pain has resolved following insertion of left double-J stent Renal stone CT showed mild left hydroureteronephrosis. Hazy stranding about the left renal pelvis and proximal left ureter. No urinary calculi. CT angiogram showed obstructing 5.9 x 4.5 cm enhancing mass along the left pelvic sidewall causing left hydroureter, hydronephrosis, delayed nephrogram, and nephromegaly; mass abuts the left adnexa with loss of the intervening fat plane. This is highly concerning for malignant neoplasm. Appreciate urology input.  Patient is status post cystoscopy with left retrograde pyelogram and insertion of left double-J stent. Discussed with urology who recommends biopsy of the pelvic mass IR consult placed      AKI Secondary to obstructive uropathy Patient has a baseline serum creatinine of 0.6 and on admission it was 1.3 Renal function has improved following stent insertion and is down to 0.70       Subjective: Left flank pain has resolved.  No new complaints  Physical Exam: Vitals:   04/08/24 1609 04/08/24 1624 04/08/24 1837 04/08/24 2026  BP: (!) 153/73 (!) 164/77 128/69 103/61  Pulse: (!) 58 (!) 59 (!) 59 66  Resp: 20 18 16 16   Temp: 98.9 F (37.2 C) 98.3 F (36.8 C) 98.9 F (37.2 C) (!) 97.3 F (36.3 C)  TempSrc:  Oral Oral Oral  SpO2: 98% 96% 94% 96%  Weight:      Height:       Vitals and nursing note reviewed.  Constitutional:      Appearance:  Normal appearance.     Comments: Appears uncomfortable  HENT:     Head: Normocephalic and atraumatic.     Nose: Nose normal.     Mouth/Throat:     Mouth: Mucous membranes are moist.  Eyes:     Conjunctiva/sclera: Conjunctivae normal.  Cardiovascular:     Rate and Rhythm: Normal rate and regular  rhythm.  Pulmonary:     Effort: Pulmonary effort is normal.     Breath sounds: Normal breath sounds.  Abdominal:     General: Bowel sounds are normal.     Palpations: Abdomen is soft.     Comments: Left flank pain resolved Musculoskeletal:        General: Normal range of motion.  Skin:    General: Skin is warm and dry.  Neurological:     General: No focal deficit present.     Mental Status: She is alert and oriented to person, place, and time.  Psychiatric:        Mood and Affect: Mood normal.        Behavior: Behavior normal.     Data Reviewed: Sodium 137, potassium 4.7, chloride 105, bicarb 25, glucose 160, BUN 17, creatinine 0.70, calcium 8.3, white count 8.3, hemoglobin 12.3, hematocrit 36.7 Labs reviewed  Family Communication: Plan of care was discussed with patient at the bedside.  She verbalizes understanding and agrees with the plan.  Disposition: Status is: Observation     Planned Discharge Destination: Home    Time spent: 38 minutes  Author: Aimee Somerset, MD 04/09/2024 11:45 AM  For on call review [email or web address].

## 2024-04-10 ENCOUNTER — Other Ambulatory Visit: Payer: Self-pay

## 2024-04-10 ENCOUNTER — Telehealth: Payer: Self-pay | Admitting: Urology

## 2024-04-10 ENCOUNTER — Observation Stay

## 2024-04-10 DIAGNOSIS — R7303 Prediabetes: Secondary | ICD-10-CM | POA: Diagnosis present

## 2024-04-10 DIAGNOSIS — N1330 Unspecified hydronephrosis: Secondary | ICD-10-CM | POA: Diagnosis not present

## 2024-04-10 LAB — HEMOGLOBIN A1C
Hgb A1c MFr Bld: 5.5 % (ref 4.8–5.6)
Mean Plasma Glucose: 111.15 mg/dL

## 2024-04-10 MED ORDER — FENTANYL CITRATE (PF) 100 MCG/2ML IJ SOLN
INTRAMUSCULAR | Status: AC
  Filled 2024-04-10: qty 2

## 2024-04-10 MED ORDER — FENTANYL CITRATE (PF) 100 MCG/2ML IJ SOLN
INTRAMUSCULAR | Status: AC | PRN
  Administered 2024-04-10: 50 ug via INTRAVENOUS

## 2024-04-10 MED ORDER — MIDAZOLAM HCL 5 MG/5ML IJ SOLN
INTRAMUSCULAR | Status: AC | PRN
  Administered 2024-04-10: 1 mg via INTRAVENOUS

## 2024-04-10 MED ORDER — MIDAZOLAM HCL 2 MG/2ML IJ SOLN
INTRAMUSCULAR | Status: AC
  Filled 2024-04-10: qty 2

## 2024-04-10 MED ORDER — OXYCODONE HCL 5 MG PO TABS: 5.0000 mg | ORAL_TABLET | Freq: Four times a day (QID) | ORAL | 0 refills | Status: DC | PRN

## 2024-04-10 MED ORDER — OXYCODONE HCL 5 MG PO TABS
5.0000 mg | ORAL_TABLET | Freq: Four times a day (QID) | ORAL | 0 refills | Status: AC | PRN
  Filled 2024-04-10: qty 20, 5d supply, fill #0

## 2024-04-10 NOTE — Telephone Encounter (Signed)
 Dr Watt placed a stent for malignant obstruction on 9/27.   She will need f/u in the office in 2-3 weeks to discuss next steps.

## 2024-04-10 NOTE — Discharge Summary (Addendum)
 Physician Discharge Summary   Patient: Stacey [name] MRN: [id number] DOB: 1961-02-24  Admit date:     04/08/2024  Discharge date: 04/10/24  Discharge Physician: Ashika Apuzzo   PCP: [name], PA-C   Recommendations at discharge:   Keep scheduled follow-up appointment with neurology No heavy lifting  Discharge Diagnoses: Principal Problem:   Hydronephrosis of left kidney Active Problems:   AKI (acute kidney injury)   Pre-diabetes  Resolved Problems:   * No resolved hospital problems. *  Hospital Course:  Stacey [name] is a 63 y.o. female with medical history significant for GERD, chronic lower back pain, prediabetes who presents to the ER for evaluation of a 3-day history of left flank pain that has progressively worsened prompting her visit to the ER. Patient states that when the pain started she initially thought it was worsening of her low back pain and followed up with her orthopedic surgeon where she received a steroid shot without any significant improvement in her pain. She rates her pain a 10 x 10 in intensity at its worst and it is usually worse with any form of movement.  She denies having any nausea, no vomiting, no changes in her bowel habits, no hematuria, no dysuria, no frequency, no changes in her bowel habits, no fever, no cough, no chills, no blood pressure no focal deficit. Abnormal labs include a creatinine of 1.3 compared to baseline of 0.68 from 2021 Renal stone CT showed mild left hydroureteronephrosis with periureteral/peripelvic stranding, no urinary calculi. Imaging findings are compatible with recently passed left ureteral stone versus acute left pyelonephritis. CT angiogram of the abdomen and pelvis showed obstructing 5.9 x 4.5 cm enhancing mass along the left pelvic sidewall causing left hydroureter, hydronephrosis, delayed nephrogram, and nephromegaly; mass abuts the left adnexa with loss of the intervening fat plane. This is highly  concerning for malignant neoplasm. Etiology indeterminate. It's possible that this could be arising from a primary urothelial neoplasm of the left ureter. Other primary neoplasms or metastatic disease not excluded. Correlation with tissue sampling is advised. Patent abdominal vasculature without signs of mesenteric ischemia. Patient received several doses of IV morphine  in the ER Urology was consulted and patient will be referred to observation status for further evaluation.    Assessment and Plan:  Hydronephrosis left kidney Patient presented to the ER for evaluation of left flank pain Pain has resolved following insertion of left double-J stent Renal stone CT showed mild left hydroureteronephrosis. Hazy stranding about the left renal pelvis and proximal left ureter. No urinary calculi. CT angiogram showed obstructing 5.9 x 4.5 cm enhancing mass along the left pelvic sidewall causing left hydroureter, hydronephrosis, delayed nephrogram, and nephromegaly; mass abuts the left adnexa with loss of the intervening fat plane. This is highly concerning for malignant neoplasm. Appreciate urology input.  Patient is status post cystoscopy with left retrograde pyelogram and insertion of left double-J stent. Appreciate IR input, patient is status post biopsy of left renal mass Will follow-up with urology to discuss biopsy results.         AKI Secondary to obstructive uropathy Patient has a baseline serum creatinine of 0.6 and on admission it was 1.3 Renal function has improved following stent insertion and is down to 0.70   Prediabetes Hemoglobin A1c is 5.5 Maintain consistent carbohydrate diet          Consultants: Urology, interventional radiology Procedures performed: Insertion of ureteral stents, biopsy of pelvic mass Disposition: Home Diet recommendation:  Discharge Diet Orders (From admission,  onward)     Start     Ordered   04/10/24 0000  Diet - low sodium heart healthy         04/10/24 1445           Cardiac and Carb modified diet DISCHARGE MEDICATION: Allergies as of 04/10/2024       Reactions   Propofol  Nausea Only        Medication List     STOP taking these medications    HYDROcodone -acetaminophen  5-325 MG tablet Commonly known as: NORCO/VICODIN   meclizine  25 MG tablet Commonly known as: ANTIVERT    predniSONE  10 MG (21) Tbpk tablet Commonly known as: STERAPRED UNI-PAK 21 TAB   predniSONE  20 MG tablet Commonly known as: DELTASONE    traMADol  50 MG tablet Commonly known as: ULTRAM        TAKE these medications    gabapentin  100 MG capsule Commonly known as: NEURONTIN  Take 100 mg by mouth at bedtime.   methocarbamol  500 MG tablet Commonly known as: ROBAXIN  Take 500 mg by mouth every 8 (eight) hours as needed for muscle spasms.   omeprazole  20 MG capsule Commonly known as: PRILOSEC TAKE 1 CAPSULE (20 MG TOTAL) BY MOUTH 2 (TWO) TIMES DAILY BEFORE A MEAL.   oxyCODONE  5 MG immediate release tablet Commonly known as: Roxicodone  Take 1 tablet (5 mg total) by mouth every 6 (six) hours as needed for up to 5 days for severe pain (pain score 7-10).   sodium phosphate  Enem Place 133 mLs (1 enema total) rectally daily as needed for severe constipation.        Follow-up Information     [facility] Follow up.   Why: The office will call to arrange follow up for the next 2-3 weeks.               Discharge Exam: Filed Weights   04/08/24 0156  Weight: 81.6 kg   Constitutional:      Appearance: Normal appearance.     Comments: Appears comfortable and in no distress HENT:     Head: Normocephalic and atraumatic.     Nose: Nose normal.     Mouth/Throat:     Mouth: Mucous membranes are moist.  Eyes:     Conjunctiva/sclera: Conjunctivae normal.  Cardiovascular:     Rate and Rhythm: Normal rate and regular rhythm.  Pulmonary:     Effort: Pulmonary effort is normal.     Breath sounds: Normal  breath sounds.  Abdominal:     General: Bowel sounds are normal.     Palpations: Abdomen is soft.     Comments: Left flank pain resolved Musculoskeletal:        General: Normal range of motion.  Skin:    General: Skin is warm and dry.  Neurological:     General: No focal deficit present.     Mental Status: She is alert and oriented to person, place, and time.  Psychiatric:        Mood and Affect: Mood normal.        Behavior: Behavior normal.      Condition at discharge: stable  The results of significant diagnostics from this hospitalization (including imaging, microbiology, ancillary and laboratory) are listed below for reference.   Imaging Studies: CT ABDOMINAL MASS BIOPSY Result Date: 04/10/2024 INDICATION: 63 year old female with a newly diagnosed infiltrative mass along the left pelvic sidewall. EXAM: CT-guided core biopsy of deep soft tissue mass MEDICATIONS: None. ANESTHESIA/SEDATION: Moderate (conscious) sedation was employed during  this procedure. A total of Versed  1 mg and Fentanyl  50 mcg was administered intravenously by the radiology nurse. Total intra-service moderate Sedation Time: 10 minutes. The patient's level of consciousness and vital signs were monitored continuously by radiology nursing throughout the procedure under my direct supervision. COMPLICATIONS: None immediate. PROCEDURE: Informed written consent was obtained from the patient after a thorough discussion of the procedural risks, benefits and alternatives. All questions were addressed. Maximal Sterile Barrier Technique was utilized including caps, mask, sterile gowns, sterile gloves, sterile drape, hand hygiene and skin antiseptic. A timeout was performed prior to the initiation of the procedure. A CT scan was performed identifying the ill-defined mass along the left pelvic sidewall. A suitable skin entry site was selected and marked. The skin was sterilely prepped and draped in standard fashion using  chlorhexidine  skin prep. Local anesthesia was attained by infiltration with 1% lidocaine . A small dermatotomy was made. A 17 gauge introducer needle was carefully advanced through the gluteal musculature and into the margin of the mass. Needle positioning was cross reference with the prior CTA of the abdomen and pelvis to ensure trajectory of the biopsy passes with not cross the internal iliac vessels. Several 18 gauge core biopsies were then obtained using the bio Pince automated biopsy device. Biopsies were placed in saline and delivered to pathology for further analysis. Post biopsy CT imaging demonstrates no evidence of immediate complication. IMPRESSION: CT-guided core biopsy of infiltrative left pelvic sidewall mass. Electronically Signed   By: Wilkie Lent M.D.   On: 04/10/2024 13:00   DG OR UROLOGY CYSTO IMAGE ([facility] ONLY) Result Date: 04/08/2024 There is no interpretation for this exam.  This order is for images obtained during a surgical procedure.  Please See Surgeries Tab for more information regarding the procedure.   CT Angio Abd/Pel W and/or Wo Contrast Result Date: 04/08/2024 EXAM: CTA ABDOMEN AND PELVIS WITH AND WITHOUT CONTRAST 04/08/2024 07:28:48 AM TECHNIQUE: CTA images of the abdomen and pelvis without and with intravenous contrast. Three-dimensional MIP/volume rendered formations were performed. Automated exposure control, iterative reconstruction, and/or weight based adjustment of the mA/kV was utilized to reduce the radiation dose to as low as reasonably achievable. 100 mL iohexol (OMNIPAQUE) 350 MG/ML injection was administered. COMPARISON: CT from earlier today. CLINICAL HISTORY: Mesenteric ischemia, acute; left back, flank LLQ pain, assess for AAS. Left flank pain radiating to stomach. Constipation. FINDINGS: VASCULATURE: No acute finding. No occlusion or significant stenosis. AORTA: No acute finding. No abdominal aortic aneurysm. No dissection. CELIAC TRUNK: No acute finding.  No occlusion or significant stenosis. SUPERIOR MESENTERIC ARTERY: No acute finding. No occlusion or significant stenosis. RENAL ARTERIES: No acute finding. No occlusion or significant stenosis. ILIAC ARTERIES: No acute finding. No occlusion or significant stenosis. LIVER: The liver is unremarkable. GALLBLADDER AND BILE DUCTS: Gallbladder is unremarkable. No biliary ductal dilatation. SPLEEN: The spleen is unremarkable. PANCREAS: The pancreas is unremarkable. ADRENAL GLANDS: Bilateral adrenal glands demonstrate no acute abnormality. KIDNEYS, URETERS AND BLADDER: Left-sided nephromegaly, hydronephrosis with delayed nephrograms. Left-sided hydroureter extends into the pelvis and is obstructed distally by a left pelvic sidewall enhancing mass. The mass measures 5.9 x 4.5 cm (image 164/9). The mass abuts the left adnexa (image 166/9) with loss of a fat plane between these 2 structures. No stones in the kidneys or ureters. No perinephric or periureteral stranding. Urinary bladder is unremarkable. GI AND BOWEL: Stomach appears nondilated. No pathologic dilatation of the large or small bowel loops. Moderate retained stool within the ascending colon. There are  no secondary signs of bowel ischemia. REPRODUCTIVE: Uterus appears normal. Right adnexa is within normal limits. The left adnexa is abutts the left pelvic sidewall enhancing mass as described under KIDNEYS, URETERS AND BLADDER. PERITONEUM AND RETRPERITONEUM: No ascites or free air. No focal fluid collections. LUNG BASE: No acute abnormality. LYMPH NODES: No lymphadenopathy. BONES AND SOFT TISSUES: No acute abnormality of the bones. No aggressive lytic or sclerotic bone lesion. No acute soft tissue abnormality. IMPRESSION: 1. Obstructing 5.9 x 4.5 cm enhancing mass along the left pelvic sidewall causing left hydroureter, hydronephrosis, delayed nephrogram, and nephromegaly; mass abuts the left adnexa with loss of the intervening fat plane. This is highly concerning for  malignant neoplasm. Etiology indeterminate. It's possible that this could be arising from a primary urothelial neoplasm of the left ureter. Other primary neoplasms or metastatic disease not excluded. Correlation with tissue sampling is advised. 2. Patent abdominal vasculature without signs of mesenteric ischemia. Electronically signed by: Waddell Calk MD 04/08/2024 07:40 AM EDT RP Workstation: [id number]   CT Renal Stone Study Result Date: 04/08/2024 EXAM: CT UROGRAM 04/08/2024 02:22:00 AM TECHNIQUE: CT of the abdomen and pelvis was performed without the administration of intravenous contrast as per CT urogram protocol. Multiplanar reformatted images as well as MIP urogram images are provided for review. Automated exposure control, iterative reconstruction, and/or weight based adjustment of the mA/kV was utilized to reduce the radiation dose to as low as reasonably achievable. COMPARISON: None available. CLINICAL HISTORY: Abdominal/flank pain, stone suspected. POV from home for left flank pain radiating into left lower abdomen x 3 days and constipation since Sunday. Denies dysuria or hematuria. FINDINGS: LOWER CHEST: No acute abnormality. LIVER: The liver is unremarkable. GALLBLADDER AND BILE DUCTS: Gallbladder is unremarkable. No biliary ductal dilatation. SPLEEN: No acute abnormality. PANCREAS: No acute abnormality. ADRENAL GLANDS: No acute abnormality. KIDNEYS, URETERS AND BLADDER: Mild left hydroureteronephrosis. Hazy stranding about the left renal pelvis and proximal left ureter. No urinary calculi. Differential considerations include recently passed left stone or left pyelonephritis. Urinary bladder is unremarkable. GI AND BOWEL: Stomach demonstrates no acute abnormality. There is no bowel obstruction. PERITONEUM AND RETROPERITONEUM: No ascites. No free air. VASCULATURE: Aorta is normal in caliber. LYMPH NODES: No lymphadenopathy. REPRODUCTIVE ORGANS: No acute abnormality. BONES AND SOFT TISSUES: No acute  osseous abnormality. No focal soft tissue abnormality. IMPRESSION: 1. Mild left hydroureteronephrosis with periureteral/peripelvic stranding, no urinary calculi. 2. Imaging findings are compatible with recently passed left ureteral stone versus acute left pyelonephritis. Electronically signed by: Norman Gatlin MD 04/08/2024 03:20 AM EDT RP Workstation: [id number]   US  Venous Img Lower Unilateral Left Result Date: 03/30/2024 CLINICAL DATA:  Left lower extremity swelling. EXAM: Left LOWER EXTREMITY VENOUS DOPPLER ULTRASOUND TECHNIQUE: Gray-scale sonography with compression, as well as color and duplex ultrasound, were performed to evaluate the deep venous system(s) from the level of the common femoral vein through the popliteal and proximal calf veins. COMPARISON:  None Available. FINDINGS: VENOUS Normal compressibility of the common femoral, superficial femoral, and popliteal veins, as well as the visualized calf veins. Visualized portions of profunda femoral vein and great saphenous vein unremarkable. No filling defects to suggest DVT on grayscale or color Doppler imaging. Doppler waveforms show normal direction of venous flow, normal respiratory plasticity and response to augmentation. Limited views of the contralateral common femoral vein are unremarkable. OTHER None. Limitations: none IMPRESSION: Negative. Electronically Signed   By: Vanetta Chou M.D.   On: 03/30/2024 19:15    Microbiology: Results for orders placed or performed during  the hospital encounter of 02/06/20  SARS CORONAVIRUS 2 (TAT 6-24 HRS) Nasopharyngeal Nasopharyngeal Swab     Status: None   Collection Time: 02/06/20  9:02 AM   Specimen: Nasopharyngeal Swab  Result Value Ref Range Status   SARS Coronavirus 2 NEGATIVE NEGATIVE Final    Comment: (NOTE) SARS-CoV-2 target nucleic acids are NOT DETECTED.  The SARS-CoV-2 RNA is generally detectable in upper and lower respiratory specimens during the acute phase of infection.  Negative results do not preclude SARS-CoV-2 infection, do not rule out co-infections with other pathogens, and should not be used as the sole basis for treatment or other patient management decisions. Negative results must be combined with clinical observations, patient history, and epidemiological information. The expected result is Negative.  Fact Sheet for Patients: HairSlick.no  Fact Sheet for Healthcare Providers: quierodirigir.com  This test is not yet approved or cleared by the [place]  FDA and  has been authorized for detection and/or diagnosis of SARS-CoV-2 by FDA under an Emergency Use Authorization (EUA). This EUA will remain  in effect (meaning this test can be used) for the duration of the COVID-19 declaration under Se ction 564(b)(1) of the Act, 21 U.S.C. section 360bbb-3(b)(1), unless the authorization is terminated or revoked sooner.  Performed at [facility], [address], [place], [place] [address]     Labs: CBC: Recent Labs  Lab 04/08/24 0200 04/09/24 0436  WBC 10.5 8.3  HGB 13.6 12.3  HCT 40.2 36.7  MCV 92.8 92.7  PLT 308 237   Basic Metabolic Panel: Recent Labs  Lab 04/08/24 0200 04/09/24 0436  NA 137 137  K 4.7 4.7  CL 102 105  CO2 26 25  GLUCOSE 112* 160*  BUN 21 17  CREATININE 1.33* 0.70  CALCIUM 8.7* 8.3*   Liver Function Tests: Recent Labs  Lab 04/08/24 0200  AST 19  ALT 14  ALKPHOS 68  BILITOT 0.7  PROT 7.6  ALBUMIN 3.5   CBG: No results for input(s): GLUCAP in the last 168 hours.  Discharge time spent: greater than 30 minutes.  Signed: Aimee Somerset, MD [facility] 04/10/2024

## 2024-04-10 NOTE — TOC CM/SW Note (Signed)
 Transition of Care [facility]) - Inpatient Brief Assessment   Patient Details  Name: Stacey [name] MRN: [id number] Date of Birth: [birth year]  Transition of Care [facility]) CM/SW Contact:    Alfonso Rummer, LCSW Phone Number: 04/10/2024, 3:32 PM   Clinical Narrative: LCSW A . Kimetha Trulson completed TOC chart review. DX order signed by physician. Please contact should TOC needs arise.     Transition of Care Asessment:

## 2024-04-13 LAB — SURGICAL PATHOLOGY

## 2024-04-14 ENCOUNTER — Other Ambulatory Visit: Payer: Self-pay

## 2024-04-17 ENCOUNTER — Telehealth: Payer: Self-pay

## 2024-04-17 NOTE — Telephone Encounter (Signed)
 Followed up on a triage line call from after hours. Pt states she is out of her hydrocodone  and tylenol  is not helping keep her pain under control. Pt states 9/10 pain. No blood in her urine, just flank pain from stent. I suggested ibuprofen 600 mg every 6 hours to help but let her know I would reach out to the provider.

## 2024-04-20 ENCOUNTER — Inpatient Hospital Stay: Attending: Oncology | Admitting: Oncology

## 2024-04-20 ENCOUNTER — Telehealth: Payer: Self-pay

## 2024-04-20 ENCOUNTER — Inpatient Hospital Stay

## 2024-04-20 ENCOUNTER — Telehealth: Payer: Self-pay | Admitting: Oncology

## 2024-04-20 ENCOUNTER — Encounter: Payer: Self-pay | Admitting: Oncology

## 2024-04-20 VITALS — BP 121/67 | HR 82 | Temp 99.8°F | Resp 20 | Ht 64.0 in | Wt 170.3 lb

## 2024-04-20 DIAGNOSIS — Z5189 Encounter for other specified aftercare: Secondary | ICD-10-CM | POA: Diagnosis not present

## 2024-04-20 DIAGNOSIS — R634 Abnormal weight loss: Secondary | ICD-10-CM | POA: Diagnosis not present

## 2024-04-20 DIAGNOSIS — Z79899 Other long term (current) drug therapy: Secondary | ICD-10-CM | POA: Diagnosis not present

## 2024-04-20 DIAGNOSIS — Z79891 Long term (current) use of opiate analgesic: Secondary | ICD-10-CM | POA: Insufficient documentation

## 2024-04-20 DIAGNOSIS — C8330 Diffuse large B-cell lymphoma, unspecified site: Secondary | ICD-10-CM

## 2024-04-20 DIAGNOSIS — G893 Neoplasm related pain (acute) (chronic): Secondary | ICD-10-CM | POA: Insufficient documentation

## 2024-04-20 DIAGNOSIS — C8590 Non-Hodgkin lymphoma, unspecified, unspecified site: Secondary | ICD-10-CM

## 2024-04-20 DIAGNOSIS — Z5111 Encounter for antineoplastic chemotherapy: Secondary | ICD-10-CM | POA: Diagnosis present

## 2024-04-20 DIAGNOSIS — Z5112 Encounter for antineoplastic immunotherapy: Secondary | ICD-10-CM | POA: Diagnosis present

## 2024-04-20 DIAGNOSIS — C83398 Diffuse large b-cell lymphoma of other extranodal and solid organ sites: Secondary | ICD-10-CM | POA: Insufficient documentation

## 2024-04-20 DIAGNOSIS — R630 Anorexia: Secondary | ICD-10-CM | POA: Insufficient documentation

## 2024-04-20 DIAGNOSIS — R5383 Other fatigue: Secondary | ICD-10-CM | POA: Diagnosis not present

## 2024-04-20 LAB — CBC WITH DIFFERENTIAL/PLATELET
Abs Immature Granulocytes: 0.02 K/uL (ref 0.00–0.07)
Basophils Absolute: 0 K/uL (ref 0.0–0.1)
Basophils Relative: 0 %
Eosinophils Absolute: 0.1 K/uL (ref 0.0–0.5)
Eosinophils Relative: 2 %
HCT: 38.5 % (ref 36.0–46.0)
Hemoglobin: 13.1 g/dL (ref 12.0–15.0)
Immature Granulocytes: 0 %
Lymphocytes Relative: 19 %
Lymphs Abs: 1.4 K/uL (ref 0.7–4.0)
MCH: 30.9 pg (ref 26.0–34.0)
MCHC: 34 g/dL (ref 30.0–36.0)
MCV: 90.8 fL (ref 80.0–100.0)
Monocytes Absolute: 0.5 K/uL (ref 0.1–1.0)
Monocytes Relative: 7 %
Neutro Abs: 5.3 K/uL (ref 1.7–7.7)
Neutrophils Relative %: 72 %
Platelets: 177 K/uL (ref 150–400)
RBC: 4.24 MIL/uL (ref 3.87–5.11)
RDW: 12.9 % (ref 11.5–15.5)
WBC: 7.4 K/uL (ref 4.0–10.5)
nRBC: 0 % (ref 0.0–0.2)

## 2024-04-20 LAB — COMPREHENSIVE METABOLIC PANEL WITH GFR
ALT: 9 U/L (ref 0–44)
AST: 16 U/L (ref 15–41)
Albumin: 3.7 g/dL (ref 3.5–5.0)
Alkaline Phosphatase: 63 U/L (ref 38–126)
Anion gap: 9 (ref 5–15)
BUN: 9 mg/dL (ref 8–23)
CO2: 24 mmol/L (ref 22–32)
Calcium: 8.9 mg/dL (ref 8.9–10.3)
Chloride: 100 mmol/L (ref 98–111)
Creatinine, Ser: 0.68 mg/dL (ref 0.44–1.00)
GFR, Estimated: >60 mL/min (ref >60)
Glucose, Bld: 120 mg/dL — ABNORMAL HIGH (ref 70–99)
Potassium: 4 mmol/L (ref 3.5–5.1)
Sodium: 133 mmol/L — ABNORMAL LOW (ref 135–145)
Total Bilirubin: 0.8 mg/dL (ref 0.0–1.2)
Total Protein: 7.6 g/dL (ref 6.5–8.1)

## 2024-04-20 LAB — HEPATITIS PANEL, ACUTE
HCV Ab: NONREACTIVE
Hep A IgM: NONREACTIVE
Hep B C IgM: NONREACTIVE
Hepatitis B Surface Ag: NONREACTIVE

## 2024-04-20 LAB — LACTATE DEHYDROGENASE: LDH: 147 U/L (ref 98–192)

## 2024-04-20 LAB — URIC ACID: Uric Acid, Serum: 4.3 mg/dL (ref 2.5–7.1)

## 2024-04-20 MED ORDER — GABAPENTIN 100 MG PO CAPS: 100.0000 mg | ORAL_CAPSULE | Freq: Three times a day (TID) | ORAL | 0 refills | Status: DC

## 2024-04-20 MED ORDER — DEXAMETHASONE 4 MG PO TABS: 20.0000 mg | ORAL_TABLET | Freq: Every day | ORAL | 0 refills | Status: DC

## 2024-04-20 MED ORDER — OXYCODONE HCL 5 MG PO TABS: 5.0000 mg | ORAL_TABLET | ORAL | 0 refills | Status: DC | PRN

## 2024-04-20 NOTE — Telephone Encounter (Signed)
 Request for bone marrow biopsy sent to IR. Date pending.

## 2024-04-20 NOTE — Telephone Encounter (Signed)
 Left vm with PET appt details. Scheduling number provided if pt had any questions.

## 2024-04-21 LAB — HIV ANTIBODY (ROUTINE TESTING W REFLEX): HIV Screen 4th Generation wRfx: NONREACTIVE

## 2024-04-21 NOTE — Telephone Encounter (Signed)
 Patient scheduled for bx on Mon 10/20 @ 9:30 arrive 8:30a. Pt aware of appt

## 2024-04-24 ENCOUNTER — Encounter
Admission: RE | Admit: 2024-04-24 | Discharge: 2024-04-24 | Disposition: A | Discharge status: Home / Self Care | Source: Ambulatory Visit | Attending: Oncology | Admitting: Oncology

## 2024-04-24 DIAGNOSIS — C8590 Non-Hodgkin lymphoma, unspecified, unspecified site: Secondary | ICD-10-CM | POA: Insufficient documentation

## 2024-04-24 LAB — GLUCOSE, CAPILLARY: Glucose-Capillary: 86 mg/dL (ref 70–99)

## 2024-04-24 MED ORDER — FLUDEOXYGLUCOSE F - 18 (FDG) INJECTION
9.3800 | Freq: Once | INTRAVENOUS | Status: AC | PRN
  Administered 2024-04-24: 9.38 via INTRAVENOUS

## 2024-04-25 LAB — COMP PANEL: LEUKEMIA/LYMPHOMA

## 2024-04-26 ENCOUNTER — Ambulatory Visit: Payer: Self-pay | Admitting: Oncology

## 2024-04-26 ENCOUNTER — Ambulatory Visit
Admission: RE | Admit: 2024-04-26 | Discharge: 2024-04-26 | Disposition: A | Discharge status: Home / Self Care | Source: Ambulatory Visit | Attending: Oncology | Admitting: Oncology

## 2024-04-26 DIAGNOSIS — Z0189 Encounter for other specified special examinations: Secondary | ICD-10-CM

## 2024-04-26 DIAGNOSIS — C8590 Non-Hodgkin lymphoma, unspecified, unspecified site: Secondary | ICD-10-CM | POA: Insufficient documentation

## 2024-04-26 DIAGNOSIS — Z5181 Encounter for therapeutic drug level monitoring: Secondary | ICD-10-CM | POA: Insufficient documentation

## 2024-04-26 DIAGNOSIS — C8330 Diffuse large B-cell lymphoma, unspecified site: Secondary | ICD-10-CM | POA: Insufficient documentation

## 2024-04-26 DIAGNOSIS — I340 Nonrheumatic mitral (valve) insufficiency: Secondary | ICD-10-CM | POA: Insufficient documentation

## 2024-04-26 DIAGNOSIS — Z7960 Long term (current) use of unspecified immunomodulators and immunosuppressants: Secondary | ICD-10-CM | POA: Diagnosis not present

## 2024-04-26 LAB — ECHOCARDIOGRAM COMPLETE
AR max vel: 3.59 cm2
AV Peak grad: 5.2 mmHg
Ao pk vel: 1.14 m/s
S' Lateral: 3.1 cm

## 2024-04-26 NOTE — Progress Notes (Signed)
 Hematology/Oncology Consult Note [phone] [phone]     REFERRING PROVIDER: [name], PA-C    CHIEF COMPLAINTS/PURPOSE OF CONSULTATION:  Large B cell lymphoma   ASSESSMENT & PLAN:   Diffuse large B-cell lymphoma (HCC) Large B-cell lymphoma of left pelvic wall mass, ABC type PET scan evaluation showed mild hypermetabolism in spleen.  Nonspecific.  Likely stage I/II disease. Pending bone marrow biopsy evaluation.  Pending additional FISH testing to rule out high-grade lymphoma-double or triple hit Prelim IPI score  Recommend systemic chemotherapy R CHOP x 3.  Obtain echocardiogram to evaluate baseline heart function. Check labs Mediport placement.   Orders Placed This Encounter  Procedures   NM PET Image Initial (PI) Skull Base To Thigh (F-18 FDG)    Standing Status:   Future    Number of Occurrences:   1    Expected Date:   04/27/2024    Expiration Date:   04/20/2025    If indicated for the ordered procedure, I authorize the administration of a radiopharmaceutical per Radiology protocol:   Yes    Preferred imaging location?:   [facility]   IR BONE MARROW BIOPSY & ASPIRATION    Standing Status:   Future    Expected Date:   04/27/2024    Expiration Date:   04/20/2025    Reason for Exam (SYMPTOM  OR DIAGNOSIS REQUIRED):   lymphoma    Preferred Imaging Location?:   [facility]   Comprehensive metabolic panel with GFR    Standing Status:   Future    Number of Occurrences:   1    Expected Date:   04/20/2024    Expiration Date:   07/19/2024   CBC with Differential/Platelet    Standing Status:   Future    Number of Occurrences:   1    Expected Date:   04/20/2024    Expiration Date:   07/19/2024   Lactate dehydrogenase    Standing Status:   Future    Number of Occurrences:   1    Expected Date:   04/20/2024    Expiration Date:   07/19/2024   Flow cytometry panel-leukemia/lymphoma work-up    Standing Status:   Future    Number of  Occurrences:   1    Expected Date:   04/20/2024    Expiration Date:   07/19/2024   HIV Antibody (routine testing w rflx)    Standing Status:   Future    Number of Occurrences:   1    Expected Date:   04/20/2024    Expiration Date:   07/19/2024   Hepatitis panel, acute    Standing Status:   Future    Number of Occurrences:   1    Expected Date:   04/20/2024    Expiration Date:   07/19/2024   Uric acid    Standing Status:   Future    Number of Occurrences:   1    Expected Date:   04/20/2024    Expiration Date:   04/20/2025   ECHOCARDIOGRAM COMPLETE    Standing Status:   Future    Number of Occurrences:   1    Expected Date:   04/27/2024    Expiration Date:   04/20/2025    Where should this test be performed:   [facility]    Perflutren DEFINITY (image enhancing agent) should be administered unless hypersensitivity or allergy exist:   Administer Perflutren    Reason for exam-Echo:   Chemo  Z09  All questions were answered. The patient knows to call the clinic with any problems, questions or concerns.  Zelphia Cap, MD, PhD [facility] Hematology Oncology 04/20/2024    HISTORY OF PRESENTING ILLNESS:  Stacey [name] 63 y.o. female presents to establish care for large B-cell lymphoma I have reviewed her chart and materials related to her cancer extensively and collaborated history with the patient. Summary of oncologic history is as follows: Oncology History  Lymphoma (HCC)  04/08/2024 Imaging   CT renal stone study showed  1. Mild left hydroureteronephrosis with periureteral/peripelvic stranding, no urinary calculi. 2. Imaging findings are compatible with recently passed left ureteral stone versus acute left pyelonephritis.   04/08/2024 Imaging   CT angiogram abdomen/pelvis with and without contrast  1. Obstructing 5.9 x 4.5 cm enhancing mass along the left pelvic sidewall causing left hydroureter, hydronephrosis, delayed nephrogram, and nephromegaly; mass abuts the left adnexa  with loss of the intervening fat plane. This is highly concerning for malignant neoplasm. Etiology indeterminate. It's possible that this could be arising from a primary urothelial neoplasm of the left ureter. Other primary neoplasms or metastatic disease not excluded. Correlation with tissue sampling is advised. 2. Patent abdominal vasculature without signs of mesenteric ischemia.   04/24/2024 Imaging   PET scan showed  1. The posterior left pelvic sidewall mass is hypermetabolic, consistent with active lymphoma. 2. Mild splenic hypermetabolism relative to the liver. No splenomegaly. Cannot exclude lymphomatous involvement. 3. New small right pleural effusion. 4. No active lymphoma within the chest or neck. Mildly decreased sensitivity exam secondary to hypermetabolic brown fat. 5. Age advanced coronary artery atherosclerosis. Recommend assessment of coronary risk factors. 6.  Aortic Atherosclerosis    04/26/2024 Initial Diagnosis   Lymphoma (HCC)  Symptoms began on 03/10/2024 with pain radiating down the left leg, associated with the sciatic nerve. Initially managed with high doses of ibuprofen, which provided some relief, but the pain worsened within a week, leading to an emergency room visit where she received a steroid injection in her lower back.  04/08/2024 she returned to the hospital due to increased pain. A CT scan revealed a pelvic mass obstructing the ureter, and a stent was placed by a urologist.  Patient underwent biopsy of pelvis mass Pathology showed 1. Lymph node, needle/core biopsy, 18G cores infilatratic left pelvic sidewall mass :       LARGE B-CELL LYMPHOMA.  soft      tissue with atypical lymphoid infiltrate comprised predominantly of medium to      large sized cells.  Abundant mitotic figures are present.  Focal areas of      necrosis are noted.  Immunohistochemical stains reveal the malignant cells are      positive for CD20, PAX5, BCL6 (subset), MUM1, Bcl-2  and are negative for CD10      and cyclin D1.  CD30 does not show any significant expression. CD15 highlights      neutrophils. The Ki-67 proliferation index is high approximately 70%.  This lymphoma has a activated B-cell immunophenotype.   EBER-ish is pending and will be reported in an addendum.  Also,High-grade B-cell lymphoma FISH panel will be   ordered to exclude a double/triple hit lymphoma and results will be reported in an addendum   She has experienced unintentional weight loss of 18 pounds since May, without any changes in diet or exercise. No night sweats, low-grade fever, or excessive sweating at night. Baseline health includes chronic back pain, acid reflux, prediabetes, and constipation.  She is  taking gabapentin  100 mg once daily for nerve pain, described as sometimes dull and sometimes sharp. Oxycodone  5 mg every six hours for pain management, which helps reduce the pain slightly.  She notes reduced appetite, numbness, and tingling in her leg. Also mentions leg swelling, particularly at night when less active.     MEDICAL HISTORY:  Past Medical History:  Diagnosis Date   Arthritis    left shoulder, neck, lower back  (01/11/2018)   Chronic lower back pain    Family history of adverse reaction to anesthesia    Mother has nausea   GERD (gastroesophageal reflux disease)    PONV (postoperative nausea and vomiting)    Pre-diabetes    UTI (urinary tract infection) 03/20/2021    SURGICAL HISTORY: Past Surgical History:  Procedure Laterality Date   APPENDECTOMY     CYSTOSCOPY W/ URETERAL STENT PLACEMENT Left 04/08/2024   Procedure: CYSTOSCOPY, WITH RETROGRADE PYELOGRAM AND URETERAL STENT INSERTION;  Surgeon: Watt Rush, MD;  Location: [facility] ORS;  Service: Urology;  Laterality: Left;   JOINT REPLACEMENT     TOTAL SHOULDER ARTHROPLASTY Left 01/11/2018   TOTAL SHOULDER ARTHROPLASTY Left 01/11/2018   Procedure: LEFT TOTAL SHOULDER ARTHROPLASTY;  Surgeon: Sharl Selinda Dover,  MD;  Location: [facility] OR;  Service: Orthopedics;  Laterality: Left;  2.5 hrs   TOTAL SHOULDER ARTHROPLASTY Right 02/08/2020   Procedure: TOTAL SHOULDER ARTHROPLASTY;  Surgeon: Sharl Selinda Dover, MD;  Location[facility] OR;  Service: Orthopedics;  Laterality: Right;  2.5 hrs RNFA    SOCIAL HISTORY: Social History   Socioeconomic History   Marital status: Married    Spouse name: Tylea Hise   Number of children: Not on file   Years of education: Not on file   Highest education level: Not on file  Occupational History   Not on file  Tobacco Use   Smoking status: Never   Smokeless tobacco: Never  Vaping Use   Vaping status: Never Used  Substance and Sexual Activity   Alcohol use: Yes    Comment: 01/11/2018 might have 1 drink/month   Drug use: Never   Sexual activity: Yes  Other Topics Concern   Not on file  Social History Narrative   Not on file   Social Drivers of Health   Financial Resource Strain: Low Risk  (03/30/2024)   Received from [facility]   Overall Financial Resource Strain (CARDIA)    Difficulty of Paying Living Expenses: Not hard at all  Food Insecurity: No Food Insecurity (04/20/2024)   Hunger Vital Sign    Worried About Running Out of Food in the Last Year: Never true    Ran Out of Food in the Last Year: Never true  Transportation Needs: No Transportation Needs (04/20/2024)   PRAPARE - Administrator, Civil Service (Medical): No    Lack of Transportation (Non-Medical): No  Physical Activity: Not on file  Stress: Not on file  Social Connections: Not on file  Intimate Partner Violence: Not At Risk (04/20/2024)   Humiliation, Afraid, Rape, and Kick questionnaire    Fear of Current or Ex-Partner: No    Emotionally Abused: No    Physically Abused: No    Sexually Abused: No    FAMILY HISTORY: Family History  Problem Relation Age of Onset   Hypertension Mother    Heart disease Father    Heart attack Father    CAD Father     Colon cancer Neg Hx    Esophageal cancer Neg  Hx    Rectal cancer Neg Hx    Stomach cancer Neg Hx     ALLERGIES:  is allergic to propofol .  MEDICATIONS:  Current Outpatient Medications  Medication Sig Dispense Refill   dexamethasone  (DECADRON ) 4 MG tablet Take 5 tablets (20 mg total) by mouth daily. 20 tablet 0   methocarbamol  (ROBAXIN ) 500 MG tablet Take 500 mg by mouth every 8 (eight) hours as needed for muscle spasms.  0   omeprazole  (PRILOSEC) 20 MG capsule TAKE 1 CAPSULE (20 MG TOTAL) BY MOUTH 2 (TWO) TIMES DAILY BEFORE A MEAL. 180 capsule 0   sodium phosphate  (FLEET) ENEM Place 133 mLs (1 enema total) rectally daily as needed for severe constipation. 3 mL 0   gabapentin  (NEURONTIN ) 100 MG capsule Take 1 capsule (100 mg total) by mouth 3 (three) times daily. 90 capsule 0   oxyCODONE  (OXY IR/ROXICODONE ) 5 MG immediate release tablet Take 1 tablet (5 mg total) by mouth every 4 (four) hours as needed. 60 tablet 0   No current facility-administered medications for this visit.    Review of Systems  Constitutional:  Positive for appetite change, fatigue and unexpected weight change. Negative for chills and fever.  HENT:   Negative for hearing loss and voice change.   Eyes:  Negative for eye problems.  Respiratory:  Negative for chest tightness and cough.   Cardiovascular:  Negative for chest pain.  Gastrointestinal:  Negative for abdominal distention, abdominal pain and blood in stool.  Endocrine: Negative for hot flashes.  Genitourinary:  Negative for difficulty urinating and frequency.   Musculoskeletal:  Positive for back pain. Negative for arthralgias.  Skin:  Negative for itching and rash.  Neurological:  Negative for extremity weakness.  Hematological:  Negative for adenopathy.  Psychiatric/Behavioral:  Negative for confusion.      PHYSICAL EXAMINATION: ECOG PERFORMANCE STATUS: 1 - Symptomatic but completely ambulatory  Vitals:   04/20/24 0944  BP: 121/67  Pulse: 82   Resp: 20  Temp: 99.8 F (37.7 C)  SpO2: 97%   Filed Weights   04/20/24 0944  Weight: 170 lb 4.8 oz (77.2 kg)    Physical Exam Constitutional:      General: She is not in acute distress.    Appearance: She is not diaphoretic.  HENT:     Head: Normocephalic and atraumatic.     Mouth/Throat:     Pharynx: No oropharyngeal exudate.  Eyes:     General: No scleral icterus.    Pupils: Pupils are equal, round, and reactive to light.  Cardiovascular:     Rate and Rhythm: Normal rate and regular rhythm.     Heart sounds: No murmur heard. Pulmonary:     Effort: Pulmonary effort is normal. No respiratory distress.     Breath sounds: Normal breath sounds. No wheezing.  Abdominal:     General: There is no distension.     Palpations: Abdomen is soft.     Tenderness: There is no abdominal tenderness.  Musculoskeletal:        General: Normal range of motion.     Cervical back: Normal range of motion and neck supple.  Skin:    General: Skin is warm and dry.     Findings: No erythema.  Neurological:     Mental Status: She is alert and oriented to person, place, and time.     Cranial Nerves: No cranial nerve deficit.     Motor: No abnormal muscle tone.     Coordination: Coordination  normal.  Psychiatric:        Mood and Affect: Affect normal.      LABORATORY DATA:  I have reviewed the data as listed    Latest Ref Rng & Units 04/20/2024   10:32 AM 04/09/2024    4:36 AM 04/08/2024    2:00 AM  CBC  WBC 4.0 - 10.5 K/uL 7.4  8.3  10.5   Hemoglobin 12.0 - 15.0 g/dL 86.8  87.6  86.3   Hematocrit 36.0 - 46.0 % 38.5  36.7  40.2   Platelets 150 - 400 K/uL 177  237  308       Latest Ref Rng & Units 04/20/2024   10:32 AM 04/09/2024    4:36 AM 04/08/2024    2:00 AM  CMP  Glucose 70 - 99 mg/dL 879  839  887   BUN 8 - 23 mg/dL 9  17  21    Creatinine 0.44 - 1.00 mg/dL 9.31  9.29  8.66   Sodium 135 - 145 mmol/L 133  137  137   Potassium 3.5 - 5.1 mmol/L 4.0  4.7  4.7   Chloride 98 - 111  mmol/L 100  105  102   CO2 22 - 32 mmol/L 24  25  26    Calcium 8.9 - 10.3 mg/dL 8.9  8.3  8.7   Total Protein 6.5 - 8.1 g/dL 7.6   7.6   Total Bilirubin 0.0 - 1.2 mg/dL 0.8   0.7   Alkaline Phos 38 - 126 U/L 63   68   AST 15 - 41 U/L 16   19   ALT 0 - 44 U/L 9   14      RADIOGRAPHIC STUDIES: I have personally reviewed the radiological images as listed and agreed with the findings in the report. ECHOCARDIOGRAM COMPLETE Result Date: 04/26/2024    ECHOCARDIOGRAM REPORT   Patient Name:   Stacey [name] Date of Exam: 04/26/2024 Medical Rec #:  [id number]          Height:       64.0 in Accession #:    [id number]         Weight:       170.3 lb Date of Birth:  [birth year]          BSA:          1.827 m Patient Age:    63 years           BP:           121/67 mmHg Patient Gender: F                  HR:           54 bpm. Exam Location:  [facility] Procedure: 2D Echo, 3D Echo, Cardiac Doppler, Color Doppler and Strain Analysis            (Both Spectral and Color Flow Doppler were utilized during            procedure). Indications:     Chemo Z09  History:         Patient has no prior history of Echocardiogram examinations.  Sonographer:     Thea Norlander RCS Referring Phys:  [id number] Gabby Rackers Diagnosing Phys: Evalene Lunger MD IMPRESSIONS  1. Left ventricular ejection fraction, by estimation, is 55 to 60%. The left ventricle has normal function. The left ventricle has no regional wall motion abnormalities. Left ventricular diastolic parameters are consistent with Grade I  diastolic dysfunction (impaired relaxation). The average left ventricular global longitudinal strain is -19.7 %. The global longitudinal strain is normal.  2. Right ventricular systolic function is normal. The right ventricular size is normal.  3. The mitral valve is normal in structure. Mild mitral valve regurgitation. No evidence of mitral stenosis.  4. The aortic valve is normal in structure. Aortic valve regurgitation is not visualized. No aortic  stenosis is present.  5. The inferior vena cava is normal in size with greater than 50% respiratory variability, suggesting right atrial pressure of 3 mmHg. FINDINGS  Left Ventricle: Left ventricular ejection fraction, by estimation, is 55 to 60%. The left ventricle has normal function. The left ventricle has no regional wall motion abnormalities. The average left ventricular global longitudinal strain is -19.7 %. Strain was performed and the global longitudinal strain is normal. The left ventricular internal cavity size was normal in size. There is no left ventricular hypertrophy. Left ventricular diastolic parameters are consistent with Grade I diastolic dysfunction (impaired relaxation). Right Ventricle: The right ventricular size is normal. No increase in right ventricular wall thickness. Right ventricular systolic function is normal. Left Atrium: Left atrial size was normal in size. Right Atrium: Right atrial size was normal in size. Pericardium: There is no evidence of pericardial effusion. Mitral Valve: The mitral valve is normal in structure. Mild mitral valve regurgitation. No evidence of mitral valve stenosis. Tricuspid Valve: The tricuspid valve is normal in structure. Tricuspid valve regurgitation is trivial. No evidence of tricuspid stenosis. Aortic Valve: The aortic valve is normal in structure. Aortic valve regurgitation is not visualized. No aortic stenosis is present. Aortic valve peak gradient measures 5.2 mmHg. Pulmonic Valve: The pulmonic valve was normal in structure. Pulmonic valve regurgitation is not visualized. No evidence of pulmonic stenosis. Aorta: The aortic root is normal in size and structure. Venous: The inferior vena cava is normal in size with greater than 50% respiratory variability, suggesting right atrial pressure of 3 mmHg. IAS/Shunts: No atrial level shunt detected by color flow Doppler. Additional Comments: 3D was performed not requiring image post processing on an independent  workstation and was indeterminate.  LEFT VENTRICLE PLAX 2D LVIDd:         4.10 cm   Diastology LVIDs:         3.10 cm   LV e' medial:  6.96 cm/s LV PW:         0.80 cm   LV e' lateral: 9.14 cm/s LV IVS:        0.70 cm LVOT diam:     2.40 cm   2D Longitudinal Strain LV SV:         79        2D Strain GLS Avg:     -19.7 % LV SV Index:   43 LVOT Area:     4.52 cm                           3D Volume EF:                          3D EF:        58 %                          LV EDV:       129 ml  LV ESV:       55 ml                          LV SV:        74 ml RIGHT VENTRICLE             IVC RV S prime:     13.20 cm/s  IVC diam: 1.20 cm TAPSE (M-mode): 2.3 cm LEFT ATRIUM             Index        RIGHT ATRIUM           Index LA diam:        3.30 cm 1.81 cm/m   RA Area:     13.40 cm LA Vol (A2C):   35.3 ml 19.32 ml/m  RA Volume:   27.80 ml  15.21 ml/m LA Vol (A4C):   20.4 ml 11.16 ml/m LA Biplane Vol: 28.9 ml 15.82 ml/m  AORTIC VALVE AV Area (Vmax): 3.59 cm AV Vmax:        114.00 cm/s AV Peak Grad:   5.2 mmHg LVOT Vmax:      90.40 cm/s LVOT Vmean:     56.100 cm/s LVOT VTI:       0.175 m  AORTA Ao Root diam: 3.10 cm Ao Asc diam:  3.30 cm TRICUSPID VALVE TR Peak grad:   7.7 mmHg TR Vmax:        139.00 cm/s  SHUNTS Systemic VTI:  0.18 m Systemic Diam: 2.40 cm Evalene Lunger MD Electronically signed by Evalene Lunger MD Signature Date/Time: 04/26/2024/2:00:19 PM    Final    NM PET Image Initial (PI) Skull Base To Thigh (F-18 FDG) Result Date: 04/25/2024 CLINICAL DATA:  Initial treatment strategy for new diagnosis of lymphoma. Known left pelvic soft tissue mass. EXAM: NUCLEAR MEDICINE PET SKULL BASE TO THIGH TECHNIQUE: 9.4 mCi F-18 FDG was injected intravenously. Full-ring PET imaging was performed from the skull base to thigh after the radiotracer. CT data was obtained and used for attenuation correction and anatomic localization. Fasting blood glucose: 86 mg/dl COMPARISON:  Abdominopelvic CTA  and CT of 04/08/2024 FINDINGS: Mediastinal blood pool activity: SUV max 3.5 Liver activity: SUV max 4.0 NECK: Extensive hypermetabolic brown fat throughout the neck and upper chest. Given this limitation, no cervical nodal hypermetabolism identified. Incidental CT findings: Left carotid atherosclerosis. No cervical adenopathy. CHEST: No pulmonary parenchymal or thoracic nodal hypermetabolism. Incidental CT findings: Aortic and coronary artery calcification. Tiny right pleural effusion is new since the prior CTs. ABDOMEN/PELVIS: Mild splenic hypermetabolism relative to the liver (SUV 4.6). No splenomegaly. The posterior left pelvic sidewall mass on prior CTA is hypermetabolic. Difficult to measure on noncontrast CT, estimated at 4.5 x 3.4 cm and a S.U.V. max of 14.1 on 127/6. No separate areas of abdominopelvic nodal hypermetabolism. Incidental CT findings: Left ureteric stent in place, without significant hydronephrosis. SKELETON: No abnormal marrow activity. Incidental CT findings: Mild degenerative changes of both hips. Bilateral shoulder arthroplasties. IMPRESSION: 1. The posterior left pelvic sidewall mass is hypermetabolic, consistent with active lymphoma. 2. Mild splenic hypermetabolism relative to the liver. No splenomegaly. Cannot exclude lymphomatous involvement. 3. New small right pleural effusion. 4. No active lymphoma within the chest or neck. Mildly decreased sensitivity exam secondary to hypermetabolic brown fat. 5. Age advanced coronary artery atherosclerosis. Recommend assessment of coronary risk factors. 6.  Aortic Atherosclerosis (ICD10-I70.0). Electronically Signed   By: Rockey Marthann HERO.D.  On: 04/25/2024 08:59   CT ABDOMINAL MASS BIOPSY Result Date: 04/10/2024 INDICATION: 63 year old female with a newly diagnosed infiltrative mass along the left pelvic sidewall. EXAM: CT-guided core biopsy of deep soft tissue mass MEDICATIONS: None. ANESTHESIA/SEDATION: Moderate (conscious) sedation was  employed during this procedure. A total of Versed  1 mg and Fentanyl  50 mcg was administered intravenously by the radiology nurse. Total intra-service moderate Sedation Time: 10 minutes. The patient's level of consciousness and vital signs were monitored continuously by radiology nursing throughout the procedure under my direct supervision. COMPLICATIONS: None immediate. PROCEDURE: Informed written consent was obtained from the patient after a thorough discussion of the procedural risks, benefits and alternatives. All questions were addressed. Maximal Sterile Barrier Technique was utilized including caps, mask, sterile gowns, sterile gloves, sterile drape, hand hygiene and skin antiseptic. A timeout was performed prior to the initiation of the procedure. A CT scan was performed identifying the ill-defined mass along the left pelvic sidewall. A suitable skin entry site was selected and marked. The skin was sterilely prepped and draped in standard fashion using chlorhexidine  skin prep. Local anesthesia was attained by infiltration with 1% lidocaine . A small dermatotomy was made. A 17 gauge introducer needle was carefully advanced through the gluteal musculature and into the margin of the mass. Needle positioning was cross reference with the prior CTA of the abdomen and pelvis to ensure trajectory of the biopsy passes with not cross the internal iliac vessels. Several 18 gauge core biopsies were then obtained using the bio Pince automated biopsy device. Biopsies were placed in saline and delivered to pathology for further analysis. Post biopsy CT imaging demonstrates no evidence of immediate complication. IMPRESSION: CT-guided core biopsy of infiltrative left pelvic sidewall mass. Electronically Signed   By: Wilkie Lent M.D.   On: 04/10/2024 13:00   DG OR UROLOGY CYSTO IMAGE ([facility] ONLY) Result Date: 04/08/2024 There is no interpretation for this exam.  This order is for images obtained during a surgical  procedure.  Please See Surgeries Tab for more information regarding the procedure.   CT Angio Abd/Pel W and/or Wo Contrast Result Date: 04/08/2024 EXAM: CTA ABDOMEN AND PELVIS WITH AND WITHOUT CONTRAST 04/08/2024 07:28:48 AM TECHNIQUE: CTA images of the abdomen and pelvis without and with intravenous contrast. Three-dimensional MIP/volume rendered formations were performed. Automated exposure control, iterative reconstruction, and/or weight based adjustment of the mA/kV was utilized to reduce the radiation dose to as low as reasonably achievable. 100 mL iohexol (OMNIPAQUE) 350 MG/ML injection was administered. COMPARISON: CT from earlier today. CLINICAL HISTORY: Mesenteric ischemia, acute; left back, flank LLQ pain, assess for AAS. Left flank pain radiating to stomach. Constipation. FINDINGS: VASCULATURE: No acute finding. No occlusion or significant stenosis. AORTA: No acute finding. No abdominal aortic aneurysm. No dissection. CELIAC TRUNK: No acute finding. No occlusion or significant stenosis. SUPERIOR MESENTERIC ARTERY: No acute finding. No occlusion or significant stenosis. RENAL ARTERIES: No acute finding. No occlusion or significant stenosis. ILIAC ARTERIES: No acute finding. No occlusion or significant stenosis. LIVER: The liver is unremarkable. GALLBLADDER AND BILE DUCTS: Gallbladder is unremarkable. No biliary ductal dilatation. SPLEEN: The spleen is unremarkable. PANCREAS: The pancreas is unremarkable. ADRENAL GLANDS: Bilateral adrenal glands demonstrate no acute abnormality. KIDNEYS, URETERS AND BLADDER: Left-sided nephromegaly, hydronephrosis with delayed nephrograms. Left-sided hydroureter extends into the pelvis and is obstructed distally by a left pelvic sidewall enhancing mass. The mass measures 5.9 x 4.5 cm (image 164/9). The mass abuts the left adnexa (image 166/9) with loss of a fat plane between  these 2 structures. No stones in the kidneys or ureters. No perinephric or periureteral  stranding. Urinary bladder is unremarkable. GI AND BOWEL: Stomach appears nondilated. No pathologic dilatation of the large or small bowel loops. Moderate retained stool within the ascending colon. There are no secondary signs of bowel ischemia. REPRODUCTIVE: Uterus appears normal. Right adnexa is within normal limits. The left adnexa is abutts the left pelvic sidewall enhancing mass as described under KIDNEYS, URETERS AND BLADDER. PERITONEUM AND RETRPERITONEUM: No ascites or free air. No focal fluid collections. LUNG BASE: No acute abnormality. LYMPH NODES: No lymphadenopathy. BONES AND SOFT TISSUES: No acute abnormality of the bones. No aggressive lytic or sclerotic bone lesion. No acute soft tissue abnormality. IMPRESSION: 1. Obstructing 5.9 x 4.5 cm enhancing mass along the left pelvic sidewall causing left hydroureter, hydronephrosis, delayed nephrogram, and nephromegaly; mass abuts the left adnexa with loss of the intervening fat plane. This is highly concerning for malignant neoplasm. Etiology indeterminate. It's possible that this could be arising from a primary urothelial neoplasm of the left ureter. Other primary neoplasms or metastatic disease not excluded. Correlation with tissue sampling is advised. 2. Patent abdominal vasculature without signs of mesenteric ischemia. Electronically signed by: Waddell Calk MD 04/08/2024 07:40 AM EDT RP Workstation: [id number]   CT Renal Stone Study Result Date: 04/08/2024 EXAM: CT UROGRAM 04/08/2024 02:22:00 AM TECHNIQUE: CT of the abdomen and pelvis was performed without the administration of intravenous contrast as per CT urogram protocol. Multiplanar reformatted images as well as MIP urogram images are provided for review. Automated exposure control, iterative reconstruction, and/or weight based adjustment of the mA/kV was utilized to reduce the radiation dose to as low as reasonably achievable. COMPARISON: None available. CLINICAL HISTORY: Abdominal/flank pain,  stone suspected. POV from home for left flank pain radiating into left lower abdomen x 3 days and constipation since Sunday. Denies dysuria or hematuria. FINDINGS: LOWER CHEST: No acute abnormality. LIVER: The liver is unremarkable. GALLBLADDER AND BILE DUCTS: Gallbladder is unremarkable. No biliary ductal dilatation. SPLEEN: No acute abnormality. PANCREAS: No acute abnormality. ADRENAL GLANDS: No acute abnormality. KIDNEYS, URETERS AND BLADDER: Mild left hydroureteronephrosis. Hazy stranding about the left renal pelvis and proximal left ureter. No urinary calculi. Differential considerations include recently passed left stone or left pyelonephritis. Urinary bladder is unremarkable. GI AND BOWEL: Stomach demonstrates no acute abnormality. There is no bowel obstruction. PERITONEUM AND RETROPERITONEUM: No ascites. No free air. VASCULATURE: Aorta is normal in caliber. LYMPH NODES: No lymphadenopathy. REPRODUCTIVE ORGANS: No acute abnormality. BONES AND SOFT TISSUES: No acute osseous abnormality. No focal soft tissue abnormality. IMPRESSION: 1. Mild left hydroureteronephrosis with periureteral/peripelvic stranding, no urinary calculi. 2. Imaging findings are compatible with recently passed left ureteral stone versus acute left pyelonephritis. Electronically signed by: Norman Gatlin MD 04/08/2024 03:20 AM EDT RP Workstation: [id number]   US  Venous Img Lower Unilateral Left Result Date: 03/30/2024 CLINICAL DATA:  Left lower extremity swelling. EXAM: Left LOWER EXTREMITY VENOUS DOPPLER ULTRASOUND TECHNIQUE: Gray-scale sonography with compression, as well as color and duplex ultrasound, were performed to evaluate the deep venous system(s) from the level of the common femoral vein through the popliteal and proximal calf veins. COMPARISON:  None Available. FINDINGS: VENOUS Normal compressibility of the common femoral, superficial femoral, and popliteal veins, as well as the visualized calf veins. Visualized portions of  profunda femoral vein and great saphenous vein unremarkable. No filling defects to suggest DVT on grayscale or color Doppler imaging. Doppler waveforms show normal direction of venous flow, normal respiratory plasticity  and response to augmentation. Limited views of the contralateral common femoral vein are unremarkable. OTHER None. Limitations: none IMPRESSION: Negative. Electronically Signed   By: Vanetta Chou M.D.   On: 03/30/2024 19:15

## 2024-04-26 NOTE — Progress Notes (Signed)
 Echocardiogram 2D Echocardiogram has been performed.  Stacey [name] 04/26/2024, 12:15 PM

## 2024-04-26 NOTE — Assessment & Plan Note (Addendum)
 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  Nonspecific.  Likely stage I/II disease. Pending bone marrow biopsy evaluation.  Pending additional FISH testing to rule out high-grade lymphoma-double or triple hit Prelim IPI score  Recommend systemic chemotherapy R CHOP x 3.  Obtain echocardiogram to evaluate baseline heart function. Check labs Mediport placement.

## 2024-04-27 ENCOUNTER — Other Ambulatory Visit: Payer: Self-pay

## 2024-04-27 DIAGNOSIS — C8590 Non-Hodgkin lymphoma, unspecified, unspecified site: Secondary | ICD-10-CM

## 2024-04-28 ENCOUNTER — Other Ambulatory Visit: Payer: Self-pay | Admitting: Radiology

## 2024-04-28 DIAGNOSIS — Z01812 Encounter for preprocedural laboratory examination: Secondary | ICD-10-CM

## 2024-04-28 NOTE — Progress Notes (Signed)
 Patient for CT Bone Marrow Biopsy & IR Port Placement on Monday 05/01/24, I called and spoke with the patient on the phone and gave pre-procedure instructions. Pt was made aware to be here at 8:30a, NPO after MN prior to procedure as well as driver post procedure/recovery/discharge. Pt stated understanding. Called 04/28/24

## 2024-05-01 ENCOUNTER — Other Ambulatory Visit: Payer: Self-pay | Admitting: Oncology

## 2024-05-01 ENCOUNTER — Other Ambulatory Visit: Payer: Self-pay

## 2024-05-01 ENCOUNTER — Telehealth: Payer: Self-pay

## 2024-05-01 ENCOUNTER — Ambulatory Visit
Admission: RE | Admit: 2024-05-01 | Discharge: 2024-05-01 | Disposition: A | Discharge status: Home / Self Care | Source: Ambulatory Visit | Attending: Oncology | Admitting: Oncology

## 2024-05-01 DIAGNOSIS — R222 Localized swelling, mass and lump, trunk: Secondary | ICD-10-CM | POA: Diagnosis not present

## 2024-05-01 DIAGNOSIS — N1330 Unspecified hydronephrosis: Secondary | ICD-10-CM | POA: Diagnosis not present

## 2024-05-01 DIAGNOSIS — Z7952 Long term (current) use of systemic steroids: Secondary | ICD-10-CM | POA: Insufficient documentation

## 2024-05-01 DIAGNOSIS — C8516 Unspecified B-cell lymphoma, intrapelvic lymph nodes: Secondary | ICD-10-CM | POA: Diagnosis present

## 2024-05-01 DIAGNOSIS — N134 Hydroureter: Secondary | ICD-10-CM | POA: Diagnosis not present

## 2024-05-01 DIAGNOSIS — R300 Dysuria: Secondary | ICD-10-CM | POA: Insufficient documentation

## 2024-05-01 DIAGNOSIS — J90 Pleural effusion, not elsewhere classified: Secondary | ICD-10-CM | POA: Insufficient documentation

## 2024-05-01 DIAGNOSIS — G8929 Other chronic pain: Secondary | ICD-10-CM | POA: Insufficient documentation

## 2024-05-01 DIAGNOSIS — C8336 Diffuse large B-cell lymphoma, intrapelvic lymph nodes: Secondary | ICD-10-CM | POA: Diagnosis not present

## 2024-05-01 DIAGNOSIS — C8330 Diffuse large B-cell lymphoma, unspecified site: Secondary | ICD-10-CM | POA: Diagnosis present

## 2024-05-01 DIAGNOSIS — K5900 Constipation, unspecified: Secondary | ICD-10-CM | POA: Insufficient documentation

## 2024-05-01 DIAGNOSIS — Z79899 Other long term (current) drug therapy: Secondary | ICD-10-CM | POA: Insufficient documentation

## 2024-05-01 DIAGNOSIS — Z960 Presence of urogenital implants: Secondary | ICD-10-CM

## 2024-05-01 DIAGNOSIS — I700 Atherosclerosis of aorta: Secondary | ICD-10-CM | POA: Diagnosis not present

## 2024-05-01 DIAGNOSIS — I6522 Occlusion and stenosis of left carotid artery: Secondary | ICD-10-CM | POA: Diagnosis not present

## 2024-05-01 DIAGNOSIS — C8590 Non-Hodgkin lymphoma, unspecified, unspecified site: Secondary | ICD-10-CM

## 2024-05-01 DIAGNOSIS — M549 Dorsalgia, unspecified: Secondary | ICD-10-CM | POA: Insufficient documentation

## 2024-05-01 DIAGNOSIS — M160 Bilateral primary osteoarthritis of hip: Secondary | ICD-10-CM | POA: Insufficient documentation

## 2024-05-01 DIAGNOSIS — Z452 Encounter for adjustment and management of vascular access device: Secondary | ICD-10-CM | POA: Insufficient documentation

## 2024-05-01 DIAGNOSIS — I2510 Atherosclerotic heart disease of native coronary artery without angina pectoris: Secondary | ICD-10-CM | POA: Diagnosis not present

## 2024-05-01 DIAGNOSIS — Z01812 Encounter for preprocedural laboratory examination: Secondary | ICD-10-CM

## 2024-05-01 HISTORY — PX: IR IMAGING GUIDED PORT INSERTION: IMG5740

## 2024-05-01 LAB — CBC WITH DIFFERENTIAL/PLATELET
Abs Immature Granulocytes: 0.02 K/uL (ref 0.00–0.07)
Basophils Absolute: 0 K/uL (ref 0.0–0.1)
Basophils Relative: 0 %
Eosinophils Absolute: 0.2 K/uL (ref 0.0–0.5)
Eosinophils Relative: 3 %
HCT: 40.1 % (ref 36.0–46.0)
Hemoglobin: 13.2 g/dL (ref 12.0–15.0)
Immature Granulocytes: 0 %
Lymphocytes Relative: 40 %
Lymphs Abs: 2.5 K/uL (ref 0.7–4.0)
MCH: 30.7 pg (ref 26.0–34.0)
MCHC: 32.9 g/dL (ref 30.0–36.0)
MCV: 93.3 fL (ref 80.0–100.0)
Monocytes Absolute: 0.6 K/uL (ref 0.1–1.0)
Monocytes Relative: 10 %
Neutro Abs: 2.8 K/uL (ref 1.7–7.7)
Neutrophils Relative %: 47 %
Platelets: 192 K/uL (ref 150–400)
RBC: 4.3 MIL/uL (ref 3.87–5.11)
RDW: 13.1 % (ref 11.5–15.5)
WBC: 6.1 K/uL (ref 4.0–10.5)
nRBC: 0 % (ref 0.0–0.2)

## 2024-05-01 MED ORDER — OXYBUTYNIN CHLORIDE ER 10 MG PO TB24: 10.0000 mg | ORAL_TABLET | Freq: Every day | ORAL | 0 refills | Status: DC

## 2024-05-01 MED ORDER — LIDOCAINE HCL 1 % IJ SOLN
INTRAMUSCULAR | Status: AC
  Filled 2024-05-01: qty 20

## 2024-05-01 MED ORDER — ONDANSETRON HCL 8 MG PO TABS: 8.0000 mg | ORAL_TABLET | Freq: Three times a day (TID) | ORAL | 1 refills | Status: DC | PRN

## 2024-05-01 MED ORDER — LIDOCAINE HCL 1 % IJ SOLN
20.0000 mL | Freq: Once | INTRAMUSCULAR | Status: AC
  Administered 2024-05-01: 15 mL

## 2024-05-01 MED ORDER — HEPARIN SOD (PORK) LOCK FLUSH 100 UNIT/ML IV SOLN
INTRAVENOUS | Status: AC
  Filled 2024-05-01: qty 5

## 2024-05-01 MED ORDER — FENTANYL CITRATE (PF) 100 MCG/2ML IJ SOLN
INTRAMUSCULAR | Status: AC
  Filled 2024-05-01: qty 4

## 2024-05-01 MED ORDER — ALLOPURINOL 300 MG PO TABS: 300.0000 mg | ORAL_TABLET | Freq: Every day | ORAL | 3 refills | Status: DC

## 2024-05-01 MED ORDER — MIDAZOLAM HCL 2 MG/2ML IJ SOLN
INTRAMUSCULAR | Status: AC
  Filled 2024-05-01: qty 4

## 2024-05-01 MED ORDER — SODIUM CHLORIDE 0.9 % IV SOLN: INTRAVENOUS | Status: DC

## 2024-05-01 MED ORDER — MIDAZOLAM HCL (PF) 2 MG/2ML IJ SOLN
INTRAMUSCULAR | Status: AC | PRN
  Administered 2024-05-01 (×2): .5 mg via INTRAVENOUS

## 2024-05-01 MED ORDER — FENTANYL CITRATE (PF) 100 MCG/2ML IJ SOLN
INTRAMUSCULAR | Status: AC | PRN
  Administered 2024-05-01 (×2): 25 ug via INTRAVENOUS

## 2024-05-01 MED ORDER — LIDOCAINE-PRILOCAINE 2.5-2.5 % EX CREA: TOPICAL_CREAM | CUTANEOUS | 3 refills | Status: DC

## 2024-05-01 MED ORDER — CEFAZOLIN SODIUM-DEXTROSE 2-4 GM/100ML-% IV SOLN
2.0000 g | INTRAVENOUS | Status: AC
  Administered 2024-05-01: 2 g via INTRAVENOUS
  Filled 2024-05-01: qty 100

## 2024-05-01 MED ORDER — ACYCLOVIR 400 MG PO TABS: 400.0000 mg | ORAL_TABLET | Freq: Every day | ORAL | 3 refills | Status: DC

## 2024-05-01 MED ORDER — PROCHLORPERAZINE MALEATE 10 MG PO TABS: 10.0000 mg | ORAL_TABLET | Freq: Four times a day (QID) | ORAL | 6 refills | Status: DC | PRN

## 2024-05-01 MED ORDER — MIDAZOLAM HCL (PF) 2 MG/2ML IJ SOLN
INTRAMUSCULAR | Status: AC | PRN
  Administered 2024-05-01: .5 mg via INTRAVENOUS
  Administered 2024-05-01: 1 mg via INTRAVENOUS
  Administered 2024-05-01: .5 mg via INTRAVENOUS

## 2024-05-01 MED ORDER — FENTANYL CITRATE (PF) 100 MCG/2ML IJ SOLN
INTRAMUSCULAR | Status: AC | PRN
  Administered 2024-05-01: 25 ug via INTRAVENOUS
  Administered 2024-05-01: 50 ug via INTRAVENOUS
  Administered 2024-05-01: 25 ug via INTRAVENOUS

## 2024-05-01 MED ORDER — CEFAZOLIN SODIUM-DEXTROSE 2-4 GM/100ML-% IV SOLN
INTRAVENOUS | Status: AC
  Filled 2024-05-01: qty 100

## 2024-05-01 MED ORDER — PREDNISONE 20 MG PO TABS
100.0000 mg | ORAL_TABLET | Freq: Every day | ORAL | 5 refills | Status: DC
Start: 2024-05-01 — End: 2024-05-31

## 2024-05-01 MED ORDER — HEPARIN SOD (PORK) LOCK FLUSH 100 UNIT/ML IV SOLN
500.0000 [IU] | Freq: Once | INTRAVENOUS | Status: AC
Start: 2024-05-01 — End: 2024-05-01
  Administered 2024-05-01: 500 [IU] via INTRAVENOUS

## 2024-05-01 MED ORDER — TAMSULOSIN HCL 0.4 MG PO CAPS: 0.4000 mg | ORAL_CAPSULE | Freq: Every day | ORAL | 0 refills | Status: DC

## 2024-05-01 NOTE — Procedures (Signed)
 Interventional Radiology Procedure Note  Procedure: CT guided bone marrow aspiration and biopsy  Complications: None  EBL: < 10 mL  Findings: Aspirate and core biopsy performed of bone marrow in right iliac bone.  Plan: Bedrest supine x 1 hrs  Ericha Whittingham T. Fredia Sorrow, M.D Pager:  [phone]

## 2024-05-01 NOTE — Progress Notes (Signed)
 START ON PATHWAY REGIMEN - Lymphoma and CLL     A cycle is every 21 days:     Prednisone      Rituximab-xxxx      Cyclophosphamide      Doxorubicin      Vincristine   **Always confirm dose/schedule in your pharmacy ordering system**  Patient Characteristics: Diffuse Large B-Cell Lymphoma or Follicular Lymphoma, Grade 3B, First Line, Stage I and II, No Bulk Disease Type: Not Applicable Disease Type: Diffuse Large B-Cell Lymphoma Disease Type: Not Applicable Line of therapy: First Line Disease Characteristics: No Bulk Intent of Therapy: Curative Intent, Discussed with Patient

## 2024-05-01 NOTE — H&P (Signed)
 Chief Complaint:  Large B-Cell Lymphoma  Procedure: Bone Marrow Biopsy with Aspiration   Referring Provider(s): Dr. Zelphia Cap  Supervising Physician: Luverne Aran  Patient Status: [facility] - Out-pt  History of Present Illness: Stacey [name] is a 63 y.o. female with a history of chronic lower back pain who presented to the ED several times since the end of August for various complaints including worsening back pain with radiculopathy, abnormal labs, and most recently flank pain with constipation. Her latest ED workup on 9/27 included imaging that revealed an obstructing 5.9 x 4.5cm enhancing mass along the left pelvic sidewall causing left hydroureter, hydronephrosis, and nephromegaly...highly concerning for malignant neoplasm. She underwent cystoscopy with left double J stent insertion with Dr. Watt on 9/27 followed by pelvic sidewall mass biopsy with Dr. VEAR Lent on 9/29. Pathology returned concerning for large b-cell lymphoma. She followed up with oncology outpatient who recommended bone marrow biopsy for further evaluation. She has plans to undergo chemotherapy with request for port-a-catheter insertion following her biopsy.   Patient presents today with family at the bedside. States that she continues to have back pain that radiates down her left leg as well as some dysuria since her stent placement with urology. She denies any hematuria, foul odor of urine, fevers/chills, night sweats, nausea/vomiting, abdominal pain, chest pain, or shortness of breath. NPO since midnight. All questions and concerns answered at the bedside.   Patient is Full Code  Past Medical History:  Diagnosis Date   Arthritis    left shoulder, neck, lower back  (01/11/2018)   Chronic lower back pain    Family history of adverse reaction to anesthesia    Mother has nausea   GERD (gastroesophageal reflux disease)    PONV (postoperative nausea and vomiting)    Pre-diabetes    UTI (urinary tract  infection) 03/20/2021    Past Surgical History:  Procedure Laterality Date   APPENDECTOMY     CYSTOSCOPY W/ URETERAL STENT PLACEMENT Left 04/08/2024   Procedure: CYSTOSCOPY, WITH RETROGRADE PYELOGRAM AND URETERAL STENT INSERTION;  Surgeon: Watt Rush, MD;  Location: [facility] ORS;  Service: Urology;  Laterality: Left;   JOINT REPLACEMENT     TOTAL SHOULDER ARTHROPLASTY Left 01/11/2018   TOTAL SHOULDER ARTHROPLASTY Left 01/11/2018   Procedure: LEFT TOTAL SHOULDER ARTHROPLASTY;  Surgeon: Sharl Selinda Dover, MD;  Location: [facility] OR;  Service: Orthopedics;  Laterality: Left;  2.5 hrs   TOTAL SHOULDER ARTHROPLASTY Right 02/08/2020   Procedure: TOTAL SHOULDER ARTHROPLASTY;  Surgeon: Sharl Selinda Dover, MD;  Location: [facility] OR;  Service: Orthopedics;  Laterality: Right;  2.5 hrs RNFA    Allergies: Propofol   Medications: Prior to Admission medications   Medication Sig Start Date End Date Taking? Authorizing Provider  dexamethasone  (DECADRON ) 4 MG tablet Take 5 tablets (20 mg total) by mouth daily. 04/20/24   Cap Zelphia, MD  gabapentin  (NEURONTIN ) 100 MG capsule Take 1 capsule (100 mg total) by mouth 3 (three) times daily. 04/20/24   Cap Zelphia, MD  methocarbamol  (ROBAXIN ) 500 MG tablet Take 500 mg by mouth every 8 (eight) hours as needed for muscle spasms. 10/15/17   [provider]  omeprazole  (PRILOSEC) 20 MG capsule TAKE 1 CAPSULE (20 MG TOTAL) BY MOUTH 2 (TWO) TIMES DAILY BEFORE A MEAL. 01/11/24   Danis, Victory LITTIE MOULD, MD  oxyCODONE  (OXY IR/ROXICODONE ) 5 MG immediate release tablet Take 1 tablet (5 mg total) by mouth every 4 (four) hours as needed. 04/20/24   Cap Zelphia, MD  sodium  phosphate (FLEET) ENEM Place 133 mLs (1 enema total) rectally daily as needed for severe constipation. 04/08/24   Waymond Lorelle Cummins, MD     Family History  Problem Relation Age of Onset   Hypertension Mother    Heart disease Father    Heart attack Father    CAD Father    Colon cancer Neg Hx    Esophageal cancer Neg Hx     Rectal cancer Neg Hx    Stomach cancer Neg Hx     Social History   Socioeconomic History   Marital status: Married    Spouse name: Nesta Scaturro   Number of children: Not on file   Years of education: Not on file   Highest education level: Not on file  Occupational History   Not on file  Tobacco Use   Smoking status: Never   Smokeless tobacco: Never  Vaping Use   Vaping status: Never Used  Substance and Sexual Activity   Alcohol use: Yes    Comment: 01/11/2018 might have 1 drink/month   Drug use: Never   Sexual activity: Yes  Other Topics Concern   Not on file  Social History Narrative   Not on file   Social Drivers of Health   Financial Resource Strain: Low Risk  (03/30/2024)   Received from [facility]   Overall Financial Resource Strain (CARDIA)    Difficulty of Paying Living Expenses: Not hard at all  Food Insecurity: No Food Insecurity (04/20/2024)   Hunger Vital Sign    Worried About Running Out of Food in the Last Year: Never true    Ran Out of Food in the Last Year: Never true  Transportation Needs: No Transportation Needs (04/20/2024)   PRAPARE - Administrator, Civil Service (Medical): No    Lack of Transportation (Non-Medical): No  Physical Activity: Not on file  Stress: Not on file  Social Connections: Not on file     Review of Systems  Genitourinary:  Positive for dysuria.  Musculoskeletal:  Positive for back pain (with left sided radiculopathy).  Patient denies any headache, chest pain, shortness of breath, abdominal pain, N/V, or fever/chills. All other systems are negative.   Vital Signs: BP (!) 158/87   Pulse 68   Temp 98.2 F (36.8 C) (Oral)   Resp 10   Ht 5' 4 (1.626 m)   Wt 166 lb (75.3 kg)   SpO2 96%   BMI 28.49 kg/m    Physical Exam Vitals reviewed.  Constitutional:      Appearance: Normal appearance.  HENT:     Head: Normocephalic and atraumatic.     Mouth/Throat:     Mouth: Mucous membranes  are moist.     Pharynx: Oropharynx is clear.  Cardiovascular:     Rate and Rhythm: Normal rate and regular rhythm.  Pulmonary:     Effort: Pulmonary effort is normal.     Breath sounds: Normal breath sounds.  Abdominal:     General: Abdomen is flat.     Palpations: Abdomen is soft.     Tenderness: There is abdominal tenderness (minimal suprapubic tenderness).  Musculoskeletal:        General: Normal range of motion.     Cervical back: Normal range of motion.  Skin:    General: Skin is warm and dry.  Neurological:     Mental Status: She is alert and oriented to person, place, and time.  Psychiatric:  Behavior: Behavior normal.     Imaging: ECHOCARDIOGRAM COMPLETE Result Date: 04/26/2024    ECHOCARDIOGRAM REPORT   Patient Name:   DELBERT VU Date of Exam: 04/26/2024 Medical Rec #:  [id number]          Height:       64.0 in Accession #:    [id number]         Weight:       170.3 lb Date of Birth:  16-Dec-1960          BSA:          1.827 m Patient Age:    63 years           BP:           121/67 mmHg Patient Gender: F                  HR:           54 bpm. Exam Location:  [facility] Procedure: 2D Echo, 3D Echo, Cardiac Doppler, Color Doppler and Strain Analysis            (Both Spectral and Color Flow Doppler were utilized during            procedure). Indications:     Chemo Z09  History:         Patient has no prior history of Echocardiogram examinations.  Sonographer:     Thea Norlander RCS Referring Phys:  [id number] ZHOU YU Diagnosing Phys: Evalene Lunger MD IMPRESSIONS  1. Left ventricular ejection fraction, by estimation, is 55 to 60%. The left ventricle has normal function. The left ventricle has no regional wall motion abnormalities. Left ventricular diastolic parameters are consistent with Grade I diastolic dysfunction (impaired relaxation). The average left ventricular global longitudinal strain is -19.7 %. The global longitudinal strain is normal.  2. Right ventricular systolic  function is normal. The right ventricular size is normal.  3. The mitral valve is normal in structure. Mild mitral valve regurgitation. No evidence of mitral stenosis.  4. The aortic valve is normal in structure. Aortic valve regurgitation is not visualized. No aortic stenosis is present.  5. The inferior vena cava is normal in size with greater than 50% respiratory variability, suggesting right atrial pressure of 3 mmHg. FINDINGS  Left Ventricle: Left ventricular ejection fraction, by estimation, is 55 to 60%. The left ventricle has normal function. The left ventricle has no regional wall motion abnormalities. The average left ventricular global longitudinal strain is -19.7 %. Strain was performed and the global longitudinal strain is normal. The left ventricular internal cavity size was normal in size. There is no left ventricular hypertrophy. Left ventricular diastolic parameters are consistent with Grade I diastolic dysfunction (impaired relaxation). Right Ventricle: The right ventricular size is normal. No increase in right ventricular wall thickness. Right ventricular systolic function is normal. Left Atrium: Left atrial size was normal in size. Right Atrium: Right atrial size was normal in size. Pericardium: There is no evidence of pericardial effusion. Mitral Valve: The mitral valve is normal in structure. Mild mitral valve regurgitation. No evidence of mitral valve stenosis. Tricuspid Valve: The tricuspid valve is normal in structure. Tricuspid valve regurgitation is trivial. No evidence of tricuspid stenosis. Aortic Valve: The aortic valve is normal in structure. Aortic valve regurgitation is not visualized. No aortic stenosis is present. Aortic valve peak gradient measures 5.2 mmHg. Pulmonic Valve: The pulmonic valve was normal in structure. Pulmonic valve regurgitation is not visualized. No evidence of  pulmonic stenosis. Aorta: The aortic root is normal in size and structure. Venous: The inferior vena  cava is normal in size with greater than 50% respiratory variability, suggesting right atrial pressure of 3 mmHg. IAS/Shunts: No atrial level shunt detected by color flow Doppler. Additional Comments: 3D was performed not requiring image post processing on an independent workstation and was indeterminate.  LEFT VENTRICLE PLAX 2D LVIDd:         4.10 cm   Diastology LVIDs:         3.10 cm   LV e' medial:  6.96 cm/s LV PW:         0.80 cm   LV e' lateral: 9.14 cm/s LV IVS:        0.70 cm LVOT diam:     2.40 cm   2D Longitudinal Strain LV SV:         79        2D Strain GLS Avg:     -19.7 % LV SV Index:   43 LVOT Area:     4.52 cm                           3D Volume EF:                          3D EF:        58 %                          LV EDV:       129 ml                          LV ESV:       55 ml                          LV SV:        74 ml RIGHT VENTRICLE             IVC RV S prime:     13.20 cm/s  IVC diam: 1.20 cm TAPSE (M-mode): 2.3 cm LEFT ATRIUM             Index        RIGHT ATRIUM           Index LA diam:        3.30 cm 1.81 cm/m   RA Area:     13.40 cm LA Vol (A2C):   35.3 ml 19.32 ml/m  RA Volume:   27.80 ml  15.21 ml/m LA Vol (A4C):   20.4 ml 11.16 ml/m LA Biplane Vol: 28.9 ml 15.82 ml/m  AORTIC VALVE AV Area (Vmax): 3.59 cm AV Vmax:        114.00 cm/s AV Peak Grad:   5.2 mmHg LVOT Vmax:      90.40 cm/s LVOT Vmean:     56.100 cm/s LVOT VTI:       0.175 m  AORTA Ao Root diam: 3.10 cm Ao Asc diam:  3.30 cm TRICUSPID VALVE TR Peak grad:   7.7 mmHg TR Vmax:        139.00 cm/s  SHUNTS Systemic VTI:  0.18 m Systemic Diam: 2.40 cm Evalene Lunger MD Electronically signed by Evalene Lunger MD Signature Date/Time: 04/26/2024/2:00:19 PM    Final    NM  PET Image Initial (PI) Skull Base To Thigh (F-18 FDG) Result Date: 04/25/2024 CLINICAL DATA:  Initial treatment strategy for new diagnosis of lymphoma. Known left pelvic soft tissue mass. EXAM: NUCLEAR MEDICINE PET SKULL BASE TO THIGH TECHNIQUE: 9.4 mCi  F-18 FDG was injected intravenously. Full-ring PET imaging was performed from the skull base to thigh after the radiotracer. CT data was obtained and used for attenuation correction and anatomic localization. Fasting blood glucose: 86 mg/dl COMPARISON:  Abdominopelvic CTA and CT of 04/08/2024 FINDINGS: Mediastinal blood pool activity: SUV max 3.5 Liver activity: SUV max 4.0 NECK: Extensive hypermetabolic brown fat throughout the neck and upper chest. Given this limitation, no cervical nodal hypermetabolism identified. Incidental CT findings: Left carotid atherosclerosis. No cervical adenopathy. CHEST: No pulmonary parenchymal or thoracic nodal hypermetabolism. Incidental CT findings: Aortic and coronary artery calcification. Tiny right pleural effusion is new since the prior CTs. ABDOMEN/PELVIS: Mild splenic hypermetabolism relative to the liver (SUV 4.6). No splenomegaly. The posterior left pelvic sidewall mass on prior CTA is hypermetabolic. Difficult to measure on noncontrast CT, estimated at 4.5 x 3.4 cm and a S.U.V. max of 14.1 on 127/6. No separate areas of abdominopelvic nodal hypermetabolism. Incidental CT findings: Left ureteric stent in place, without significant hydronephrosis. SKELETON: No abnormal marrow activity. Incidental CT findings: Mild degenerative changes of both hips. Bilateral shoulder arthroplasties. IMPRESSION: 1. The posterior left pelvic sidewall mass is hypermetabolic, consistent with active lymphoma. 2. Mild splenic hypermetabolism relative to the liver. No splenomegaly. Cannot exclude lymphomatous involvement. 3. New small right pleural effusion. 4. No active lymphoma within the chest or neck. Mildly decreased sensitivity exam secondary to hypermetabolic brown fat. 5. Age advanced coronary artery atherosclerosis. Recommend assessment of coronary risk factors. 6.  Aortic Atherosclerosis (ICD10-I70.0). Electronically Signed   By: Rockey Kilts M.D.   On: 04/25/2024 08:59   CT  ABDOMINAL MASS BIOPSY Result Date: 04/10/2024 INDICATION: 63 year old female with a newly diagnosed infiltrative mass along the left pelvic sidewall. EXAM: CT-guided core biopsy of deep soft tissue mass MEDICATIONS: None. ANESTHESIA/SEDATION: Moderate (conscious) sedation was employed during this procedure. A total of Versed  1 mg and Fentanyl  50 mcg was administered intravenously by the radiology nurse. Total intra-service moderate Sedation Time: 10 minutes. The patient's level of consciousness and vital signs were monitored continuously by radiology nursing throughout the procedure under my direct supervision. COMPLICATIONS: None immediate. PROCEDURE: Informed written consent was obtained from the patient after a thorough discussion of the procedural risks, benefits and alternatives. All questions were addressed. Maximal Sterile Barrier Technique was utilized including caps, mask, sterile gowns, sterile gloves, sterile drape, hand hygiene and skin antiseptic. A timeout was performed prior to the initiation of the procedure. A CT scan was performed identifying the ill-defined mass along the left pelvic sidewall. A suitable skin entry site was selected and marked. The skin was sterilely prepped and draped in standard fashion using chlorhexidine  skin prep. Local anesthesia was attained by infiltration with 1% lidocaine . A small dermatotomy was made. A 17 gauge introducer needle was carefully advanced through the gluteal musculature and into the margin of the mass. Needle positioning was cross reference with the prior CTA of the abdomen and pelvis to ensure trajectory of the biopsy passes with not cross the internal iliac vessels. Several 18 gauge core biopsies were then obtained using the bio Pince automated biopsy device. Biopsies were placed in saline and delivered to pathology for further analysis. Post biopsy CT imaging demonstrates no evidence of immediate complication. IMPRESSION: CT-guided core biopsy  of  infiltrative left pelvic sidewall mass. Electronically Signed   By: Wilkie Lent M.D.   On: 04/10/2024 13:00   DG OR UROLOGY CYSTO IMAGE ([facility] ONLY) Result Date: 04/08/2024 There is no interpretation for this exam.  This order is for images obtained during a surgical procedure.  Please See Surgeries Tab for more information regarding the procedure.   CT Angio Abd/Pel W and/or Wo Contrast Result Date: 04/08/2024 EXAM: CTA ABDOMEN AND PELVIS WITH AND WITHOUT CONTRAST 04/08/2024 07:28:48 AM TECHNIQUE: CTA images of the abdomen and pelvis without and with intravenous contrast. Three-dimensional MIP/volume rendered formations were performed. Automated exposure control, iterative reconstruction, and/or weight based adjustment of the mA/kV was utilized to reduce the radiation dose to as low as reasonably achievable. 100 mL iohexol (OMNIPAQUE) 350 MG/ML injection was administered. COMPARISON: CT from earlier today. CLINICAL HISTORY: Mesenteric ischemia, acute; left back, flank LLQ pain, assess for AAS. Left flank pain radiating to stomach. Constipation. FINDINGS: VASCULATURE: No acute finding. No occlusion or significant stenosis. AORTA: No acute finding. No abdominal aortic aneurysm. No dissection. CELIAC TRUNK: No acute finding. No occlusion or significant stenosis. SUPERIOR MESENTERIC ARTERY: No acute finding. No occlusion or significant stenosis. RENAL ARTERIES: No acute finding. No occlusion or significant stenosis. ILIAC ARTERIES: No acute finding. No occlusion or significant stenosis. LIVER: The liver is unremarkable. GALLBLADDER AND BILE DUCTS: Gallbladder is unremarkable. No biliary ductal dilatation. SPLEEN: The spleen is unremarkable. PANCREAS: The pancreas is unremarkable. ADRENAL GLANDS: Bilateral adrenal glands demonstrate no acute abnormality. KIDNEYS, URETERS AND BLADDER: Left-sided nephromegaly, hydronephrosis with delayed nephrograms. Left-sided hydroureter extends into the pelvis and is  obstructed distally by a left pelvic sidewall enhancing mass. The mass measures 5.9 x 4.5 cm (image 164/9). The mass abuts the left adnexa (image 166/9) with loss of a fat plane between these 2 structures. No stones in the kidneys or ureters. No perinephric or periureteral stranding. Urinary bladder is unremarkable. GI AND BOWEL: Stomach appears nondilated. No pathologic dilatation of the large or small bowel loops. Moderate retained stool within the ascending colon. There are no secondary signs of bowel ischemia. REPRODUCTIVE: Uterus appears normal. Right adnexa is within normal limits. The left adnexa is abutts the left pelvic sidewall enhancing mass as described under KIDNEYS, URETERS AND BLADDER. PERITONEUM AND RETRPERITONEUM: No ascites or free air. No focal fluid collections. LUNG BASE: No acute abnormality. LYMPH NODES: No lymphadenopathy. BONES AND SOFT TISSUES: No acute abnormality of the bones. No aggressive lytic or sclerotic bone lesion. No acute soft tissue abnormality. IMPRESSION: 1. Obstructing 5.9 x 4.5 cm enhancing mass along the left pelvic sidewall causing left hydroureter, hydronephrosis, delayed nephrogram, and nephromegaly; mass abuts the left adnexa with loss of the intervening fat plane. This is highly concerning for malignant neoplasm. Etiology indeterminate. It's possible that this could be arising from a primary urothelial neoplasm of the left ureter. Other primary neoplasms or metastatic disease not excluded. Correlation with tissue sampling is advised. 2. Patent abdominal vasculature without signs of mesenteric ischemia. Electronically signed by: Waddell Calk MD 04/08/2024 07:40 AM EDT RP Workstation: [id number]   CT Renal Stone Study Result Date: 04/08/2024 EXAM: CT UROGRAM 04/08/2024 02:22:00 AM TECHNIQUE: CT of the abdomen and pelvis was performed without the administration of intravenous contrast as per CT urogram protocol. Multiplanar reformatted images as well as MIP urogram  images are provided for review. Automated exposure control, iterative reconstruction, and/or weight based adjustment of the mA/kV was utilized to reduce the radiation dose to as low as  reasonably achievable. COMPARISON: None available. CLINICAL HISTORY: Abdominal/flank pain, stone suspected. POV from home for left flank pain radiating into left lower abdomen x 3 days and constipation since Sunday. Denies dysuria or hematuria. FINDINGS: LOWER CHEST: No acute abnormality. LIVER: The liver is unremarkable. GALLBLADDER AND BILE DUCTS: Gallbladder is unremarkable. No biliary ductal dilatation. SPLEEN: No acute abnormality. PANCREAS: No acute abnormality. ADRENAL GLANDS: No acute abnormality. KIDNEYS, URETERS AND BLADDER: Mild left hydroureteronephrosis. Hazy stranding about the left renal pelvis and proximal left ureter. No urinary calculi. Differential considerations include recently passed left stone or left pyelonephritis. Urinary bladder is unremarkable. GI AND BOWEL: Stomach demonstrates no acute abnormality. There is no bowel obstruction. PERITONEUM AND RETROPERITONEUM: No ascites. No free air. VASCULATURE: Aorta is normal in caliber. LYMPH NODES: No lymphadenopathy. REPRODUCTIVE ORGANS: No acute abnormality. BONES AND SOFT TISSUES: No acute osseous abnormality. No focal soft tissue abnormality. IMPRESSION: 1. Mild left hydroureteronephrosis with periureteral/peripelvic stranding, no urinary calculi. 2. Imaging findings are compatible with recently passed left ureteral stone versus acute left pyelonephritis. Electronically signed by: Norman Gatlin MD 04/08/2024 03:20 AM EDT RP Workstation: [id number]    Labs:  CBC: Recent Labs    04/08/24 0200 04/09/24 0436 04/20/24 1032 05/01/24 0838  WBC 10.5 8.3 7.4 6.1  HGB 13.6 12.3 13.1 13.2  HCT 40.2 36.7 38.5 40.1  PLT 308 237 177 192    COAGS: Recent Labs    04/09/24 1121  INR 1.0    BMP: Recent Labs    04/08/24 0200 04/09/24 0436  04/20/24 1032  NA 137 137 133*  K 4.7 4.7 4.0  CL 102 105 100  CO2 26 25 24   GLUCOSE 112* 160* 120*  BUN 21 17 9   CALCIUM 8.7* 8.3* 8.9  CREATININE 1.33* 0.70 0.68  GFRNONAA 46* >60 >60    LIVER FUNCTION TESTS: Recent Labs    04/08/24 0200 04/20/24 1032  BILITOT 0.7 0.8  AST 19 16  ALT 14 9  ALKPHOS 68 63  PROT 7.6 7.6  ALBUMIN 3.5 3.7    TUMOR MARKERS: No results for input(s): AFPTM, CEA, CA199, CHROMGRNA in the last 8760 hours.  Assessment and Plan:  Large B-Cell Lymphoma: Stacey [name] is a 63 y.o. female with a recent diagnosis of large bb-cell lymphoma who presents to [facility] department for an image-guided bone marrow biopsy and port-a-catheter insertion with Dr. KANDICE Moan. Procedure to be performed under moderate sedation.  -Ancef  ordered for prophylaxis prior to port-a-catheter insertion  Risks and benefits of bone marrow biopsy with aspiration was discussed with the patient and/or patient's family including, but not limited to bleeding, infection, damage to adjacent structures or low yield requiring additional tests.  Risks and benefits of image guided port-a-catheter placement was discussed with the patient including, but not limited to bleeding, infection, pneumothorax, or fibrin sheath development and need for additional procedures.  All of the patient's questions were answered, patient is agreeable to proceed. Consent signed and in chart.   Thank you for allowing our service to participate in Mescal Johnae Friley 's care.    Electronically Signed: Glennon CHRISTELLA Bal, PA-C   05/01/2024, 9:37 AM     I spent a total of  30 Minutes in face to face in clinical consultation, greater than 50% of which was counseling/coordinating care for bone marrow biopsy.

## 2024-05-01 NOTE — Telephone Encounter (Signed)
 Pt LM on triage line stating that she is having pain she believes is related to her stent. Pain is worse after voiding. Currently pain is 10/10. Per previous telephone note she was instructed to to take ibuprofen 600mg  despite Norco not working. She denies hematuria, fever or chills.   Advised patient on normal stent precautions and normal stent symptoms. Sent her tips on how to tolerate a stent via mychart. Rx for oxybutynin and flomax sent in to help with bladder spasm and stent discomfort. Patient voiced understanding. Approval for RX per Dr. Francisca. Patient received oxycodone  today from oncologist for pain and she states that this has helped her pain.

## 2024-05-01 NOTE — Procedures (Signed)
 Interventional Radiology Procedure Note  Procedure: Single Lumen Power Port Placement    Access:  Right IJ vein.  Findings: Catheter tip positioned at SVC/RA junction. Port is ready for immediate use.   Complications: None  EBL: < 10 mL  Recommendations:  - Ok to shower in 24 hours - Do not submerge for 7 days - Routine line care   Maxi Carreras T. Fredia Sorrow, M.D Pager:  [phone]

## 2024-05-02 ENCOUNTER — Inpatient Hospital Stay

## 2024-05-02 ENCOUNTER — Other Ambulatory Visit: Payer: Self-pay

## 2024-05-02 NOTE — Progress Notes (Signed)
 [facility] CSW Progress Note  Clinical Social Work introduced self to patient during Patient Education with Stacey [name], Charity fundraiser.  Provided information regarding CSW role, including counseling, advanced care planning and support group.  Answered questions as needed.  Follow Up Plan:  CSW will follow-up with patient by phone     Macario CHRISTELLA Au, LCSW Clinical Social Worker [facility]

## 2024-05-03 ENCOUNTER — Other Ambulatory Visit: Payer: Self-pay | Admitting: *Deleted

## 2024-05-03 NOTE — Telephone Encounter (Signed)
 Patient says that she needs some more oxycodone  and her level is 10 and above.  She says she has 8 pills and would like to have a refill so she does not want to run out.

## 2024-05-04 ENCOUNTER — Telehealth: Payer: Self-pay | Admitting: *Deleted

## 2024-05-04 ENCOUNTER — Other Ambulatory Visit: Payer: Self-pay | Admitting: Oncology

## 2024-05-04 LAB — SURGICAL PATHOLOGY

## 2024-05-04 MED ORDER — OXYCODONE HCL 5 MG PO TABS: 5.0000 mg | ORAL_TABLET | ORAL | 0 refills | Status: DC | PRN

## 2024-05-04 NOTE — Telephone Encounter (Signed)
 Patient called that she is does not have any more oxycodone  and she is still with level 10 on the left side have the doctor to please send in the oxycodone  for her.

## 2024-05-05 ENCOUNTER — Telehealth: Payer: Self-pay | Admitting: *Deleted

## 2024-05-05 ENCOUNTER — Other Ambulatory Visit: Payer: Self-pay | Admitting: Oncology

## 2024-05-05 NOTE — Telephone Encounter (Signed)
 I asked her what medications that she is talking about: Acyclovir , and she takes that every day; allopurinol daily. I told her about her with nausea meds zofran  and prochlorperazine and they can use the meds when she has the nausea.  Told her to start with 1 whichever 1 you wanted if you have nausea use it for a day and if it is not helping with the nausea then switch to the other nausea  medicine. I want to see Stacey [name] about the prednisone ( 5 tablets to take  on 2-5 day .and there dexamethasone  says 5 tablets daily.  Can you help me about the prednisone  and dexamethasone   Then I can call patient back

## 2024-05-05 NOTE — Telephone Encounter (Signed)
 Per Dr. Babara, Dex was only a one time dose. She may start on Allopurinol now and hold off other meds. Spoke to pt and informed her of this. Advised her to bring meds to visit so we can review directions with her. Pt agreeable.

## 2024-05-08 ENCOUNTER — Encounter: Payer: Self-pay | Admitting: Oncology

## 2024-05-09 MED FILL — Fosaprepitant Dimeglumine For IV Infusion 150 MG (Base Eq): INTRAVENOUS | Qty: 5 | Status: AC

## 2024-05-10 ENCOUNTER — Encounter (HOSPITAL_COMMUNITY): Payer: Self-pay | Admitting: Oncology

## 2024-05-10 ENCOUNTER — Inpatient Hospital Stay

## 2024-05-10 ENCOUNTER — Inpatient Hospital Stay: Admitting: Oncology

## 2024-05-10 ENCOUNTER — Encounter: Payer: Self-pay | Admitting: Oncology

## 2024-05-10 VITALS — BP 119/72 | HR 74 | Temp 97.4°F | Resp 18

## 2024-05-10 VITALS — BP 113/92 | HR 110 | Temp 98.1°F | Resp 16 | Wt 169.0 lb

## 2024-05-10 DIAGNOSIS — Z5112 Encounter for antineoplastic immunotherapy: Secondary | ICD-10-CM | POA: Diagnosis not present

## 2024-05-10 DIAGNOSIS — C8330 Diffuse large B-cell lymphoma, unspecified site: Secondary | ICD-10-CM

## 2024-05-10 DIAGNOSIS — Z5111 Encounter for antineoplastic chemotherapy: Secondary | ICD-10-CM | POA: Insufficient documentation

## 2024-05-10 DIAGNOSIS — G893 Neoplasm related pain (acute) (chronic): Secondary | ICD-10-CM | POA: Insufficient documentation

## 2024-05-10 LAB — CBC WITH DIFFERENTIAL (CANCER CENTER ONLY)
Abs Immature Granulocytes: 0.03 K/uL (ref 0.00–0.07)
Basophils Absolute: 0 K/uL (ref 0.0–0.1)
Basophils Relative: 0 %
Eosinophils Absolute: 0.1 K/uL (ref 0.0–0.5)
Eosinophils Relative: 1 %
HCT: 38.1 % (ref 36.0–46.0)
Hemoglobin: 12.9 g/dL (ref 12.0–15.0)
Immature Granulocytes: 1 %
Lymphocytes Relative: 30 %
Lymphs Abs: 1.7 K/uL (ref 0.7–4.0)
MCH: 30.9 pg (ref 26.0–34.0)
MCHC: 33.9 g/dL (ref 30.0–36.0)
MCV: 91.4 fL (ref 80.0–100.0)
Monocytes Absolute: 0.5 K/uL (ref 0.1–1.0)
Monocytes Relative: 8 %
Neutro Abs: 3.5 K/uL (ref 1.7–7.7)
Neutrophils Relative %: 60 %
Platelet Count: 180 K/uL (ref 150–400)
RBC: 4.17 MIL/uL (ref 3.87–5.11)
RDW: 13 % (ref 11.5–15.5)
WBC Count: 5.8 K/uL (ref 4.0–10.5)
nRBC: 0 % (ref 0.0–0.2)

## 2024-05-10 LAB — CMP (CANCER CENTER ONLY)
ALT: 10 U/L (ref 0–44)
AST: 18 U/L (ref 15–41)
Albumin: 3.6 g/dL (ref 3.5–5.0)
Alkaline Phosphatase: 62 U/L (ref 38–126)
Anion gap: 12 (ref 5–15)
BUN: 15 mg/dL (ref 8–23)
CO2: 24 mmol/L (ref 22–32)
Calcium: 8.9 mg/dL (ref 8.9–10.3)
Chloride: 98 mmol/L (ref 98–111)
Creatinine: 0.67 mg/dL (ref 0.44–1.00)
GFR, Estimated: >60 mL/min (ref >60)
Glucose, Bld: 125 mg/dL — ABNORMAL HIGH (ref 70–99)
Potassium: 3.7 mmol/L (ref 3.5–5.1)
Sodium: 134 mmol/L — ABNORMAL LOW (ref 135–145)
Total Bilirubin: 0.7 mg/dL (ref 0.0–1.2)
Total Protein: 7.4 g/dL (ref 6.5–8.1)

## 2024-05-10 MED ORDER — SODIUM CHLORIDE 0.9 % IV SOLN
INTRAVENOUS | Status: DC
  Filled 2024-05-10: qty 250

## 2024-05-10 MED ORDER — DEXAMETHASONE SOD PHOSPHATE PF 10 MG/ML IJ SOLN
10.0000 mg | Freq: Once | INTRAMUSCULAR | Status: AC
  Administered 2024-05-10: 10 mg via INTRAVENOUS

## 2024-05-10 MED ORDER — SODIUM CHLORIDE 0.9 % IV SOLN
150.0000 mg | Freq: Once | INTRAVENOUS | Status: AC
  Administered 2024-05-10: 150 mg via INTRAVENOUS
  Filled 2024-05-10: qty 150

## 2024-05-10 MED ORDER — ACETAMINOPHEN 325 MG PO TABS
650.0000 mg | ORAL_TABLET | Freq: Once | ORAL | Status: AC
  Administered 2024-05-10: 650 mg via ORAL
  Filled 2024-05-10: qty 2

## 2024-05-10 MED ORDER — PALONOSETRON HCL INJECTION 0.25 MG/5ML
0.2500 mg | Freq: Once | INTRAVENOUS | Status: AC
  Administered 2024-05-10: 0.25 mg via INTRAVENOUS
  Filled 2024-05-10: qty 5

## 2024-05-10 MED ORDER — SODIUM CHLORIDE 0.9 % IV SOLN
375.0000 mg/m2 | Freq: Once | INTRAVENOUS | Status: AC
  Administered 2024-05-10: 700 mg via INTRAVENOUS
  Filled 2024-05-10: qty 20

## 2024-05-10 MED ORDER — OXYCODONE HCL ER 15 MG PO T12A: 15.0000 mg | EXTENDED_RELEASE_TABLET | Freq: Two times a day (BID) | ORAL | 0 refills | Status: DC

## 2024-05-10 MED ORDER — DIPHENHYDRAMINE HCL 25 MG PO CAPS
50.0000 mg | ORAL_CAPSULE | Freq: Once | ORAL | Status: AC
  Administered 2024-05-10: 50 mg via ORAL
  Filled 2024-05-10: qty 2

## 2024-05-10 MED ORDER — SODIUM CHLORIDE 0.9 % IV SOLN
750.0000 mg/m2 | Freq: Once | INTRAVENOUS | Status: AC
  Administered 2024-05-10: 1500 mg via INTRAVENOUS
  Filled 2024-05-10: qty 75

## 2024-05-10 MED ORDER — DOXORUBICIN HCL CHEMO IV INJECTION 2 MG/ML
50.0000 mg/m2 | Freq: Once | INTRAVENOUS | Status: AC
  Administered 2024-05-10: 92 mg via INTRAVENOUS
  Filled 2024-05-10: qty 46

## 2024-05-10 MED ORDER — VINCRISTINE SULFATE CHEMO INJECTION 1 MG/ML
2.0000 mg | Freq: Once | INTRAVENOUS | Status: AC
  Administered 2024-05-10: 2 mg via INTRAVENOUS
  Filled 2024-05-10: qty 2

## 2024-05-10 NOTE — Patient Instructions (Signed)
 [facility] - A DEPT OF [facility]  Discharge Instructions: Thank you for choosing [facility] to provide your oncology and hematology care.  If you have a lab appointment with the Cancer Center, please go directly to the Cancer Center and check in at the registration area.  Wear comfortable clothing and clothing appropriate for easy access to any Portacath or PICC line.   We strive to give you quality time with your provider. You may need to reschedule your appointment if you arrive late (15 or more minutes).  Arriving late affects you and other patients whose appointments are after yours.  Also, if you miss three or more appointments without notifying the office, you may be dismissed from the clinic at the provider's discretion.      For prescription refill requests, have your pharmacy contact our office and allow 72 hours for refills to be completed.    Today you received the following chemotherapy and/or immunotherapy agents rituximab/adriamycin/cytoxan/vincristine      To help prevent nausea and vomiting after your treatment, we encourage you to take your nausea medication as directed.  BELOW ARE SYMPTOMS THAT SHOULD BE REPORTED IMMEDIATELY: *FEVER GREATER THAN 100.4 F (38 C) OR HIGHER *CHILLS OR SWEATING *NAUSEA AND VOMITING THAT IS NOT CONTROLLED WITH YOUR NAUSEA MEDICATION *UNUSUAL SHORTNESS OF BREATH *UNUSUAL BRUISING OR BLEEDING *URINARY PROBLEMS (pain or burning when urinating, or frequent urination) *BOWEL PROBLEMS (unusual diarrhea, constipation, pain near the anus) TENDERNESS IN MOUTH AND THROAT WITH OR WITHOUT PRESENCE OF ULCERS (sore throat, sores in mouth, or a toothache) UNUSUAL RASH, SWELLING OR PAIN  UNUSUAL VAGINAL DISCHARGE OR ITCHING   Items with * indicate a potential emergency and should be followed up as soon as possible or go to the Emergency Department if any problems should occur.  Please show the CHEMOTHERAPY  ALERT CARD or IMMUNOTHERAPY ALERT CARD at check-in to the Emergency Department and triage nurse.  Should you have questions after your visit or need to cancel or reschedule your appointment, please contact [facility] - A DEPT OF [facility]  [phone] and follow the prompts.  Office hours are 8:00 a.m. to 4:30 p.m. Monday - Friday. Please note that voicemails left after 4:00 p.m. may not be returned until the following business day.  We are closed weekends and major holidays. You have access to a nurse at all times for urgent questions. Please call the main number to the clinic [phone] and follow the prompts.  For any non-urgent questions, you may also contact your provider using MyChart. We now offer e-Visits for anyone 15 and older to request care online for non-urgent symptoms. For details visit [email or web address].   Also download the MyChart app! Go to the app store, search MyChart, open the app, select [place], and log in with your MyChart username and password.

## 2024-05-10 NOTE — Assessment & Plan Note (Signed)
 Chemotherapy treatment as planned above

## 2024-05-10 NOTE — Assessment & Plan Note (Addendum)
 DLBCL of left pelvic wall mass, Atypical t (8; 14), BCL2 BCL6 monosomy.  Negative for BCL2, BCL6 and MYC rearrangement PET scan evaluation showed mild hypermetabolism in spleen.  Nonspecific.  Likely stage I/II disease. Bone marrow biopsy is negative.   IPI score 1-2 Baseline Echo showed LVEF 55-60% Grade I diastolic dysfunction   Recommend systemic chemotherapy R [facility] x 3.   Labs are reviewed and discussed with patient. Proceed with cycle 1 R [facility] with D3 GCSF.  Rational and side effects of GCSF was discussed. Recommend Claritin 10mg  daily.  Discussed about D2-D5 prednisone  100mg  daily.  Discussed about antiemetics instructions.   Recommend Allopurinol 300mg  daily. Acyclovir 400mg  daily for prophylaxis.

## 2024-05-10 NOTE — Progress Notes (Signed)
 Hematology/Oncology Consult Note [phone] [phone]   CHIEF COMPLAINTS/PURPOSE OF CONSULTATION:  DLBCL   ASSESSMENT & PLAN:   Cancer Staging  Diffuse large B-cell lymphoma (HCC) Staging form: Hodgkin and Non-Hodgkin Lymphoma, AJCC 8th Edition - Clinical stage from 04/26/2024: Stage II (Diffuse large B-cell lymphoma) - Signed by Babara Call, MD on 04/26/2024   Diffuse large B-cell lymphoma (HCC) DLBCL of left pelvic wall mass, Atypical t (8; 14), BCL2 BCL6 monosomy.  Negative for BCL2, BCL6 and MYC rearrangement PET scan evaluation showed mild hypermetabolism in spleen.  Nonspecific.  Likely stage I/II disease. Bone marrow biopsy is negative.   IPI score 1-2 Baseline Echo showed LVEF 55-60% Grade I diastolic dysfunction   Recommend systemic chemotherapy R CHOP x 3.   Labs are reviewed and discussed with patient. Proceed with cycle 1 R CHOP with D3 GCSF.  Rational and side effects of GCSF was discussed. Recommend Claritin 10mg  daily.  Discussed about D2-D5 prednisone  100mg  daily.  Discussed about antiemetics instructions.   Recommend Allopurinol 300mg  daily. Acyclovir 400mg  daily for prophylaxis.   Neoplasm related pain Continue Oxycodone  5mg  Q4h PRN.  Add Oxycontin  15mg  Q12 hour.  Continue Gabapnetin 100mg  TID  Encounter for antineoplastic chemotherapy Chemotherapy treatment as planned above   Orders Placed This Encounter  Procedures   CBC with Differential (Cancer Center Only)    Standing Status:   Future    Expected Date:   05/17/2024    Expiration Date:   08/15/2024   CMP (Cancer Center only)    Standing Status:   Future    Expected Date:   05/17/2024    Expiration Date:   08/15/2024   Follow up in 1 week All questions were answered. The patient knows to call the clinic with any problems, questions or concerns.  Call Babara, MD, PhD [facility] Hematology Oncology 05/10/2024    HISTORY OF PRESENTING ILLNESS:  Stacey [name] 63 y.o. female  presents to establish care for large B-cell lymphoma I have reviewed her chart and materials related to her cancer extensively and collaborated history with the patient. Summary of oncologic history is as follows: Oncology History  Diffuse large B-cell lymphoma (HCC)  04/08/2024 Imaging   CT renal stone study showed  1. Mild left hydroureteronephrosis with periureteral/peripelvic stranding, no urinary calculi. 2. Imaging findings are compatible with recently passed left ureteral stone versus acute left pyelonephritis.   04/08/2024 Imaging   CT angiogram abdomen/pelvis with and without contrast  1. Obstructing 5.9 x 4.5 cm enhancing mass along the left pelvic sidewall causing left hydroureter, hydronephrosis, delayed nephrogram, and nephromegaly; mass abuts the left adnexa with loss of the intervening fat plane. This is highly concerning for malignant neoplasm. Etiology indeterminate. It's possible that this could be arising from a primary urothelial neoplasm of the left ureter. Other primary neoplasms or metastatic disease not excluded. Correlation with tissue sampling is advised. 2. Patent abdominal vasculature without signs of mesenteric ischemia.   04/24/2024 Imaging   PET scan showed  1. The posterior left pelvic sidewall mass is hypermetabolic, consistent with active lymphoma. 2. Mild splenic hypermetabolism relative to the liver. No splenomegaly. Cannot exclude lymphomatous involvement. 3. New small right pleural effusion. 4. No active lymphoma within the chest or neck. Mildly decreased sensitivity exam secondary to hypermetabolic brown fat. 5. Age advanced coronary artery atherosclerosis. Recommend assessment of coronary risk factors. 6.  Aortic Atherosclerosis    04/26/2024 Initial Diagnosis   Lymphoma (HCC)  Symptoms began on 03/10/2024 with pain radiating  down the left leg, associated with the sciatic nerve. Initially managed with high doses of ibuprofen, which provided  some relief, but the pain worsened within a week, leading to an emergency room visit where she received a steroid injection in her lower back.  04/08/2024 she returned to the hospital due to increased pain. A CT scan revealed a pelvic mass obstructing the ureter, and a stent was placed by a urologist.  Patient underwent biopsy of pelvis mass Pathology showed 1. Lymph node, needle/core biopsy, 18G cores infilatratic left pelvic sidewall mass :       LARGE B-CELL LYMPHOMA.  soft      tissue with atypical lymphoid infiltrate comprised predominantly of medium to      large sized cells.  Abundant mitotic figures are present.  Focal areas of      necrosis are noted.  Immunohistochemical stains reveal the malignant cells are      positive for CD20, PAX5, BCL6 (subset), MUM1, Bcl-2 and are negative for CD10      and cyclin D1.  CD30 does not show any significant expression. CD15 highlights      neutrophils. The Ki-67 proliferation index is high approximately 70%.  This lymphoma has a activated B-cell immunophenotype.   EBER-ish is pending and will be reported in an addendum.  Also,High-grade B-cell lymphoma FISH panel will be   ordered to exclude a double/triple hit lymphoma and results will be reported in an addendum   She has experienced unintentional weight loss of 18 pounds since May, without any changes in diet or exercise. No night sweats, low-grade fever, or excessive sweating at night. Baseline health includes chronic back pain, acid reflux, prediabetes, and constipation.  She is taking gabapentin  100 mg once daily for nerve pain, described as sometimes dull and sometimes sharp. Oxycodone  5 mg every six hours for pain management, which helps reduce the pain slightly.  She notes reduced appetite, numbness, and tingling in her leg. Also mentions leg swelling, particularly at night when less active.   04/26/2024 Cancer Staging   Staging form: Hodgkin and Non-Hodgkin Lymphoma, AJCC 8th Edition -  Clinical stage from 04/26/2024: Stage II (Diffuse large B-cell lymphoma) - Signed by Babara Call, MD on 04/26/2024 Stage prefix: Initial diagnosis   05/01/2024 Bone Marrow Biopsy   Bone marrow biopsy results showed  BONE MARROW, ASPIRATE, CLOT, CORE:  - Mildly hypocellular bone marrow with otherwise orderly trilineage hematopoiesis  - No morphologic or immunophenotypic evidence of the patient's recently diagnosed large B-cell lymphoma    05/10/2024 -  Chemotherapy   Patient is on Treatment Plan : NON-HODGKINS LYMPHOMA R-CHOP q21d x 4 cycles      Discussed the use of AI scribe software for clinical note transcription with the patient, who gave verbal consent to proceed.   She experiences severe pain, rated above 10, primarily located in the pelvic region and radiating down to the bottom of her foot. Pain management includes oxycodone  5 mg every four hours, which provides only partial relief. She also uses ice packs and positions herself on her stomach to alleviate the pain.  MEDICAL HISTORY:  Past Medical History:  Diagnosis Date   Arthritis    left shoulder, neck, lower back  (01/11/2018)   Chronic lower back pain    Family history of adverse reaction to anesthesia    Mother has nausea   GERD (gastroesophageal reflux disease)    PONV (postoperative nausea and vomiting)    Pre-diabetes    UTI (urinary tract  infection) 03/20/2021    SURGICAL HISTORY: Past Surgical History:  Procedure Laterality Date   APPENDECTOMY     CYSTOSCOPY W/ URETERAL STENT PLACEMENT Left 04/08/2024   Procedure: CYSTOSCOPY, WITH RETROGRADE PYELOGRAM AND URETERAL STENT INSERTION;  Surgeon: Watt Rush, MD;  Location: [facility] ORS;  Service: Urology;  Laterality: Left;   IR IMAGING GUIDED PORT INSERTION  05/01/2024   JOINT REPLACEMENT     TOTAL SHOULDER ARTHROPLASTY Left 01/11/2018   TOTAL SHOULDER ARTHROPLASTY Left 01/11/2018   Procedure: LEFT TOTAL SHOULDER ARTHROPLASTY;  Surgeon: Sharl Selinda Dover, MD;   Location: [facility] OR;  Service: Orthopedics;  Laterality: Left;  2.5 hrs   TOTAL SHOULDER ARTHROPLASTY Right 02/08/2020   Procedure: TOTAL SHOULDER ARTHROPLASTY;  Surgeon: Sharl Selinda Dover, MD;  Location: [facility] OR;  Service: Orthopedics;  Laterality: Right;  2.5 hrs RNFA    SOCIAL HISTORY: Social History   Socioeconomic History   Marital status: Married    Spouse name: Keyundra Fant   Number of children: Not on file   Years of education: Not on file   Highest education level: Not on file  Occupational History   Not on file  Tobacco Use   Smoking status: Never   Smokeless tobacco: Never  Vaping Use   Vaping status: Never Used  Substance and Sexual Activity   Alcohol use: Yes    Comment: 01/11/2018 might have 1 drink/month   Drug use: Never   Sexual activity: Yes  Other Topics Concern   Not on file  Social History Narrative   Not on file   Social Drivers of Health   Financial Resource Strain: Low Risk  (03/30/2024)   Received from [facility]   Overall Financial Resource Strain (CARDIA)    Difficulty of Paying Living Expenses: Not hard at all  Food Insecurity: No Food Insecurity (04/20/2024)   Hunger Vital Sign    Worried About Running Out of Food in the Last Year: Never true    Ran Out of Food in the Last Year: Never true  Transportation Needs: No Transportation Needs (04/20/2024)   PRAPARE - Administrator, Civil Service (Medical): No    Lack of Transportation (Non-Medical): No  Physical Activity: Not on file  Stress: Not on file  Social Connections: Not on file  Intimate Partner Violence: Not At Risk (04/20/2024)   Humiliation, Afraid, Rape, and Kick questionnaire    Fear of Current or Ex-Partner: No    Emotionally Abused: No    Physically Abused: No    Sexually Abused: No    FAMILY HISTORY: Family History  Problem Relation Age of Onset   Hypertension Mother    Heart disease Father    Heart attack Father    CAD Father    Colon  cancer Neg Hx    Esophageal cancer Neg Hx    Rectal cancer Neg Hx    Stomach cancer Neg Hx     ALLERGIES:  is allergic to propofol .  MEDICATIONS:  Current Outpatient Medications  Medication Sig Dispense Refill   acyclovir (ZOVIRAX) 400 MG tablet Take 1 tablet (400 mg total) by mouth daily. 30 tablet 3   allopurinol (ZYLOPRIM) 300 MG tablet Take 1 tablet (300 mg total) by mouth daily. 30 tablet 3   gabapentin  (NEURONTIN ) 100 MG capsule Take 1 capsule (100 mg total) by mouth 3 (three) times daily. 90 capsule 0   lidocaine -prilocaine (EMLA) cream Apply to affected area once 30 g 3   methocarbamol  (ROBAXIN ) 500 MG  tablet Take 500 mg by mouth every 8 (eight) hours as needed for muscle spasms.  0   omeprazole  (PRILOSEC) 20 MG capsule TAKE 1 CAPSULE (20 MG TOTAL) BY MOUTH 2 (TWO) TIMES DAILY BEFORE A MEAL. 180 capsule 0   ondansetron  (ZOFRAN ) 8 MG tablet Take 1 tablet (8 mg total) by mouth every 8 (eight) hours as needed for nausea or vomiting. Start on the third day after cyclophosphamide chemotherapy. 30 tablet 1   oxybutynin (DITROPAN-XL) 10 MG 24 hr tablet Take 1 tablet (10 mg total) by mouth daily. 30 tablet 0   oxyCODONE  (OXY IR/ROXICODONE ) 5 MG immediate release tablet Take 1 tablet (5 mg total) by mouth every 4 (four) hours as needed. 90 tablet 0   oxyCODONE  (OXYCONTIN ) 15 mg 12 hr tablet Take 1 tablet (15 mg total) by mouth every 12 (twelve) hours. 30 tablet 0   predniSONE  (DELTASONE ) 20 MG tablet Take 5 tablets (100 mg total) by mouth daily. Take with food on days 2-5 of chemotherapy. 20 tablet 5   prochlorperazine (COMPAZINE) 10 MG tablet Take 1 tablet (10 mg total) by mouth every 6 (six) hours as needed for nausea or vomiting. 30 tablet 6   sodium phosphate  (FLEET) ENEM Place 133 mLs (1 enema total) rectally daily as needed for severe constipation. 3 mL 0   tamsulosin (FLOMAX) 0.4 MG CAPS capsule Take 1 capsule (0.4 mg total) by mouth daily. 30 capsule 0   No current  facility-administered medications for this visit.   Facility-Administered Medications Ordered in Other Visits  Medication Dose Route Frequency Provider Last Rate Last Admin   0.9 %  sodium chloride  infusion   Intravenous Continuous Babara Call, MD 10 mL/hr at 05/10/24 0947 New Bag at 05/10/24 0947    Review of Systems  Constitutional:  Positive for appetite change, fatigue and unexpected weight change. Negative for chills and fever.  HENT:   Negative for hearing loss and voice change.   Eyes:  Negative for eye problems.  Respiratory:  Negative for chest tightness and cough.   Cardiovascular:  Negative for chest pain.  Gastrointestinal:  Negative for abdominal distention, abdominal pain and blood in stool.  Endocrine: Negative for hot flashes.  Genitourinary:  Negative for difficulty urinating and frequency.   Musculoskeletal:  Positive for back pain. Negative for arthralgias.  Skin:  Negative for itching and rash.  Neurological:  Negative for extremity weakness.  Hematological:  Negative for adenopathy.  Psychiatric/Behavioral:  Negative for confusion.      PHYSICAL EXAMINATION: ECOG PERFORMANCE STATUS: 1 - Symptomatic but completely ambulatory  Vitals:   05/10/24 0903  BP: (!) 113/92  Pulse: (!) 110  Resp: 16  Temp: 98.1 F (36.7 C)  SpO2: 98%   Filed Weights   05/10/24 0903  Weight: 169 lb (76.7 kg)    Physical Exam Constitutional:      General: She is not in acute distress.    Appearance: She is not diaphoretic.  HENT:     Head: Normocephalic and atraumatic.     Mouth/Throat:     Pharynx: No oropharyngeal exudate.  Eyes:     General: No scleral icterus.    Pupils: Pupils are equal, round, and reactive to light.  Cardiovascular:     Rate and Rhythm: Normal rate and regular rhythm.     Heart sounds: No murmur heard. Pulmonary:     Effort: Pulmonary effort is normal. No respiratory distress.     Breath sounds: Normal breath sounds. No wheezing.  Abdominal:      General: There is no distension.     Palpations: Abdomen is soft.     Tenderness: There is no abdominal tenderness.  Musculoskeletal:        General: Normal range of motion.     Cervical back: Normal range of motion and neck supple.  Skin:    General: Skin is warm and dry.     Findings: No erythema.  Neurological:     Mental Status: She is alert and oriented to person, place, and time.     Cranial Nerves: No cranial nerve deficit.     Motor: No abnormal muscle tone.     Coordination: Coordination normal.  Psychiatric:        Mood and Affect: Affect normal.      LABORATORY DATA:  I have reviewed the data as listed    Latest Ref Rng & Units 05/10/2024    8:38 AM 05/01/2024    8:38 AM 04/20/2024   10:32 AM  CBC  WBC 4.0 - 10.5 K/uL 5.8  6.1  7.4   Hemoglobin 12.0 - 15.0 g/dL 87.0  86.7  86.8   Hematocrit 36.0 - 46.0 % 38.1  40.1  38.5   Platelets 150 - 400 K/uL 180  192  177       Latest Ref Rng & Units 05/10/2024    8:38 AM 04/20/2024   10:32 AM 04/09/2024    4:36 AM  CMP  Glucose 70 - 99 mg/dL 874  879  839   BUN 8 - 23 mg/dL 15  9  17    Creatinine 0.44 - 1.00 mg/dL 9.32  9.31  9.29   Sodium 135 - 145 mmol/L 134  133  137   Potassium 3.5 - 5.1 mmol/L 3.7  4.0  4.7   Chloride 98 - 111 mmol/L 98  100  105   CO2 22 - 32 mmol/L 24  24  25    Calcium 8.9 - 10.3 mg/dL 8.9  8.9  8.3   Total Protein 6.5 - 8.1 g/dL 7.4  7.6    Total Bilirubin 0.0 - 1.2 mg/dL 0.7  0.8    Alkaline Phos 38 - 126 U/L 62  63    AST 15 - 41 U/L 18  16    ALT 0 - 44 U/L 10  9       RADIOGRAPHIC STUDIES: I have personally reviewed the radiological images as listed and agreed with the findings in the report. IR IMAGING GUIDED PORT INSERTION Result Date: 05/01/2024 CLINICAL DATA:  Large B-cell lymphoma of the pelvis and need for porta cath to begin chemotherapy. EXAM: IMPLANTED PORT A CATH PLACEMENT WITH ULTRASOUND AND FLUOROSCOPIC GUIDANCE ANESTHESIA/SEDATION: Moderate (conscious) sedation was  employed during this procedure. A total of Versed  1.0 mg and Fentanyl  50 mcg was administered intravenously. Moderate Sedation Time: 28 minutes. The patient's level of consciousness and vital signs were monitored continuously by radiology nursing throughout the procedure under my direct supervision. FLUOROSCOPY: Radiation Exposure Index: 1.9 mGy Kerma PROCEDURE: The procedure, risks, benefits, and alternatives were explained to the patient. Questions regarding the procedure were encouraged and answered. The patient understands and consents to the procedure. A time-out was performed prior to initiating the procedure. Ultrasound was utilized to confirm patency of the right internal jugular vein. An ultrasound image was saved and recorded. The right neck and chest were prepped with chlorhexidine  in a sterile fashion, and a sterile drape was applied covering the operative field. Maximum  barrier sterile technique with sterile gowns and gloves were used for the procedure. Local anesthesia was provided with 1% lidocaine . After creating a small venotomy incision, a 21 gauge needle was advanced into the right internal jugular vein under direct, real-time ultrasound guidance. Ultrasound image documentation was performed. After securing guidewire access, an 8 Fr dilator was placed. A J-wire was kinked to measure appropriate catheter length. A subcutaneous port pocket was then created along the upper chest wall utilizing sharp and blunt dissection. Portable cautery was utilized. The pocket was irrigated with sterile saline. A single lumen power injectable port was chosen for placement. The 8 Fr catheter was tunneled from the port pocket site to the venotomy incision. The port was placed in the pocket. External catheter was trimmed to appropriate length based on guidewire measurement. At the venotomy, an 8 Fr peel-away sheath was placed over a guidewire. The catheter was then placed through the sheath and the sheath removed.  Final catheter positioning was confirmed and documented with a fluoroscopic spot image. The port was accessed with a needle and aspirated and flushed with heparinized saline. The access needle was removed. The venotomy and port pocket incisions were closed with subcutaneous 3-0 Monocryl and subcuticular 4-0 Vicryl. Dermabond was applied to both incisions. COMPLICATIONS: COMPLICATIONS None FINDINGS: After catheter placement, the tip lies at the cavo-atrial junction. The catheter aspirates normally and is ready for immediate use. IMPRESSION: Placement of single lumen port a cath via right internal jugular vein. The catheter tip lies at the cavo-atrial junction. A power injectable port a cath was placed and is ready for immediate use. Electronically Signed   By: Marcey Moan M.D.   On: 05/01/2024 11:53   CT BONE MARROW BIOPSY & ASPIRATION Result Date: 05/01/2024 CLINICAL DATA:  Large B-cell lymphoma of pelvic sidewall and need for bone marrow biopsy. EXAM: CT GUIDED BONE MARROW ASPIRATION AND BIOPSY ANESTHESIA/SEDATION: Moderate (conscious) sedation was employed during this procedure. A total of Versed  2.0 mg and Fentanyl  100 mcg was administered intravenously. Moderate Sedation Time: 10 minutes. The patient's level of consciousness and vital signs were monitored continuously by radiology nursing throughout the procedure under my direct supervision. PROCEDURE: The procedure risks, benefits, and alternatives were explained to the patient. Questions regarding the procedure were encouraged and answered. The patient understands and consents to the procedure. A time out was performed prior to initiating the procedure. The right gluteal region was prepped with chlorhexidine . Sterile gown and sterile gloves were used for the procedure. Local anesthesia was provided with 1% Lidocaine . Under CT guidance, an 11 gauge On Control bone cutting needle was advanced from a posterior approach into the right iliac bone. Needle  positioning was confirmed with CT. Initial non heparinized and heparinized aspirate samples were obtained of bone marrow. Core biopsy was performed via the On Control drill needle. COMPLICATIONS: None FINDINGS: Inspection of initial aspirate did reveal visible particles. Intact core biopsy sample was obtained. IMPRESSION: CT guided bone marrow biopsy of right posterior iliac bone with both aspirate and core samples obtained. Electronically Signed   By: Marcey Moan M.D.   On: 05/01/2024 10:41   ECHOCARDIOGRAM COMPLETE Result Date: 04/26/2024    ECHOCARDIOGRAM REPORT   Patient Name:   WHITNIE DELEON Date of Exam: 04/26/2024 Medical Rec #:  [id number]          Height:       64.0 in Accession #:    [id number]         Weight:  170.3 lb Date of Birth:  November 02, 1960          BSA:          1.827 m Patient Age:    63 years           BP:           121/67 mmHg Patient Gender: F                  HR:           54 bpm. Exam Location:  [facility] Procedure: 2D Echo, 3D Echo, Cardiac Doppler, Color Doppler and Strain Analysis            (Both Spectral and Color Flow Doppler were utilized during            procedure). Indications:     Chemo Z09  History:         Patient has no prior history of Echocardiogram examinations.  Sonographer:     Thea Norlander RCS Referring Phys:  [id number] Kimley Apsey Diagnosing Phys: Evalene Lunger MD IMPRESSIONS  1. Left ventricular ejection fraction, by estimation, is 55 to 60%. The left ventricle has normal function. The left ventricle has no regional wall motion abnormalities. Left ventricular diastolic parameters are consistent with Grade I diastolic dysfunction (impaired relaxation). The average left ventricular global longitudinal strain is -19.7 %. The global longitudinal strain is normal.  2. Right ventricular systolic function is normal. The right ventricular size is normal.  3. The mitral valve is normal in structure. Mild mitral valve regurgitation. No evidence of mitral stenosis.  4.  The aortic valve is normal in structure. Aortic valve regurgitation is not visualized. No aortic stenosis is present.  5. The inferior vena cava is normal in size with greater than 50% respiratory variability, suggesting right atrial pressure of 3 mmHg. FINDINGS  Left Ventricle: Left ventricular ejection fraction, by estimation, is 55 to 60%. The left ventricle has normal function. The left ventricle has no regional wall motion abnormalities. The average left ventricular global longitudinal strain is -19.7 %. Strain was performed and the global longitudinal strain is normal. The left ventricular internal cavity size was normal in size. There is no left ventricular hypertrophy. Left ventricular diastolic parameters are consistent with Grade I diastolic dysfunction (impaired relaxation). Right Ventricle: The right ventricular size is normal. No increase in right ventricular wall thickness. Right ventricular systolic function is normal. Left Atrium: Left atrial size was normal in size. Right Atrium: Right atrial size was normal in size. Pericardium: There is no evidence of pericardial effusion. Mitral Valve: The mitral valve is normal in structure. Mild mitral valve regurgitation. No evidence of mitral valve stenosis. Tricuspid Valve: The tricuspid valve is normal in structure. Tricuspid valve regurgitation is trivial. No evidence of tricuspid stenosis. Aortic Valve: The aortic valve is normal in structure. Aortic valve regurgitation is not visualized. No aortic stenosis is present. Aortic valve peak gradient measures 5.2 mmHg. Pulmonic Valve: The pulmonic valve was normal in structure. Pulmonic valve regurgitation is not visualized. No evidence of pulmonic stenosis. Aorta: The aortic root is normal in size and structure. Venous: The inferior vena cava is normal in size with greater than 50% respiratory variability, suggesting right atrial pressure of 3 mmHg. IAS/Shunts: No atrial level shunt detected by color flow  Doppler. Additional Comments: 3D was performed not requiring image post processing on an independent workstation and was indeterminate.  LEFT VENTRICLE PLAX 2D LVIDd:  4.10 cm   Diastology LVIDs:         3.10 cm   LV e' medial:  6.96 cm/s LV PW:         0.80 cm   LV e' lateral: 9.14 cm/s LV IVS:        0.70 cm LVOT diam:     2.40 cm   2D Longitudinal Strain LV SV:         79        2D Strain GLS Avg:     -19.7 % LV SV Index:   43 LVOT Area:     4.52 cm                           3D Volume EF:                          3D EF:        58 %                          LV EDV:       129 ml                          LV ESV:       55 ml                          LV SV:        74 ml RIGHT VENTRICLE             IVC RV S prime:     13.20 cm/s  IVC diam: 1.20 cm TAPSE (M-mode): 2.3 cm LEFT ATRIUM             Index        RIGHT [facility]           Index LA diam:        3.30 cm 1.81 cm/m   RA Area:     13.40 cm LA Vol (A2C):   35.3 ml 19.32 ml/m  RA Volume:   27.80 ml  15.21 ml/m LA Vol (A4C):   20.4 ml 11.16 ml/m LA Biplane Vol: 28.9 ml 15.82 ml/m  AORTIC VALVE AV Area (Vmax): 3.59 cm AV Vmax:        114.00 cm/s AV Peak Grad:   5.2 mmHg LVOT Vmax:      90.40 cm/s LVOT Vmean:     56.100 cm/s LVOT VTI:       0.175 m  AORTA Ao Root diam: 3.10 cm Ao Asc diam:  3.30 cm TRICUSPID VALVE TR Peak grad:   7.7 mmHg TR Vmax:        139.00 cm/s  SHUNTS Systemic VTI:  0.18 m Systemic Diam: 2.40 cm Evalene Lunger MD Electronically signed by Evalene Lunger MD Signature Date/Time: 04/26/2024/2:00:19 PM    Final    NM PET Image Initial (PI) Skull Base To Thigh (F-18 FDG) Result Date: 04/25/2024 CLINICAL DATA:  Initial treatment strategy for new diagnosis of lymphoma. Known left pelvic soft tissue mass. EXAM: NUCLEAR MEDICINE PET SKULL BASE TO THIGH TECHNIQUE: 9.4 mCi F-18 FDG was injected intravenously. Full-ring PET imaging was performed from the skull base to thigh after the radiotracer. CT data was obtained and used for attenuation  correction and anatomic localization. Fasting blood glucose:  86 mg/dl COMPARISON:  Abdominopelvic CTA and CT of 04/08/2024 FINDINGS: Mediastinal blood pool activity: SUV max 3.5 Liver activity: SUV max 4.0 NECK: Extensive hypermetabolic brown fat throughout the neck and upper chest. Given this limitation, no cervical nodal hypermetabolism identified. Incidental CT findings: Left carotid atherosclerosis. No cervical adenopathy. CHEST: No pulmonary parenchymal or thoracic nodal hypermetabolism. Incidental CT findings: Aortic and coronary artery calcification. Tiny right pleural effusion is new since the prior CTs. ABDOMEN/PELVIS: Mild splenic hypermetabolism relative to the liver (SUV 4.6). No splenomegaly. The posterior left pelvic sidewall mass on prior CTA is hypermetabolic. Difficult to measure on noncontrast CT, estimated at 4.5 x 3.4 cm and a S.U.V. max of 14.1 on 127/6. No separate areas of abdominopelvic nodal hypermetabolism. Incidental CT findings: Left ureteric stent in place, without significant hydronephrosis. SKELETON: No abnormal marrow activity. Incidental CT findings: Mild degenerative changes of both hips. Bilateral shoulder arthroplasties. IMPRESSION: 1. The posterior left pelvic sidewall mass is hypermetabolic, consistent with active lymphoma. 2. Mild splenic hypermetabolism relative to the liver. No splenomegaly. Cannot exclude lymphomatous involvement. 3. New small right pleural effusion. 4. No active lymphoma within the chest or neck. Mildly decreased sensitivity exam secondary to hypermetabolic brown fat. 5. Age advanced coronary artery atherosclerosis. Recommend assessment of coronary risk factors. 6.  Aortic Atherosclerosis (ICD10-I70.0). Electronically Signed   By: Rockey Kilts M.D.   On: 04/25/2024 08:59   CT ABDOMINAL MASS BIOPSY Result Date: 04/10/2024 INDICATION: 63 year old female with a newly diagnosed infiltrative mass along the left pelvic sidewall. EXAM: CT-guided core biopsy  of deep soft tissue mass MEDICATIONS: None. ANESTHESIA/SEDATION: Moderate (conscious) sedation was employed during this procedure. A total of Versed  1 mg and Fentanyl  50 mcg was administered intravenously by the radiology nurse. Total intra-service moderate Sedation Time: 10 minutes. The patient's level of consciousness and vital signs were monitored continuously by radiology nursing throughout the procedure under my direct supervision. COMPLICATIONS: None immediate. PROCEDURE: Informed written consent was obtained from the patient after a thorough discussion of the procedural risks, benefits and alternatives. All questions were addressed. Maximal Sterile Barrier Technique was utilized including caps, mask, sterile gowns, sterile gloves, sterile drape, hand hygiene and skin antiseptic. A timeout was performed prior to the initiation of the procedure. A CT scan was performed identifying the ill-defined mass along the left pelvic sidewall. A suitable skin entry site was selected and marked. The skin was sterilely prepped and draped in standard fashion using chlorhexidine  skin prep. Local anesthesia was attained by infiltration with 1% lidocaine . A small dermatotomy was made. A 17 gauge introducer needle was carefully advanced through the gluteal musculature and into the margin of the mass. Needle positioning was cross reference with the prior CTA of the abdomen and pelvis to ensure trajectory of the biopsy passes with not cross the internal iliac vessels. Several 18 gauge core biopsies were then obtained using the bio Pince automated biopsy device. Biopsies were placed in saline and delivered to pathology for further analysis. Post biopsy CT imaging demonstrates no evidence of immediate complication. IMPRESSION: CT-guided core biopsy of infiltrative left pelvic sidewall mass. Electronically Signed   By: Wilkie Lent M.D.   On: 04/10/2024 13:00

## 2024-05-10 NOTE — Assessment & Plan Note (Addendum)
 Continue Oxycodone  5mg  Q4h PRN.  Add Oxycontin  15mg  Q12 hour.  Continue Gabapnetin 100mg  TID

## 2024-05-10 NOTE — Patient Instructions (Signed)

## 2024-05-11 ENCOUNTER — Encounter: Payer: Self-pay | Admitting: Oncology

## 2024-05-11 ENCOUNTER — Telehealth: Payer: Self-pay

## 2024-05-11 NOTE — Telephone Encounter (Signed)
Telephone call to patient for follow up after receiving first infusion.   Patient states infusion went great.  States eating good and drinking plenty of fluids.   Denies any nausea or vomiting.  Encouraged patient to call for any concerns or questions. 

## 2024-05-11 NOTE — Addendum Note (Signed)
 Addended by: BABARA CALL on: 05/11/2024 03:35 PM   Modules accepted: Orders

## 2024-05-11 NOTE — Telephone Encounter (Signed)
 PA submitted for oxyCODONE  (OXYCONTIN ) 15 mg 12 hr tablet via [employer] (key [id number])

## 2024-05-12 ENCOUNTER — Inpatient Hospital Stay

## 2024-05-12 ENCOUNTER — Other Ambulatory Visit: Payer: Self-pay

## 2024-05-12 ENCOUNTER — Inpatient Hospital Stay (HOSPITAL_BASED_OUTPATIENT_CLINIC_OR_DEPARTMENT_OTHER): Admitting: Nurse Practitioner

## 2024-05-12 ENCOUNTER — Encounter: Payer: Self-pay | Admitting: Oncology

## 2024-05-12 VITALS — BP 136/101 | HR 104 | Temp 98.7°F

## 2024-05-12 DIAGNOSIS — E860 Dehydration: Secondary | ICD-10-CM

## 2024-05-12 DIAGNOSIS — C8590 Non-Hodgkin lymphoma, unspecified, unspecified site: Secondary | ICD-10-CM

## 2024-05-12 DIAGNOSIS — R000 Tachycardia, unspecified: Secondary | ICD-10-CM | POA: Diagnosis not present

## 2024-05-12 DIAGNOSIS — Z5112 Encounter for antineoplastic immunotherapy: Secondary | ICD-10-CM | POA: Diagnosis not present

## 2024-05-12 DIAGNOSIS — R110 Nausea: Secondary | ICD-10-CM | POA: Diagnosis not present

## 2024-05-12 DIAGNOSIS — C8330 Diffuse large B-cell lymphoma, unspecified site: Secondary | ICD-10-CM

## 2024-05-12 DIAGNOSIS — R112 Nausea with vomiting, unspecified: Secondary | ICD-10-CM

## 2024-05-12 LAB — COMPREHENSIVE METABOLIC PANEL WITH GFR
ALT: 12 U/L (ref 0–44)
AST: 20 U/L (ref 15–41)
Albumin: 3.5 g/dL (ref 3.5–5.0)
Alkaline Phosphatase: 55 U/L (ref 38–126)
Anion gap: 9 (ref 5–15)
BUN: 23 mg/dL (ref 8–23)
CO2: 24 mmol/L (ref 22–32)
Calcium: 8.6 mg/dL — ABNORMAL LOW (ref 8.9–10.3)
Chloride: 101 mmol/L (ref 98–111)
Creatinine, Ser: 0.54 mg/dL (ref 0.44–1.00)
GFR, Estimated: >60 mL/min (ref >60)
Glucose, Bld: 154 mg/dL — ABNORMAL HIGH (ref 70–99)
Potassium: 3.7 mmol/L (ref 3.5–5.1)
Sodium: 134 mmol/L — ABNORMAL LOW (ref 135–145)
Total Bilirubin: 0.7 mg/dL (ref 0.0–1.2)
Total Protein: 7 g/dL (ref 6.5–8.1)

## 2024-05-12 LAB — CBC WITH DIFFERENTIAL/PLATELET
Abs Immature Granulocytes: 0.03 K/uL (ref 0.00–0.07)
Basophils Absolute: 0 K/uL (ref 0.0–0.1)
Basophils Relative: 0 %
Eosinophils Absolute: 0 K/uL (ref 0.0–0.5)
Eosinophils Relative: 0 %
HCT: 35.8 % — ABNORMAL LOW (ref 36.0–46.0)
Hemoglobin: 12.4 g/dL (ref 12.0–15.0)
Immature Granulocytes: 1 %
Lymphocytes Relative: 4 %
Lymphs Abs: 0.2 K/uL — ABNORMAL LOW (ref 0.7–4.0)
MCH: 31 pg (ref 26.0–34.0)
MCHC: 34.6 g/dL (ref 30.0–36.0)
MCV: 89.5 fL (ref 80.0–100.0)
Monocytes Absolute: 0.1 K/uL (ref 0.1–1.0)
Monocytes Relative: 1 %
Neutro Abs: 4.8 K/uL (ref 1.7–7.7)
Neutrophils Relative %: 94 %
Platelets: 189 K/uL (ref 150–400)
RBC: 4 MIL/uL (ref 3.87–5.11)
RDW: 12.9 % (ref 11.5–15.5)
WBC: 5.1 K/uL (ref 4.0–10.5)
nRBC: 0 % (ref 0.0–0.2)

## 2024-05-12 LAB — URIC ACID: Uric Acid, Serum: 3.7 mg/dL (ref 2.5–7.1)

## 2024-05-12 MED ORDER — PROCHLORPERAZINE EDISYLATE 10 MG/2ML IJ SOLN
10.0000 mg | Freq: Once | INTRAMUSCULAR | Status: AC
  Administered 2024-05-12: 10 mg via INTRAVENOUS
  Filled 2024-05-12: qty 2

## 2024-05-12 MED ORDER — FENTANYL 25 MCG/HR TD PT72: 1.0000 | MEDICATED_PATCH | TRANSDERMAL | 0 refills | Status: DC

## 2024-05-12 MED ORDER — PEGFILGRASTIM-JMDB 6 MG/0.6ML ~~LOC~~ SOSY
6.0000 mg | PREFILLED_SYRINGE | Freq: Once | SUBCUTANEOUS | Status: AC
  Administered 2024-05-12: 6 mg via SUBCUTANEOUS
  Filled 2024-05-12: qty 0.6

## 2024-05-12 MED ORDER — SODIUM CHLORIDE 0.9 % IV SOLN
Freq: Once | INTRAVENOUS | Status: AC
  Filled 2024-05-12: qty 250

## 2024-05-12 NOTE — Addendum Note (Signed)
 Addended by: BABARA CALL on: 05/12/2024 12:58 PM   Modules accepted: Orders

## 2024-05-12 NOTE — Progress Notes (Signed)
 Hematology/Oncology Consult Note [phone] [phone]   CHIEF COMPLAINTS/PURPOSE OF CONSULTATION:  Patient here at clinic today for GCSF injection however presented very nauseated without vomiting, tachycardic, having muscle cramps in both legs, increased weakness, dizziness added on for [facility]  ASSESSMENT & PLAN:   Muscle cramps in left leg: ordered cbc with diff/cmp/uric acid to r/o tumor lysis in which labs were stable no evidence of tumor lysis, instructed patient to continue with prophylaxis allopurinol at home.  Muscle cramps did resolve with 1 liter of IVF bolus today in clinic.  Nausea/decreased appetite chemo related no vomiting:  instructed her to optimize Zofran  and compazine at home.  Completed 1 liter IVF bolus and IV compazine in clinic today symptoms did resolve.    Dehydration/tachycardia:  Completed labs in clinic and reviewed, they are stable tachycardia resolved with 1 liter IVF bolus here in clinic.  Encouraged patient to optimize antiemetics at home and push PO fluids as well to prevent dehydration.  F/U in clinic as planned next week for repeat labs and possible fluids.    Cancer Staging  Diffuse large B-cell lymphoma (HCC) Staging form: Hodgkin and Non-Hodgkin Lymphoma, AJCC 8th Edition - Clinical stage from 04/26/2024: Stage II (Diffuse large B-cell lymphoma) - Signed by Babara Call, MD on 04/26/2024   HISTORY OF PRESENTING ILLNESS:  Patient with a history of diffuse large B-cell lymphoma with large pelvic mass on the left side causing sciatica symptoms.  Her first treatment of R CHOP completed on Wednesday in clinic today for D3 GCSF injection.  Returned to clinic today with complaints of decreased appetite, dizziness, nausea without vomiting, muscle cramps in left leg, and increased weakness.  No fever/chills, no falls or syncopal episodes.  She is tachycardic with regular rhythm appears dehydrated.    Patient reports taking all prophylactic  medications this week including her allopurinol.  We did rule out tumor lysis syndrome today while she was here in clinic.  We will proceed with GCSF today as planned as well as IVF bolus and IV compazine for nausea.  I have instructed her to take Claritin and tylenol  to help decrease symptoms of GCSF, she verbalizes understanding.  She reports that the pain in her left hip/leg remains about the same, however she just feels extremely weak today.  She has an appointment here with MD next week for labs and possible fluids.  Instructed her to call before if needed.      Disposition: 1 liter NS ivf bolus today, IV compazine today CBC with diff/cmp/ uric acid today F/U next week as planned LP      Morna Husband [facility] Hematology Oncology 05/12/2024    Summary of oncologic history is as follows: Oncology History  Diffuse large B-cell lymphoma (HCC)  04/08/2024 Imaging   CT renal stone study showed  1. Mild left hydroureteronephrosis with periureteral/peripelvic stranding, no urinary calculi. 2. Imaging findings are compatible with recently passed left ureteral stone versus acute left pyelonephritis.   04/08/2024 Imaging   CT angiogram abdomen/pelvis with and without contrast  1. Obstructing 5.9 x 4.5 cm enhancing mass along the left pelvic sidewall causing left hydroureter, hydronephrosis, delayed nephrogram, and nephromegaly; mass abuts the left adnexa with loss of the intervening fat plane. This is highly concerning for malignant neoplasm. Etiology indeterminate. It's possible that this could be arising from a primary urothelial neoplasm of the left ureter. Other primary neoplasms or metastatic disease not excluded. Correlation with tissue sampling is advised. 2. Patent abdominal vasculature  without signs of mesenteric ischemia.   04/24/2024 Imaging   PET scan showed  1. The posterior left pelvic sidewall mass is hypermetabolic, consistent with active lymphoma. 2. Mild  splenic hypermetabolism relative to the liver. No splenomegaly. Cannot exclude lymphomatous involvement. 3. New small right pleural effusion. 4. No active lymphoma within the chest or neck. Mildly decreased sensitivity exam secondary to hypermetabolic brown fat. 5. Age advanced coronary artery atherosclerosis. Recommend assessment of coronary risk factors. 6.  Aortic Atherosclerosis    04/26/2024 Initial Diagnosis   Lymphoma (HCC)  Symptoms began on 03/10/2024 with pain radiating down the left leg, associated with the sciatic nerve. Initially managed with high doses of ibuprofen, which provided some relief, but the pain worsened within a week, leading to an emergency room visit where she received a steroid injection in her lower back.  04/08/2024 she returned to the hospital due to increased pain. A CT scan revealed a pelvic mass obstructing the ureter, and a stent was placed by a urologist.  Patient underwent biopsy of pelvis mass Pathology showed 1. Lymph node, needle/core biopsy, 18G cores infilatratic left pelvic sidewall mass :       LARGE B-CELL LYMPHOMA.  soft      tissue with atypical lymphoid infiltrate comprised predominantly of medium to      large sized cells.  Abundant mitotic figures are present.  Focal areas of      necrosis are noted.  Immunohistochemical stains reveal the malignant cells are      positive for CD20, PAX5, BCL6 (subset), MUM1, Bcl-2 and are negative for CD10      and cyclin D1.  CD30 does not show any significant expression. CD15 highlights      neutrophils. The Ki-67 proliferation index is high approximately 70%.  This lymphoma has a activated B-cell immunophenotype.   EBER-ish is pending and will be reported in an addendum.  Also,High-grade B-cell lymphoma FISH panel will be   ordered to exclude a double/triple hit lymphoma and results will be reported in an addendum   She has experienced unintentional weight loss of 18 pounds since May, without any  changes in diet or exercise. No night sweats, low-grade fever, or excessive sweating at night. Baseline health includes chronic back pain, acid reflux, prediabetes, and constipation.  She is taking gabapentin  100 mg once daily for nerve pain, described as sometimes dull and sometimes sharp. Oxycodone  5 mg every six hours for pain management, which helps reduce the pain slightly.  She notes reduced appetite, numbness, and tingling in her leg. Also mentions leg swelling, particularly at night when less active.   04/26/2024 Cancer Staging   Staging form: Hodgkin and Non-Hodgkin Lymphoma, AJCC 8th Edition - Clinical stage from 04/26/2024: Stage II (Diffuse large B-cell lymphoma) - Signed by Babara Call, MD on 04/26/2024 Stage prefix: Initial diagnosis   05/01/2024 Bone Marrow Biopsy   Bone marrow biopsy results showed  BONE MARROW, ASPIRATE, CLOT, CORE:  - Mildly hypocellular bone marrow with otherwise orderly trilineage hematopoiesis  - No morphologic or immunophenotypic evidence of the patient's recently diagnosed large B-cell lymphoma    05/10/2024 -  Chemotherapy   Patient is on Treatment Plan : NON-HODGKINS LYMPHOMA R-CHOP q21d x 4 cycles      She experiences severe pain, rated above 10, primarily located in the pelvic region and radiating down to the bottom of her foot. Pain management includes oxycodone  5 mg every four hours, which provides only partial relief. She also uses ice packs and  positions herself on her stomach to alleviate the pain.  MEDICAL HISTORY:  Past Medical History:  Diagnosis Date   Arthritis    left shoulder, neck, lower back  (01/11/2018)   Chronic lower back pain    Family history of adverse reaction to anesthesia    Mother has nausea   GERD (gastroesophageal reflux disease)    PONV (postoperative nausea and vomiting)    Pre-diabetes    UTI (urinary tract infection) 03/20/2021    SURGICAL HISTORY: Past Surgical History:  Procedure Laterality Date    APPENDECTOMY     CYSTOSCOPY W/ URETERAL STENT PLACEMENT Left 04/08/2024   Procedure: CYSTOSCOPY, WITH RETROGRADE PYELOGRAM AND URETERAL STENT INSERTION;  Surgeon: Watt Rush, MD;  Location: [facility] ORS;  Service: Urology;  Laterality: Left;   IR IMAGING GUIDED PORT INSERTION  05/01/2024   JOINT REPLACEMENT     TOTAL SHOULDER ARTHROPLASTY Left 01/11/2018   TOTAL SHOULDER ARTHROPLASTY Left 01/11/2018   Procedure: LEFT TOTAL SHOULDER ARTHROPLASTY;  Surgeon: Sharl Selinda Dover, MD;  Location: [facility] OR;  Service: Orthopedics;  Laterality: Left;  2.5 hrs   TOTAL SHOULDER ARTHROPLASTY Right 02/08/2020   Procedure: TOTAL SHOULDER ARTHROPLASTY;  Surgeon: Sharl Selinda Dover, MD;  Location: [facility] OR;  Service: Orthopedics;  Laterality: Right;  2.5 hrs RNFA    SOCIAL HISTORY: Social History   Socioeconomic History   Marital status: Married    Spouse name: Ruba Outen   Number of children: Not on file   Years of education: Not on file   Highest education level: Not on file  Occupational History   Not on file  Tobacco Use   Smoking status: Never   Smokeless tobacco: Never  Vaping Use   Vaping status: Never Used  Substance and Sexual Activity   Alcohol use: Yes    Comment: 01/11/2018 might have 1 drink/month   Drug use: Never   Sexual activity: Yes  Other Topics Concern   Not on file  Social History Narrative   Not on file   Social Drivers of Health   Financial Resource Strain: Low Risk  (03/30/2024)   Received from [facility]   Overall Financial Resource Strain (CARDIA)    Difficulty of Paying Living Expenses: Not hard at all  Food Insecurity: No Food Insecurity (04/20/2024)   Hunger Vital Sign    Worried About Running Out of Food in the Last Year: Never true    Ran Out of Food in the Last Year: Never true  Transportation Needs: No Transportation Needs (04/20/2024)   PRAPARE - Administrator, Civil Service (Medical): No    Lack of Transportation  (Non-Medical): No  Physical Activity: Not on file  Stress: Not on file  Social Connections: Not on file  Intimate Partner Violence: Not At Risk (04/20/2024)   Humiliation, Afraid, Rape, and Kick questionnaire    Fear of Current or Ex-Partner: No    Emotionally Abused: No    Physically Abused: No    Sexually Abused: No    FAMILY HISTORY: Family History  Problem Relation Age of Onset   Hypertension Mother    Heart disease Father    Heart attack Father    CAD Father    Colon cancer Neg Hx    Esophageal cancer Neg Hx    Rectal cancer Neg Hx    Stomach cancer Neg Hx     ALLERGIES:  is allergic to propofol .  MEDICATIONS:  Current Outpatient Medications  Medication Sig Dispense Refill  acyclovir (ZOVIRAX) 400 MG tablet Take 1 tablet (400 mg total) by mouth daily. 30 tablet 3   allopurinol (ZYLOPRIM) 300 MG tablet Take 1 tablet (300 mg total) by mouth daily. 30 tablet 3   fentaNYL  (DURAGESIC ) 25 MCG/HR Place 1 patch onto the skin every 3 (three) days. 5 patch 0   gabapentin  (NEURONTIN ) 100 MG capsule Take 1 capsule (100 mg total) by mouth 3 (three) times daily. 90 capsule 0   lidocaine -prilocaine (EMLA) cream Apply to affected area once 30 g 3   methocarbamol  (ROBAXIN ) 500 MG tablet Take 500 mg by mouth every 8 (eight) hours as needed for muscle spasms.  0   omeprazole  (PRILOSEC) 20 MG capsule TAKE 1 CAPSULE (20 MG TOTAL) BY MOUTH 2 (TWO) TIMES DAILY BEFORE A MEAL. 180 capsule 0   ondansetron  (ZOFRAN ) 8 MG tablet Take 1 tablet (8 mg total) by mouth every 8 (eight) hours as needed for nausea or vomiting. Start on the third day after cyclophosphamide chemotherapy. 30 tablet 1   oxybutynin (DITROPAN-XL) 10 MG 24 hr tablet Take 1 tablet (10 mg total) by mouth daily. 30 tablet 0   oxyCODONE  (OXY IR/ROXICODONE ) 5 MG immediate release tablet Take 1 tablet (5 mg total) by mouth every 4 (four) hours as needed. 90 tablet 0   predniSONE  (DELTASONE ) 20 MG tablet Take 5 tablets (100 mg total) by  mouth daily. Take with food on days 2-5 of chemotherapy. 20 tablet 5   prochlorperazine (COMPAZINE) 10 MG tablet Take 1 tablet (10 mg total) by mouth every 6 (six) hours as needed for nausea or vomiting. 30 tablet 6   sodium phosphate  (FLEET) ENEM Place 133 mLs (1 enema total) rectally daily as needed for severe constipation. 3 mL 0   tamsulosin (FLOMAX) 0.4 MG CAPS capsule Take 1 capsule (0.4 mg total) by mouth daily. 30 capsule 0   No current facility-administered medications for this visit.    Review of Systems  Constitutional:  Positive for appetite change, chills, fatigue and unexpected weight change. Negative for fever.  HENT:   Negative for hearing loss and voice change.   Eyes:  Negative for eye problems.  Respiratory:  Negative for chest tightness and cough.   Cardiovascular:  Negative for chest pain.  Gastrointestinal:  Positive for nausea. Negative for abdominal distention, abdominal pain, blood in stool, diarrhea and vomiting.  Endocrine: Negative for hot flashes.  Genitourinary:  Negative for difficulty urinating and frequency.   Musculoskeletal:  Positive for back pain. Negative for arthralgias.  Skin:  Negative for itching and rash.  Neurological:  Positive for dizziness and extremity weakness.  Hematological:  Negative for adenopathy.  Psychiatric/Behavioral:  Negative for confusion.      PHYSICAL EXAMINATION: ECOG PERFORMANCE STATUS: 1 - Symptomatic but completely ambulatory   05/10/24 05/10/24 12/08/23   BP 113/92 (Abnormal)   121/67 122/82  Pulse Rate 110 (Abnormal)   82 68  Resp 16 20 --  Temp 98.1 F (36.7 C) 99.8 F (37.7 C) --  Temp Source Tympanic Tympanic --  SpO2 98 % 97 % 99 %  Weight 169 lb (76.7 kg)       Physical Exam Constitutional:      General: She is in acute distress.     Appearance: She is not toxic-appearing or diaphoretic.  HENT:     Head: Normocephalic and atraumatic.     Nose: No congestion or rhinorrhea.     Mouth/Throat:      Pharynx: No oropharyngeal exudate.  Eyes:     General: No scleral icterus.    Pupils: Pupils are equal, round, and reactive to light.  Cardiovascular:     Rate and Rhythm: Regular rhythm. Tachycardia present.     Heart sounds: No murmur heard. Pulmonary:     Effort: Pulmonary effort is normal.     Breath sounds: Normal breath sounds.  Abdominal:     General: There is no distension.     Palpations: Abdomen is soft.     Tenderness: There is no abdominal tenderness.  Musculoskeletal:        General: Normal range of motion.     Cervical back: Normal range of motion and neck supple.  Skin:    General: Skin is warm and dry.  Neurological:     Mental Status: She is alert and oriented to person, place, and time.     Cranial Nerves: No cranial nerve deficit.     Motor: No abnormal muscle tone.     Coordination: Coordination normal.  Psychiatric:        Mood and Affect: Affect normal.      LABORATORY DATA:  I have reviewed the data as listed    Latest Ref Rng & Units 05/12/2024    3:08 PM 05/10/2024    8:38 AM 05/01/2024    8:38 AM  CBC  WBC 4.0 - 10.5 K/uL 5.1  5.8  6.1   Hemoglobin 12.0 - 15.0 g/dL 87.5  87.0  86.7   Hematocrit 36.0 - 46.0 % 35.8  38.1  40.1   Platelets 150 - 400 K/uL 189  180  192       Latest Ref Rng & Units 05/12/2024    3:08 PM 05/10/2024    8:38 AM 04/20/2024   10:32 AM  CMP  Glucose 70 - 99 mg/dL 845  874  879   BUN 8 - 23 mg/dL 23  15  9    Creatinine 0.44 - 1.00 mg/dL 9.45  9.32  9.31   Sodium 135 - 145 mmol/L 134  134  133   Potassium 3.5 - 5.1 mmol/L 3.7  3.7  4.0   Chloride 98 - 111 mmol/L 101  98  100   CO2 22 - 32 mmol/L 24  24  24    Calcium 8.9 - 10.3 mg/dL 8.6  8.9  8.9   Total Protein 6.5 - 8.1 g/dL 7.0  7.4  7.6   Total Bilirubin 0.0 - 1.2 mg/dL 0.7  0.7  0.8   Alkaline Phos 38 - 126 U/L 55  62  63   AST 15 - 41 U/L 20  18  16    ALT 0 - 44 U/L 12  10  9       RADIOGRAPHIC STUDIES: I have personally reviewed the radiological  images as listed and agreed with the findings in the report. IR IMAGING GUIDED PORT INSERTION Result Date: 05/01/2024 CLINICAL DATA:  Large B-cell lymphoma of the pelvis and need for porta cath to begin chemotherapy. EXAM: IMPLANTED PORT A CATH PLACEMENT WITH ULTRASOUND AND FLUOROSCOPIC GUIDANCE ANESTHESIA/SEDATION: Moderate (conscious) sedation was employed during this procedure. A total of Versed  1.0 mg and Fentanyl  50 mcg was administered intravenously. Moderate Sedation Time: 28 minutes. The patient's level of consciousness and vital signs were monitored continuously by radiology nursing throughout the procedure under my direct supervision. FLUOROSCOPY: Radiation Exposure Index: 1.9 mGy Kerma PROCEDURE: The procedure, risks, benefits, and alternatives were explained to the patient. Questions regarding the procedure were encouraged and  answered. The patient understands and consents to the procedure. A time-out was performed prior to initiating the procedure. Ultrasound was utilized to confirm patency of the right internal jugular vein. An ultrasound image was saved and recorded. The right neck and chest were prepped with chlorhexidine  in a sterile fashion, and a sterile drape was applied covering the operative field. Maximum barrier sterile technique with sterile gowns and gloves were used for the procedure. Local anesthesia was provided with 1% lidocaine . After creating a small venotomy incision, a 21 gauge needle was advanced into the right internal jugular vein under direct, real-time ultrasound guidance. Ultrasound image documentation was performed. After securing guidewire access, an 8 Fr dilator was placed. A J-wire was kinked to measure appropriate catheter length. A subcutaneous port pocket was then created along the upper chest wall utilizing sharp and blunt dissection. Portable cautery was utilized. The pocket was irrigated with sterile saline. A single lumen power injectable port was chosen for  placement. The 8 Fr catheter was tunneled from the port pocket site to the venotomy incision. The port was placed in the pocket. External catheter was trimmed to appropriate length based on guidewire measurement. At the venotomy, an 8 Fr peel-away sheath was placed over a guidewire. The catheter was then placed through the sheath and the sheath removed. Final catheter positioning was confirmed and documented with a fluoroscopic spot image. The port was accessed with a needle and aspirated and flushed with heparinized saline. The access needle was removed. The venotomy and port pocket incisions were closed with subcutaneous 3-0 Monocryl and subcuticular 4-0 Vicryl. Dermabond was applied to both incisions. COMPLICATIONS: COMPLICATIONS None FINDINGS: After catheter placement, the tip lies at the cavo-atrial junction. The catheter aspirates normally and is ready for immediate use. IMPRESSION: Placement of single lumen port a cath via right internal jugular vein. The catheter tip lies at the cavo-atrial junction. A power injectable port a cath was placed and is ready for immediate use. Electronically Signed   By: Marcey Moan M.D.   On: 05/01/2024 11:53   CT BONE MARROW BIOPSY & ASPIRATION Result Date: 05/01/2024 CLINICAL DATA:  Large B-cell lymphoma of pelvic sidewall and need for bone marrow biopsy. EXAM: CT GUIDED BONE MARROW ASPIRATION AND BIOPSY ANESTHESIA/SEDATION: Moderate (conscious) sedation was employed during this procedure. A total of Versed  2.0 mg and Fentanyl  100 mcg was administered intravenously. Moderate Sedation Time: 10 minutes. The patient's level of consciousness and vital signs were monitored continuously by radiology nursing throughout the procedure under my direct supervision. PROCEDURE: The procedure risks, benefits, and alternatives were explained to the patient. Questions regarding the procedure were encouraged and answered. The patient understands and consents to the procedure. A time  out was performed prior to initiating the procedure. The right gluteal region was prepped with chlorhexidine . Sterile gown and sterile gloves were used for the procedure. Local anesthesia was provided with 1% Lidocaine . Under CT guidance, an 11 gauge On Control bone cutting needle was advanced from a posterior approach into the right iliac bone. Needle positioning was confirmed with CT. Initial non heparinized and heparinized aspirate samples were obtained of bone marrow. Core biopsy was performed via the On Control drill needle. COMPLICATIONS: None FINDINGS: Inspection of initial aspirate did reveal visible particles. Intact core biopsy sample was obtained. IMPRESSION: CT guided bone marrow biopsy of right posterior iliac bone with both aspirate and core samples obtained. Electronically Signed   By: Marcey Moan M.D.   On: 05/01/2024 10:41   ECHOCARDIOGRAM COMPLETE  Result Date: 04/26/2024    ECHOCARDIOGRAM REPORT   Patient Name:   Stacey [name] Date of Exam: 04/26/2024 Medical Rec #:  [id number]          Height:       64.0 in Accession #:    [id number]         Weight:       170.3 lb Date of Birth:  20-Aug-1960          BSA:          1.827 m Patient Age:    63 years           BP:           121/67 mmHg Patient Gender: F                  HR:           54 bpm. Exam Location:  [facility] Procedure: 2D Echo, 3D Echo, Cardiac Doppler, Color Doppler and Strain Analysis            (Both Spectral and Color Flow Doppler were utilized during            procedure). Indications:     Chemo Z09  History:         Patient has no prior history of Echocardiogram examinations.  Sonographer:     Thea Norlander RCS Referring Phys:  [id number] ZHOU YU Diagnosing Phys: Evalene Lunger MD IMPRESSIONS  1. Left ventricular ejection fraction, by estimation, is 55 to 60%. The left ventricle has normal function. The left ventricle has no regional wall motion abnormalities. Left ventricular diastolic parameters are consistent with Grade I  diastolic dysfunction (impaired relaxation). The average left ventricular global longitudinal strain is -19.7 %. The global longitudinal strain is normal.  2. Right ventricular systolic function is normal. The right ventricular size is normal.  3. The mitral valve is normal in structure. Mild mitral valve regurgitation. No evidence of mitral stenosis.  4. The aortic valve is normal in structure. Aortic valve regurgitation is not visualized. No aortic stenosis is present.  5. The inferior vena cava is normal in size with greater than 50% respiratory variability, suggesting right atrial pressure of 3 mmHg. FINDINGS  Left Ventricle: Left ventricular ejection fraction, by estimation, is 55 to 60%. The left ventricle has normal function. The left ventricle has no regional wall motion abnormalities. The average left ventricular global longitudinal strain is -19.7 %. Strain was performed and the global longitudinal strain is normal. The left ventricular internal cavity size was normal in size. There is no left ventricular hypertrophy. Left ventricular diastolic parameters are consistent with Grade I diastolic dysfunction (impaired relaxation). Right Ventricle: The right ventricular size is normal. No increase in right ventricular wall thickness. Right ventricular systolic function is normal. Left Atrium: Left atrial size was normal in size. Right Atrium: Right atrial size was normal in size. Pericardium: There is no evidence of pericardial effusion. Mitral Valve: The mitral valve is normal in structure. Mild mitral valve regurgitation. No evidence of mitral valve stenosis. Tricuspid Valve: The tricuspid valve is normal in structure. Tricuspid valve regurgitation is trivial. No evidence of tricuspid stenosis. Aortic Valve: The aortic valve is normal in structure. Aortic valve regurgitation is not visualized. No aortic stenosis is present. Aortic valve peak gradient measures 5.2 mmHg. Pulmonic Valve: The pulmonic valve was  normal in structure. Pulmonic valve regurgitation is not visualized. No evidence of pulmonic stenosis. Aorta: The aortic root is normal in  size and structure. Venous: The inferior vena cava is normal in size with greater than 50% respiratory variability, suggesting right atrial pressure of 3 mmHg. IAS/Shunts: No atrial level shunt detected by color flow Doppler. Additional Comments: 3D was performed not requiring image post processing on an independent workstation and was indeterminate.  LEFT VENTRICLE PLAX 2D LVIDd:         4.10 cm   Diastology LVIDs:         3.10 cm   LV e' medial:  6.96 cm/s LV PW:         0.80 cm   LV e' lateral: 9.14 cm/s LV IVS:        0.70 cm LVOT diam:     2.40 cm   2D Longitudinal Strain LV SV:         79        2D Strain GLS Avg:     -19.7 % LV SV Index:   43 LVOT Area:     4.52 cm                           3D Volume EF:                          3D EF:        58 %                          LV EDV:       129 ml                          LV ESV:       55 ml                          LV SV:        74 ml RIGHT VENTRICLE             IVC RV S prime:     13.20 cm/s  IVC diam: 1.20 cm TAPSE (M-mode): 2.3 cm LEFT ATRIUM             Index        RIGHT ATRIUM           Index LA diam:        3.30 cm 1.81 cm/m   RA Area:     13.40 cm LA Vol (A2C):   35.3 ml 19.32 ml/m  RA Volume:   27.80 ml  15.21 ml/m LA Vol (A4C):   20.4 ml 11.16 ml/m LA Biplane Vol: 28.9 ml 15.82 ml/m  AORTIC VALVE AV Area (Vmax): 3.59 cm AV Vmax:        114.00 cm/s AV Peak Grad:   5.2 mmHg LVOT Vmax:      90.40 cm/s LVOT Vmean:     56.100 cm/s LVOT VTI:       0.175 m  AORTA Ao Root diam: 3.10 cm Ao Asc diam:  3.30 cm TRICUSPID VALVE TR Peak grad:   7.7 mmHg TR Vmax:        139.00 cm/s  SHUNTS Systemic VTI:  0.18 m Systemic Diam: 2.40 cm Evalene Lunger MD Electronically signed by Evalene Lunger MD Signature Date/Time: 04/26/2024/2:00:19 PM    Final    NM PET Image Initial (PI) Skull Base To Thigh (F-18 FDG)  Result Date:  04/25/2024 CLINICAL DATA:  Initial treatment strategy for new diagnosis of lymphoma. Known left pelvic soft tissue mass. EXAM: NUCLEAR MEDICINE PET SKULL BASE TO THIGH TECHNIQUE: 9.4 mCi F-18 FDG was injected intravenously. Full-ring PET imaging was performed from the skull base to thigh after the radiotracer. CT data was obtained and used for attenuation correction and anatomic localization. Fasting blood glucose: 86 mg/dl COMPARISON:  Abdominopelvic CTA and CT of 04/08/2024 FINDINGS: Mediastinal blood pool activity: SUV max 3.5 Liver activity: SUV max 4.0 NECK: Extensive hypermetabolic brown fat throughout the neck and upper chest. Given this limitation, no cervical nodal hypermetabolism identified. Incidental CT findings: Left carotid atherosclerosis. No cervical adenopathy. CHEST: No pulmonary parenchymal or thoracic nodal hypermetabolism. Incidental CT findings: Aortic and coronary artery calcification. Tiny right pleural effusion is new since the prior CTs. ABDOMEN/PELVIS: Mild splenic hypermetabolism relative to the liver (SUV 4.6). No splenomegaly. The posterior left pelvic sidewall mass on prior CTA is hypermetabolic. Difficult to measure on noncontrast CT, estimated at 4.5 x 3.4 cm and a S.U.V. max of 14.1 on 127/6. No separate areas of abdominopelvic nodal hypermetabolism. Incidental CT findings: Left ureteric stent in place, without significant hydronephrosis. SKELETON: No abnormal marrow activity. Incidental CT findings: Mild degenerative changes of both hips. Bilateral shoulder arthroplasties. IMPRESSION: 1. The posterior left pelvic sidewall mass is hypermetabolic, consistent with active lymphoma. 2. Mild splenic hypermetabolism relative to the liver. No splenomegaly. Cannot exclude lymphomatous involvement. 3. New small right pleural effusion. 4. No active lymphoma within the chest or neck. Mildly decreased sensitivity exam secondary to hypermetabolic brown fat. 5. Age advanced coronary artery  atherosclerosis. Recommend assessment of coronary risk factors. 6.  Aortic Atherosclerosis (ICD10-I70.0). Electronically Signed   By: Rockey Kilts M.D.   On: 04/25/2024 08:59

## 2024-05-12 NOTE — Progress Notes (Signed)
 Pt. Restless and reports feeling jittery. Just received Compazine IV about 20 minutes ago. Notified Morna Husband, NP and ok with letting the compazine where off a little to see if the restlessness and jitters go away. Let pt know the jitters and restlessness is most likely a side effect of the Compazine and it should wear off. Pt verbalized understanding.

## 2024-05-12 NOTE — Patient Instructions (Signed)
 Prochlorperazine Injection What is this medication? PROCHLORPERAZINE (proe klor PER a zeen) treats nausea and vomiting. It works by blocking substances in your body that may cause nausea and vomiting. It may also be used to treat schizophrenia. It works by balancing the levels of dopamine in your brain, a substance that helps regulate mood, behaviors, and thoughts. This medicine may be used for other purposes; ask your health care provider or pharmacist if you have questions. COMMON BRAND NAME(S): Compazine, Compazine Solution What should I tell my care team before I take this medication? They need to know if you have any of these conditions: Blockage in your bowel Brain tumor Dementia Diabetes Difficulty swallowing Frequently drink alcohol Glaucoma Have trouble controlling your muscles Head injury Heart disease History of irregular heartbeat Liver disease Low blood counts, such as low white cell, platelet, or red cell counts Low blood pressure Lung or breathing disease, such as asthma Parkinson disease Prostate disease Seizures Trouble passing urine An unusual or allergic reaction to prochlorperazine, other medications, foods, dyes, or preservatives Pregnant or trying to get pregnant Breast-feeding How should I use this medication? This medication is for injection into a muscle or into a vein. It is given in a hospital or clinic setting. Talk to your care team about the use of this medication in children. While this medication may be prescribed for children as young as 32 years of age for selected conditions, precautions do apply. Overdosage: If you think you have taken too much of this medicine contact a poison control center or emergency room at once. NOTE: This medicine is only for you. Do not share this medicine with others. What if I miss a dose? This does not apply. What may interact with this medication? Do not take this medication with any of the  following: Cisapride Dofetilide Dronedarone Metoclopramide  Pimozide Saquinavir Thioridazine This medication may also interact with the following: Alcohol Antihistamines for allergy, cough, and cold Atropine Certain medications for anxiety or sleep Certain medications for bladder problems, such as oxybutynin, tolterodine Certain medications for depression, such as amitriptyline, fluoxetine, sertraline Certain medications for stomach problems, such as dicyclomine, hyoscyamine Certain medications for travel sickness, such as scopolamine  Epinephrine General anesthetics, such as halothane, isoflurane, methoxyflurane, propofol  Ipratropium Levodopa or other medications for Parkinson disease Lithium Medications for blood pressure Medications for seizures, such as phenobarbital, primidone, phenytoin Medications that relax muscles for surgery Opioid medications for pain Propranolol Warfarin This list may not describe all possible interactions. Give your health care provider a list of all the medicines, herbs, non-prescription drugs, or dietary supplements you use. Also tell them if you smoke, drink alcohol, or use illegal drugs. Some items may interact with your medicine. What should I watch for while using this medication? Your condition will be monitored carefully while you are receiving this medication. This medication may affect your coordination, reaction time, or judgment. Do not drive or operate machinery until you know how this medication affects you. Sit up or stand slowly to reduce the risk of dizzy or fainting spells. Drinking alcohol with this medication can increase the risk of these side effects. This medication can cause problems with controlling your body temperature. It can lower the response of your body to cold temperatures. If possible, stay indoors during cold weather. If you must go outdoors, wear warm clothes. It can also lower the response of your body to heat. Do not  overheat. Do not over-exercise. Stay out of the sun when possible. If you must be in  the sun, wear cool clothing. Drink plenty of water . If you have trouble controlling your body temperature, call your care team right away. This medication may increase blood sugar. The risk may be higher in patients who already have diabetes. Ask your care team what you can do to lower your risk of diabetes while taking this medication. This medication can make you more sensitive to the sun. Keep out of the sun. If you cannot avoid being in the sun, wear protective clothing and use sunscreen. Do not use sun lamps or tanning beds/booths. Your mouth may get dry. Chewing sugarless gum or sucking hard candy, and drinking plenty of water  may help. Contact your care team if the problem does not go away or is severe. What side effects may I notice from receiving this medication? Side effects that you should report to your care team as soon as possible: Allergic reactions--skin rash, itching, hives, swelling of the face, lips, tongue, or throat High fever, stiff muscles, increased sweating, fast or irregular heartbeat, and confusion, which may be signs of neuroleptic malignant syndrome High prolactin level--unexpected breast tissue growth, discharge from the nipple, change in sex drive or performance, irregular menstrual cycle Infection--fever, chills, cough, or sore throat Low blood pressure--dizziness, feeling faint or lightheaded, blurry vision Uncontrolled and repetitive body movements, muscle stiffness or spasms, tremors or shaking, loss of balance or coordination, restlessness, shuffling walk, which may be signs of extrapyramidal symptoms (EPS) Side effects that usually do not require medical attention (report to your care team if they continue or are bothersome): Constipation Dizziness Drowsiness Dry mouth Pain, redness, or irritation at injection site This list may not describe all possible side effects. Call your  doctor for medical advice about side effects. You may report side effects to FDA at 1-800-FDA-1088. Where should I keep my medication? This medication is given in a hospital or clinic. It will not be stored at home. NOTE: This sheet is a summary. It may not cover all possible information. If you have questions about this medicine, talk to your doctor, pharmacist, or health care provider.  2024 Elsevier/Gold Standard (2021-06-13 00:00:00)

## 2024-05-12 NOTE — Telephone Encounter (Signed)
 Oxycontin  denied because pt has not tried and failed 2 formularies. Did you want to try an alternative?   Formulary alterantive drugs are:  Hydromorphone  extended release tablets( generic Exalgo ) Morphine  sulfate extended release tablets (generic MS contin ) Xtampza  extended release capsules

## 2024-05-12 NOTE — Telephone Encounter (Signed)
 PA for OxyContin  15 mg Q12H was denied.    Details of this decision are provided on the physician outcome notice which has been faxed to the number on file

## 2024-05-14 ENCOUNTER — Encounter: Payer: Self-pay | Admitting: Oncology

## 2024-05-16 ENCOUNTER — Encounter: Payer: Self-pay | Admitting: Oncology

## 2024-05-16 ENCOUNTER — Other Ambulatory Visit: Payer: Self-pay

## 2024-05-16 MED ORDER — STERILE WATER FOR INJECTION IJ SOLN
5.0000 mL | Freq: Four times a day (QID) | OROMUCOSAL | 1 refills | Status: AC | PRN
  Filled 2024-05-16: qty 480, 12d supply, fill #0

## 2024-05-17 ENCOUNTER — Emergency Department

## 2024-05-17 ENCOUNTER — Inpatient Hospital Stay
Admission: EM | Admit: 2024-05-17 | Discharge: 2024-05-24 | DRG: 698 | Disposition: A | Discharge status: Home / Self Care | Attending: Student | Admitting: Student

## 2024-05-17 ENCOUNTER — Other Ambulatory Visit: Payer: Self-pay

## 2024-05-17 ENCOUNTER — Encounter: Payer: Self-pay | Admitting: *Deleted

## 2024-05-17 ENCOUNTER — Inpatient Hospital Stay: Payer: Self-pay | Attending: Oncology | Admitting: Hospice and Palliative Medicine

## 2024-05-17 ENCOUNTER — Telehealth: Payer: Self-pay

## 2024-05-17 ENCOUNTER — Encounter: Payer: Self-pay | Admitting: Oncology

## 2024-05-17 DIAGNOSIS — Z1152 Encounter for screening for COVID-19: Secondary | ICD-10-CM

## 2024-05-17 DIAGNOSIS — I2693 Single subsegmental pulmonary embolism without acute cor pulmonale: Secondary | ICD-10-CM | POA: Diagnosis present

## 2024-05-17 DIAGNOSIS — I2699 Other pulmonary embolism without acute cor pulmonale: Secondary | ICD-10-CM | POA: Diagnosis not present

## 2024-05-17 DIAGNOSIS — T451X5A Adverse effect of antineoplastic and immunosuppressive drugs, initial encounter: Secondary | ICD-10-CM | POA: Diagnosis present

## 2024-05-17 DIAGNOSIS — Z8249 Family history of ischemic heart disease and other diseases of the circulatory system: Secondary | ICD-10-CM | POA: Diagnosis not present

## 2024-05-17 DIAGNOSIS — Z79899 Other long term (current) drug therapy: Secondary | ICD-10-CM | POA: Diagnosis not present

## 2024-05-17 DIAGNOSIS — G893 Neoplasm related pain (acute) (chronic): Secondary | ICD-10-CM | POA: Diagnosis not present

## 2024-05-17 DIAGNOSIS — A4181 Sepsis due to Enterococcus: Secondary | ICD-10-CM | POA: Diagnosis present

## 2024-05-17 DIAGNOSIS — D61810 Antineoplastic chemotherapy induced pancytopenia: Secondary | ICD-10-CM | POA: Diagnosis present

## 2024-05-17 DIAGNOSIS — R5081 Fever presenting with conditions classified elsewhere: Secondary | ICD-10-CM | POA: Diagnosis present

## 2024-05-17 DIAGNOSIS — T83592A Infection and inflammatory reaction due to indwelling ureteral stent, initial encounter: Secondary | ICD-10-CM | POA: Diagnosis present

## 2024-05-17 DIAGNOSIS — D849 Immunodeficiency, unspecified: Secondary | ICD-10-CM | POA: Diagnosis present

## 2024-05-17 DIAGNOSIS — C83398 Diffuse large b-cell lymphoma of other extranodal and solid organ sites: Secondary | ICD-10-CM | POA: Insufficient documentation

## 2024-05-17 DIAGNOSIS — D709 Neutropenia, unspecified: Secondary | ICD-10-CM | POA: Diagnosis present

## 2024-05-17 DIAGNOSIS — C8330 Diffuse large B-cell lymphoma, unspecified site: Secondary | ICD-10-CM

## 2024-05-17 DIAGNOSIS — Z515 Encounter for palliative care: Secondary | ICD-10-CM | POA: Insufficient documentation

## 2024-05-17 DIAGNOSIS — D649 Anemia, unspecified: Secondary | ICD-10-CM | POA: Diagnosis not present

## 2024-05-17 DIAGNOSIS — R634 Abnormal weight loss: Secondary | ICD-10-CM | POA: Insufficient documentation

## 2024-05-17 DIAGNOSIS — Y732 Prosthetic and other implants, materials and accessory gastroenterology and urology devices associated with adverse incidents: Secondary | ICD-10-CM | POA: Diagnosis present

## 2024-05-17 DIAGNOSIS — N1330 Unspecified hydronephrosis: Secondary | ICD-10-CM | POA: Diagnosis not present

## 2024-05-17 DIAGNOSIS — B952 Enterococcus as the cause of diseases classified elsewhere: Secondary | ICD-10-CM | POA: Diagnosis not present

## 2024-05-17 DIAGNOSIS — R5383 Other fatigue: Secondary | ICD-10-CM | POA: Insufficient documentation

## 2024-05-17 DIAGNOSIS — M109 Gout, unspecified: Secondary | ICD-10-CM | POA: Diagnosis present

## 2024-05-17 DIAGNOSIS — N136 Pyonephrosis: Secondary | ICD-10-CM | POA: Diagnosis present

## 2024-05-17 DIAGNOSIS — A419 Sepsis, unspecified organism: Secondary | ICD-10-CM

## 2024-05-17 DIAGNOSIS — Z7901 Long term (current) use of anticoagulants: Secondary | ICD-10-CM | POA: Insufficient documentation

## 2024-05-17 DIAGNOSIS — C8336 Diffuse large B-cell lymphoma, intrapelvic lymph nodes: Secondary | ICD-10-CM | POA: Diagnosis present

## 2024-05-17 DIAGNOSIS — D696 Thrombocytopenia, unspecified: Secondary | ICD-10-CM | POA: Diagnosis not present

## 2024-05-17 DIAGNOSIS — D61818 Other pancytopenia: Secondary | ICD-10-CM | POA: Diagnosis not present

## 2024-05-17 DIAGNOSIS — R531 Weakness: Secondary | ICD-10-CM

## 2024-05-17 DIAGNOSIS — D6959 Other secondary thrombocytopenia: Secondary | ICD-10-CM | POA: Diagnosis present

## 2024-05-17 DIAGNOSIS — M1990 Unspecified osteoarthritis, unspecified site: Secondary | ICD-10-CM | POA: Diagnosis present

## 2024-05-17 DIAGNOSIS — G8929 Other chronic pain: Secondary | ICD-10-CM | POA: Diagnosis present

## 2024-05-17 DIAGNOSIS — D701 Agranulocytosis secondary to cancer chemotherapy: Secondary | ICD-10-CM | POA: Diagnosis present

## 2024-05-17 DIAGNOSIS — Z96611 Presence of right artificial shoulder joint: Secondary | ICD-10-CM | POA: Diagnosis present

## 2024-05-17 DIAGNOSIS — K219 Gastro-esophageal reflux disease without esophagitis: Secondary | ICD-10-CM | POA: Diagnosis present

## 2024-05-17 DIAGNOSIS — Z5111 Encounter for antineoplastic chemotherapy: Secondary | ICD-10-CM | POA: Insufficient documentation

## 2024-05-17 DIAGNOSIS — Z7952 Long term (current) use of systemic steroids: Secondary | ICD-10-CM | POA: Insufficient documentation

## 2024-05-17 DIAGNOSIS — Z5189 Encounter for other specified aftercare: Secondary | ICD-10-CM | POA: Insufficient documentation

## 2024-05-17 DIAGNOSIS — R1900 Intra-abdominal and pelvic swelling, mass and lump, unspecified site: Secondary | ICD-10-CM | POA: Diagnosis not present

## 2024-05-17 DIAGNOSIS — N390 Urinary tract infection, site not specified: Secondary | ICD-10-CM

## 2024-05-17 DIAGNOSIS — Z96612 Presence of left artificial shoulder joint: Secondary | ICD-10-CM | POA: Diagnosis present

## 2024-05-17 DIAGNOSIS — Z79891 Long term (current) use of opiate analgesic: Secondary | ICD-10-CM | POA: Insufficient documentation

## 2024-05-17 DIAGNOSIS — Z5112 Encounter for antineoplastic immunotherapy: Secondary | ICD-10-CM | POA: Insufficient documentation

## 2024-05-17 DIAGNOSIS — G620 Drug-induced polyneuropathy: Secondary | ICD-10-CM | POA: Insufficient documentation

## 2024-05-17 DIAGNOSIS — R600 Localized edema: Secondary | ICD-10-CM | POA: Diagnosis not present

## 2024-05-17 DIAGNOSIS — C8590 Non-Hodgkin lymphoma, unspecified, unspecified site: Secondary | ICD-10-CM

## 2024-05-17 LAB — APTT: aPTT: 26 s (ref 24–36)

## 2024-05-17 LAB — CBC WITH DIFFERENTIAL/PLATELET
Abs Immature Granulocytes: 0 K/uL (ref 0.00–0.07)
Basophils Absolute: 0 K/uL (ref 0.0–0.1)
Basophils Relative: 0 %
Eosinophils Absolute: 0 K/uL (ref 0.0–0.5)
Eosinophils Relative: 0 %
HCT: 35.6 % — ABNORMAL LOW (ref 36.0–46.0)
Hemoglobin: 12.5 g/dL (ref 12.0–15.0)
Immature Granulocytes: 0 %
Lymphocytes Relative: 82 %
Lymphs Abs: 0.1 K/uL — ABNORMAL LOW (ref 0.7–4.0)
MCH: 30.9 pg (ref 26.0–34.0)
MCHC: 35.1 g/dL (ref 30.0–36.0)
MCV: 88.1 fL (ref 80.0–100.0)
Monocytes Absolute: 0 K/uL — ABNORMAL LOW (ref 0.1–1.0)
Monocytes Relative: 9 %
Neutro Abs: 0 K/uL — CL (ref 1.7–7.7)
Neutrophils Relative %: 9 %
Platelets: 58 K/uL — ABNORMAL LOW (ref 150–400)
RBC: 4.04 MIL/uL (ref 3.87–5.11)
RDW: 12.2 % (ref 11.5–15.5)
WBC: 0.1 K/uL — CL (ref 4.0–10.5)
nRBC: 0 % (ref 0.0–0.2)

## 2024-05-17 LAB — COMPREHENSIVE METABOLIC PANEL WITH GFR
ALT: 10 U/L (ref 0–44)
AST: 16 U/L (ref 15–41)
Albumin: 3.9 g/dL (ref 3.5–5.0)
Alkaline Phosphatase: 64 U/L (ref 38–126)
Anion gap: 11 (ref 5–15)
BUN: 12 mg/dL (ref 8–23)
CO2: 24 mmol/L (ref 22–32)
Calcium: 9 mg/dL (ref 8.9–10.3)
Chloride: 95 mmol/L — ABNORMAL LOW (ref 98–111)
Creatinine, Ser: 0.43 mg/dL — ABNORMAL LOW (ref 0.44–1.00)
GFR, Estimated: >60 mL/min (ref >60)
Glucose, Bld: 154 mg/dL — ABNORMAL HIGH (ref 70–99)
Potassium: 4 mmol/L (ref 3.5–5.1)
Sodium: 130 mmol/L — ABNORMAL LOW (ref 135–145)
Total Bilirubin: 1.3 mg/dL — ABNORMAL HIGH (ref 0.0–1.2)
Total Protein: 7.5 g/dL (ref 6.5–8.1)

## 2024-05-17 LAB — URINALYSIS, W/ REFLEX TO CULTURE (INFECTION SUSPECTED)
Bacteria, UA: NONE SEEN
Bilirubin Urine: NEGATIVE
Glucose, UA: NEGATIVE mg/dL
Ketones, ur: 80 mg/dL — AB
Leukocytes,Ua: NEGATIVE
Nitrite: NEGATIVE
Protein, ur: 100 mg/dL — AB
RBC / HPF: >50 RBC/hpf (ref 0–5)
Specific Gravity, Urine: 1.016 (ref 1.005–1.030)
pH: 8 (ref 5.0–8.0)

## 2024-05-17 LAB — LACTIC ACID, PLASMA
Lactic Acid, Venous: 1.7 mmol/L (ref 0.5–1.9)
Lactic Acid, Venous: 0.9 mmol/L (ref 0.5–1.9)

## 2024-05-17 LAB — TROPONIN I (HIGH SENSITIVITY): Troponin I (High Sensitivity): 4 ng/L (ref <18)

## 2024-05-17 LAB — PROTIME-INR
INR: 0.9 (ref 0.8–1.2)
Prothrombin Time: 12.9 s (ref 11.4–15.2)

## 2024-05-17 LAB — RESP PANEL BY RT-PCR (RSV, FLU A&B, COVID)  RVPGX2
Influenza A by PCR: NEGATIVE
Influenza B by PCR: NEGATIVE
Resp Syncytial Virus by PCR: NEGATIVE
SARS Coronavirus 2 by RT PCR: NEGATIVE

## 2024-05-17 LAB — MAGNESIUM: Magnesium: 2 mg/dL (ref 1.7–2.4)

## 2024-05-17 LAB — PHOSPHORUS: Phosphorus: 3.3 mg/dL (ref 2.5–4.6)

## 2024-05-17 MED ORDER — LACTATED RINGERS IV SOLN
150.0000 mL/h | INTRAVENOUS | Status: AC
  Administered 2024-05-17 – 2024-05-18 (×3): 150 mL/h via INTRAVENOUS

## 2024-05-17 MED ORDER — HEPARIN (PORCINE) 25000 UT/250ML-% IV SOLN
1450.0000 [IU]/h | INTRAVENOUS | Status: DC
  Administered 2024-05-17: 1150 [IU]/h via INTRAVENOUS
  Administered 2024-05-19: 1450 [IU]/h via INTRAVENOUS
  Filled 2024-05-17 (×3): qty 250

## 2024-05-17 MED ORDER — HEPARIN BOLUS VIA INFUSION
4100.0000 [IU] | Freq: Once | INTRAVENOUS | Status: AC
  Administered 2024-05-17: 4100 [IU] via INTRAVENOUS
  Filled 2024-05-17: qty 4100

## 2024-05-17 MED ORDER — ONDANSETRON HCL 4 MG/2ML IJ SOLN
4.0000 mg | Freq: Four times a day (QID) | INTRAMUSCULAR | Status: DC | PRN
  Administered 2024-05-17: 4 mg via INTRAVENOUS
  Filled 2024-05-17: qty 2

## 2024-05-17 MED ORDER — SODIUM CHLORIDE 0.9% FLUSH
3.0000 mL | Freq: Two times a day (BID) | INTRAVENOUS | Status: DC
  Administered 2024-05-17 – 2024-05-24 (×14): 3 mL via INTRAVENOUS

## 2024-05-17 MED ORDER — HYDROCODONE-ACETAMINOPHEN 5-325 MG PO TABS
1.0000 | ORAL_TABLET | ORAL | Status: DC | PRN
  Administered 2024-05-17: 2 via ORAL
  Filled 2024-05-17: qty 2

## 2024-05-17 MED ORDER — MORPHINE SULFATE (PF) 4 MG/ML IV SOLN
4.0000 mg | Freq: Once | INTRAVENOUS | Status: AC
  Administered 2024-05-17: 4 mg via INTRAVENOUS
  Filled 2024-05-17: qty 1

## 2024-05-17 MED ORDER — ENSURE PLUS HIGH PROTEIN PO LIQD
237.0000 mL | Freq: Two times a day (BID) | ORAL | Status: DC
  Administered 2024-05-18 – 2024-05-24 (×13): 237 mL via ORAL

## 2024-05-17 MED ORDER — BISACODYL 5 MG PO TBEC
5.0000 mg | DELAYED_RELEASE_TABLET | Freq: Every day | ORAL | Status: DC | PRN
  Administered 2024-05-19 – 2024-05-20 (×2): 5 mg via ORAL
  Filled 2024-05-17 (×2): qty 1

## 2024-05-17 MED ORDER — SODIUM CHLORIDE 0.9 % IV SOLN
2.0000 g | Freq: Once | INTRAVENOUS | Status: AC
  Administered 2024-05-17: 2 g via INTRAVENOUS
  Filled 2024-05-17: qty 20

## 2024-05-17 MED ORDER — SODIUM CHLORIDE 0.9 % IV SOLN
2.0000 g | Freq: Three times a day (TID) | INTRAVENOUS | Status: DC
  Administered 2024-05-17 – 2024-05-18 (×3): 2 g via INTRAVENOUS
  Filled 2024-05-17 (×4): qty 12.5

## 2024-05-17 MED ORDER — SODIUM CHLORIDE 0.9 % IV SOLN: 2.0000 g | Freq: Once | INTRAVENOUS | Status: DC

## 2024-05-17 MED ORDER — ONDANSETRON HCL 4 MG/2ML IJ SOLN
4.0000 mg | Freq: Once | INTRAMUSCULAR | Status: AC
  Administered 2024-05-17: 4 mg via INTRAVENOUS
  Filled 2024-05-17: qty 2

## 2024-05-17 MED ORDER — SODIUM CHLORIDE 0.9% FLUSH
10.0000 mL | INTRAVENOUS | Status: DC | PRN
  Administered 2024-05-18: 10 mL

## 2024-05-17 MED ORDER — HYDRALAZINE HCL 20 MG/ML IJ SOLN: 5.0000 mg | Freq: Four times a day (QID) | INTRAMUSCULAR | Status: DC | PRN

## 2024-05-17 MED ORDER — TRAZODONE HCL 50 MG PO TABS
25.0000 mg | ORAL_TABLET | Freq: Every evening | ORAL | Status: DC | PRN
Start: 2024-05-17 — End: 2024-05-24
  Administered 2024-05-19 – 2024-05-23 (×5): 25 mg via ORAL
  Filled 2024-05-17 (×5): qty 1

## 2024-05-17 MED ORDER — ONDANSETRON HCL 4 MG PO TABS: 4.0000 mg | ORAL_TABLET | Freq: Four times a day (QID) | ORAL | Status: DC | PRN

## 2024-05-17 MED ORDER — ACETAMINOPHEN 325 MG PO TABS
650.0000 mg | ORAL_TABLET | Freq: Four times a day (QID) | ORAL | Status: DC | PRN
  Administered 2024-05-18 – 2024-05-19 (×3): 650 mg via ORAL
  Filled 2024-05-17 (×3): qty 2

## 2024-05-17 MED ORDER — LIDOCAINE-PRILOCAINE 2.5-2.5 % EX CREA
TOPICAL_CREAM | Freq: Once | CUTANEOUS | Status: AC
  Filled 2024-05-17: qty 5

## 2024-05-17 MED ORDER — CHLORHEXIDINE GLUCONATE CLOTH 2 % EX PADS
6.0000 | MEDICATED_PAD | Freq: Every day | CUTANEOUS | Status: DC
  Administered 2024-05-18 – 2024-05-24 (×7): 6 via TOPICAL

## 2024-05-17 MED ORDER — MORPHINE SULFATE (PF) 2 MG/ML IV SOLN
1.0000 mg | Freq: Four times a day (QID) | INTRAVENOUS | Status: DC | PRN
  Administered 2024-05-17 – 2024-05-18 (×3): 1 mg via INTRAVENOUS
  Filled 2024-05-17 (×3): qty 1

## 2024-05-17 MED ORDER — PHENAZOPYRIDINE HCL 100 MG PO TABS
100.0000 mg | ORAL_TABLET | Freq: Three times a day (TID) | ORAL | Status: AC
  Administered 2024-05-18 – 2024-05-19 (×6): 100 mg via ORAL
  Filled 2024-05-17 (×6): qty 1

## 2024-05-17 MED ORDER — SODIUM CHLORIDE 0.9% FLUSH
10.0000 mL | Freq: Two times a day (BID) | INTRAVENOUS | Status: DC
  Administered 2024-05-18 – 2024-05-24 (×13): 10 mL

## 2024-05-17 MED ORDER — ACETAMINOPHEN 650 MG RE SUPP: 650.0000 mg | Freq: Four times a day (QID) | RECTAL | Status: DC | PRN

## 2024-05-17 MED ORDER — SENNOSIDES-DOCUSATE SODIUM 8.6-50 MG PO TABS
1.0000 | ORAL_TABLET | Freq: Every evening | ORAL | Status: DC | PRN
  Administered 2024-05-19: 1 via ORAL
  Filled 2024-05-17: qty 1

## 2024-05-17 MED ORDER — IOHEXOL 300 MG/ML  SOLN
100.0000 mL | Freq: Once | INTRAMUSCULAR | Status: AC | PRN
  Administered 2024-05-17: 100 mL via INTRAVENOUS

## 2024-05-17 NOTE — ED Triage Notes (Signed)
 Pt to triage via wheelchair.   Pt has a fever and dysuria.  Pt had chemo last week.  Pt has a port.  Hx lymphoma.  Sx began this am.  Denies n/v

## 2024-05-17 NOTE — ED Notes (Signed)
 Pt cleaned, gown and underwear applied, bed cleaned and changed, warm blankets provided, stretcher low and locked, pt reports no further needs at this time.

## 2024-05-17 NOTE — Telephone Encounter (Signed)
 Returned patient call for additional information.  Patient states that her temp is running 100.4 or 100.5 and is having a lot of dysuria.  Stacey [name] sounded like she was distressed with SOBr and difficulty breathing during phone conversation.  Advised patient that ED visit for evaluation of symptoms seemed appropriate.  She was headed in to to the ED as we spoke.

## 2024-05-17 NOTE — Progress Notes (Signed)
 PHARMACY - ANTICOAGULATION CONSULT NOTE  Pharmacy Consult for Heparin  Indication: pulmonary embolus  Allergies  Allergen Reactions   Propofol  Nausea Only    Patient states medication can be tolerated with an antiemetic before taking    Patient Measurements: Height: 5' 4 (162.6 cm) Weight: 72.6 kg (160 lb) IBW/kg (Calculated) : 54.7 HEPARIN DW (KG): 69.6  Vital Signs: Temp: 98.7 F (37.1 C) (11/05 2040) Temp Source: Oral (11/05 2040) BP: 139/83 (11/05 2040) Pulse Rate: 101 (11/05 2040)  Labs: Recent Labs    05/17/24 1613  HGB 12.5  HCT 35.6*  PLT 58*  LABPROT 12.9  INR 0.9  CREATININE 0.43*  TROPONINIHS 4    Estimated Creatinine Clearance: 72.2 mL/min (A) (by C-G formula based on SCr of 0.43 mg/dL (L)).   Medical History: Past Medical History:  Diagnosis Date   Arthritis    left shoulder, neck, lower back  (01/11/2018)   Chronic lower back pain    Family history of adverse reaction to anesthesia    Mother has nausea   GERD (gastroesophageal reflux disease)    PONV (postoperative nausea and vomiting)    Pre-diabetes    UTI (urinary tract infection) 03/20/2021    Medications:  Medications Prior to Admission  Medication Sig Dispense Refill Last Dose/Taking   acetaminophen  (TYLENOL ) 500 MG tablet Take 500 mg by mouth every 6 (six) hours as needed for mild pain (pain score 1-3).   05/17/2024   acyclovir (ZOVIRAX) 400 MG tablet Take 1 tablet (400 mg total) by mouth daily. 30 tablet 3 05/17/2024   allopurinol (ZYLOPRIM) 300 MG tablet Take 1 tablet (300 mg total) by mouth daily. 30 tablet 3 05/17/2024   fentaNYL  (DURAGESIC ) 25 MCG/HR Place 1 patch onto the skin every 3 (three) days. 5 patch 0 Taking   gabapentin  (NEURONTIN ) 100 MG capsule Take 1 capsule (100 mg total) by mouth 3 (three) times daily. 90 capsule 0 05/17/2024   lidocaine -prilocaine (EMLA) cream Apply to affected area once 30 g 3 Unknown   loratadine (CLARITIN) 10 MG tablet Take 10 mg by mouth at  bedtime.   05/16/2024 Bedtime   magic mouthwash (multi-ingredient) oral suspension Swish and swallow 5-10 mLs 4 (four) times daily as needed for mouth pain. 480 mL 1 Unknown   methocarbamol  (ROBAXIN ) 500 MG tablet Take 500 mg by mouth every 8 (eight) hours as needed for muscle spasms.  0 Unknown   omeprazole  (PRILOSEC) 20 MG capsule TAKE 1 CAPSULE (20 MG TOTAL) BY MOUTH 2 (TWO) TIMES DAILY BEFORE A MEAL. 180 capsule 0 Unknown   ondansetron  (ZOFRAN ) 8 MG tablet Take 1 tablet (8 mg total) by mouth every 8 (eight) hours as needed for nausea or vomiting. Start on the third day after cyclophosphamide chemotherapy. 30 tablet 1 Unknown   oxybutynin (DITROPAN-XL) 10 MG 24 hr tablet Take 1 tablet (10 mg total) by mouth daily. 30 tablet 0 05/17/2024   oxyCODONE  (OXY IR/ROXICODONE ) 5 MG immediate release tablet Take 1 tablet (5 mg total) by mouth every 4 (four) hours as needed. 90 tablet 0 Unknown   predniSONE  (DELTASONE ) 20 MG tablet Take 5 tablets (100 mg total) by mouth daily. Take with food on days 2-5 of chemotherapy. 20 tablet 5 Past Month   prochlorperazine (COMPAZINE) 10 MG tablet Take 1 tablet (10 mg total) by mouth every 6 (six) hours as needed for nausea or vomiting. 30 tablet 6 Unknown   sodium phosphate  (FLEET) ENEM Place 133 mLs (1 enema total) rectally daily as needed  for severe constipation. 3 mL 0 Unknown   tamsulosin (FLOMAX) 0.4 MG CAPS capsule Take 1 capsule (0.4 mg total) by mouth daily. 30 capsule 0 05/17/2024    Assessment: Pharmacy consulted to dose heparin in this 63 year old female admitted with PE.  No prior anticoag noted. CrCl = 72.2 ml/min   Goal of Therapy:  Heparin level 0.3-0.7 units/ml Monitor platelets by anticoagulation protocol: Yes   Plan:  Give 4100 units bolus x 1 Start heparin infusion at 1150 units/hr Check anti-Xa level in 6 hours and daily while on heparin Continue to monitor H&H and platelets  Juliannah Ohmann D 05/17/2024,10:02 PM

## 2024-05-17 NOTE — ED Notes (Addendum)
 Pt arrived with Fentanyl  patch on lower left back. MD removed.

## 2024-05-17 NOTE — H&P (Signed)
 History and Physical   [facility] - [place] @ [facility] Admission History and Physical [employer], D.O.    Patient Name: Stacey [name] MR#: [id number] Date of Birth: Dec 20, 1960 Date of Admission: 05/17/2024  Referring MD/NP/PA: Dr. Myer Price Primary Care Physician: [name], PA-C  Chief Complaint:  Chief Complaint  Patient presents with   Dysuria   Back Pain    HPI: Stacey [name] is a 63 y.o. female with a known history of diffuse large B-cell lymphoma on chemotherapy, last treatment last week followed by Dr. Babara, left ureteral stent arthritis, GERD presents to the emergency department for evaluation of fever and dysuria.  Patient was in a usual state of health until last night when she reports the onset of dysuria as well as suprapubic and bilateral low back pain.  She developed fever this morning..  Of note in September 2025 CT angiogram showed an  obstructing mass along the left pelvic sidewall causing hydroureter and hydronephrosis.  At that time stent placed by urologist  Patient denies weakness, dizziness, chest pain, shortness of breath, N/V/C/D, frequency, changes in mental status.    Otherwise there has been no change in status. Patient has been taking medication as prescribed and there has been no recent change in medication or diet.  No recent antibiotics.  There has been no recent illness, hospitalizations, travel or sick contacts.    EMS/ED Course: Patient received Rocephin, morphine , Zofran . Medical admission has been requested for further management of febrile neutropenia, sepsis secondary to urinary tract infection which is complicated by a left-sided ureteral stent as well as incidental finding of right lower lobe PE.  Dr. Rennie of oncology was notified the patient will be admitted  Review of Systems:  CONSTITUTIONAL: Positive fever/chills, fatigue, negative weakness, weight gain/loss, headache. EYES: No blurry or double vision. ENT: No  tinnitus, postnasal drip, redness or soreness of the oropharynx. RESPIRATORY: No cough, dyspnea, wheeze.  No hemoptysis.  CARDIOVASCULAR: No chest pain, palpitations, syncope, orthopnea. No lower extremity edema.  GASTROINTESTINAL: No nausea, vomiting, abdominal pain, diarrhea, constipation.  No hematemesis, melena or hematochezia. GENITOURINARY: Positive dysuria, negative frequency, hematuria. ENDOCRINE: No polyuria or nocturia. No heat or cold intolerance. HEMATOLOGY: No anemia, bruising, bleeding. INTEGUMENTARY: No rashes, ulcers, lesions. MUSCULOSKELETAL: Positive low back pain.  No arthritis, gout. NEUROLOGIC: No numbness, tingling, ataxia, seizure-type activity, weakness. PSYCHIATRIC: No anxiety, depression, insomnia.   Past Medical History:  Diagnosis Date   Arthritis    left shoulder, neck, lower back  (01/11/2018)   Chronic lower back pain    Family history of adverse reaction to anesthesia    Mother has nausea   GERD (gastroesophageal reflux disease)    PONV (postoperative nausea and vomiting)    Pre-diabetes    UTI (urinary tract infection) 03/20/2021    Past Surgical History:  Procedure Laterality Date   APPENDECTOMY     CYSTOSCOPY W/ URETERAL STENT PLACEMENT Left 04/08/2024   Procedure: CYSTOSCOPY, WITH RETROGRADE PYELOGRAM AND URETERAL STENT INSERTION;  Surgeon: Watt Rush, MD;  Location: [facility] ORS;  Service: Urology;  Laterality: Left;   IR IMAGING GUIDED PORT INSERTION  05/01/2024   JOINT REPLACEMENT     TOTAL SHOULDER ARTHROPLASTY Left 01/11/2018   TOTAL SHOULDER ARTHROPLASTY Left 01/11/2018   Procedure: LEFT TOTAL SHOULDER ARTHROPLASTY;  Surgeon: Sharl Selinda Dover, MD;  Location: [facility] OR;  Service: Orthopedics;  Laterality: Left;  2.5 hrs   TOTAL SHOULDER ARTHROPLASTY Right 02/08/2020   Procedure: TOTAL SHOULDER ARTHROPLASTY;  Surgeon: Sharl Selinda Dover,  MD;  Location: [facility] OR;  Service: Orthopedics;  Laterality: Right;  2.5 hrs RNFA     reports that she  has never smoked. She has never used smokeless tobacco. She reports that she does not currently use alcohol. She reports that she does not use drugs.  Allergies  Allergen Reactions   Propofol  Nausea Only    Family History  Problem Relation Age of Onset   Hypertension Mother    Heart disease Father    Heart attack Father    CAD Father    Colon cancer Neg Hx    Esophageal cancer Neg Hx    Rectal cancer Neg Hx    Stomach cancer Neg Hx     Prior to Admission medications   Medication Sig Start Date End Date Taking? Authorizing Provider  acyclovir (ZOVIRAX) 400 MG tablet Take 1 tablet (400 mg total) by mouth daily. 05/01/24   Babara Call, MD  allopurinol (ZYLOPRIM) 300 MG tablet Take 1 tablet (300 mg total) by mouth daily. 05/01/24   Babara Call, MD  fentaNYL  (DURAGESIC ) 25 MCG/HR Place 1 patch onto the skin every 3 (three) days. 05/12/24   Babara Call, MD  gabapentin  (NEURONTIN ) 100 MG capsule Take 1 capsule (100 mg total) by mouth 3 (three) times daily. 04/20/24   Babara Call, MD  lidocaine -prilocaine (EMLA) cream Apply to affected area once 05/01/24   Babara Call, MD  magic mouthwash (multi-ingredient) oral suspension Swish and swallow 5-10 mLs 4 (four) times daily as needed for mouth pain. 05/16/24   Babara Call, MD  methocarbamol  (ROBAXIN ) 500 MG tablet Take 500 mg by mouth every 8 (eight) hours as needed for muscle spasms. 10/15/17   [provider]  omeprazole  (PRILOSEC) 20 MG capsule TAKE 1 CAPSULE (20 MG TOTAL) BY MOUTH 2 (TWO) TIMES DAILY BEFORE A MEAL. 01/11/24   Legrand Victory LITTIE DOUGLAS, MD  ondansetron  (ZOFRAN ) 8 MG tablet Take 1 tablet (8 mg total) by mouth every 8 (eight) hours as needed for nausea or vomiting. Start on the third day after cyclophosphamide chemotherapy. 05/01/24   Babara Call, MD  oxybutynin (DITROPAN-XL) 10 MG 24 hr tablet Take 1 tablet (10 mg total) by mouth daily. 05/01/24   Francisca Redell BROCKS, MD  oxyCODONE  (OXY IR/ROXICODONE ) 5 MG immediate release tablet Take 1 tablet (5 mg  total) by mouth every 4 (four) hours as needed. 05/04/24   Babara Call, MD  predniSONE  (DELTASONE ) 20 MG tablet Take 5 tablets (100 mg total) by mouth daily. Take with food on days 2-5 of chemotherapy. 05/01/24   Yu, Zhou, MD  prochlorperazine (COMPAZINE) 10 MG tablet Take 1 tablet (10 mg total) by mouth every 6 (six) hours as needed for nausea or vomiting. 05/01/24   Babara Call, MD  sodium phosphate  (FLEET) ENEM Place 133 mLs (1 enema total) rectally daily as needed for severe constipation. 04/08/24   Waymond Lorelle Cummins, MD  tamsulosin (FLOMAX) 0.4 MG CAPS capsule Take 1 capsule (0.4 mg total) by mouth daily. 05/01/24   Francisca Redell BROCKS, MD    Physical Exam: Vitals:   05/17/24 1509 05/17/24 1510 05/17/24 1735 05/17/24 1922  BP:  115/86 122/76 130/82  Pulse:  (!) 125 95 96  Resp:  20  20  Temp:  100.1 F (37.8 C)    TempSrc:  Oral    SpO2:  100% 100% 100%  Weight: 72.6 kg     Height: 5' 4 (1.626 m)       GENERAL: 63 y.o.-year-old white female  patient, well-developed, well-nourished lying in the bed in no acute distress.  Pleasant and cooperative.   HEENT: Head atraumatic, normocephalic. Pupils equal. Mucus membranes moist. NECK: Supple. No JVD. CHEST: Normal breath sounds bilaterally. No wheezing, rales, rhonchi or crackles. No use of accessory muscles of respiration.  No reproducible chest wall tenderness. Port in right chest wall CARDIOVASCULAR: S1, S2 normal. No murmurs, rubs, or gallops. Cap refill <2 seconds. Pulses intact distally.  ABDOMEN: Soft, nondistended. EXTREMITIES: No pedal edema, cyanosis, or clubbing.  NEUROLOGIC: The patient is alert and oriented x 3. Cranial nerves II through XII are grossly intact with no focal sensorimotor deficit. PSYCHIATRIC:  Normal affect, mood, thought content. SKIN: Warm, dry, and intact without obvious rash, lesion, or ulcer.    Labs on Admission:  CBC: Recent Labs  Lab 05/12/24 1508 05/17/24 1613  WBC 5.1 0.1*  NEUTROABS 4.8 0.0*  HGB 12.4  12.5  HCT 35.8* 35.6*  MCV 89.5 88.1  PLT 189 58*   Basic Metabolic Panel: Recent Labs  Lab 05/12/24 1508 05/17/24 1613  NA 134* 130*  K 3.7 4.0  CL 101 95*  CO2 24 24  GLUCOSE 154* 154*  BUN 23 12  CREATININE 0.54 0.43*  CALCIUM 8.6* 9.0   GFR: Estimated Creatinine Clearance: 72.2 mL/min (A) (by C-G formula based on SCr of 0.43 mg/dL (L)). Liver Function Tests: Recent Labs  Lab 05/12/24 1508 05/17/24 1613  AST 20 16  ALT 12 10  ALKPHOS 55 64  BILITOT 0.7 1.3*  PROT 7.0 7.5  ALBUMIN 3.5 3.9   No results for input(s): LIPASE, AMYLASE in the last 168 hours. No results for input(s): AMMONIA in the last 168 hours. Coagulation Profile: Recent Labs  Lab 05/17/24 1613  INR 0.9   Cardiac Enzymes: No results for input(s): CKTOTAL, CKMB, CKMBINDEX, TROPONINI in the last 168 hours. BNP (last 3 results) No results for input(s): PROBNP in the last 8760 hours. HbA1C: No results for input(s): HGBA1C in the last 72 hours. CBG: No results for input(s): GLUCAP in the last 168 hours. Lipid Profile: No results for input(s): CHOL, HDL, LDLCALC, TRIG, CHOLHDL, LDLDIRECT in the last 72 hours. Thyroid Function Tests: No results for input(s): TSH, T4TOTAL, FREET4, T3FREE, THYROIDAB in the last 72 hours. Anemia Panel: No results for input(s): VITAMINB12, FOLATE, FERRITIN, TIBC, IRON, RETICCTPCT in the last 72 hours. Urine analysis:    Component Value Date/Time   COLORURINE YELLOW (A) 05/17/2024 1558   APPEARANCEUR HAZY (A) 05/17/2024 1558   APPEARANCEUR Clear 09/18/2012 1203   LABSPEC 1.016 05/17/2024 1558   LABSPEC 1.013 09/18/2012 1203   PHURINE 8.0 05/17/2024 1558   GLUCOSEU NEGATIVE 05/17/2024 1558   GLUCOSEU Negative 09/18/2012 1203   HGBUR LARGE (A) 05/17/2024 1558   BILIRUBINUR NEGATIVE 05/17/2024 1558   BILIRUBINUR Negative 09/18/2012 1203   KETONESUR 80 (A) 05/17/2024 1558   PROTEINUR 100 (A) 05/17/2024 1558    NITRITE NEGATIVE 05/17/2024 1558   LEUKOCYTESUR NEGATIVE 05/17/2024 1558   LEUKOCYTESUR 1+ 09/18/2012 1203   Sepsis Labs: @LABRCNTIP (procalcitonin:4,lacticidven:4) )No results found for this or any previous visit (from the past 240 hours).   Radiological Exams on Admission: CT ABDOMEN PELVIS W CONTRAST Result Date: 05/17/2024 CLINICAL DATA:  Abdominal pain.  Concern for kidney stone. EXAM: CT ABDOMEN AND PELVIS WITH CONTRAST TECHNIQUE: Multidetector CT imaging of the abdomen and pelvis was performed using the standard protocol following bolus administration of intravenous contrast. RADIATION DOSE REDUCTION: This exam was performed according to the departmental dose-optimization program which  includes automated exposure control, adjustment of the mA and/or kV according to patient size and/or use of iterative reconstruction technique. CONTRAST:  100mL OMNIPAQUE IOHEXOL 300 MG/ML  SOLN COMPARISON:  CT abdomen pelvis dated 04/08/2024. FINDINGS: Lower chest: The visualized lung bases are clear. Tiny nonocclusive embolus in the right lower lobe subsegmental branch (2/2). Chest CT may provide better evaluation. No intra-abdominal free air or free fluid. Hepatobiliary: The liver is unremarkable. No biliary dilatation. The gallbladder is unremarkable. Pancreas: Unremarkable. No pancreatic ductal dilatation or surrounding inflammatory changes. Spleen: Normal in size without focal abnormality. Adrenals/Urinary Tract: The adrenal glands unremarkable. Left ureteral stent with proximal tip in the inferior pole collecting system and distal end in the urinary bladder. There is mild left hydronephrosis. There is urothelial enhancement of the left renal collecting system and ureter suggestive of ascending UTI. No stone identified. The right kidney, right ureter, and urinary bladder appear unremarkable. Stomach/Bowel: Moderate stool throughout the colon. There is no bowel obstruction or active inflammation. Appendectomy.  Vascular/Lymphatic: The abdominal aorta and IVC unremarkable. No portal venous gas. There is no adenopathy. Reproductive: The uterus is grossly unremarkable. Other: Interval decrease in the masslike soft tissues thickening of the left pelvic sidewall measuring approximately 5.8 x 3.3 cm in greatest axial dimensions (previously 6.7 x 4.8 cm). A 1.4 x 1.2 cm rim enhancing low attenuating focus within this soft tissue mass (coronal 52/5) suspicious for an area of phlegmonous change or developing abscess. Musculoskeletal: Degenerative changes of the spine. No acute osseous pathology. IMPRESSION: 1. Left ureteral stent with mild left hydronephrosis and findings suggestive of ascending UTI. Correlation with urinalysis recommended. 2. Interval decrease in the masslike soft tissues thickening of the left pelvic sidewall. A 1.4 x 1.2 cm rim enhancing low attenuating focus within this soft tissue mass suspicious for an area of phlegmonous change or developing abscess. 3. Tiny nonocclusive embolus in the right lower lobe subsegmental branch. Chest CT may provide better evaluation. These results were called by telephone at the time of interpretation on 05/17/2024 at 6:28 pm to provider LAMAR PRICE , who verbally acknowledged these results. Electronically Signed   By: Vanetta Chou M.D.   On: 05/17/2024 18:39   DG Chest Port 1 View Result Date: 05/17/2024 CLINICAL DATA:  Sepsis. EXAM: PORTABLE CHEST 1 VIEW COMPARISON:  Chest radiograph dated 09/17/2012 FINDINGS: Right-sided PICC with tip over central SVC. No focal consolidation, pleural effusion, pneumothorax. The cardiac silhouette is within limits. No acute osseous pathology. Bilateral shoulder arthroplasties. IMPRESSION: No active disease. Electronically Signed   By: Vanetta Chou M.D.   On: 05/17/2024 16:38    EKG: Normal sinus rhythm at 93 bpm with normal axis and nonspecific ST-T wave changes.   Assessment/Plan  This is a 63 y.o. female with a history of  diffuse large B-cell lymphoma on chemotherapy, last treatment last week followed by Dr. Babara, left ureteral stent, arthritis, GERD  now being admitted with:  #. Sepsis secondary to urinary tract infection complicated by left-sided ureteral stent Meeting sepsis criteria with temperature to 100.1, heart rate at 125, WBCs are 0.1 K which is down from 5.1 K, platelets are at 58 down from 189 - Admit to inpatient with telemetry monitoring - IV antibiotics: Changed to Maxipime given stent - IV fluid hydration - Follow up blood,urine cultures - Repeat CBC in am.  -Pain control and Pyridium -Continue oxybutynin and tamsulosin - Infectious disease consultation has been requested -Given the concern for phlegmon or developing abscess in the left sidewall  of the pelvis will consult urology as well  #.  Febrile neutropenia and thrombocytopenia -Monitor CBC - Neutropenic precautions -Oncology has been consulted  #.  Small right sided PE likely secondary to malignancy -Continue heparin -Continuous pulse ox - Will check echo although she is not hypoxic -May consider CT angio in a.m.  Will defer for now considering that she just had contrast a few hours ago  #.  Chronic pain secondary to above as well as osteoarthritis - Continue gabapentin , methocarbamol , oxycodone  and fentanyl  patch.  #. History of gout - Continue allopurinol  #. History of GERD - Continue Protonix  Admission status: Inpatient, telemetry IV Fluids: LR Diet/Nutrition: Regular Consults called: Infectious disease, oncology DVT Px: Heparin SCDs and early ambulation. Code Status: Full Code  Disposition Plan: To home in 2-3 days  All the records are reviewed and case discussed with ED provider. Management plans discussed with the patient and/or family who express understanding and agree with plan of care.  Deatra Mcmahen D.O. on 05/17/2024 at 7:32 PM CC: Primary care physician; [name], PA-C   05/17/2024, 7:32 PM

## 2024-05-17 NOTE — Progress Notes (Signed)
 Multidisciplinary Oncology Council Documentation  Stacey [name] was presented by our [facility] on 05/17/2024, which included representatives from:  Palliative Care Dietitian  Physical/Occupational Therapist Nurse Navigator Genetics Social work Survivorship RN Financial Navigator Research RN   Decarla currently presents with history of lymphoma  We reviewed previous medical and familial history, history of present illness, and recent lab results along with all available histopathologic and imaging studies. The MOC considered available treatment options and made the following recommendations/referrals:  SW, nutrition  The MOC is a meeting of clinicians from various specialty areas who evaluate and discuss patients for whom a multidisciplinary approach is being considered. Final determinations in the plan of care are those of the provider(s).   Today's extended care, comprehensive team conference, Chantella was not present for the discussion and was not examined.

## 2024-05-17 NOTE — Telephone Encounter (Signed)
 Patient left message on CC triage line that she was on her way to ED.

## 2024-05-17 NOTE — Sepsis Progress Note (Signed)
 Elink monitoring for the code sepsis protocol.

## 2024-05-17 NOTE — ED Notes (Signed)
Pt taken to room 25 from triage.

## 2024-05-17 NOTE — ED Provider Notes (Signed)
 [facility] Provider Note    Event Date/Time   First MD Initiated Contact with Patient 05/17/24 1557     (approximate)   History   Dysuria and Back Pain   HPI  Stacey [name] is a 63 y.o. female with a history of B-cell lymphoma hydronephrosis of the left kidney with stent placement who presents with complaints of dysuria and bilateral low back pain which started last night, she had a fever this morning.  Reviewed medical records, patient last saw her oncologist on October 29, she is receiving chemotherapy   Physical Exam   Triage Vital Signs: ED Triage Vitals  Encounter Vitals Group     BP 05/17/24 1510 115/86     Girls Systolic BP Percentile --      Girls Diastolic BP Percentile --      Boys Systolic BP Percentile --      Boys Diastolic BP Percentile --      Pulse Rate 05/17/24 1510 (!) 125     Resp 05/17/24 1510 20     Temp 05/17/24 1510 100.1 F (37.8 C)     Temp Source 05/17/24 1510 Oral     SpO2 05/17/24 1510 100 %     Weight 05/17/24 1509 72.6 kg (160 lb)     Height 05/17/24 1509 1.626 m (5' 4)     Head Circumference --      Peak Flow --      Pain Score 05/17/24 1514 10     Pain Loc --      Pain Education --      Exclude from Growth Chart --     Most recent vital signs: Vitals:   05/17/24 1510 05/17/24 1735  BP: 115/86 122/76  Pulse: (!) 125 95  Resp: 20   Temp: 100.1 F (37.8 C)   SpO2: 100% 100%     General: Awake, uncomfortable CV:  Good peripheral perfusion.  Tachycardic Resp:  Normal effort.  Clear to auscultation Abd:  No distention.  Bilateral CVA tenderness Other:     ED Results / Procedures / Treatments   Labs (all labs ordered are listed, but only abnormal results are displayed) Labs Reviewed  COMPREHENSIVE METABOLIC PANEL WITH GFR - Abnormal; Notable for the following components:      Result Value   Sodium 130 (*)    Chloride 95 (*)    Glucose, Bld 154 (*)    Creatinine, Ser 0.43 (*)    Total  Bilirubin 1.3 (*)    All other components within normal limits  CBC WITH DIFFERENTIAL/PLATELET - Abnormal; Notable for the following components:   WBC 0.1 (*)    HCT 35.6 (*)    Platelets 58 (*)    Neutro Abs 0.0 (*)    Lymphs Abs 0.1 (*)    Monocytes Absolute 0.0 (*)    All other components within normal limits  URINALYSIS, W/ REFLEX TO CULTURE (INFECTION SUSPECTED) - Abnormal; Notable for the following components:   Color, Urine YELLOW (*)    APPearance HAZY (*)    Hgb urine dipstick LARGE (*)    Ketones, ur 80 (*)    Protein, ur 100 (*)    All other components within normal limits  CULTURE, BLOOD (ROUTINE X 2)  CULTURE, BLOOD (ROUTINE X 2)  URINE CULTURE  LACTIC ACID, PLASMA  PROTIME-INR  LACTIC ACID, PLASMA  TROPONIN I (HIGH SENSITIVITY)     EKG     RADIOLOGY Chest x-ray without acute  abnormality    PROCEDURES:  Critical Care performed: yes  CRITICAL CARE Performed by: Lamar Price   Total critical care time: 30 minutes  Critical care time was exclusive of separately billable procedures and treating other patients.  Critical care was necessary to treat or prevent imminent or life-threatening deterioration.  Critical care was time spent personally by me on the following activities: development of treatment plan with patient and/or surrogate as well as nursing, discussions with consultants, evaluation of patient's response to treatment, examination of patient, obtaining history from patient or surrogate, ordering and performing treatments and interventions, ordering and review of laboratory studies, ordering and review of radiographic studies, pulse oximetry and re-evaluation of patient's condition.   Procedures   MEDICATIONS ORDERED IN ED: Medications  morphine  (PF) 4 MG/ML injection 4 mg (has no administration in time range)  morphine  (PF) 4 MG/ML injection 4 mg (4 mg Intravenous Given 05/17/24 1620)  ondansetron  (ZOFRAN ) injection 4 mg (4 mg  Intravenous Given 05/17/24 1620)  cefTRIAXone (ROCEPHIN) 2 g in sodium chloride  0.9 % 100 mL IVPB (0 g Intravenous Stopped 05/17/24 1737)  iohexol (OMNIPAQUE) 300 MG/ML solution 100 mL (100 mLs Intravenous Contrast Given 05/17/24 1806)     IMPRESSION / MDM / ASSESSMENT AND PLAN / ED COURSE  I reviewed the triage vital signs and the nursing notes. Patient's presentation is most consistent with acute presentation with potential threat to life or bodily function.  Patient with a history of lymphoma on chemotherapy who presents with fever, tachycardia, dysuria, back pain, highly suspicious for urinary tract infection, pyelonephritis, she does have a history of a ureteral stent as well for hydronephrosis.  Will obtain labs lactic blood culture urinalysis start IV fluids, IV morphine , IV Zofran , IV Rocephin  Notified of critical labs patient's white blood cell count is 0.1, 9 neutrophils on differential  Notified Dr. Rennie of oncology that the patient will be admitted for urinary tract infection with neutropenia  Hospitalist consulted, pending CT abdomen pelvis  ----------------------------------------- 6:41 PM on 05/17/2024 ----------------------------------------- Notified by radiologist of small right lower lobe PE, discussed with hospitalist Dr. Florie      FINAL CLINICAL IMPRESSION(S) / ED DIAGNOSES   Final diagnoses:  Neutropenic fever  Urinary tract infection without hematuria, site unspecified  Pulmonary embolism, other, unspecified chronicity, unspecified whether acute cor pulmonale present (HCC)     Rx / DC Orders   ED Discharge Orders     None        Note:  This document was prepared using Dragon voice recognition software and may include unintentional dictation errors.   Price Lamar, MD 05/17/24 [phone]

## 2024-05-17 NOTE — ED Notes (Addendum)
 Notified MD of critical lab values Neutro Abs 0.0 and WBC 0.1

## 2024-05-18 ENCOUNTER — Inpatient Hospital Stay: Payer: Self-pay | Admitting: Oncology

## 2024-05-18 ENCOUNTER — Inpatient Hospital Stay: Payer: Self-pay

## 2024-05-18 ENCOUNTER — Inpatient Hospital Stay

## 2024-05-18 ENCOUNTER — Encounter: Payer: Self-pay | Admitting: Oncology

## 2024-05-18 DIAGNOSIS — D649 Anemia, unspecified: Secondary | ICD-10-CM

## 2024-05-18 DIAGNOSIS — G893 Neoplasm related pain (acute) (chronic): Secondary | ICD-10-CM | POA: Diagnosis not present

## 2024-05-18 DIAGNOSIS — R600 Localized edema: Secondary | ICD-10-CM

## 2024-05-18 DIAGNOSIS — N390 Urinary tract infection, site not specified: Secondary | ICD-10-CM

## 2024-05-18 DIAGNOSIS — C8330 Diffuse large B-cell lymphoma, unspecified site: Secondary | ICD-10-CM | POA: Diagnosis not present

## 2024-05-18 DIAGNOSIS — A419 Sepsis, unspecified organism: Secondary | ICD-10-CM

## 2024-05-18 DIAGNOSIS — I2699 Other pulmonary embolism without acute cor pulmonale: Secondary | ICD-10-CM

## 2024-05-18 DIAGNOSIS — D709 Neutropenia, unspecified: Secondary | ICD-10-CM

## 2024-05-18 DIAGNOSIS — N1330 Unspecified hydronephrosis: Secondary | ICD-10-CM

## 2024-05-18 DIAGNOSIS — R1900 Intra-abdominal and pelvic swelling, mass and lump, unspecified site: Secondary | ICD-10-CM

## 2024-05-18 DIAGNOSIS — D6959 Other secondary thrombocytopenia: Secondary | ICD-10-CM

## 2024-05-18 LAB — COMPREHENSIVE METABOLIC PANEL WITH GFR
ALT: 7 U/L (ref 0–44)
AST: 12 U/L — ABNORMAL LOW (ref 15–41)
Albumin: 3 g/dL — ABNORMAL LOW (ref 3.5–5.0)
Alkaline Phosphatase: 49 U/L (ref 38–126)
Anion gap: 6 (ref 5–15)
BUN: 10 mg/dL (ref 8–23)
CO2: 27 mmol/L (ref 22–32)
Calcium: 8.4 mg/dL — ABNORMAL LOW (ref 8.9–10.3)
Chloride: 101 mmol/L (ref 98–111)
Creatinine, Ser: 0.44 mg/dL (ref 0.44–1.00)
GFR, Estimated: >60 mL/min (ref >60)
Glucose, Bld: 129 mg/dL — ABNORMAL HIGH (ref 70–99)
Potassium: 3.6 mmol/L (ref 3.5–5.1)
Sodium: 134 mmol/L — ABNORMAL LOW (ref 135–145)
Total Bilirubin: 0.9 mg/dL (ref 0.0–1.2)
Total Protein: 5.9 g/dL — ABNORMAL LOW (ref 6.5–8.1)

## 2024-05-18 LAB — CBC
HCT: 31.1 % — ABNORMAL LOW (ref 36.0–46.0)
Hemoglobin: 10.6 g/dL — ABNORMAL LOW (ref 12.0–15.0)
MCH: 30.9 pg (ref 26.0–34.0)
MCHC: 34.1 g/dL (ref 30.0–36.0)
MCV: 90.7 fL (ref 80.0–100.0)
Platelets: 43 K/uL — ABNORMAL LOW (ref 150–400)
RBC: 3.43 MIL/uL — ABNORMAL LOW (ref 3.87–5.11)
RDW: 12.4 % (ref 11.5–15.5)
WBC: 0.3 K/uL — CL (ref 4.0–10.5)
nRBC: 0 % (ref 0.0–0.2)

## 2024-05-18 LAB — HEPARIN LEVEL (UNFRACTIONATED)
Heparin Unfractionated: 0.44 [IU]/mL (ref 0.30–0.70)
Heparin Unfractionated: 0.26 [IU]/mL — ABNORMAL LOW (ref 0.30–0.70)
Heparin Unfractionated: 0.27 [IU]/mL — ABNORMAL LOW (ref 0.30–0.70)

## 2024-05-18 MED ORDER — CHLORHEXIDINE GLUCONATE 0.12 % MT SOLN
15.0000 mL | Freq: Two times a day (BID) | OROMUCOSAL | Status: DC
  Administered 2024-05-18 – 2024-05-24 (×12): 15 mL via OROMUCOSAL
  Filled 2024-05-18 (×12): qty 15

## 2024-05-18 MED ORDER — CHLORHEXIDINE GLUCONATE CLOTH 2 % EX PADS: 6.0000 | MEDICATED_PAD | Freq: Every day | CUTANEOUS | Status: DC

## 2024-05-18 MED ORDER — LORATADINE 10 MG PO TABS
10.0000 mg | ORAL_TABLET | Freq: Every day | ORAL | Status: DC
  Administered 2024-05-18 – 2024-05-24 (×7): 10 mg via ORAL
  Filled 2024-05-18 (×7): qty 1

## 2024-05-18 MED ORDER — OXYCODONE HCL 5 MG PO TABS
5.0000 mg | ORAL_TABLET | ORAL | Status: DC | PRN
  Administered 2024-05-18 – 2024-05-24 (×23): 5 mg via ORAL
  Filled 2024-05-18 (×24): qty 1

## 2024-05-18 MED ORDER — OXYBUTYNIN CHLORIDE ER 10 MG PO TB24
10.0000 mg | ORAL_TABLET | Freq: Every day | ORAL | Status: DC
  Administered 2024-05-18 – 2024-05-24 (×7): 10 mg via ORAL
  Filled 2024-05-18 (×8): qty 1

## 2024-05-18 MED ORDER — ALLOPURINOL 100 MG PO TABS
300.0000 mg | ORAL_TABLET | Freq: Every day | ORAL | Status: DC
  Administered 2024-05-18 – 2024-05-24 (×7): 300 mg via ORAL
  Filled 2024-05-18 (×7): qty 3

## 2024-05-18 MED ORDER — METHOCARBAMOL 500 MG PO TABS
500.0000 mg | ORAL_TABLET | Freq: Three times a day (TID) | ORAL | Status: DC | PRN
Start: 2024-05-18 — End: 2024-05-24
  Administered 2024-05-18 – 2024-05-20 (×3): 500 mg via ORAL
  Filled 2024-05-18 (×4): qty 1

## 2024-05-18 MED ORDER — MORPHINE SULFATE (PF) 2 MG/ML IV SOLN: 2.0000 mg | Freq: Four times a day (QID) | INTRAVENOUS | Status: DC | PRN

## 2024-05-18 MED ORDER — DICLOFENAC SODIUM 1 % EX GEL
2.0000 g | Freq: Four times a day (QID) | CUTANEOUS | Status: DC
  Administered 2024-05-18 (×3): 2 g via TOPICAL
  Filled 2024-05-18: qty 100

## 2024-05-18 MED ORDER — HEPARIN BOLUS VIA INFUSION
1000.0000 [IU] | Freq: Once | INTRAVENOUS | Status: DC
  Filled 2024-05-18: qty 1000

## 2024-05-18 MED ORDER — GABAPENTIN 300 MG PO CAPS
300.0000 mg | ORAL_CAPSULE | Freq: Two times a day (BID) | ORAL | Status: DC
  Administered 2024-05-18 – 2024-05-19 (×2): 300 mg via ORAL
  Filled 2024-05-18 (×2): qty 1

## 2024-05-18 MED ORDER — FENTANYL 25 MCG/HR TD PT72
1.0000 | MEDICATED_PATCH | TRANSDERMAL | Status: DC
  Administered 2024-05-18 – 2024-05-24 (×3): 1 via TRANSDERMAL
  Filled 2024-05-18 (×3): qty 1

## 2024-05-18 MED ORDER — SODIUM CHLORIDE 0.9 % IV SOLN
1.0000 g | Freq: Three times a day (TID) | INTRAVENOUS | Status: DC
  Administered 2024-05-18 – 2024-05-19 (×3): 1 g via INTRAVENOUS
  Filled 2024-05-18 (×3): qty 20

## 2024-05-18 MED ORDER — PANTOPRAZOLE SODIUM 40 MG PO TBEC
40.0000 mg | DELAYED_RELEASE_TABLET | Freq: Every day | ORAL | Status: DC
  Administered 2024-05-18 – 2024-05-24 (×7): 40 mg via ORAL
  Filled 2024-05-18 (×7): qty 1

## 2024-05-18 MED ORDER — GABAPENTIN 100 MG PO CAPS
100.0000 mg | ORAL_CAPSULE | Freq: Three times a day (TID) | ORAL | Status: DC
  Administered 2024-05-18: 100 mg via ORAL
  Filled 2024-05-18: qty 1

## 2024-05-18 MED ORDER — TAMSULOSIN HCL 0.4 MG PO CAPS
0.4000 mg | ORAL_CAPSULE | Freq: Every day | ORAL | Status: DC
  Administered 2024-05-18 – 2024-05-24 (×7): 0.4 mg via ORAL
  Filled 2024-05-18 (×7): qty 1

## 2024-05-18 MED ORDER — ACYCLOVIR 200 MG PO CAPS
400.0000 mg | ORAL_CAPSULE | Freq: Every day | ORAL | Status: DC
  Administered 2024-05-18 – 2024-05-24 (×7): 400 mg via ORAL
  Filled 2024-05-18 (×7): qty 2

## 2024-05-18 MED ORDER — MORPHINE SULFATE (PF) 2 MG/ML IV SOLN
2.0000 mg | INTRAVENOUS | Status: DC | PRN
  Administered 2024-05-18 – 2024-05-24 (×25): 2 mg via INTRAVENOUS
  Filled 2024-05-18 (×25): qty 1

## 2024-05-18 MED ORDER — IOHEXOL 350 MG/ML SOLN
50.0000 mL | Freq: Once | INTRAVENOUS | Status: AC | PRN
  Administered 2024-05-18: 50 mL via INTRAVENOUS

## 2024-05-18 NOTE — Progress Notes (Signed)
 [facility]  - [place] at [facility]   PATIENT NAME: Stacey [name]    MR#:  [id number]  DATE OF BIRTH:  06-Jun-1961  SUBJECTIVE:  husband at bedside. Patient came in with fever nausea not feeling well. Has significant left back pain. Getting IV heparin drip for small PE found on abdominal soft CAT scan. Patient continues to burning while she pees. No vomiting.    VITALS:  Blood pressure (!) 148/66, pulse 82, temperature 98.2 F (36.8 C), temperature source Axillary, resp. rate 18, height 5' 4 (1.626 m), weight 72.6 kg, SpO2 98%.  PHYSICAL EXAMINATION:   GENERAL:  63 y.o.-year-old patient with no acute distress. Deconditioned, weak LUNGS: Normal breath sounds bilaterally, no wheezing port+ CARDIOVASCULAR: S1, S2 normal. No murmur   ABDOMEN: Soft, nontender, nondistended. Left flank pain EXTREMITIES: No  edema b/l.    NEUROLOGIC: nonfocal  patient is alert and awake   LABORATORY PANEL:  CBC Recent Labs  Lab 05/18/24 0425  WBC 0.3*  HGB 10.6*  HCT 31.1*  PLT 43*    Chemistries  Recent Labs  Lab 05/17/24 2136 05/18/24 0425  NA  --  134*  K  --  3.6  CL  --  101  CO2  --  27  GLUCOSE  --  129*  BUN  --  10  CREATININE  --  0.44  CALCIUM  --  8.4*  MG 2.0  --   AST  --  12*  ALT  --  7  ALKPHOS  --  49  BILITOT  --  0.9   Cardiac Enzymes No results for input(s): TROPONINI in the last 168 hours. RADIOLOGY:  CT ABDOMEN PELVIS W CONTRAST Result Date: 05/17/2024 CLINICAL DATA:  Abdominal pain.  Concern for kidney stone. EXAM: CT ABDOMEN AND PELVIS WITH CONTRAST TECHNIQUE: Multidetector CT imaging of the abdomen and pelvis was performed using the standard protocol following bolus administration of intravenous contrast. RADIATION DOSE REDUCTION: This exam was performed according to the departmental dose-optimization program which includes automated exposure control, adjustment of the mA and/or kV according to patient size and/or use of iterative  reconstruction technique. CONTRAST:  100mL OMNIPAQUE IOHEXOL 300 MG/ML  SOLN COMPARISON:  CT abdomen pelvis dated 04/08/2024. FINDINGS: Lower chest: The visualized lung bases are clear. Tiny nonocclusive embolus in the right lower lobe subsegmental branch (2/2). Chest CT may provide better evaluation. No intra-abdominal free air or free fluid. Hepatobiliary: The liver is unremarkable. No biliary dilatation. The gallbladder is unremarkable. Pancreas: Unremarkable. No pancreatic ductal dilatation or surrounding inflammatory changes. Spleen: Normal in size without focal abnormality. Adrenals/Urinary Tract: The adrenal glands unremarkable. Left ureteral stent with proximal tip in the inferior pole collecting system and distal end in the urinary bladder. There is mild left hydronephrosis. There is urothelial enhancement of the left renal collecting system and ureter suggestive of ascending UTI. No stone identified. The right kidney, right ureter, and urinary bladder appear unremarkable. Stomach/Bowel: Moderate stool throughout the colon. There is no bowel obstruction or active inflammation. Appendectomy. Vascular/Lymphatic: The abdominal aorta and IVC unremarkable. No portal venous gas. There is no adenopathy. Reproductive: The uterus is grossly unremarkable. Other: Interval decrease in the masslike soft tissues thickening of the left pelvic sidewall measuring approximately 5.8 x 3.3 cm in greatest axial dimensions (previously 6.7 x 4.8 cm). A 1.4 x 1.2 cm rim enhancing low attenuating focus within this soft tissue mass (coronal 52/5) suspicious for an area of phlegmonous change or developing abscess. Musculoskeletal: Degenerative changes  of the spine. No acute osseous pathology. IMPRESSION: 1. Left ureteral stent with mild left hydronephrosis and findings suggestive of ascending UTI. Correlation with urinalysis recommended. 2. Interval decrease in the masslike soft tissues thickening of the left pelvic sidewall. A 1.4 x  1.2 cm rim enhancing low attenuating focus within this soft tissue mass suspicious for an area of phlegmonous change or developing abscess. 3. Tiny nonocclusive embolus in the right lower lobe subsegmental branch. Chest CT may provide better evaluation. These results were called by telephone at the time of interpretation on 05/17/2024 at 6:28 pm to provider LAMAR PRICE , who verbally acknowledged these results. Electronically Signed   By: Vanetta Chou M.D.   On: 05/17/2024 18:39   DG Chest Port 1 View Result Date: 05/17/2024 CLINICAL DATA:  Sepsis. EXAM: PORTABLE CHEST 1 VIEW COMPARISON:  Chest radiograph dated 09/17/2012 FINDINGS: Right-sided PICC with tip over central SVC. No focal consolidation, pleural effusion, pneumothorax. The cardiac silhouette is within limits. No acute osseous pathology. Bilateral shoulder arthroplasties. IMPRESSION: No active disease. Electronically Signed   By: Vanetta Chou M.D.   On: 05/17/2024 16:38    Assessment and Plan From H and P Stacey [name] is a 63 y.o. female with a known history of diffuse large B-cell lymphoma on chemotherapy, last treatment last week followed by Dr. Babara, left ureteral stent arthritis, GERD presents to the emergency department for evaluation of fever and dysuria.  Patient was in a usual state of health until last night when she reports the onset of dysuria as well as suprapubic and bilateral low back pain.  She developed fever and came to the ER.  September 2025 CT abdomen showed an obstructing mass along the left pelvic sidewall causing hydroureter and hydronephrosis. At that time stent placed by urologist   CT abd/pelvis on 05/17/2024:Left ureteral stent with mild left hydronephrosis and findings suggestive of ascending UTI. Correlation with urinalysis recommended. 2. Interval decrease in the masslike soft tissues thickening of the left pelvic sidewall. A 1.4 x 1.2 cm rim enhancing low attenuating focus within this soft tissue mass  suspicious for an area of phlegmonous change or developing abscess. 3. Tiny nonocclusive embolus in the right lower lobe subsegmental branch. Chest CT may provide better evaluation.    Sepsis secondary to urinary tract infection complicated by left-sided ureteral stent Possible left pelvic mass tumor necrosis H/o Diffuse B cell lymphoma --Meeting sepsis criteria with temperature to 100.1, heart rate at 125, WBCs are 0.1 K which is down from 5.1 K, platelets are at 58 down from 189 - IV antibiotics: Changed to Maxipime given stent - IV fluid hydration - Follow up blood culture--neg ,urine cultures neg -Pain control and Pyridium -Continue oxybutynin and tamsulosin - Infectious disease consultation has been requested -Given the concern for phlegmon or developing abscess in the left sidewall of the pelvis will consult urology as well --[facility] Dr Santos is in place    Febrile neutropenia and thrombocytopenia -Monitor CBC - Neutropenic precautions -Oncology has been consulted--Dr Babara --received GCSF on 05/12/24 per onc notes   Small right sided PE likely secondary to malignancy -Continue heparin -  check echo although she is not hypoxic - CTA chest ordered    Chronic pain secondary to above as well as osteoarthritis - Continue gabapentin , methocarbamol , oxycodone  and fentanyl  patch.   History of gout - Continue allopurinol    History of GERD - Continue Protonix    DVT Px: Heparin SCDs and early ambulation. Family communication :husband  at bedside Consults : ID, urology, infectious disease CODE STATUS: full DVT Prophylaxis :heparin Level of care: Telemetry Status is: Inpatient Remains inpatient appropriate because: sepsis    TOTAL TIME TAKING CARE OF THIS PATIENT: 40 minutes.  >50% time spent on counselling and coordination of care  Note: This dictation was prepared with Dragon dictation along with smaller phrase technology. Any transcriptional errors that  result from this process are unintentional.  [name] M.D    [facility]   CC: Primary care physician; [name], PA-C

## 2024-05-18 NOTE — Evaluation (Signed)
 Occupational Therapy Evaluation Patient Details Name: Stacey [name] MRN: [id number] DOB: July 08, 1961 Today's Date: 05/18/2024   History of Present Illness   63 y.o. female with a known history of diffuse large B-cell lymphoma on chemotherapy, last treatment last week followed by Dr. Babara, left ureteral stent, GERD presents to the emergency department for evaluation of fever and dysuria.  Patient was in a usual state of health until night prior with dysuria as well as suprapubic, L pelvis and bilateral low back pain.  Of note in September 2025 CT angiogram showed an  obstructing mass along the left pelvic sidewall causing hydroureter and hydronephrosis.     Clinical Impressions Patient presenting with decreased Ind in self care, balance, functional mobility, transfers, endurance, and safety awareness.  Patient reports living at home with husband. She uses RW for mobility and husband has been assisting with self care as needed secondary to pain limiting her at home. Pt has been taking basin baths at baseline and ambulating short distances. Patient currently functioning at min A for bed mobility and agreeable to sit on EOB. Pt declines OOB activities secondary to 10/10 pain this session. Patient will benefit from acute OT to increase overall independence in the areas of ADLs, functional mobility, and safety awareness in order to safely discharge.     If plan is discharge home, recommend the following:   A little help with walking and/or transfers;A little help with bathing/dressing/bathroom;Assistance with cooking/housework;Assist for transportation;Help with stairs or ramp for entrance     Functional Status Assessment   Patient has had a recent decline in their functional status and demonstrates the ability to make significant improvements in function in a reasonable and predictable amount of time.     Equipment Recommendations   BSC/3in1      Precautions/Restrictions    Precautions Precautions: Fall     Mobility Bed Mobility Overal bed mobility: Needs Assistance Bed Mobility: Supine to Sit, Sit to Supine     Supine to sit: Min assist Sit to supine: Min assist        Transfers                   General transfer comment: Pt declined OOB activitiy secondary to pain          ADL either performed or assessed with clinical judgement   ADL Overall ADL's : Needs assistance/impaired                                       General ADL Comments: set up A for UB self care and grooming tasks. Min - mod A for LB self care     Vision Patient Visual Report: No change from baseline              Pertinent Vitals/Pain Pain Assessment Pain Assessment: 0-10 Pain Location: lower abdomen, L>R Pain Descriptors / Indicators: Aching, Discomfort Pain Intervention(s): Monitored during session, Repositioned     Extremity/Trunk Assessment Upper Extremity Assessment Upper Extremity Assessment: Generalized weakness   Lower Extremity Assessment Lower Extremity Assessment: Generalized weakness LLE Deficits / Details: Pt had AROM in L LE but struggled generally with against gravity movement (self assisted with UEs) and hesitant 2/2 pain       Communication Communication Communication: No apparent difficulties   Cognition Arousal: Alert Behavior During Therapy: Anxious, WFL for tasks assessed/performed  Following commands: Intact       Cueing  General Comments   Cueing Techniques: Verbal cues              Home Living Family/patient expects to be discharged to:: Private residence Living Arrangements: Spouse/significant other Available Help at Discharge: Available 24 hours/day Type of Home: Mobile home Home Access: Stairs to enter Entrance Stairs-Number of Steps: 4 Entrance Stairs-Rails: Can reach both Home Layout: One level     Bathroom Shower/Tub: Multimedia Programmer: Standard     Home Equipment: Agricultural Consultant (2 wheels);Cane - single point          Prior Functioning/Environment Prior Level of Function : Needs assist             Mobility Comments: Pt reports short distance ambulate with RW and was using SPC up until 1 month ago ADLs Comments: husband helps with all home management assists with self care tasks as needed    OT Problem List: Decreased strength;Decreased activity tolerance;Impaired balance (sitting and/or standing);Decreased safety awareness   OT Treatment/Interventions: Self-care/ADL training;Therapeutic activities;Therapeutic exercise;Energy conservation;Patient/family education;DME and/or AE instruction;Balance training      OT Goals(Current goals can be found in the care plan section)   Acute Rehab OT Goals Patient Stated Goal: to go home and decrease pain OT Goal Formulation: With patient/family Time For Goal Achievement: 06/01/24 Potential to Achieve Goals: Good ADL Goals Pt Will Perform Grooming: with supervision;standing Pt Will Perform Lower Body Dressing: with supervision;sit to/from stand Pt Will Transfer to Toilet: with supervision;ambulating Pt Will Perform Toileting - Clothing Manipulation and hygiene: with supervision;sit to/from stand   OT Frequency:  Min 2X/week       AM-PAC OT 6 Clicks Daily Activity     Outcome Measure Help from another person eating meals?: None Help from another person taking care of personal grooming?: None Help from another person toileting, which includes using toliet, bedpan, or urinal?: A Little Help from another person bathing (including washing, rinsing, drying)?: A Little Help from another person to put on and taking off regular upper body clothing?: None Help from another person to put on and taking off regular lower body clothing?: A Little 6 Click Score: 21   End of Session Nurse Communication: Mobility status  Activity Tolerance:  Patient limited by pain Patient left: in bed;with call bell/phone within reach;with bed alarm set  OT Visit Diagnosis: Unsteadiness on feet (R26.81);Repeated falls (R29.6);Muscle weakness (generalized) (M62.81)                Time: 9084-9064 OT Time Calculation (min): 20 min Charges:  OT General Charges $OT Visit: 1 Visit OT Evaluation $OT Eval Low Complexity: 1 Low OT Treatments $Therapeutic Activity: 8-22 mins  Izetta Claude, MS, OTR/L , CBIS ascom [phone]  05/18/24, 3:52 PM

## 2024-05-18 NOTE — Consult Note (Addendum)
 NAME: Stacey [name]  DOB: 1961-04-07  MRN: [id number]  Date/Time: 05/18/2024 1:26 PM  REQUESTING PROVIDER: Dr Florie Subjective:  REASON FOR CONSULT: febrile neutropenia  ?   Stacey [name] is a 63 y.o. with a history of b/l shoulder replacement,diffuse large B cell lymphoma Presents to the ED with fever and dysuria of 1 day duration  On 9/27 admitted to [facility] with left flank pain and found to have hydronephrosis left and left pelvic mass and underwent left ureteric stent placement for hydronephrosis  9/29 pelvic mass biopsy by IR- pathology Diffuse large B cell lymphoma 04/24/24 PET scan - except left pelvic mass and mild enhancement in spleen no other area 05/01/24 BM biosy no lymphoma 05/01/24 PORT placement 05/10/24 first chemo RCHOP with D3 GCSF decadron , adriamycin, vincristin cyclophosphamide, rituxan-  Vitals in the ED   05/17/24 15:10  BP 115/86  Temp 100.1 F (37.8 C)  Pulse Rate 125 !  Resp 20  SpO2 100 %    Latest Reference Range & Units 05/17/24 16:13  WBC 4.0 - 10.5 K/uL 0.1 (LL) [1]  Hemoglobin 12.0 - 15.0 g/dL 87.4  HCT 63.9 - 53.9 % 35.6 (L)  Platelets 150 - 400 K/uL 58 (L) [2]  Creatinine 0.44 - 1.00 mg/dL 9.56 (L)    Blood culture sent CT abdomen and pelvis showed Left ureteral stent with mild left hydronephrosis and findings suggestive of ascending UTI. Correlation with urinalysis recommended. 2. Interval decrease in the masslike soft tissues thickening of the left pelvic sidewall. A 1.4 x 1.2 cm rim enhancing low attenuating focus within this soft tissue mass suspicious for an area of phlegmonous change or developing abscess.  UA 21-50 WBC Started on cefepime I am asked to see her for the same  Pt c/o severe dysuria and lower abdominal apin Also has some left sided flank pain No recent antibiotic use No animal bites No cat scratches  Past Medical History:  Diagnosis Date   Arthritis    left shoulder, neck, lower back   (01/11/2018)   Chronic lower back pain    Family history of adverse reaction to anesthesia    Mother has nausea   GERD (gastroesophageal reflux disease)    PONV (postoperative nausea and vomiting)    Pre-diabetes    UTI (urinary tract infection) 03/20/2021    Past Surgical History:  Procedure Laterality Date   APPENDECTOMY     CYSTOSCOPY W/ URETERAL STENT PLACEMENT Left 04/08/2024   Procedure: CYSTOSCOPY, WITH RETROGRADE PYELOGRAM AND URETERAL STENT INSERTION;  Surgeon: Watt Rush, MD;  Location: [facility] ORS;  Service: Urology;  Laterality: Left;   IR IMAGING GUIDED PORT INSERTION  05/01/2024   JOINT REPLACEMENT     TOTAL SHOULDER ARTHROPLASTY Left 01/11/2018   TOTAL SHOULDER ARTHROPLASTY Left 01/11/2018   Procedure: LEFT TOTAL SHOULDER ARTHROPLASTY;  Surgeon: Sharl Selinda Dover, MD;  Location: [facility] OR;  Service: Orthopedics;  Laterality: Left;  2.5 hrs   TOTAL SHOULDER ARTHROPLASTY Right 02/08/2020   Procedure: TOTAL SHOULDER ARTHROPLASTY;  Surgeon: Sharl Selinda Dover, MD;  Location: [facility] OR;  Service: Orthopedics;  Laterality: Right;  2.5 hrs RNFA    Social History   Socioeconomic History   Marital status: Married    Spouse name: Rosalinda Seaman   Number of children: Not on file   Years of education: Not on file   Highest education level: Not on file  Occupational History   Not on file  Tobacco Use   Smoking status: Never   Smokeless tobacco:  Never  Vaping Use   Vaping status: Never Used  Substance and Sexual Activity   Alcohol use: Not Currently    Comment: 01/11/2018 might have 1 drink/month   Drug use: Never   Sexual activity: Yes  Other Topics Concern   Not on file  Social History Narrative   Not on file   Social Drivers of Health   Financial Resource Strain: Low Risk  (03/30/2024)   Received [facility]   Overall Financial Resource Strain (CARDIA)    Difficulty of Paying Living Expenses: Not hard at all  Food Insecurity: No Food  Insecurity (05/17/2024)   Hunger Vital Sign    Worried About Running Out of Food in the Last Year: Never true    Ran Out of Food in the Last Year: Never true  Transportation Needs: No Transportation Needs (05/17/2024)   PRAPARE - Administrator, Civil Service (Medical): No    Lack of Transportation (Non-Medical): No  Physical Activity: Not on file  Stress: Not on file  Social Connections: Socially Integrated (05/17/2024)   Social Connection and Isolation Panel    Frequency of Communication with Friends and Family: Three times a week    Frequency of Social Gatherings with Friends and Family: Three times a week    Attends Religious Services: 1 to 4 times per year    Active Member of Clubs or Organizations: Yes    Attends Banker Meetings: 1 to 4 times per year    Marital Status: Married  Catering Manager Violence: Not At Risk (05/17/2024)   Humiliation, Afraid, Rape, and Kick questionnaire    Fear of Current or Ex-Partner: No    Emotionally Abused: No    Physically Abused: No    Sexually Abused: No    Family History  Problem Relation Age of Onset   Hypertension Mother    Heart disease Father    Heart attack Father    CAD Father    Colon cancer Neg Hx    Esophageal cancer Neg Hx    Rectal cancer Neg Hx    Stomach cancer Neg Hx    Allergies  Allergen Reactions   Propofol  Nausea Only    Patient states medication can be tolerated with an antiemetic before taking   I? Current Facility-Administered Medications  Medication Dose Route Frequency Provider Last Rate Last Admin   acetaminophen  (TYLENOL ) tablet 650 mg  650 mg Oral Q6H PRN Hugelmeyer, Alexis, DO   650 mg at 05/18/24 9166   Or   acetaminophen  (TYLENOL ) suppository 650 mg  650 mg Rectal Q6H PRN Hugelmeyer, Alexis, DO       acyclovir (ZOVIRAX) 200 MG capsule 400 mg  400 mg Oral Daily Hugelmeyer, Alexis, DO   400 mg at 05/18/24 0833   allopurinol (ZYLOPRIM) tablet 300 mg  300 mg Oral Daily Hugelmeyer,  Alexis, DO   300 mg at 05/18/24 9167   bisacodyl (DULCOLAX) EC tablet 5 mg  5 mg Oral Daily PRN Hugelmeyer, Alexis, DO       ceFEPIme (MAXIPIME) 2 g in sodium chloride  0.9 % 100 mL IVPB  2 g Intravenous Q8H Lenon Elsie HERO, RPH 200 mL/hr at 05/18/24 0558 2 g at 05/18/24 0558   Chlorhexidine  Gluconate Cloth 2 % PADS 6 each  6 each Topical Daily Hugelmeyer, Alexis, DO       diclofenac Sodium (VOLTAREN) 1 % topical gel 2 g  2 g Topical QID Patel, Sona, MD   2  g at 05/18/24 1222   feeding supplement (ENSURE PLUS HIGH PROTEIN) liquid 237 mL  237 mL Oral BID BM Hugelmeyer, Alexis, DO   237 mL at 05/18/24 0824   fentaNYL  (DURAGESIC ) 25 MCG/HR 1 patch  1 patch Transdermal Q72H Hugelmeyer, Alexis, DO   1 patch at 05/18/24 0423   gabapentin  (NEURONTIN ) capsule 100 mg  100 mg Oral TID Hugelmeyer, Alexis, DO   100 mg at 05/18/24 0832   heparin ADULT infusion 100 units/mL (25000 units/250mL)  1,250 Units/hr Intravenous Continuous Hallaji, Sheema M, RPH 12.5 mL/hr at 05/18/24 1221 1,250 Units/hr at 05/18/24 1221   hydrALAZINE (APRESOLINE) injection 5 mg  5 mg Intravenous Q6H PRN Hugelmeyer, Alexis, DO       lactated ringers  infusion  150 mL/hr Intravenous Continuous Hugelmeyer, Alexis, DO 150 mL/hr at 05/18/24 1234 150 mL/hr at 05/18/24 1234   loratadine (CLARITIN) tablet 10 mg  10 mg Oral Daily Hugelmeyer, Alexis, DO   10 mg at 05/18/24 9166   methocarbamol  (ROBAXIN ) tablet 500 mg  500 mg Oral Q8H PRN Hugelmeyer, Alexis, DO   500 mg at 05/18/24 0936   morphine  (PF) 2 MG/ML injection 2 mg  2 mg Intravenous Q4H PRN Patel, Sona, MD       ondansetron  (ZOFRAN ) tablet 4 mg  4 mg Oral Q6H PRN Hugelmeyer, Alexis, DO       Or   ondansetron  (ZOFRAN ) injection 4 mg  4 mg Intravenous Q6H PRN Hugelmeyer, Alexis, DO   4 mg at 05/17/24 2102   oxybutynin (DITROPAN-XL) 24 hr tablet 10 mg  10 mg Oral Daily Hugelmeyer, Alexis, DO   10 mg at 05/18/24 0837   oxyCODONE  (Oxy IR/ROXICODONE ) immediate release tablet 5 mg  5 mg Oral  Q4H PRN Hugelmeyer, Alexis, DO   5 mg at 05/18/24 0936   pantoprazole (PROTONIX) EC tablet 40 mg  40 mg Oral Daily Hugelmeyer, Alexis, DO   40 mg at 05/18/24 0833   phenazopyridine (PYRIDIUM) tablet 100 mg  100 mg Oral TID WC Hugelmeyer, Alexis, DO   100 mg at 05/18/24 1222   senna-docusate (Senokot-S) tablet 1 tablet  1 tablet Oral QHS PRN Hugelmeyer, Alexis, DO       sodium chloride  flush (NS) 0.9 % injection 10-40 mL  10-40 mL Intracatheter Q12H Hugelmeyer, Alexis, DO   10 mL at 05/18/24 0941   sodium chloride  flush (NS) 0.9 % injection 10-40 mL  10-40 mL Intracatheter PRN Hugelmeyer, Alexis, DO   10 mL at 05/18/24 0217   sodium chloride  flush (NS) 0.9 % injection 3 mL  3 mL Intravenous Q12H Hugelmeyer, Alexis, DO   3 mL at 05/18/24 0941   tamsulosin (FLOMAX) capsule 0.4 mg  0.4 mg Oral Daily Hugelmeyer, Alexis, DO   0.4 mg at 05/18/24 9166   traZODone (DESYREL) tablet 25 mg  25 mg Oral QHS PRN Hugelmeyer, Alexis, DO         Abtx:  Anti-infectives (From admission, onward)    Start     Dose/Rate Route Frequency Ordered Stop   05/18/24 1000  acyclovir (ZOVIRAX) 200 MG capsule 400 mg        400 mg Oral Daily 05/18/24 0225     05/17/24 2200  ceFEPIme (MAXIPIME) 2 g in sodium chloride  0.9 % 100 mL IVPB        2 g 200 mL/hr over 30 Minutes Intravenous Every 8 hours 05/17/24 1953 05/24/24 2159   05/17/24 2130  ceFEPIme (MAXIPIME) 2 g in sodium chloride  0.9 %  100 mL IVPB  Status:  Discontinued        2 g 200 mL/hr over 30 Minutes Intravenous  Once 05/17/24 2043 05/17/24 2045   05/17/24 1615  cefTRIAXone (ROCEPHIN) 2 g in sodium chloride  0.9 % 100 mL IVPB        2 g 200 mL/hr over 30 Minutes Intravenous  Once 05/17/24 1602 05/17/24 1737       REVIEW OF SYSTEMS:  Const:  fever,  chills, negative weight loss Eyes: negative diplopia or visual changes, negative eye pain ENT: negative coryza, negative sore throat Resp: negative cough, hemoptysis, dyspnea Cards: negative for chest pain,  palpitations, lower extremity edema GU:  frequency, dysuria some red tinged urine. Suprapubic pain left flank pain GI: Negative for abdominal pain, diarrhea, bleeding, constipation Skin: negative for rash and pruritus Heme: negative for easy bruising and gum/nose bleeding MS: left thigh, calf an dfoot pain Left back pain Neurolo:tingling and numbness over left calf  Psych: negative for feelings of anxiety, depression  Endocrine: negative for thyroid, diabetes Allergy/Immunology- propafol nausea Objective:  VITALS:  BP (!) 148/66 (BP Location: Right Leg)   Pulse 82   Temp 98.2 F (36.8 C) (Axillary)   Resp 18   Ht 5' 4 (1.626 m)   Wt 72.6 kg   SpO2 98%   BMI 27.46 kg/m  LDA PORT PHYSICAL EXAM:  General: Alert, cooperative, some distress, appears stated age.  Head: Normocephalic, without obvious abnormality, atraumatic. Eyes: Conjunctivae clear, anicteric sclerae. Pupils are equal ENT Nares normal. No drainage or sinus tenderness.  Tongue coated with thrush Neck: Supple, symmetrical, no adenopathy, thyroid: non tender no carotid bruit and no JVD. Back: No CVA tenderness. Lungs: Clear to auscultation bilaterally. No Wheezing or Rhonchi. No rales. Heart: Regular rate and rhythm, no murmur, rub or gallop. Abdomen: Soft, tenderness suprapubic. Bowel sounds normal. No masses Extremities: left leg edema Skin: No rashes or lesions. Or bruising Lymph: Cervical, supraclavicular normal. Neurologic: Grossly non-focal Pertinent Labs Lab Results CBC    Component Value Date/Time   WBC 0.3 (LL) 05/18/2024 0425   RBC 3.43 (L) 05/18/2024 0425   HGB 10.6 (L) 05/18/2024 0425   HGB 12.9 05/10/2024 0838   HGB 14.5 09/17/2012 2231   HCT 31.1 (L) 05/18/2024 0425   HCT 41.5 09/17/2012 2231   PLT 43 (L) 05/18/2024 0425   PLT 180 05/10/2024 0838   PLT 186 09/17/2012 2231   MCV 90.7 05/18/2024 0425   MCV 92 09/17/2012 2231   MCH 30.9 05/18/2024 0425   MCHC 34.1 05/18/2024 0425   RDW  12.4 05/18/2024 0425   RDW 12.7 09/17/2012 2231   LYMPHSABS 0.1 (L) 05/17/2024 1613   LYMPHSABS 0.9 (L) 09/17/2012 2231   MONOABS 0.0 (L) 05/17/2024 1613   MONOABS 0.5 09/17/2012 2231   EOSABS 0.0 05/17/2024 1613   EOSABS 0.0 09/17/2012 2231   BASOSABS 0.0 05/17/2024 1613   BASOSABS 0.0 09/17/2012 2231       Latest Ref Rng & Units 05/18/2024    4:25 AM 05/17/2024    4:13 PM 05/12/2024    3:08 PM  CMP  Glucose 70 - 99 mg/dL 870  845  845   BUN 8 - 23 mg/dL 10  12  23    Creatinine 0.44 - 1.00 mg/dL 9.55  9.56  9.45   Sodium 135 - 145 mmol/L 134  130  134   Potassium 3.5 - 5.1 mmol/L 3.6  4.0  3.7   Chloride 98 - 111 mmol/L 101  95  101   CO2 22 - 32 mmol/L 27  24  24    Calcium 8.9 - 10.3 mg/dL 8.4  9.0  8.6   Total Protein 6.5 - 8.1 g/dL 5.9  7.5  7.0   Total Bilirubin 0.0 - 1.2 mg/dL 0.9  1.3  0.7   Alkaline Phos 38 - 126 U/L 49  64  55   AST 15 - 41 U/L 12  16  20    ALT 0 - 44 U/L 7  10  12        Microbiology: Recent Results (from the past 240 hours)  Urine Culture     Status: None (Preliminary result)   Collection Time: 05/17/24  3:58 PM   Specimen: Urine, Clean Catch  Result Value Ref Range Status   Specimen Description   Final    URINE, CLEAN CATCH Performed at [facility], [address] [place], [place] [address]    Special Requests   Final    NONE Reflexed from [id number] Performed at [facility], [address] [place], [place] [address]    Culture PENDING  Incomplete   Report Status PENDING  Incomplete  Blood Culture (routine x 2)     Status: None (Preliminary result)   Collection Time: 05/17/24  4:13 PM   Specimen: BLOOD  Result Value Ref Range Status   Specimen Description BLOOD BLOOD RIGHT FOREARM  Final   Special Requests   Final    BOTTLES DRAWN AEROBIC AND ANAEROBIC Blood Culture results may not be optimal due to an inadequate volume of blood received in culture bottles   Culture   Final    NO GROWTH < 12 HOURS Performed at  [facility], [address], [place], [place] [address]    Report Status PENDING  Incomplete  Blood Culture (routine x 2)     Status: None (Preliminary result)   Collection Time: 05/17/24  4:13 PM   Specimen: BLOOD  Result Value Ref Range Status   Specimen Description BLOOD LEFT ANTECUBITAL  Final   Special Requests   Final    BOTTLES DRAWN AEROBIC AND ANAEROBIC Blood Culture results may not be optimal due to an inadequate volume of blood received in culture bottles   Culture   Final    NO GROWTH < 12 HOURS Performed at [facility], [address] [place], [place] [address]    Report Status PENDING  Incomplete  Resp panel by RT-PCR (RSV, Flu A&B, Covid) Anterior Nasal Swab     Status: None   Collection Time: 05/17/24  8:09 PM   Specimen: Anterior Nasal Swab  Result Value Ref Range Status   SARS Coronavirus 2 by RT PCR NEGATIVE NEGATIVE Final    Comment: (NOTE) SARS-CoV-2 target nucleic acids are NOT DETECTED.  The SARS-CoV-2 RNA is generally detectable in upper respiratory specimens during the acute phase of infection. The lowest concentration of SARS-CoV-2 viral copies this assay can detect is 138 copies/mL. A negative result does not preclude SARS-Cov-2 infection and should not be used as the sole basis for treatment or other patient management decisions. A negative result may occur with  improper specimen collection/handling, submission of specimen other than nasopharyngeal swab, presence of viral mutation(s) within the areas targeted by this assay, and inadequate number of viral copies(<138 copies/mL). A negative result must be combined with clinical observations, patient history, and epidemiological information. The expected result is Negative.  Fact Sheet for Patients:  bloggercourse.com  Fact Sheet  for Healthcare Providers:  seriousbroker.it  This test is no t yet approved or cleared by the [place]  [place] FDA and  has been authorized for detection and/or diagnosis of SARS-CoV-2 by FDA under an Emergency Use Authorization (EUA). This EUA will remain  in effect (meaning this test can be used) for the duration of the COVID-19 declaration under Section 564(b)(1) of the Act, 21 U.S.C.section 360bbb-3(b)(1), unless the authorization is terminated  or revoked sooner.       Influenza A by PCR NEGATIVE NEGATIVE Final   Influenza B by PCR NEGATIVE NEGATIVE Final    Comment: (NOTE) The Xpert Xpress SARS-CoV-2/FLU/RSV plus assay is intended as an aid in the diagnosis of influenza from Nasopharyngeal swab specimens and should not be used as a sole basis for treatment. Nasal washings and aspirates are unacceptable for Xpert Xpress SARS-CoV-2/FLU/RSV testing.  Fact Sheet for Patients: bloggercourse.com  Fact Sheet for Healthcare Providers: seriousbroker.it  This test is not yet approved or cleared by the [place]  FDA and has been authorized for detection and/or diagnosis of SARS-CoV-2 by FDA under an Emergency Use Authorization (EUA). This EUA will remain in effect (meaning this test can be used) for the duration of the COVID-19 declaration under Section 564(b)(1) of the Act, 21 U.S.C. section 360bbb-3(b)(1), unless the authorization is terminated or revoked.     Resp Syncytial Virus by PCR NEGATIVE NEGATIVE Final    Comment: (NOTE) Fact Sheet for Patients: bloggercourse.com  Fact Sheet for Healthcare Providers: seriousbroker.it  This test is not yet approved or cleared by the [place]  FDA and has been authorized for detection and/or diagnosis of SARS-CoV-2 by FDA under an Emergency Use Authorization (EUA). This EUA will remain in effect (meaning this test can be used) for the duration of the COVID-19 declaration under Section 564(b)(1) of the Act, 21 U.S.C. section  360bbb-3(b)(1), unless the authorization is terminated or revoked.  Performed at [facility], [address] [place], [place] [address]     Lines and Device Date on insertion # of days DC  Central line     Foley     ETT       IMAGING RESULTS: left pelvic mass has a small phlegmon/abscess/  I have personally reviewed the films ? Impression/Recommendation ? 63 y.o. with a history of diffuse large B cell lymphoma Received first cycle of R-CHOP on 05/10/24- and on D3 got GCSF Febrile neutropenia-  Complicated UTI Pt has left ureteric stent for hydronephrosis secondary to left pelvic mass. Stent placed on 04/08/24 Dysuria severe in spite of 4 doses of cefepime No recent antibiotic use But because of stent remote possibility of ESBL and enterococcus apart from regular Gram neg will do meropenem  instead . Will adjust antibiotic depending on culture  Diffuse large b cell lymphoma in the left pelvic area- no other sites- possible spleen involvement Has received 1 dose of R-CHOP There is some phlegmon/small abscess in the center of the mass- could be necrosis due to chemo- Meropenem will cover anerobes as well  PORT - if fever or neutropenia persist then may have to add MRSA coverage  Anemia Thrombocytopenia due to chemo  On acyclovir prophylaxis  Rt lower lobe subsegmental PE  Left leg edma secondary to the tumor possibly Recommend Screening for DVT  This consult involved complex antimicrobial management  I have personally spent  --60 -minutes involved in face-to-face and non-face-to-face activities for this patient on the day of the visit. Professional time spent includes the  following activities: Preparing to see the patient (review of tests), Obtaining and/or reviewing separately obtained history (admission/discharge record), Performing a medically appropriate examination and/or evaluation , Ordering medications/tests/procedures, referring and communicating with  other health care professionals, Documenting clinical information in the EMR, Independently interpreting results (not separately reported), Communicating results to the patient/r, Counseling and educating the patient/and Care coordination with ID pharmacist   Dr.Fitzgerald will see her tomorrow    ________________________________________________

## 2024-05-18 NOTE — Progress Notes (Signed)
 PHARMACY - ANTICOAGULATION CONSULT NOTE  Pharmacy Consult for Heparin  Indication: pulmonary embolus  Allergies  Allergen Reactions   Propofol  Nausea Only    Patient states medication can be tolerated with an antiemetic before taking    Patient Measurements: Height: 5' 4 (162.6 cm) Weight: 72.6 kg (160 lb) IBW/kg (Calculated) : 54.7 HEPARIN DW (KG): 69.6  Vital Signs: Temp: 98.4 F (36.9 C) (11/06 1556) Temp Source: Axillary (11/06 1143) BP: 158/73 (11/06 1556) Pulse Rate: 83 (11/06 1556)  Labs: Recent Labs    05/17/24 1613 05/17/24 2136 05/18/24 0425 05/18/24 1012 05/18/24 1830  HGB 12.5  --  10.6*  --   --   HCT 35.6*  --  31.1*  --   --   PLT 58*  --  43*  --   --   APTT  --  26  --   --   --   LABPROT 12.9  --   --   --   --   INR 0.9  --   --   --   --   HEPARINUNFRC  --   --  0.44 0.26* 0.27*  CREATININE 0.43*  --  0.44  --   --   TROPONINIHS 4  --   --   --   --     Estimated Creatinine Clearance: 72.2 mL/min (by C-G formula based on SCr of 0.44 mg/dL).   Medical History: Past Medical History:  Diagnosis Date   Arthritis    left shoulder, neck, lower back  (01/11/2018)   Chronic lower back pain    Family history of adverse reaction to anesthesia    Mother has nausea   GERD (gastroesophageal reflux disease)    PONV (postoperative nausea and vomiting)    Pre-diabetes    UTI (urinary tract infection) 03/20/2021    Medications:  Medications Prior to Admission  Medication Sig Dispense Refill Last Dose/Taking   acetaminophen  (TYLENOL ) 500 MG tablet Take 500 mg by mouth every 6 (six) hours as needed for mild pain (pain score 1-3).   05/17/2024   acyclovir (ZOVIRAX) 400 MG tablet Take 1 tablet (400 mg total) by mouth daily. 30 tablet 3 05/17/2024   allopurinol (ZYLOPRIM) 300 MG tablet Take 1 tablet (300 mg total) by mouth daily. 30 tablet 3 05/17/2024   fentaNYL  (DURAGESIC ) 25 MCG/HR Place 1 patch onto the skin every 3 (three) days. 5 patch 0 Taking    gabapentin  (NEURONTIN ) 100 MG capsule Take 1 capsule (100 mg total) by mouth 3 (three) times daily. 90 capsule 0 05/17/2024   lidocaine -prilocaine (EMLA) cream Apply to affected area once 30 g 3 Unknown   loratadine (CLARITIN) 10 MG tablet Take 10 mg by mouth at bedtime.   05/16/2024 Bedtime   magic mouthwash (multi-ingredient) oral suspension Swish and swallow 5-10 mLs 4 (four) times daily as needed for mouth pain. 480 mL 1 Unknown   methocarbamol  (ROBAXIN ) 500 MG tablet Take 500 mg by mouth every 8 (eight) hours as needed for muscle spasms.  0 Unknown   omeprazole  (PRILOSEC) 20 MG capsule TAKE 1 CAPSULE (20 MG TOTAL) BY MOUTH 2 (TWO) TIMES DAILY BEFORE A MEAL. 180 capsule 0 Unknown   ondansetron  (ZOFRAN ) 8 MG tablet Take 1 tablet (8 mg total) by mouth every 8 (eight) hours as needed for nausea or vomiting. Start on the third day after cyclophosphamide chemotherapy. 30 tablet 1 Unknown   oxybutynin (DITROPAN-XL) 10 MG 24 hr tablet Take 1 tablet (10 mg total) by  mouth daily. 30 tablet 0 05/17/2024   oxyCODONE  (OXY IR/ROXICODONE ) 5 MG immediate release tablet Take 1 tablet (5 mg total) by mouth every 4 (four) hours as needed. 90 tablet 0 Unknown   predniSONE  (DELTASONE ) 20 MG tablet Take 5 tablets (100 mg total) by mouth daily. Take with food on days 2-5 of chemotherapy. 20 tablet 5 Past Month   prochlorperazine (COMPAZINE) 10 MG tablet Take 1 tablet (10 mg total) by mouth every 6 (six) hours as needed for nausea or vomiting. 30 tablet 6 Unknown   sodium phosphate  (FLEET) ENEM Place 133 mLs (1 enema total) rectally daily as needed for severe constipation. 3 mL 0 Unknown   tamsulosin (FLOMAX) 0.4 MG CAPS capsule Take 1 capsule (0.4 mg total) by mouth daily. 30 capsule 0 05/17/2024    Assessment: Pharmacy consulted to dose heparin in this 63 year old female admitted with PE.  No prior anticoag noted. CrCl = 72.2 ml/min   CT Abd Pelvis: Tiny nonocclusive embolus in the right lower lobe subsegmental  branch.   Date Time Results Comments 11/6 0425 HL 0.44 Therapeutic, rate 1150 units/hr 11/6 1012 HL 0.26  Subtherapeutic 11/6 1830 HL 0.27 Subtherapeutic,1250 unuts/hr  Goal of Therapy:  Heparin level 0.3-0.7 units/ml Monitor platelets by anticoagulation protocol: Yes   Plan: NO BOLUS - okay by Dr Tobie - HL remains subtherapeutic, will increase heparin infusion rate to 1400 units/hr - Recheck HL 6 hrs after rate change  - CBC daily   Ravenna Legore Rodriguez-Guzman PharmD, BCPS 05/18/2024 7:07 PM

## 2024-05-18 NOTE — Consult Note (Signed)
 Hematology/Oncology Consult note [phone] [phone]      Patient Care Team: [name], DEVONNA as PCP - General (Physician Assistant) Babara Call, MD as Consulting Physician (Oncology)   Name of the patient: Stacey [name]  [id number]  Apr 19, 1961   REASON FOR COSULTATION:  Febrile neutropenia History of presenting illness-  63 y.o. 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  She reports feeling well at baseline until 05/16/2024 when she noticed dysuria, suprapubic pain.  Fever on 05/17/2024 morning.  In the emergency room, patient was tachycardic, temperature of 100.1, BP 136/101.  CBC showed total WBC of 0.1, ANC 0.  Platelet count 58,000.  Hemoglobin 12.5.  Urine analysis showed large hemoglobin, negative for nitrate and leukocyte.  Urine culture is pending lactic acid is 1.7.  Negative respiratory panel  05/18/2023 CT abdomen pelvis with contrast showed 1. Left ureteral stent with mild left hydronephrosis and findings suggestive of ascending UTI. Correlation with urinalysis recommended. 2. Interval decrease in the masslike soft tissues thickening of the left pelvic sidewall. A 1.4 x 1.2 cm rim enhancing low attenuating focus within this soft tissue mass suspicious for an area of phlegmonous change or developing abscess. 3. Tiny nonocclusive embolus in the right lower lobe subsegmental branch. Chest CT may provide better evaluation  CT chest angiogram showed age-indeterminate linear nonocclusive right lower lobe segmental pulmonary artery embolus.  No CT evidence of right heart strain.  Patient was admitted for febrile neutropenia.  She was started on IV antibiotics suspect UTI complicated by left side ureteral stent.  Oncology was consulted for further evaluation.  Patient reports that the severe left lower back/hip has  improved except she continues to have left calf/ankle tingling burning pain. She reports severe pain and discomfort when passing urine.  She feels the pain is different than her previous pain secondary to neoplasm.  Allergies  Allergen Reactions   Propofol  Nausea Only    Patient states medication can be tolerated with an antiemetic before taking    Patient Active Problem List   Diagnosis Date Noted   Diffuse large B-cell lymphoma (HCC) 04/26/2024    Priority: High   Neoplasm related pain 05/10/2024    Priority: Medium    Encounter for antineoplastic chemotherapy 05/10/2024    Priority: Medium    Sepsis (HCC) 05/18/2024   Pulmonary embolism (HCC) 05/18/2024   Febrile neutropenia 05/17/2024   Pre-diabetes    Hydronephrosis of left kidney 04/08/2024   AKI (acute kidney injury) 04/08/2024   Hx of total shoulder replacement, right 02/08/2020   Osteoarthritis of left shoulder 01/11/2018   S/P shoulder replacement, left 01/11/2018     Past Medical History:  Diagnosis Date   Arthritis    left shoulder, neck, lower back  (01/11/2018)   Chronic lower back pain    Family history of adverse reaction to anesthesia    Mother has nausea   GERD (gastroesophageal reflux disease)    PONV (postoperative nausea and vomiting)    Pre-diabetes    UTI (urinary tract infection) 03/20/2021     Past Surgical History:  Procedure Laterality Date   APPENDECTOMY     CYSTOSCOPY W/ URETERAL STENT PLACEMENT Left 04/08/2024   Procedure: CYSTOSCOPY, WITH RETROGRADE PYELOGRAM AND URETERAL STENT INSERTION;  Surgeon: Watt Rush, MD;  Location: [facility] ORS;  Service: Urology;  Laterality: Left;   IR IMAGING GUIDED PORT INSERTION  05/01/2024   JOINT REPLACEMENT     TOTAL SHOULDER ARTHROPLASTY Left 01/11/2018   TOTAL SHOULDER ARTHROPLASTY Left 01/11/2018   Procedure: LEFT TOTAL SHOULDER ARTHROPLASTY;  Surgeon: Sharl Selinda Dover, MD;  Location: [facility] OR;  Service: Orthopedics;  Laterality: Left;  2.5 hrs    TOTAL SHOULDER ARTHROPLASTY Right 02/08/2020   Procedure: TOTAL SHOULDER ARTHROPLASTY;  Surgeon: Sharl Selinda Dover, MD;  Location: [facility] OR;  Service: Orthopedics;  Laterality: Right;  2.5 hrs RNFA    Social History   Socioeconomic History   Marital status: Married    Spouse name: Amandeep Hogston   Number of children: Not on file   Years of education: Not on file   Highest education level: Not on file  Occupational History   Not on file  Tobacco Use   Smoking status: Never   Smokeless tobacco: Never  Vaping Use   Vaping status: Never Used  Substance and Sexual Activity   Alcohol use: Not Currently    Comment: 01/11/2018 might have 1 drink/month   Drug use: Never   Sexual activity: Yes  Other Topics Concern   Not on file  Social History Narrative   Not on file   Social Drivers of Health   Financial Resource Strain: Low Risk  (03/30/2024)   Received from [facility]   Overall Financial Resource Strain (CARDIA)    Difficulty of Paying Living Expenses: Not hard at all  Food Insecurity: No Food Insecurity (05/17/2024)   Hunger Vital Sign    Worried About Running Out of Food in the Last Year: Never true    Ran Out of Food in the Last Year: Never true  Transportation Needs: No Transportation Needs (05/17/2024)   PRAPARE - Administrator, Civil Service (Medical): No    Lack of Transportation (Non-Medical): No  Physical Activity: Not on file  Stress: Not on file  Social Connections: Socially Integrated (05/17/2024)   Social Connection and Isolation Panel    Frequency of Communication with Friends and Family: Three times a week    Frequency of Social Gatherings with Friends and Family: Three times a week    Attends Religious Services: 1 to 4 times per year    Active Member of Clubs or Organizations: Yes    Attends Banker Meetings: 1 to 4 times per year    Marital Status: Married  Catering Manager Violence: Not At Risk (05/17/2024)    Humiliation, Afraid, Rape, and Kick questionnaire    Fear of Current or Ex-Partner: No    Emotionally Abused: No    Physically Abused: No    Sexually Abused: No     Family History  Problem Relation Age of Onset   Hypertension Mother    Heart disease Father    Heart attack Father    CAD Father    Colon cancer Neg Hx    Esophageal cancer Neg Hx    Rectal cancer Neg Hx    Stomach cancer Neg Hx      Current Facility-Administered Medications:    acetaminophen  (TYLENOL ) tablet 650 mg, 650 mg, Oral, Q6H PRN, 650 mg at 05/18/24 1457 **OR** acetaminophen  (TYLENOL ) suppository 650 mg, 650 mg, Rectal, Q6H PRN, Hugelmeyer, Alexis, DO   acyclovir (ZOVIRAX) 200 MG capsule 400 mg, 400 mg, Oral, Daily, Hugelmeyer, Alexis, DO, 400 mg at 05/18/24 0833   allopurinol (ZYLOPRIM) tablet 300 mg, 300 mg, Oral, Daily, Hugelmeyer, Alexis, DO, 300 mg at 05/18/24 0832   bisacodyl (DULCOLAX) EC tablet  5 mg, 5 mg, Oral, Daily PRN, Hugelmeyer, Alexis, DO   ceFEPIme (MAXIPIME) 2 g in sodium chloride  0.9 % 100 mL IVPB, 2 g, Intravenous, Q8H, Lenon Elsie HERO, RPH, Last Rate: 200 mL/hr at 05/18/24 1331, 2 g at 05/18/24 1331   chlorhexidine  (PERIDEX ) 0.12 % solution 15 mL, 15 mL, Mouth/Throat, BID, Babara Call, MD   Chlorhexidine  Gluconate Cloth 2 % PADS 6 each, 6 each, Topical, Daily, Hugelmeyer, Alexis, DO   diclofenac Sodium (VOLTAREN) 1 % topical gel 2 g, 2 g, Topical, QID, Patel, Sona, MD, 2 g at 05/18/24 1222   feeding supplement (ENSURE PLUS HIGH PROTEIN) liquid 237 mL, 237 mL, Oral, BID BM, Hugelmeyer, Alexis, DO, 237 mL at 05/18/24 1329   fentaNYL  (DURAGESIC ) 25 MCG/HR 1 patch, 1 patch, Transdermal, Q72H, Hugelmeyer, Alexis, DO, 1 patch at 05/18/24 0423   gabapentin  (NEURONTIN ) capsule 300 mg, 300 mg, Oral, BID, Babara Call, MD   heparin ADULT infusion 100 units/mL (25000 units/250mL), 1,250 Units/hr, Intravenous, Continuous, Hallaji, Sheema M, RPH, Last Rate: 12.5 mL/hr at 05/18/24 1221, 1,250 Units/hr at  05/18/24 1221   hydrALAZINE (APRESOLINE) injection 5 mg, 5 mg, Intravenous, Q6H PRN, Hugelmeyer, Alexis, DO   lactated ringers  infusion, 150 mL/hr, Intravenous, Continuous, Hugelmeyer, Alexis, DO, Last Rate: 150 mL/hr at 05/18/24 1234, 150 mL/hr at 05/18/24 1234   loratadine (CLARITIN) tablet 10 mg, 10 mg, Oral, Daily, Hugelmeyer, Alexis, DO, 10 mg at 05/18/24 9166   methocarbamol  (ROBAXIN ) tablet 500 mg, 500 mg, Oral, Q8H PRN, Hugelmeyer, Alexis, DO, 500 mg at 05/18/24 0936   morphine  (PF) 2 MG/ML injection 2 mg, 2 mg, Intravenous, Q4H PRN, Patel, Sona, MD, 2 mg at 05/18/24 1327   ondansetron  (ZOFRAN ) tablet 4 mg, 4 mg, Oral, Q6H PRN **OR** ondansetron  (ZOFRAN ) injection 4 mg, 4 mg, Intravenous, Q6H PRN, Hugelmeyer, Alexis, DO, 4 mg at 05/17/24 2102   oxybutynin (DITROPAN-XL) 24 hr tablet 10 mg, 10 mg, Oral, Daily, Hugelmeyer, Alexis, DO, 10 mg at 05/18/24 0837   oxyCODONE  (Oxy IR/ROXICODONE ) immediate release tablet 5 mg, 5 mg, Oral, Q4H PRN, Hugelmeyer, Alexis, DO, 5 mg at 05/18/24 1351   pantoprazole (PROTONIX) EC tablet 40 mg, 40 mg, Oral, Daily, Hugelmeyer, Alexis, DO, 40 mg at 05/18/24 0833   phenazopyridine (PYRIDIUM) tablet 100 mg, 100 mg, Oral, TID WC, Hugelmeyer, Alexis, DO, 100 mg at 05/18/24 1222   senna-docusate (Senokot-S) tablet 1 tablet, 1 tablet, Oral, QHS PRN, Hugelmeyer, Alexis, DO   sodium chloride  flush (NS) 0.9 % injection 10-40 mL, 10-40 mL, Intracatheter, Q12H, Hugelmeyer, Alexis, DO, 10 mL at 05/18/24 0941   sodium chloride  flush (NS) 0.9 % injection 10-40 mL, 10-40 mL, Intracatheter, PRN, Hugelmeyer, Alexis, DO, 10 mL at 05/18/24 0217   sodium chloride  flush (NS) 0.9 % injection 3 mL, 3 mL, Intravenous, Q12H, Hugelmeyer, Alexis, DO, 3 mL at 05/18/24 0941   tamsulosin (FLOMAX) capsule 0.4 mg, 0.4 mg, Oral, Daily, Hugelmeyer, Alexis, DO, 0.4 mg at 05/18/24 9166   traZODone (DESYREL) tablet 25 mg, 25 mg, Oral, QHS PRN, Hugelmeyer, Alexis, DO  Review of Systems   Constitutional:  Negative for appetite change, chills, fatigue and fever.  HENT:   Negative for hearing loss and voice change.   Eyes:  Negative for eye problems.  Respiratory:  Negative for chest tightness and cough.   Cardiovascular:  Negative for chest pain.  Gastrointestinal:  Negative for abdominal distention and blood in stool.       Suprapubic pain  Endocrine: Negative for hot flashes.  Genitourinary:  Positive for dysuria. Negative for frequency.   Musculoskeletal:  Negative for arthralgias.       Left calf and ankle numbness and tingling  Skin:  Negative for itching and rash.  Neurological:  Negative for extremity weakness.  Hematological:  Negative for adenopathy.  Psychiatric/Behavioral:  Negative for confusion.     PHYSICAL EXAM Vitals:   05/18/24 0426 05/18/24 0830 05/18/24 1143 05/18/24 1556  BP: (!) 151/80 (!) 176/69 (!) 148/66 (!) 158/73  Pulse: 81 76 82 83  Resp:   18 18  Temp: 98.4 F (36.9 C) 98.7 F (37.1 C) 98.2 F (36.8 C) 98.4 F (36.9 C)  TempSrc: Oral Axillary Axillary   SpO2: 100% 100% 98% 99%  Weight:      Height:       Physical Exam    LABORATORY STUDIES    Latest Ref Rng & Units 05/18/2024    4:25 AM 05/17/2024    4:13 PM 05/12/2024    3:08 PM  CBC  WBC 4.0 - 10.5 K/uL 0.3  0.1  5.1   Hemoglobin 12.0 - 15.0 g/dL 89.3  87.4  87.5   Hematocrit 36.0 - 46.0 % 31.1  35.6  35.8   Platelets 150 - 400 K/uL 43  58  189       Latest Ref Rng & Units 05/18/2024    4:25 AM 05/17/2024    4:13 PM 05/12/2024    3:08 PM  CMP  Glucose 70 - 99 mg/dL 870  845  845   BUN 8 - 23 mg/dL 10  12  23    Creatinine 0.44 - 1.00 mg/dL 9.55  9.56  9.45   Sodium 135 - 145 mmol/L 134  130  134   Potassium 3.5 - 5.1 mmol/L 3.6  4.0  3.7   Chloride 98 - 111 mmol/L 101  95  101   CO2 22 - 32 mmol/L 27  24  24    Calcium 8.9 - 10.3 mg/dL 8.4  9.0  8.6   Total Protein 6.5 - 8.1 g/dL 5.9  7.5  7.0   Total Bilirubin 0.0 - 1.2 mg/dL 0.9  1.3  0.7   Alkaline Phos 38 -  126 U/L 49  64  55   AST 15 - 41 U/L 12  16  20    ALT 0 - 44 U/L 7  10  12       RADIOGRAPHIC STUDIES: I have personally reviewed the radiological images as listed and agreed with the findings in the report. CT Angio Chest Pulmonary Embolism (PE) W or WO Contrast Result Date: 05/18/2024 CLINICAL DATA:  Concern for pulmonary embolism.  Lymphoma. EXAM: CT ANGIOGRAPHY CHEST WITH CONTRAST TECHNIQUE: Multidetector CT imaging of the chest was performed using the standard protocol during bolus administration of intravenous contrast. Multiplanar CT image reconstructions and MIPs were obtained to evaluate the vascular anatomy. RADIATION DOSE REDUCTION: This exam was performed according to the departmental dose-optimization program which includes automated exposure control, adjustment of the mA and/or kV according to patient size and/or use of iterative reconstruction technique. CONTRAST:  50mL OMNIPAQUE IOHEXOL 350 MG/ML SOLN COMPARISON:  Chest radiograph dated 05/17/2024. FINDINGS: Cardiovascular: There is no cardiomegaly or pericardial effusion. The thoracic aorta is unremarkable. The origins of the great vessels of the aortic arch are patent. Right-sided Port-A-Cath with tip in the right atrium at the cavoatrial junction. Linear nonocclusive right lower lobe subsegmental pulmonary artery embolus, age indeterminate. No CT evidence of right heart straining. Mediastinum/Nodes: No hilar or  mediastinal adenopathy. The esophagus and the thyroid gland are grossly unremarkable no mediastinal fluid collection. Lungs/Pleura: Minimal bibasilar linear atelectasis. No focal consolidation, pleural effusion, or pneumothorax. The central airways are patent. Upper Abdomen: No acute abnormality. Musculoskeletal: Degenerative changes of spine. No acute osseous pathology. Bilateral shoulder arthroplasties. Review of the MIP images confirms the above findings. IMPRESSION: 1. Age indeterminate linear nonocclusive right lower lobe  subsegmental pulmonary artery embolus. No CT evidence of right heart straining. 2. No focal consolidation. These results will be called to the ordering clinician or representative by the Radiologist Assistant, and communication documented in the PACS or [employer]. Electronically Signed   By: Vanetta Chou M.D.   On: 05/18/2024 14:56   CT ABDOMEN PELVIS W CONTRAST Result Date: 05/17/2024 CLINICAL DATA:  Abdominal pain.  Concern for kidney stone. EXAM: CT ABDOMEN AND PELVIS WITH CONTRAST TECHNIQUE: Multidetector CT imaging of the abdomen and pelvis was performed using the standard protocol following bolus administration of intravenous contrast. RADIATION DOSE REDUCTION: This exam was performed according to the departmental dose-optimization program which includes automated exposure control, adjustment of the mA and/or kV according to patient size and/or use of iterative reconstruction technique. CONTRAST:  100mL OMNIPAQUE IOHEXOL 300 MG/ML  SOLN COMPARISON:  CT abdomen pelvis dated 04/08/2024. FINDINGS: Lower chest: The visualized lung bases are clear. Tiny nonocclusive embolus in the right lower lobe subsegmental branch (2/2). Chest CT may provide better evaluation. No intra-abdominal free air or free fluid. Hepatobiliary: The liver is unremarkable. No biliary dilatation. The gallbladder is unremarkable. Pancreas: Unremarkable. No pancreatic ductal dilatation or surrounding inflammatory changes. Spleen: Normal in size without focal abnormality. Adrenals/Urinary Tract: The adrenal glands unremarkable. Left ureteral stent with proximal tip in the inferior pole collecting system and distal end in the urinary bladder. There is mild left hydronephrosis. There is urothelial enhancement of the left renal collecting system and ureter suggestive of ascending UTI. No stone identified. The right kidney, right ureter, and urinary bladder appear unremarkable. Stomach/Bowel: Moderate stool throughout the colon. There  is no bowel obstruction or active inflammation. Appendectomy. Vascular/Lymphatic: The abdominal aorta and IVC unremarkable. No portal venous gas. There is no adenopathy. Reproductive: The uterus is grossly unremarkable. Other: Interval decrease in the masslike soft tissues thickening of the left pelvic sidewall measuring approximately 5.8 x 3.3 cm in greatest axial dimensions (previously 6.7 x 4.8 cm). A 1.4 x 1.2 cm rim enhancing low attenuating focus within this soft tissue mass (coronal 52/5) suspicious for an area of phlegmonous change or developing abscess. Musculoskeletal: Degenerative changes of the spine. No acute osseous pathology. IMPRESSION: 1. Left ureteral stent with mild left hydronephrosis and findings suggestive of ascending UTI. Correlation with urinalysis recommended. 2. Interval decrease in the masslike soft tissues thickening of the left pelvic sidewall. A 1.4 x 1.2 cm rim enhancing low attenuating focus within this soft tissue mass suspicious for an area of phlegmonous change or developing abscess. 3. Tiny nonocclusive embolus in the right lower lobe subsegmental branch. Chest CT may provide better evaluation. These results were called by telephone at the time of interpretation on 05/17/2024 at 6:28 pm to provider LAMAR PRICE , who verbally acknowledged these results. Electronically Signed   By: Vanetta Chou M.D.   On: 05/17/2024 18:39   DG Chest Port 1 View Result Date: 05/17/2024 CLINICAL DATA:  Sepsis. EXAM: PORTABLE CHEST 1 VIEW COMPARISON:  Chest radiograph dated 09/17/2012 FINDINGS: Right-sided PICC with tip over central SVC. No focal consolidation, pleural effusion, pneumothorax. The cardiac silhouette  is within limits. No acute osseous pathology. Bilateral shoulder arthroplasties. IMPRESSION: No active disease. Electronically Signed   By: Vanetta Chou M.D.   On: 05/17/2024 16:38   IR IMAGING GUIDED PORT INSERTION Result Date: 05/01/2024 CLINICAL DATA:  Large B-cell  lymphoma of the pelvis and need for porta cath to begin chemotherapy. EXAM: IMPLANTED PORT A CATH PLACEMENT WITH ULTRASOUND AND FLUOROSCOPIC GUIDANCE ANESTHESIA/SEDATION: Moderate (conscious) sedation was employed during this procedure. A total of Versed  1.0 mg and Fentanyl  50 mcg was administered intravenously. Moderate Sedation Time: 28 minutes. The patient's level of consciousness and vital signs were monitored continuously by radiology nursing throughout the procedure under my direct supervision. FLUOROSCOPY: Radiation Exposure Index: 1.9 mGy Kerma PROCEDURE: The procedure, risks, benefits, and alternatives were explained to the patient. Questions regarding the procedure were encouraged and answered. The patient understands and consents to the procedure. A time-out was performed prior to initiating the procedure. Ultrasound was utilized to confirm patency of the right internal jugular vein. An ultrasound image was saved and recorded. The right neck and chest were prepped with chlorhexidine  in a sterile fashion, and a sterile drape was applied covering the operative field. Maximum barrier sterile technique with sterile gowns and gloves were used for the procedure. Local anesthesia was provided with 1% lidocaine . After creating a small venotomy incision, a 21 gauge needle was advanced into the right internal jugular vein under direct, real-time ultrasound guidance. Ultrasound image documentation was performed. After securing guidewire access, an 8 Fr dilator was placed. A J-wire was kinked to measure appropriate catheter length. A subcutaneous port pocket was then created along the upper chest wall utilizing sharp and blunt dissection. Portable cautery was utilized. The pocket was irrigated with sterile saline. A single lumen power injectable port was chosen for placement. The 8 Fr catheter was tunneled from the port pocket site to the venotomy incision. The port was placed in the pocket. External catheter was  trimmed to appropriate length based on guidewire measurement. At the venotomy, an 8 Fr peel-away sheath was placed over a guidewire. The catheter was then placed through the sheath and the sheath removed. Final catheter positioning was confirmed and documented with a fluoroscopic spot image. The port was accessed with a needle and aspirated and flushed with heparinized saline. The access needle was removed. The venotomy and port pocket incisions were closed with subcutaneous 3-0 Monocryl and subcuticular 4-0 Vicryl. Dermabond was applied to both incisions. COMPLICATIONS: COMPLICATIONS None FINDINGS: After catheter placement, the tip lies at the cavo-atrial junction. The catheter aspirates normally and is ready for immediate use. IMPRESSION: Placement of single lumen port a cath via right internal jugular vein. The catheter tip lies at the cavo-atrial junction. A power injectable port a cath was placed and is ready for immediate use. Electronically Signed   By: Marcey Moan M.D.   On: 05/01/2024 11:53   CT BONE MARROW BIOPSY & ASPIRATION Result Date: 05/01/2024 CLINICAL DATA:  Large B-cell lymphoma of pelvic sidewall and need for bone marrow biopsy. EXAM: CT GUIDED BONE MARROW ASPIRATION AND BIOPSY ANESTHESIA/SEDATION: Moderate (conscious) sedation was employed during this procedure. A total of Versed  2.0 mg and Fentanyl  100 mcg was administered intravenously. Moderate Sedation Time: 10 minutes. The patient's level of consciousness and vital signs were monitored continuously by radiology nursing throughout the procedure under my direct supervision. PROCEDURE: The procedure risks, benefits, and alternatives were explained to the patient. Questions regarding the procedure were encouraged and answered. The patient understands and consents to  the procedure. A time out was performed prior to initiating the procedure. The right gluteal region was prepped with chlorhexidine . Sterile gown and sterile gloves were used  for the procedure. Local anesthesia was provided with 1% Lidocaine . Under CT guidance, an 11 gauge On Control bone cutting needle was advanced from a posterior approach into the right iliac bone. Needle positioning was confirmed with CT. Initial non heparinized and heparinized aspirate samples were obtained of bone marrow. Core biopsy was performed via the On Control drill needle. COMPLICATIONS: None FINDINGS: Inspection of initial aspirate did reveal visible particles. Intact core biopsy sample was obtained. IMPRESSION: CT guided bone marrow biopsy of right posterior iliac bone with both aspirate and core samples obtained. Electronically Signed   By: Marcey Moan M.D.   On: 05/01/2024 10:41   ECHOCARDIOGRAM COMPLETE Result Date: 04/26/2024    ECHOCARDIOGRAM REPORT   Patient Name:   RHONDALYN CLINGAN Date of Exam: 04/26/2024 Medical Rec #:  [id number]          Height:       64.0 in Accession #:    [id number]         Weight:       170.3 lb Date of Birth:  08/14/1960          BSA:          1.827 m Patient Age:    63 years           BP:           121/67 mmHg Patient Gender: F                  HR:           54 bpm. Exam Location:  [facility] Procedure: 2D Echo, 3D Echo, Cardiac Doppler, Color Doppler and Strain Analysis            (Both Spectral and Color Flow Doppler were utilized during            procedure). Indications:     Chemo Z09  History:         Patient has no prior history of Echocardiogram examinations.  Sonographer:     Thea Norlander RCS Referring Phys:  [id number] Bentleigh Stankus Diagnosing Phys: Evalene Lunger MD IMPRESSIONS  1. Left ventricular ejection fraction, by estimation, is 55 to 60%. The left ventricle has normal function. The left ventricle has no regional wall motion abnormalities. Left ventricular diastolic parameters are consistent with Grade I diastolic dysfunction (impaired relaxation). The average left ventricular global longitudinal strain is -19.7 %. The global longitudinal strain is normal.   2. Right ventricular systolic function is normal. The right ventricular size is normal.  3. The mitral valve is normal in structure. Mild mitral valve regurgitation. No evidence of mitral stenosis.  4. The aortic valve is normal in structure. Aortic valve regurgitation is not visualized. No aortic stenosis is present.  5. The inferior vena cava is normal in size with greater than 50% respiratory variability, suggesting right atrial pressure of 3 mmHg. FINDINGS  Left Ventricle: Left ventricular ejection fraction, by estimation, is 55 to 60%. The left ventricle has normal function. The left ventricle has no regional wall motion abnormalities. The average left ventricular global longitudinal strain is -19.7 %. Strain was performed and the global longitudinal strain is normal. The left ventricular internal cavity size was normal in size. There is no left ventricular hypertrophy. Left ventricular diastolic parameters are consistent with Grade I diastolic dysfunction (impaired  relaxation). Right Ventricle: The right ventricular size is normal. No increase in right ventricular wall thickness. Right ventricular systolic function is normal. Left Atrium: Left atrial size was normal in size. Right Atrium: Right atrial size was normal in size. Pericardium: There is no evidence of pericardial effusion. Mitral Valve: The mitral valve is normal in structure. Mild mitral valve regurgitation. No evidence of mitral valve stenosis. Tricuspid Valve: The tricuspid valve is normal in structure. Tricuspid valve regurgitation is trivial. No evidence of tricuspid stenosis. Aortic Valve: The aortic valve is normal in structure. Aortic valve regurgitation is not visualized. No aortic stenosis is present. Aortic valve peak gradient measures 5.2 mmHg. Pulmonic Valve: The pulmonic valve was normal in structure. Pulmonic valve regurgitation is not visualized. No evidence of pulmonic stenosis. Aorta: The aortic root is normal in size and  structure. Venous: The inferior vena cava is normal in size with greater than 50% respiratory variability, suggesting right atrial pressure of 3 mmHg. IAS/Shunts: No atrial level shunt detected by color flow Doppler. Additional Comments: 3D was performed not requiring image post processing on an independent workstation and was indeterminate.  LEFT VENTRICLE PLAX 2D LVIDd:         4.10 cm   Diastology LVIDs:         3.10 cm   LV e' medial:  6.96 cm/s LV PW:         0.80 cm   LV e' lateral: 9.14 cm/s LV IVS:        0.70 cm LVOT diam:     2.40 cm   2D Longitudinal Strain LV SV:         79        2D Strain GLS Avg:     -19.7 % LV SV Index:   43 LVOT Area:     4.52 cm                           3D Volume EF:                          3D EF:        58 %                          LV EDV:       129 ml                          LV ESV:       55 ml                          LV SV:        74 ml RIGHT VENTRICLE             IVC RV S prime:     13.20 cm/s  IVC diam: 1.20 cm TAPSE (M-mode): 2.3 cm LEFT ATRIUM             Index        RIGHT ATRIUM           Index LA diam:        3.30 cm 1.81 cm/m   RA Area:     13.40 cm LA Vol (A2C):   35.3 ml 19.32 ml/m  RA Volume:   27.80 ml  15.21 ml/m LA Vol (A4C):  20.4 ml 11.16 ml/m LA Biplane Vol: 28.9 ml 15.82 ml/m  AORTIC VALVE AV Area (Vmax): 3.59 cm AV Vmax:        114.00 cm/s AV Peak Grad:   5.2 mmHg LVOT Vmax:      90.40 cm/s LVOT Vmean:     56.100 cm/s LVOT VTI:       0.175 m  AORTA Ao Root diam: 3.10 cm Ao Asc diam:  3.30 cm TRICUSPID VALVE TR Peak grad:   7.7 mmHg TR Vmax:        139.00 cm/s  SHUNTS Systemic VTI:  0.18 m Systemic Diam: 2.40 cm Evalene Lunger MD Electronically signed by Evalene Lunger MD Signature Date/Time: 04/26/2024/2:00:19 PM    Final    NM PET Image Initial (PI) Skull Base To Thigh (F-18 FDG) Result Date: 04/25/2024 CLINICAL DATA:  Initial treatment strategy for new diagnosis of lymphoma. Known left pelvic soft tissue mass. EXAM: NUCLEAR MEDICINE PET  SKULL BASE TO THIGH TECHNIQUE: 9.4 mCi F-18 FDG was injected intravenously. Full-ring PET imaging was performed from the skull base to thigh after the radiotracer. CT data was obtained and used for attenuation correction and anatomic localization. Fasting blood glucose: 86 mg/dl COMPARISON:  Abdominopelvic CTA and CT of 04/08/2024 FINDINGS: Mediastinal blood pool activity: SUV max 3.5 Liver activity: SUV max 4.0 NECK: Extensive hypermetabolic brown fat throughout the neck and upper chest. Given this limitation, no cervical nodal hypermetabolism identified. Incidental CT findings: Left carotid atherosclerosis. No cervical adenopathy. CHEST: No pulmonary parenchymal or thoracic nodal hypermetabolism. Incidental CT findings: Aortic and coronary artery calcification. Tiny right pleural effusion is new since the prior CTs. ABDOMEN/PELVIS: Mild splenic hypermetabolism relative to the liver (SUV 4.6). No splenomegaly. The posterior left pelvic sidewall mass on prior CTA is hypermetabolic. Difficult to measure on noncontrast CT, estimated at 4.5 x 3.4 cm and a S.U.V. max of 14.1 on 127/6. No separate areas of abdominopelvic nodal hypermetabolism. Incidental CT findings: Left ureteric stent in place, without significant hydronephrosis. SKELETON: No abnormal marrow activity. Incidental CT findings: Mild degenerative changes of both hips. Bilateral shoulder arthroplasties. IMPRESSION: 1. The posterior left pelvic sidewall mass is hypermetabolic, consistent with active lymphoma. 2. Mild splenic hypermetabolism relative to the liver. No splenomegaly. Cannot exclude lymphomatous involvement. 3. New small right pleural effusion. 4. No active lymphoma within the chest or neck. Mildly decreased sensitivity exam secondary to hypermetabolic brown fat. 5. Age advanced coronary artery atherosclerosis. Recommend assessment of coronary risk factors. 6.  Aortic Atherosclerosis (ICD10-I70.0). Electronically Signed   By: Rockey Kilts  M.D.   On: 04/25/2024 08:59   CT ABDOMINAL MASS BIOPSY Result Date: 04/10/2024 INDICATION: 63 year old female with a newly diagnosed infiltrative mass along the left pelvic sidewall. EXAM: CT-guided core biopsy of deep soft tissue mass MEDICATIONS: None. ANESTHESIA/SEDATION: Moderate (conscious) sedation was employed during this procedure. A total of Versed  1 mg and Fentanyl  50 mcg was administered intravenously by the radiology nurse. Total intra-service moderate Sedation Time: 10 minutes. The patient's level of consciousness and vital signs were monitored continuously by radiology nursing throughout the procedure under my direct supervision. COMPLICATIONS: None immediate. PROCEDURE: Informed written consent was obtained from the patient after a thorough discussion of the procedural risks, benefits and alternatives. All questions were addressed. Maximal Sterile Barrier Technique was utilized including caps, mask, sterile gowns, sterile gloves, sterile drape, hand hygiene and skin antiseptic. A timeout was performed prior to the initiation of the procedure. A CT scan was performed identifying the ill-defined mass along  the left pelvic sidewall. A suitable skin entry site was selected and marked. The skin was sterilely prepped and draped in standard fashion using chlorhexidine  skin prep. Local anesthesia was attained by infiltration with 1% lidocaine . A small dermatotomy was made. A 17 gauge introducer needle was carefully advanced through the gluteal musculature and into the margin of the mass. Needle positioning was cross reference with the prior CTA of the abdomen and pelvis to ensure trajectory of the biopsy passes with not cross the internal iliac vessels. Several 18 gauge core biopsies were then obtained using the bio Pince automated biopsy device. Biopsies were placed in saline and delivered to pathology for further analysis. Post biopsy CT imaging demonstrates no evidence of immediate complication.  IMPRESSION: CT-guided core biopsy of infiltrative left pelvic sidewall mass. Electronically Signed   By: Wilkie Lent M.D.   On: 04/10/2024 13:00   DG OR UROLOGY CYSTO IMAGE ([facility] ONLY) Result Date: 04/08/2024 There is no interpretation for this exam.  This order is for images obtained during a surgical procedure.  Please See Surgeries Tab for more information regarding the procedure.   CT Angio Abd/Pel W and/or Wo Contrast Result Date: 04/08/2024 EXAM: CTA ABDOMEN AND PELVIS WITH AND WITHOUT CONTRAST 04/08/2024 07:28:48 AM TECHNIQUE: CTA images of the abdomen and pelvis without and with intravenous contrast. Three-dimensional MIP/volume rendered formations were performed. Automated exposure control, iterative reconstruction, and/or weight based adjustment of the mA/kV was utilized to reduce the radiation dose to as low as reasonably achievable. 100 mL iohexol (OMNIPAQUE) 350 MG/ML injection was administered. COMPARISON: CT from earlier today. CLINICAL HISTORY: Mesenteric ischemia, acute; left back, flank LLQ pain, assess for AAS. Left flank pain radiating to stomach. Constipation. FINDINGS: VASCULATURE: No acute finding. No occlusion or significant stenosis. AORTA: No acute finding. No abdominal aortic aneurysm. No dissection. CELIAC TRUNK: No acute finding. No occlusion or significant stenosis. SUPERIOR MESENTERIC ARTERY: No acute finding. No occlusion or significant stenosis. RENAL ARTERIES: No acute finding. No occlusion or significant stenosis. ILIAC ARTERIES: No acute finding. No occlusion or significant stenosis. LIVER: The liver is unremarkable. GALLBLADDER AND BILE DUCTS: Gallbladder is unremarkable. No biliary ductal dilatation. SPLEEN: The spleen is unremarkable. PANCREAS: The pancreas is unremarkable. ADRENAL GLANDS: Bilateral adrenal glands demonstrate no acute abnormality. KIDNEYS, URETERS AND BLADDER: Left-sided nephromegaly, hydronephrosis with delayed nephrograms. Left-sided hydroureter  extends into the pelvis and is obstructed distally by a left pelvic sidewall enhancing mass. The mass measures 5.9 x 4.5 cm (image 164/9). The mass abuts the left adnexa (image 166/9) with loss of a fat plane between these 2 structures. No stones in the kidneys or ureters. No perinephric or periureteral stranding. Urinary bladder is unremarkable. GI AND BOWEL: Stomach appears nondilated. No pathologic dilatation of the large or small bowel loops. Moderate retained stool within the ascending colon. There are no secondary signs of bowel ischemia. REPRODUCTIVE: Uterus appears normal. Right adnexa is within normal limits. The left adnexa is abutts the left pelvic sidewall enhancing mass as described under KIDNEYS, URETERS AND BLADDER. PERITONEUM AND RETRPERITONEUM: No ascites or free air. No focal fluid collections. LUNG BASE: No acute abnormality. LYMPH NODES: No lymphadenopathy. BONES AND SOFT TISSUES: No acute abnormality of the bones. No aggressive lytic or sclerotic bone lesion. No acute soft tissue abnormality. IMPRESSION: 1. Obstructing 5.9 x 4.5 cm enhancing mass along the left pelvic sidewall causing left hydroureter, hydronephrosis, delayed nephrogram, and nephromegaly; mass abuts the left adnexa with loss of the intervening fat plane. This is highly concerning  for malignant neoplasm. Etiology indeterminate. It's possible that this could be arising from a primary urothelial neoplasm of the left ureter. Other primary neoplasms or metastatic disease not excluded. Correlation with tissue sampling is advised. 2. Patent abdominal vasculature without signs of mesenteric ischemia. Electronically signed by: Waddell Calk MD 04/08/2024 07:40 AM EDT RP Workstation: [id number]   CT Renal Stone Study Result Date: 04/08/2024 EXAM: CT UROGRAM 04/08/2024 02:22:00 AM TECHNIQUE: CT of the abdomen and pelvis was performed without the administration of intravenous contrast as per CT urogram protocol. Multiplanar reformatted  images as well as MIP urogram images are provided for review. Automated exposure control, iterative reconstruction, and/or weight based adjustment of the mA/kV was utilized to reduce the radiation dose to as low as reasonably achievable. COMPARISON: None available. CLINICAL HISTORY: Abdominal/flank pain, stone suspected. POV from home for left flank pain radiating into left lower abdomen x 3 days and constipation since Sunday. Denies dysuria or hematuria. FINDINGS: LOWER CHEST: No acute abnormality. LIVER: The liver is unremarkable. GALLBLADDER AND BILE DUCTS: Gallbladder is unremarkable. No biliary ductal dilatation. SPLEEN: No acute abnormality. PANCREAS: No acute abnormality. ADRENAL GLANDS: No acute abnormality. KIDNEYS, URETERS AND BLADDER: Mild left hydroureteronephrosis. Hazy stranding about the left renal pelvis and proximal left ureter. No urinary calculi. Differential considerations include recently passed left stone or left pyelonephritis. Urinary bladder is unremarkable. GI AND BOWEL: Stomach demonstrates no acute abnormality. There is no bowel obstruction. PERITONEUM AND RETROPERITONEUM: No ascites. No free air. VASCULATURE: Aorta is normal in caliber. LYMPH NODES: No lymphadenopathy. REPRODUCTIVE ORGANS: No acute abnormality. BONES AND SOFT TISSUES: No acute osseous abnormality. No focal soft tissue abnormality. IMPRESSION: 1. Mild left hydroureteronephrosis with periureteral/peripelvic stranding, no urinary calculi. 2. Imaging findings are compatible with recently passed left ureteral stone versus acute left pyelonephritis. Electronically signed by: Norman Gatlin MD 04/08/2024 03:20 AM EDT RP Workstation: [id number]   US  Venous Img Lower Unilateral Left Result Date: 03/30/2024 CLINICAL DATA:  Left lower extremity swelling. EXAM: Left LOWER EXTREMITY VENOUS DOPPLER ULTRASOUND TECHNIQUE: Gray-scale sonography with compression, as well as color and duplex ultrasound, were performed to evaluate the  deep venous system(s) from the level of the common femoral vein through the popliteal and proximal calf veins. COMPARISON:  None Available. FINDINGS: VENOUS Normal compressibility of the common femoral, superficial femoral, and popliteal veins, as well as the visualized calf veins. Visualized portions of profunda femoral vein and great saphenous vein unremarkable. No filling defects to suggest DVT on grayscale or color Doppler imaging. Doppler waveforms show normal direction of venous flow, normal respiratory plasticity and response to augmentation. Limited views of the contralateral common femoral vein are unremarkable. OTHER None. Limitations: none IMPRESSION: Negative. Electronically Signed   By: Vanetta Chou M.D.   On: 03/30/2024 19:15     Assessment and plan-   # Sepsis, in the context of chemotherapy induced neutropenia. Patient has received long-acting G-CSF after cycle 1 day 1 chemotherapy.  No need for short acting G-CSF.  Anticipate ANC to recover in the next few days.  Consider prophylactic antibiotics during future cycles of chemotherapy.  Tmax 100.1.  Continue IV antibiotics. Follow-up urine culture and blood culture. Appreciate urology recommendation of stent management Continue oxybutynin and tamsulosin  CT abdomen pelvis showed decrease of pelvic mass, indicating good response to treatment.  Rim-enhancing low attenuating focus within the soft tissue mass, suspect due to tumor central necrosis.  # Small age-indeterminate pulmonary embolism.  Agree with anticoagulation.  Can switch to Eliquis at discharge.  #  Thrombocytopenia, chemotherapy related.  Monitor. # Sciatic pain/neuropathic pain.  Recommend to increase gabapentin  to 300 mg twice daily.  Continue current pain regimen.  Thank you for allowing me to participate in the care of this patient.   Zelphia Cap, MD, PhD Hematology Oncology 05/18/2024

## 2024-05-18 NOTE — Progress Notes (Signed)
 PHARMACY - ANTICOAGULATION CONSULT NOTE  Pharmacy Consult for Heparin  Indication: pulmonary embolus  Allergies  Allergen Reactions   Propofol  Nausea Only    Patient states medication can be tolerated with an antiemetic before taking    Patient Measurements: Height: 5' 4 (162.6 cm) Weight: 72.6 kg (160 lb) IBW/kg (Calculated) : 54.7 HEPARIN DW (KG): 69.6  Vital Signs: Temp: 98.4 F (36.9 C) (11/06 0426) Temp Source: Oral (11/06 0426) BP: 151/80 (11/06 0426) Pulse Rate: 81 (11/06 0426)  Labs: Recent Labs    05/17/24 1613 05/17/24 2136 05/18/24 0425  HGB 12.5  --  10.6*  HCT 35.6*  --  31.1*  PLT 58*  --  43*  APTT  --  26  --   LABPROT 12.9  --   --   INR 0.9  --   --   HEPARINUNFRC  --   --  0.44  CREATININE 0.43*  --   --   TROPONINIHS 4  --   --     Estimated Creatinine Clearance: 72.2 mL/min (A) (by C-G formula based on SCr of 0.43 mg/dL (L)).   Medical History: Past Medical History:  Diagnosis Date   Arthritis    left shoulder, neck, lower back  (01/11/2018)   Chronic lower back pain    Family history of adverse reaction to anesthesia    Mother has nausea   GERD (gastroesophageal reflux disease)    PONV (postoperative nausea and vomiting)    Pre-diabetes    UTI (urinary tract infection) 03/20/2021    Medications:  Medications Prior to Admission  Medication Sig Dispense Refill Last Dose/Taking   acetaminophen  (TYLENOL ) 500 MG tablet Take 500 mg by mouth every 6 (six) hours as needed for mild pain (pain score 1-3).   05/17/2024   acyclovir (ZOVIRAX) 400 MG tablet Take 1 tablet (400 mg total) by mouth daily. 30 tablet 3 05/17/2024   allopurinol (ZYLOPRIM) 300 MG tablet Take 1 tablet (300 mg total) by mouth daily. 30 tablet 3 05/17/2024   fentaNYL  (DURAGESIC ) 25 MCG/HR Place 1 patch onto the skin every 3 (three) days. 5 patch 0 Taking   gabapentin  (NEURONTIN ) 100 MG capsule Take 1 capsule (100 mg total) by mouth 3 (three) times daily. 90 capsule 0  05/17/2024   lidocaine -prilocaine (EMLA) cream Apply to affected area once 30 g 3 Unknown   loratadine (CLARITIN) 10 MG tablet Take 10 mg by mouth at bedtime.   05/16/2024 Bedtime   magic mouthwash (multi-ingredient) oral suspension Swish and swallow 5-10 mLs 4 (four) times daily as needed for mouth pain. 480 mL 1 Unknown   methocarbamol  (ROBAXIN ) 500 MG tablet Take 500 mg by mouth every 8 (eight) hours as needed for muscle spasms.  0 Unknown   omeprazole  (PRILOSEC) 20 MG capsule TAKE 1 CAPSULE (20 MG TOTAL) BY MOUTH 2 (TWO) TIMES DAILY BEFORE A MEAL. 180 capsule 0 Unknown   ondansetron  (ZOFRAN ) 8 MG tablet Take 1 tablet (8 mg total) by mouth every 8 (eight) hours as needed for nausea or vomiting. Start on the third day after cyclophosphamide chemotherapy. 30 tablet 1 Unknown   oxybutynin (DITROPAN-XL) 10 MG 24 hr tablet Take 1 tablet (10 mg total) by mouth daily. 30 tablet 0 05/17/2024   oxyCODONE  (OXY IR/ROXICODONE ) 5 MG immediate release tablet Take 1 tablet (5 mg total) by mouth every 4 (four) hours as needed. 90 tablet 0 Unknown   predniSONE  (DELTASONE ) 20 MG tablet Take 5 tablets (100 mg total) by mouth daily.  Take with food on days 2-5 of chemotherapy. 20 tablet 5 Past Month   prochlorperazine (COMPAZINE) 10 MG tablet Take 1 tablet (10 mg total) by mouth every 6 (six) hours as needed for nausea or vomiting. 30 tablet 6 Unknown   sodium phosphate  (FLEET) ENEM Place 133 mLs (1 enema total) rectally daily as needed for severe constipation. 3 mL 0 Unknown   tamsulosin (FLOMAX) 0.4 MG CAPS capsule Take 1 capsule (0.4 mg total) by mouth daily. 30 capsule 0 05/17/2024    Assessment: Pharmacy consulted to dose heparin in this 63 year old female admitted with PE.  No prior anticoag noted. CrCl = 72.2 ml/min   Goal of Therapy:  Heparin level 0.3-0.7 units/ml Monitor platelets by anticoagulation protocol: Yes   Plan:  11/6:  HL @ 0425 = 0.44, therapeutic X 1 - will continue pt on current rate and  recheck HL in 6 hrs - CBC daily   Analys Ryden D 05/18/2024,5:39 AM

## 2024-05-18 NOTE — Progress Notes (Addendum)
 PHARMACY - ANTICOAGULATION CONSULT NOTE  Pharmacy Consult for Heparin  Indication: pulmonary embolus  Allergies  Allergen Reactions   Propofol  Nausea Only    Patient states medication can be tolerated with an antiemetic before taking    Patient Measurements: Height: 5' 4 (162.6 cm) Weight: 72.6 kg (160 lb) IBW/kg (Calculated) : 54.7 HEPARIN DW (KG): 69.6  Vital Signs: Temp: 98.7 F (37.1 C) (11/06 0830) Temp Source: Axillary (11/06 0830) BP: 176/69 (11/06 0830) Pulse Rate: 76 (11/06 0830)  Labs: Recent Labs    05/17/24 1613 05/17/24 2136 05/18/24 0425 05/18/24 1012  HGB 12.5  --  10.6*  --   HCT 35.6*  --  31.1*  --   PLT 58*  --  43*  --   APTT  --  26  --   --   LABPROT 12.9  --   --   --   INR 0.9  --   --   --   HEPARINUNFRC  --   --  0.44 0.26*  CREATININE 0.43*  --  0.44  --   TROPONINIHS 4  --   --   --     Estimated Creatinine Clearance: 72.2 mL/min (by C-G formula based on SCr of 0.44 mg/dL).   Medical History: Past Medical History:  Diagnosis Date   Arthritis    left shoulder, neck, lower back  (01/11/2018)   Chronic lower back pain    Family history of adverse reaction to anesthesia    Mother has nausea   GERD (gastroesophageal reflux disease)    PONV (postoperative nausea and vomiting)    Pre-diabetes    UTI (urinary tract infection) 03/20/2021    Medications:  Medications Prior to Admission  Medication Sig Dispense Refill Last Dose/Taking   acetaminophen  (TYLENOL ) 500 MG tablet Take 500 mg by mouth every 6 (six) hours as needed for mild pain (pain score 1-3).   05/17/2024   acyclovir (ZOVIRAX) 400 MG tablet Take 1 tablet (400 mg total) by mouth daily. 30 tablet 3 05/17/2024   allopurinol (ZYLOPRIM) 300 MG tablet Take 1 tablet (300 mg total) by mouth daily. 30 tablet 3 05/17/2024   fentaNYL  (DURAGESIC ) 25 MCG/HR Place 1 patch onto the skin every 3 (three) days. 5 patch 0 Taking   gabapentin  (NEURONTIN ) 100 MG capsule Take 1 capsule (100 mg  total) by mouth 3 (three) times daily. 90 capsule 0 05/17/2024   lidocaine -prilocaine (EMLA) cream Apply to affected area once 30 g 3 Unknown   loratadine (CLARITIN) 10 MG tablet Take 10 mg by mouth at bedtime.   05/16/2024 Bedtime   magic mouthwash (multi-ingredient) oral suspension Swish and swallow 5-10 mLs 4 (four) times daily as needed for mouth pain. 480 mL 1 Unknown   methocarbamol  (ROBAXIN ) 500 MG tablet Take 500 mg by mouth every 8 (eight) hours as needed for muscle spasms.  0 Unknown   omeprazole  (PRILOSEC) 20 MG capsule TAKE 1 CAPSULE (20 MG TOTAL) BY MOUTH 2 (TWO) TIMES DAILY BEFORE A MEAL. 180 capsule 0 Unknown   ondansetron  (ZOFRAN ) 8 MG tablet Take 1 tablet (8 mg total) by mouth every 8 (eight) hours as needed for nausea or vomiting. Start on the third day after cyclophosphamide chemotherapy. 30 tablet 1 Unknown   oxybutynin (DITROPAN-XL) 10 MG 24 hr tablet Take 1 tablet (10 mg total) by mouth daily. 30 tablet 0 05/17/2024   oxyCODONE  (OXY IR/ROXICODONE ) 5 MG immediate release tablet Take 1 tablet (5 mg total) by mouth every 4 (four)  hours as needed. 90 tablet 0 Unknown   predniSONE  (DELTASONE ) 20 MG tablet Take 5 tablets (100 mg total) by mouth daily. Take with food on days 2-5 of chemotherapy. 20 tablet 5 Past Month   prochlorperazine (COMPAZINE) 10 MG tablet Take 1 tablet (10 mg total) by mouth every 6 (six) hours as needed for nausea or vomiting. 30 tablet 6 Unknown   sodium phosphate  (FLEET) ENEM Place 133 mLs (1 enema total) rectally daily as needed for severe constipation. 3 mL 0 Unknown   tamsulosin (FLOMAX) 0.4 MG CAPS capsule Take 1 capsule (0.4 mg total) by mouth daily. 30 capsule 0 05/17/2024    Assessment: Pharmacy consulted to dose heparin in this 63 year old female admitted with PE.  No prior anticoag noted. CrCl = 72.2 ml/min   CT Abd Pelvis: Tiny nonocclusive embolus in the right lower lobe subsegmental branch.   11/6 @ 0425 HL 0.44- Therapeutic x 1- heparin @ 1150  units/hr 11/6 @ 1012 HL 0.26 -subtherapeutic  Goal of Therapy:  Heparin level 0.3-0.7 units/ml Monitor platelets by anticoagulation protocol: Yes   Plan:  11/6 @ 1012 HL 0.26 subtherapeutic -After discussion with provider- will skip bolus since Plts <50  - Increase infusion to 1250 units/hr  - Recheck HL 6 hrs after rate change  - CBC daily   Estill CHRISTELLA Lutes, PharmD, BCPS Clinical Pharmacist 05/18/2024 11:44 AM

## 2024-05-18 NOTE — Consult Note (Signed)
 Urology Consult  I have been asked to see the patient by Dr. Tobie, for evaluation and management of sepsis 2/2 complicated UTI, possible abscess.  Chief Complaint: Dysuria, suprapubic pain, low back pain, fever  History of Present Illness: Stacey [name] is a 63 y.o. year old female with PMH diffuse large B cell lymphoma on chemo per Dr. Babara with malignant left hydronephrosis s/p left ureteral stent placement with Dr. Watt on 04/08/2024 admitted on 05/17/2024 with febrile neutropenia and sepsis due to UTI.  CTAP with contrast on presentation notable for interval improvement without resolution of left hydronephrosis with left ureteral stent in appropriate position.  Persistent mild hydronephrosis believed consistent with ascending UTI.  There is also been interval improvement in her left pelvic mass, though now contains a rim-enhancing focus suggestive of phlegmon versus abscess versus tumor necrosis.  Dr. Babara has reviewed the imaging and feels that tumor necrosis is most likely.  Admission labs notable for UA with >50 RBC/hpf and 21-50 WBC/hpf; lactate 1.7; creatinine 0.43; and white count 0.1.  Urine and blood cultures pending, on antibiotics as below.  She reports sudden onset dysuria and suprapubic/low back pain 2 days ago followed by fever.  She has been having dysuria, urgency, and the sensation of incomplete bladder emptying since stent placement, but this has been rather responsive to Flomax and oxybutynin.  The dysuria is much worse since arrival.  Overall she feels stable to slightly improved today compared to on admission.  She has been afebrile, VSS.  Anti-infectives (From admission, onward)    Start     Dose/Rate Route Frequency Ordered Stop   05/18/24 2200  meropenem (MERREM) 1 g in sodium chloride  0.9 % 100 mL IVPB        1 g 200 mL/hr over 30 Minutes Intravenous Every 8 hours 05/18/24 2113     05/18/24 1000  acyclovir (ZOVIRAX) 200 MG capsule 400 mg        400 mg Oral  Daily 05/18/24 0225     05/17/24 2200  ceFEPIme (MAXIPIME) 2 g in sodium chloride  0.9 % 100 mL IVPB  Status:  Discontinued        2 g 200 mL/hr over 30 Minutes Intravenous Every 8 hours 05/17/24 1953 05/18/24 2113   05/17/24 2130  ceFEPIme (MAXIPIME) 2 g in sodium chloride  0.9 % 100 mL IVPB  Status:  Discontinued        2 g 200 mL/hr over 30 Minutes Intravenous  Once 05/17/24 2043 05/17/24 2045   05/17/24 1615  cefTRIAXone (ROCEPHIN) 2 g in sodium chloride  0.9 % 100 mL IVPB        2 g 200 mL/hr over 30 Minutes Intravenous  Once 05/17/24 1602 05/17/24 1737        Past Medical History:  Diagnosis Date   Arthritis    left shoulder, neck, lower back  (01/11/2018)   Chronic lower back pain    Family history of adverse reaction to anesthesia    Mother has nausea   GERD (gastroesophageal reflux disease)    PONV (postoperative nausea and vomiting)    Pre-diabetes    UTI (urinary tract infection) 03/20/2021    Past Surgical History:  Procedure Laterality Date   APPENDECTOMY     CYSTOSCOPY W/ URETERAL STENT PLACEMENT Left 04/08/2024   Procedure: CYSTOSCOPY, WITH RETROGRADE PYELOGRAM AND URETERAL STENT INSERTION;  Surgeon: Watt Rush, MD;  Location: [facility] ORS;  Service: Urology;  Laterality: Left;   IR IMAGING GUIDED PORT INSERTION  05/01/2024   JOINT REPLACEMENT     TOTAL SHOULDER ARTHROPLASTY Left 01/11/2018   TOTAL SHOULDER ARTHROPLASTY Left 01/11/2018   Procedure: LEFT TOTAL SHOULDER ARTHROPLASTY;  Surgeon: Sharl Selinda Dover, MD;  Location: [facility] OR;  Service: Orthopedics;  Laterality: Left;  2.5 hrs   TOTAL SHOULDER ARTHROPLASTY Right 02/08/2020   Procedure: TOTAL SHOULDER ARTHROPLASTY;  Surgeon: Sharl Selinda Dover, MD;  Location: [facility] OR;  Service: Orthopedics;  Laterality: Right;  2.5 hrs RNFA    Home Medications:  Current Meds  Medication Sig   acetaminophen  (TYLENOL ) 500 MG tablet Take 500 mg by mouth every 6 (six) hours as needed for mild pain (pain score 1-3).    acyclovir (ZOVIRAX) 400 MG tablet Take 1 tablet (400 mg total) by mouth daily.   allopurinol (ZYLOPRIM) 300 MG tablet Take 1 tablet (300 mg total) by mouth daily.   fentaNYL  (DURAGESIC ) 25 MCG/HR Place 1 patch onto the skin every 3 (three) days.   gabapentin  (NEURONTIN ) 100 MG capsule Take 1 capsule (100 mg total) by mouth 3 (three) times daily.   lidocaine -prilocaine (EMLA) cream Apply to affected area once   loratadine (CLARITIN) 10 MG tablet Take 10 mg by mouth at bedtime.   magic mouthwash (multi-ingredient) oral suspension Swish and swallow 5-10 mLs 4 (four) times daily as needed for mouth pain.   methocarbamol  (ROBAXIN ) 500 MG tablet Take 500 mg by mouth every 8 (eight) hours as needed for muscle spasms.   omeprazole  (PRILOSEC) 20 MG capsule TAKE 1 CAPSULE (20 MG TOTAL) BY MOUTH 2 (TWO) TIMES DAILY BEFORE A MEAL.   ondansetron  (ZOFRAN ) 8 MG tablet Take 1 tablet (8 mg total) by mouth every 8 (eight) hours as needed for nausea or vomiting. Start on the third day after cyclophosphamide chemotherapy.   oxybutynin (DITROPAN-XL) 10 MG 24 hr tablet Take 1 tablet (10 mg total) by mouth daily.   oxyCODONE  (OXY IR/ROXICODONE ) 5 MG immediate release tablet Take 1 tablet (5 mg total) by mouth every 4 (four) hours as needed.   predniSONE  (DELTASONE ) 20 MG tablet Take 5 tablets (100 mg total) by mouth daily. Take with food on days 2-5 of chemotherapy.   prochlorperazine (COMPAZINE) 10 MG tablet Take 1 tablet (10 mg total) by mouth every 6 (six) hours as needed for nausea or vomiting.   sodium phosphate  (FLEET) ENEM Place 133 mLs (1 enema total) rectally daily as needed for severe constipation.   tamsulosin (FLOMAX) 0.4 MG CAPS capsule Take 1 capsule (0.4 mg total) by mouth daily.    Allergies:  Allergies  Allergen Reactions   Propofol  Nausea Only    Patient states medication can be tolerated with an antiemetic before taking    Family History  Problem Relation Age of Onset   Hypertension Mother     Heart disease Father    Heart attack Father    CAD Father    Colon cancer Neg Hx    Esophageal cancer Neg Hx    Rectal cancer Neg Hx    Stomach cancer Neg Hx     Social History:  reports that she has never smoked. She has never used smokeless tobacco. She reports that she does not currently use alcohol. She reports that she does not use drugs.  ROS: A complete review of systems was performed.  All systems are negative except for pertinent findings as noted.  Physical Exam:  Vital signs in last 24 hours: Temp:  [98.2 F (36.8 C)-99.3 F (37.4 C)] 99 F (37.2 C) (11/07  0813) Pulse Rate:  [80-88] 84 (11/07 0813) Resp:  [16-20] 20 (11/07 0813) BP: (118-158)/(58-73) 155/65 (11/07 0813) SpO2:  [96 %-99 %] 98 % (11/07 0813) Constitutional:  Alert and oriented, no acute distress HEENT: [place] AT, moist mucus membranes Cardiovascular: No clubbing, cyanosis, or edema Respiratory: Normal respiratory effort Skin: No rashes, bruises or suspicious lesions Neurologic: Grossly intact, no focal deficits, moving all 4 extremities Psychiatric: Normal mood and affect  Laboratory Data:  Recent Labs    05/17/24 1613 05/18/24 0425 05/19/24 0130  WBC 0.1* 0.3* 0.2*  HGB 12.5 10.6* 9.1*  HCT 35.6* 31.1* 26.7*   Recent Labs    05/17/24 1613 05/18/24 0425  NA 130* 134*  K 4.0 3.6  CL 95* 101  CO2 24 27  GLUCOSE 154* 129*  BUN 12 10  CREATININE 0.43* 0.44  CALCIUM 9.0 8.4*   Recent Labs    05/17/24 1613  INR 0.9   Urinalysis    Component Value Date/Time   COLORURINE YELLOW (A) 05/17/2024 1558   APPEARANCEUR HAZY (A) 05/17/2024 1558   APPEARANCEUR Clear 09/18/2012 1203   LABSPEC 1.016 05/17/2024 1558   LABSPEC 1.013 09/18/2012 1203   PHURINE 8.0 05/17/2024 1558   GLUCOSEU NEGATIVE 05/17/2024 1558   GLUCOSEU Negative 09/18/2012 1203   HGBUR LARGE (A) 05/17/2024 1558   BILIRUBINUR NEGATIVE 05/17/2024 1558   BILIRUBINUR Negative 09/18/2012 1203   KETONESUR 80 (A) 05/17/2024 1558    PROTEINUR 100 (A) 05/17/2024 1558   NITRITE NEGATIVE 05/17/2024 1558   LEUKOCYTESUR NEGATIVE 05/17/2024 1558   LEUKOCYTESUR 1+ 09/18/2012 1203   Results for orders placed or performed during the hospital encounter of 05/17/24  Urine Culture     Status: None (Preliminary result)   Collection Time: 05/17/24  3:58 PM   Specimen: Urine, Clean Catch  Result Value Ref Range Status   Specimen Description   Final    URINE, CLEAN CATCH Performed at [facility], [address] [place], [place] [address]    Special Requests   Final    NONE Reflexed from [id number] Performed at [facility], [address] [place] [place], [place] [address]    Culture PENDING  Incomplete   Report Status PENDING  Incomplete  Blood Culture (routine x 2)     Status: None (Preliminary result)   Collection Time: 05/17/24  4:13 PM   Specimen: BLOOD  Result Value Ref Range Status   Specimen Description BLOOD BLOOD RIGHT FOREARM  Final   Special Requests   Final    BOTTLES DRAWN AEROBIC AND ANAEROBIC Blood Culture results may not be optimal due to an inadequate volume of blood received in culture bottles   Culture   Final    NO GROWTH 2 DAYS Performed at [facility], [address] [place], [place] [address]    Report Status PENDING  Incomplete  Blood Culture (routine x 2)     Status: None (Preliminary result)   Collection Time: 05/17/24  4:13 PM   Specimen: BLOOD  Result Value Ref Range Status   Specimen Description BLOOD LEFT ANTECUBITAL  Final   Special Requests   Final    BOTTLES DRAWN AEROBIC AND ANAEROBIC Blood Culture results may not be optimal due to an inadequate volume of blood received in culture bottles   Culture   Final    NO GROWTH 2 DAYS Performed at [facility], [address], [place], [place] [address]    Report Status PENDING  Incomplete  Resp panel  by RT-PCR (RSV, Flu A&B, Covid) Anterior Nasal Swab     Status: None   Collection Time: 05/17/24  8:09 PM    Specimen: Anterior Nasal Swab  Result Value Ref Range Status   SARS Coronavirus 2 by RT PCR NEGATIVE NEGATIVE Final    Comment: (NOTE) SARS-CoV-2 target nucleic acids are NOT DETECTED.  The SARS-CoV-2 RNA is generally detectable in upper respiratory specimens during the acute phase of infection. The lowest concentration of SARS-CoV-2 viral copies this assay can detect is 138 copies/mL. A negative result does not preclude SARS-Cov-2 infection and should not be used as the sole basis for treatment or other patient management decisions. A negative result may occur with  improper specimen collection/handling, submission of specimen other than nasopharyngeal swab, presence of viral mutation(s) within the areas targeted by this assay, and inadequate number of viral copies(<138 copies/mL). A negative result must be combined with clinical observations, patient history, and epidemiological information. The expected result is Negative.  Fact Sheet for Patients:  bloggercourse.com  Fact Sheet for Healthcare Providers:  seriousbroker.it  This test is no t yet approved or cleared by the [place]  FDA and  has been authorized for detection and/or diagnosis of SARS-CoV-2 by FDA under an Emergency Use Authorization (EUA). This EUA will remain  in effect (meaning this test can be used) for the duration of the COVID-19 declaration under Section 564(b)(1) of the Act, 21 U.S.C.section 360bbb-3(b)(1), unless the authorization is terminated  or revoked sooner.       Influenza A by PCR NEGATIVE NEGATIVE Final   Influenza B by PCR NEGATIVE NEGATIVE Final    Comment: (NOTE) The Xpert Xpress SARS-CoV-2/FLU/RSV plus assay is intended as an aid in the diagnosis of influenza from Nasopharyngeal swab specimens and should not be used as a sole basis for treatment. Nasal washings and aspirates are unacceptable for Xpert Xpress  SARS-CoV-2/FLU/RSV testing.  Fact Sheet for Patients: bloggercourse.com  Fact Sheet for Healthcare Providers: seriousbroker.it  This test is not yet approved or cleared by the [place]  FDA and has been authorized for detection and/or diagnosis of SARS-CoV-2 by FDA under an Emergency Use Authorization (EUA). This EUA will remain in effect (meaning this test can be used) for the duration of the COVID-19 declaration under Section 564(b)(1) of the Act, 21 U.S.C. section 360bbb-3(b)(1), unless the authorization is terminated or revoked.     Resp Syncytial Virus by PCR NEGATIVE NEGATIVE Final    Comment: (NOTE) Fact Sheet for Patients: bloggercourse.com  Fact Sheet for Healthcare Providers: seriousbroker.it  This test is not yet approved or cleared by the [place]  FDA and has been authorized for detection and/or diagnosis of SARS-CoV-2 by FDA under an Emergency Use Authorization (EUA). This EUA will remain in effect (meaning this test can be used) for the duration of the COVID-19 declaration under Section 564(b)(1) of the Act, 21 U.S.C. section 360bbb-3(b)(1), unless the authorization is terminated or revoked.  Performed at [facility], [address] [place], [place] [address]     Radiologic Imaging: CT Angio Chest Pulmonary Embolism (PE) W or WO Contrast Result Date: 05/18/2024 CLINICAL DATA:  Concern for pulmonary embolism.  Lymphoma. EXAM: CT ANGIOGRAPHY CHEST WITH CONTRAST TECHNIQUE: Multidetector CT imaging of the chest was performed using the standard protocol during bolus administration of intravenous contrast. Multiplanar CT image reconstructions and MIPs were obtained to evaluate the vascular anatomy. RADIATION DOSE REDUCTION: This exam was performed according to the departmental dose-optimization program which includes automated exposure control,  adjustment of the mA and/or kV according to patient size and/or use of iterative reconstruction technique. CONTRAST:  50mL OMNIPAQUE IOHEXOL 350 MG/ML SOLN COMPARISON:  Chest radiograph dated 05/17/2024. FINDINGS: Cardiovascular: There is no cardiomegaly or pericardial effusion. The thoracic aorta is unremarkable. The origins of the great vessels of the aortic arch are patent. Right-sided Port-A-Cath with tip in the right atrium at the cavoatrial junction. Linear nonocclusive right lower lobe subsegmental pulmonary artery embolus, age indeterminate. No CT evidence of right heart straining. Mediastinum/Nodes: No hilar or mediastinal adenopathy. The esophagus and the thyroid gland are grossly unremarkable no mediastinal fluid collection. Lungs/Pleura: Minimal bibasilar linear atelectasis. No focal consolidation, pleural effusion, or pneumothorax. The central airways are patent. Upper Abdomen: No acute abnormality. Musculoskeletal: Degenerative changes of spine. No acute osseous pathology. Bilateral shoulder arthroplasties. Review of the MIP images confirms the above findings. IMPRESSION: 1. Age indeterminate linear nonocclusive right lower lobe subsegmental pulmonary artery embolus. No CT evidence of right heart straining. 2. No focal consolidation. These results will be called to the ordering clinician or representative by the Radiologist Assistant, and communication documented in the PACS or [employer]. Electronically Signed   By: Vanetta Chou M.D.   On: 05/18/2024 14:56   CT ABDOMEN PELVIS W CONTRAST Result Date: 05/17/2024 CLINICAL DATA:  Abdominal pain.  Concern for kidney stone. EXAM: CT ABDOMEN AND PELVIS WITH CONTRAST TECHNIQUE: Multidetector CT imaging of the abdomen and pelvis was performed using the standard protocol following bolus administration of intravenous contrast. RADIATION DOSE REDUCTION: This exam was performed according to the departmental dose-optimization program which includes  automated exposure control, adjustment of the mA and/or kV according to patient size and/or use of iterative reconstruction technique. CONTRAST:  100mL OMNIPAQUE IOHEXOL 300 MG/ML  SOLN COMPARISON:  CT abdomen pelvis dated 04/08/2024. FINDINGS: Lower chest: The visualized lung bases are clear. Tiny nonocclusive embolus in the right lower lobe subsegmental branch (2/2). Chest CT may provide better evaluation. No intra-abdominal free air or free fluid. Hepatobiliary: The liver is unremarkable. No biliary dilatation. The gallbladder is unremarkable. Pancreas: Unremarkable. No pancreatic ductal dilatation or surrounding inflammatory changes. Spleen: Normal in size without focal abnormality. Adrenals/Urinary Tract: The adrenal glands unremarkable. Left ureteral stent with proximal tip in the inferior pole collecting system and distal end in the urinary bladder. There is mild left hydronephrosis. There is urothelial enhancement of the left renal collecting system and ureter suggestive of ascending UTI. No stone identified. The right kidney, right ureter, and urinary bladder appear unremarkable. Stomach/Bowel: Moderate stool throughout the colon. There is no bowel obstruction or active inflammation. Appendectomy. Vascular/Lymphatic: The abdominal aorta and IVC unremarkable. No portal venous gas. There is no adenopathy. Reproductive: The uterus is grossly unremarkable. Other: Interval decrease in the masslike soft tissues thickening of the left pelvic sidewall measuring approximately 5.8 x 3.3 cm in greatest axial dimensions (previously 6.7 x 4.8 cm). A 1.4 x 1.2 cm rim enhancing low attenuating focus within this soft tissue mass (coronal 52/5) suspicious for an area of phlegmonous change or developing abscess. Musculoskeletal: Degenerative changes of the spine. No acute osseous pathology. IMPRESSION: 1. Left ureteral stent with mild left hydronephrosis and findings suggestive of ascending UTI. Correlation with urinalysis  recommended. 2. Interval decrease in the masslike soft tissues thickening of the left pelvic sidewall. A 1.4 x 1.2 cm rim enhancing low attenuating focus within this soft tissue mass suspicious for an area of phlegmonous change or developing abscess. 3. Tiny nonocclusive embolus in the right lower lobe  subsegmental branch. Chest CT may provide better evaluation. These results were called by telephone at the time of interpretation on 05/17/2024 at 6:28 pm to provider LAMAR PRICE , who verbally acknowledged these results. Electronically Signed   By: Vanetta Chou M.D.   On: 05/17/2024 18:39   DG Chest Port 1 View Result Date: 05/17/2024 CLINICAL DATA:  Sepsis. EXAM: PORTABLE CHEST 1 VIEW COMPARISON:  Chest radiograph dated 09/17/2012 FINDINGS: Right-sided PICC with tip over central SVC. No focal consolidation, pleural effusion, pneumothorax. The cardiac silhouette is within limits. No acute osseous pathology. Bilateral shoulder arthroplasties. IMPRESSION: No active disease. Electronically Signed   By: Vanetta Chou M.D.   On: 05/17/2024 16:38   Assessment & Plan:  63 y.o. female with DLBCL on chemo per Dr. Babara with malignant left hydronephrosis s/p left ureteral stent placement, now admitted with febrile neutropenia and sepsis due to UTI.  She is clinically improving on empiric antibiotics.  She describes some acute on chronic stent symptoms/irritative voiding symptoms since admission.  She has been medically optimized for these with Flomax 0.4 mg daily, oxybutynin XL 10 mg daily, and Pyridium 3 times daily.  No indication for ureteral stent manipulation at this time.  Recommend continuing her medically optimized regimen for pain, continuing IV antibiotics, and will see her outpatient to discuss stent management.  She is in agreement with this plan.  Recommendations: - Continue antibiotics, Flomax, oxybutynin, and Pyridium for symptom control - Will arrange outpatient urology follow-up for stent  management  Thank you for involving me in this patient's care,  please page with any further questions or concerns.  Gabino Hagin, PA-C 05/19/2024 9:05 AM

## 2024-05-18 NOTE — Evaluation (Signed)
 Physical Therapy Evaluation Patient Details Name: Stacey [name] MRN: [id number] DOB: 27-Nov-1960 Today's Date: 05/18/2024  History of Present Illness  63 y.o. female with a known history of diffuse large B-cell lymphoma on chemotherapy, last treatment last week followed by Dr. Babara, left ureteral stent, GERD presents to the emergency department for evaluation of fever and dysuria.  Patient was in a usual state of health until night prior with dysuria as well as suprapubic, L pelvis and bilateral low back pain.  Of note in September 2025 CT angiogram showed an  obstructing mass along the left pelvic sidewall causing hydroureter and hydronephrosis.  Clinical Impression  Pt clearly uncomfortable on arrival, reporting lower abdomen and L LE pain.  Despite this she showed great effort with bed mobility and getting to standing with walker (minimal L LE WBing tolerance).  She was able to ambulate in the room ~35 ft with hopping/light L PWBing - fatigued with the effort; HR to 150s.  Pt states husband able to assist 24/7 and is not overly interested in continued PT at home, will benefit from continued PT to address functional imitations and facilitate safe d/c planning.        If plan is discharge home, recommend the following: A little help with walking and/or transfers;A little help with bathing/dressing/bathroom;Assistance with cooking/housework;Assist for transportation;Help with stairs or ramp for entrance   Can travel by private vehicle        Equipment Recommendations  (pt reports that w/c would likely be too big for getting around in her mobile home)  Recommendations for Other Services       Functional Status Assessment Patient has had a recent decline in their functional status and demonstrates the ability to make significant improvements in function in a reasonable and predictable amount of time.     Precautions / Restrictions Precautions Precautions: Fall Restrictions Weight Bearing  Restrictions Per Provider Order: No      Mobility  Bed Mobility Overal bed mobility: Needs Assistance Bed Mobility: Supine to Sit, Sit to Supine     Supine to sit: Contact guard Sit to supine: Min assist   General bed mobility comments: Despite clearly being in pain pt showed great effort with getting up to sitting, light assist with L LE getting back into bed but great effort - did need rails/UEs but very little actual assist    Transfers Overall transfer level: Modified independent Equipment used: Rolling walker (2 wheels)               General transfer comment: despite clearly being hesitant with L LE pain, she showed good effort and weight shifted forward and up in the walker - maintain essentially TTWBing on L, able to transition to light partial WBing    Ambulation/Gait Ambulation/Gait assistance: Contact guard assist Gait Distance (Feet): 35 Feet Assistive device: Rolling walker (2 wheels)         General Gait Details: Pt with great effort despite clearly being in pain, very limited L LE WBing but safe and competent with UEs on the walker.  Pt fatigued significantly with the effort and HR did reach the 150s.  Overall safe from a AD use and functional safety but limited 2/2 cardio and WBing tolerance stand-point  Stairs            Wheelchair Mobility     Tilt Bed    Modified Rankin (Stroke Patients Only)       Balance Overall balance assessment: Modified Independent (no LOBs despite  heavy UE use and lack of L LE WBing tolerance)                                           Pertinent Vitals/Pain Pain Assessment Pain Assessment: 0-10 Pain Score: 10-Worst pain ever Pain Location: lower abdomen, L>R Pain Intervention(s): Monitored during session (RN to give morphine  post session)    Home Living Family/patient expects to be discharged to:: Private residence Living Arrangements: Spouse/significant other Available Help at Discharge:  Available 24 hours/day Type of Home: Mobile home Home Access: Stairs to enter   [employer] of Steps: 4   Home Layout: One level Home Equipment: [employer] (2 wheels);Cane - single point      Prior Function Prior Level of Function : Needs assist             Mobility Comments: reports until 1 month ago could get around with [facility], recently needing walker and only OOH for MD ADLs Comments: husband helps with all home management, assists with bathing, dressing, she can perform pericare after using restroom     Extremity/Trunk Assessment   Upper Extremity Assessment Upper Extremity Assessment: Generalized weakness (hesitancy 2/2 general pain but functional strength t/o)    Lower Extremity Assessment Lower Extremity Assessment: Generalized weakness;LLE deficits/detail LLE Deficits / Details: Pt had AROM in L LE but struggled generally with against gravity movement (self assisted with UEs) and hesitant 2/2 pain       Communication   Communication Communication: No apparent difficulties    Cognition Arousal: Alert Behavior During Therapy: Anxious, WFL for tasks assessed/performed   PT - Cognitive impairments: No apparent impairments                       PT - Cognition Comments: Pt was pleasant but clearly in pain/uncomortable Following commands: Intact       Cueing       General Comments      Exercises     Assessment/Plan    PT Assessment Patient needs continued PT services  PT Problem List Decreased strength;Decreased range of motion;Decreased activity tolerance;Decreased balance;Decreased mobility;Decreased safety awareness;Decreased knowledge of use of DME;Cardiopulmonary status limiting activity;Pain       PT Treatment Interventions DME instruction;Gait training;Stair training;Functional mobility training;Therapeutic activities;Therapeutic exercise;Balance training;Neuromuscular re-education;Patient/family education    PT Goals  (Current goals can be found in the Care Plan section)  Acute Rehab PT Goals Patient Stated Goal: control pain, go home PT Goal Formulation: With patient Time For Goal Achievement: 05/31/24 Potential to Achieve Goals: Good    Frequency Min 1X/week     Co-evaluation               AM-PAC PT 6 Clicks Mobility  Outcome Measure Help needed turning from your back to your side while in a flat bed without using bedrails?: None Help needed moving from lying on your back to sitting on the side of a flat bed without using bedrails?: None Help needed moving to and from a bed to a chair (including a wheelchair)?: A Little Help needed standing up from a chair using your arms (e.g., wheelchair or bedside chair)?: A Little Help needed to walk in hospital room?: A Little Help needed climbing 3-5 steps with a railing? : Total 6 Click Score: 18    End of Session Equipment Utilized During Treatment: Gait belt Activity Tolerance:  Patient limited by fatigue Patient left: in bed;with call bell/phone within reach;with nursing/sitter in room Nurse Communication: Mobility status PT Visit Diagnosis: Muscle weakness (generalized) (M62.81);Difficulty in walking, not elsewhere classified (R26.2);Pain Pain - Right/Left: Left Pain - part of body: Hip    Time: 8843-8779 PT Time Calculation (min) (ACUTE ONLY): 24 min   Charges:   PT Evaluation $PT Eval Low Complexity: 1 Low   PT General Charges $$ ACUTE PT VISIT: 1 Visit         Carmin JONELLE Deed, DPT 05/18/2024, 1:52 PM

## 2024-05-19 ENCOUNTER — Inpatient Hospital Stay

## 2024-05-19 ENCOUNTER — Ambulatory Visit: Admitting: Urology

## 2024-05-19 DIAGNOSIS — D709 Neutropenia, unspecified: Secondary | ICD-10-CM | POA: Diagnosis not present

## 2024-05-19 DIAGNOSIS — I2699 Other pulmonary embolism without acute cor pulmonale: Secondary | ICD-10-CM | POA: Diagnosis not present

## 2024-05-19 DIAGNOSIS — A419 Sepsis, unspecified organism: Secondary | ICD-10-CM | POA: Diagnosis not present

## 2024-05-19 DIAGNOSIS — T451X5A Adverse effect of antineoplastic and immunosuppressive drugs, initial encounter: Secondary | ICD-10-CM

## 2024-05-19 DIAGNOSIS — D701 Agranulocytosis secondary to cancer chemotherapy: Secondary | ICD-10-CM

## 2024-05-19 DIAGNOSIS — N390 Urinary tract infection, site not specified: Secondary | ICD-10-CM | POA: Diagnosis not present

## 2024-05-19 DIAGNOSIS — R5081 Fever presenting with conditions classified elsewhere: Secondary | ICD-10-CM | POA: Diagnosis not present

## 2024-05-19 DIAGNOSIS — B952 Enterococcus as the cause of diseases classified elsewhere: Secondary | ICD-10-CM

## 2024-05-19 DIAGNOSIS — C8330 Diffuse large B-cell lymphoma, unspecified site: Secondary | ICD-10-CM | POA: Diagnosis not present

## 2024-05-19 DIAGNOSIS — G893 Neoplasm related pain (acute) (chronic): Secondary | ICD-10-CM | POA: Diagnosis not present

## 2024-05-19 LAB — CBC
HCT: 26.7 % — ABNORMAL LOW (ref 36.0–46.0)
Hemoglobin: 9.1 g/dL — ABNORMAL LOW (ref 12.0–15.0)
MCH: 30.6 pg (ref 26.0–34.0)
MCHC: 34.1 g/dL (ref 30.0–36.0)
MCV: 89.9 fL (ref 80.0–100.0)
Platelets: 26 K/uL — CL (ref 150–400)
RBC: 2.97 MIL/uL — ABNORMAL LOW (ref 3.87–5.11)
RDW: 12.5 % (ref 11.5–15.5)
WBC: 0.2 K/uL — CL (ref 4.0–10.5)
nRBC: 0 % (ref 0.0–0.2)

## 2024-05-19 LAB — IMMATURE PLATELET FRACTION: Immature Platelet Fraction: 6.9 % (ref 1.2–8.6)

## 2024-05-19 LAB — HEPARIN LEVEL (UNFRACTIONATED)
Heparin Unfractionated: 0.33 [IU]/mL (ref 0.30–0.70)
Heparin Unfractionated: 0.3 [IU]/mL (ref 0.30–0.70)

## 2024-05-19 MED ORDER — PHENAZOPYRIDINE HCL 200 MG PO TABS
200.0000 mg | ORAL_TABLET | Freq: Three times a day (TID) | ORAL | Status: AC
  Administered 2024-05-19 – 2024-05-21 (×6): 200 mg via ORAL
  Filled 2024-05-19 (×7): qty 1

## 2024-05-19 MED ORDER — GABAPENTIN 300 MG PO CAPS
300.0000 mg | ORAL_CAPSULE | Freq: Three times a day (TID) | ORAL | Status: DC
  Administered 2024-05-19 – 2024-05-22 (×9): 300 mg via ORAL
  Filled 2024-05-19 (×9): qty 1

## 2024-05-19 MED ORDER — SODIUM CHLORIDE 0.9 % IV SOLN
3.0000 g | Freq: Four times a day (QID) | INTRAVENOUS | Status: DC
  Administered 2024-05-19 – 2024-05-24 (×19): 3 g via INTRAVENOUS
  Filled 2024-05-19 (×20): qty 8

## 2024-05-19 NOTE — Plan of Care (Signed)
  Problem: Clinical Measurements: Goal: Will remain free from infection Outcome: Progressing Goal: Diagnostic test results will improve Outcome: Progressing   Problem: Activity: Goal: Risk for activity intolerance will decrease Outcome: Progressing   Problem: Coping: Goal: Level of anxiety will decrease Outcome: Progressing   Problem: Safety: Goal: Ability to remain free from injury will improve Outcome: Progressing

## 2024-05-19 NOTE — Progress Notes (Signed)
 INFECTIOUS DISEASE PROGRESS NOTE Date of Admission:  05/17/2024     ID: Stacey [name] is a 63 y.o. female with NTP fever Principal Problem:   Febrile neutropenia Active Problems:   Sepsis (HCC)   Pulmonary embolism (HCC)   Urinary tract infection without hematuria   Neutropenia   Subjective: No fevers, wbc 0.2, ANC P. Co ongoing dysuria but no nv, abd pain, flank pain or fever BCX neg 11/5. UCX  enterococcus  CT angio with PE. CT abd pelvis with mild L hydro, stent in place and findings possible ascending UTI.  ROS  Eleven systems are reviewed and negative except per hpi  Medications:  Antibiotics Given (last 72 hours)     Date/Time Action Medication Dose Rate   05/17/24 1619 New Bag/Given   cefTRIAXone (ROCEPHIN) 2 g in sodium chloride  0.9 % 100 mL IVPB 2 g 200 mL/hr   05/17/24 2215 New Bag/Given   ceFEPIme (MAXIPIME) 2 g in sodium chloride  0.9 % 100 mL IVPB 2 g 200 mL/hr   05/18/24 0558 New Bag/Given   ceFEPIme (MAXIPIME) 2 g in sodium chloride  0.9 % 100 mL IVPB 2 g 200 mL/hr   05/18/24 9166 Given   acyclovir (ZOVIRAX) 200 MG capsule 400 mg 400 mg    05/18/24 1331 New Bag/Given   ceFEPIme (MAXIPIME) 2 g in sodium chloride  0.9 % 100 mL IVPB 2 g 200 mL/hr   05/18/24 2211 New Bag/Given   meropenem (MERREM) 1 g in sodium chloride  0.9 % 100 mL IVPB 1 g 200 mL/hr   05/19/24 0609 New Bag/Given   meropenem (MERREM) 1 g in sodium chloride  0.9 % 100 mL IVPB 1 g 200 mL/hr   05/19/24 0905 Given   acyclovir (ZOVIRAX) 200 MG capsule 400 mg 400 mg        acyclovir  400 mg Oral Daily   allopurinol  300 mg Oral Daily   chlorhexidine   15 mL Mouth/Throat BID   Chlorhexidine  Gluconate Cloth  6 each Topical Daily   diclofenac Sodium  2 g Topical QID   feeding supplement  237 mL Oral BID BM   fentaNYL   1 patch Transdermal Q72H   gabapentin   300 mg Oral BID   loratadine  10 mg Oral Daily   oxybutynin  10 mg Oral Daily   pantoprazole  40 mg Oral Daily   phenazopyridine  100 mg  Oral TID WC   sodium chloride  flush  10-40 mL Intracatheter Q12H   sodium chloride  flush  3 mL Intravenous Q12H   tamsulosin  0.4 mg Oral Daily    Objective: Vital signs in last 24 hours: Temp:  [98.2 F (36.8 C)-99.3 F (37.4 C)] 99 F (37.2 C) (11/07 0813) Pulse Rate:  [80-88] 84 (11/07 0813) Resp:  [16-20] 20 (11/07 0813) BP: (118-158)/(58-73) 155/65 (11/07 0813) SpO2:  [96 %-99 %] 98 % (11/07 0813) Physical Exam  Constitutional:  oriented to person, place, and time. appears well-developed and well-nourished. No distress.  HENT: [place]/AT, PERRLA, no scleral icterus Mouth/Throat: Oropharynx is clear and moist. No oropharyngeal exudate.  Cardiovascular: Normal rate, regular rhythm and normal heart sounds. Exam reveals no gallop and no friction rub.  No murmur heard.  Pulmonary/Chest: Effort normal and breath sounds normal. No respiratory distress.  has no wheezes.  Neck = supple, no nuchal rigidity Abdominal: Soft. Bowel sounds are normal.  exhibits no distension. There is no tenderness.  Lymphadenopathy: no cervical adenopathy. No axillary adenopathy Neurological: alert and oriented to person, place, and  time.  Skin: Skin is warm and dry. No rash noted. No erythema.  Psychiatric: a normal mood and affect.  behavior is normal.    Lab Results Recent Labs    05/17/24 1613 05/18/24 0425 05/19/24 0130  WBC 0.1* 0.3* 0.2*  HGB 12.5 10.6* 9.1*  HCT 35.6* 31.1* 26.7*  NA 130* 134*  --   K 4.0 3.6  --   CL 95* 101  --   CO2 24 27  --   BUN 12 10  --   CREATININE 0.43* 0.44  --     Microbiology: Results for orders placed or performed during the hospital encounter of 05/17/24  Urine Culture     Status: Abnormal (Preliminary result)   Collection Time: 05/17/24  3:58 PM   Specimen: Urine, Clean Catch  Result Value Ref Range Status   Specimen Description   Final    URINE, CLEAN CATCH Performed at [facility], [address] [place], [place] [address]    Special  Requests   Final    NONE Reflexed from [id number] Performed at [facility], [address] [place], [place] [address]    Culture (A)  Final    >=100,000 COLONIES/mL ENTEROCOCCUS FAECALIS SUSCEPTIBILITIES TO FOLLOW Performed at [facility], [address] [place], [place] [address]    Report Status PENDING  Incomplete  Blood Culture (routine x 2)     Status: None (Preliminary result)   Collection Time: 05/17/24  4:13 PM   Specimen: BLOOD  Result Value Ref Range Status   Specimen Description BLOOD BLOOD RIGHT FOREARM  Final   Special Requests   Final    BOTTLES DRAWN AEROBIC AND ANAEROBIC Blood Culture results may not be optimal due to an inadequate volume of blood received in culture bottles   Culture   Final    NO GROWTH 2 DAYS Performed at [facility], [address], [place], [place] [address]    Report Status PENDING  Incomplete  Blood Culture (routine x 2)     Status: None (Preliminary result)   Collection Time: 05/17/24  4:13 PM   Specimen: BLOOD  Result Value Ref Range Status   Specimen Description BLOOD LEFT ANTECUBITAL  Final   Special Requests   Final    BOTTLES DRAWN AEROBIC AND ANAEROBIC Blood Culture results may not be optimal due to an inadequate volume of blood received in culture bottles   Culture   Final    NO GROWTH 2 DAYS Performed at [facility], [address] [place], [place] [address]    Report Status PENDING  Incomplete  Resp panel by RT-PCR (RSV, Flu A&B, Covid) Anterior Nasal Swab     Status: None   Collection Time: 05/17/24  8:09 PM   Specimen: Anterior Nasal Swab  Result Value Ref Range Status   SARS Coronavirus 2 by RT PCR NEGATIVE NEGATIVE Final    Comment: (NOTE) SARS-CoV-2 target nucleic acids are NOT DETECTED.  The SARS-CoV-2 RNA is generally detectable in upper respiratory specimens during the acute phase of infection. The lowest concentration of SARS-CoV-2 viral copies this assay can detect is 138  copies/mL. A negative result does not preclude SARS-Cov-2 infection and should not be used as the sole basis for treatment or other patient management decisions. A negative result may occur with  improper specimen collection/handling, submission of specimen other than nasopharyngeal swab, presence of viral mutation(s) within the areas targeted by this assay, and inadequate number of  viral copies(<138 copies/mL). A negative result must be combined with clinical observations, patient history, and epidemiological information. The expected result is Negative.  Fact Sheet for Patients:  bloggercourse.com  Fact Sheet for Healthcare Providers:  seriousbroker.it  This test is no t yet approved or cleared by the [place]  FDA and  has been authorized for detection and/or diagnosis of SARS-CoV-2 by FDA under an Emergency Use Authorization (EUA). This EUA will remain  in effect (meaning this test can be used) for the duration of the COVID-19 declaration under Section 564(b)(1) of the Act, 21 U.S.C.section 360bbb-3(b)(1), unless the authorization is terminated  or revoked sooner.       Influenza A by PCR NEGATIVE NEGATIVE Final   Influenza B by PCR NEGATIVE NEGATIVE Final    Comment: (NOTE) The Xpert Xpress SARS-CoV-2/FLU/RSV plus assay is intended as an aid in the diagnosis of influenza from Nasopharyngeal swab specimens and should not be used as a sole basis for treatment. Nasal washings and aspirates are unacceptable for Xpert Xpress SARS-CoV-2/FLU/RSV testing.  Fact Sheet for Patients: bloggercourse.com  Fact Sheet for Healthcare Providers: seriousbroker.it  This test is not yet approved or cleared by the [place]  FDA and has been authorized for detection and/or diagnosis of SARS-CoV-2 by FDA under an Emergency Use Authorization (EUA). This EUA will remain in effect (meaning  this test can be used) for the duration of the COVID-19 declaration under Section 564(b)(1) of the Act, 21 U.S.C. section 360bbb-3(b)(1), unless the authorization is terminated or revoked.     Resp Syncytial Virus by PCR NEGATIVE NEGATIVE Final    Comment: (NOTE) Fact Sheet for Patients: bloggercourse.com  Fact Sheet for Healthcare Providers: seriousbroker.it  This test is not yet approved or cleared by the [place]  FDA and has been authorized for detection and/or diagnosis of SARS-CoV-2 by FDA under an Emergency Use Authorization (EUA). This EUA will remain in effect (meaning this test can be used) for the duration of the COVID-19 declaration under Section 564(b)(1) of the Act, 21 U.S.C. section 360bbb-3(b)(1), unless the authorization is terminated or revoked.  Performed at [facility], [address], [place], [place] [address]     Studies/Results: CT Angio Chest Pulmonary Embolism (PE) W or WO Contrast Result Date: 05/18/2024 CLINICAL DATA:  Concern for pulmonary embolism.  Lymphoma. EXAM: CT ANGIOGRAPHY CHEST WITH CONTRAST TECHNIQUE: Multidetector CT imaging of the chest was performed using the standard protocol during bolus administration of intravenous contrast. Multiplanar CT image reconstructions and MIPs were obtained to evaluate the vascular anatomy. RADIATION DOSE REDUCTION: This exam was performed according to the departmental dose-optimization program which includes automated exposure control, adjustment of the mA and/or kV according to patient size and/or use of iterative reconstruction technique. CONTRAST:  50mL OMNIPAQUE IOHEXOL 350 MG/ML SOLN COMPARISON:  Chest radiograph dated 05/17/2024. FINDINGS: Cardiovascular: There is no cardiomegaly or pericardial effusion. The thoracic aorta is unremarkable. The origins of the great vessels of the aortic arch are patent. Right-sided Port-A-Cath with tip in the  right atrium at the cavoatrial junction. Linear nonocclusive right lower lobe subsegmental pulmonary artery embolus, age indeterminate. No CT evidence of right heart straining. Mediastinum/Nodes: No hilar or mediastinal adenopathy. The esophagus and the thyroid gland are grossly unremarkable no mediastinal fluid collection. Lungs/Pleura: Minimal bibasilar linear atelectasis. No focal consolidation, pleural effusion, or pneumothorax. The central airways are patent. Upper Abdomen: No acute abnormality. Musculoskeletal: Degenerative changes of spine. No acute osseous pathology. Bilateral shoulder arthroplasties. Review of the MIP images confirms the  above findings. IMPRESSION: 1. Age indeterminate linear nonocclusive right lower lobe subsegmental pulmonary artery embolus. No CT evidence of right heart straining. 2. No focal consolidation. These results will be called to the ordering clinician or representative by the Radiologist Assistant, and communication documented in the PACS or [employer]. Electronically Signed   By: Vanetta Chou M.D.   On: 05/18/2024 14:56   CT ABDOMEN PELVIS W CONTRAST Result Date: 05/17/2024 CLINICAL DATA:  Abdominal pain.  Concern for kidney stone. EXAM: CT ABDOMEN AND PELVIS WITH CONTRAST TECHNIQUE: Multidetector CT imaging of the abdomen and pelvis was performed using the standard protocol following bolus administration of intravenous contrast. RADIATION DOSE REDUCTION: This exam was performed according to the departmental dose-optimization program which includes automated exposure control, adjustment of the mA and/or kV according to patient size and/or use of iterative reconstruction technique. CONTRAST:  100mL OMNIPAQUE IOHEXOL 300 MG/ML  SOLN COMPARISON:  CT abdomen pelvis dated 04/08/2024. FINDINGS: Lower chest: The visualized lung bases are clear. Tiny nonocclusive embolus in the right lower lobe subsegmental branch (2/2). Chest CT may provide better evaluation. No  intra-abdominal free air or free fluid. Hepatobiliary: The liver is unremarkable. No biliary dilatation. The gallbladder is unremarkable. Pancreas: Unremarkable. No pancreatic ductal dilatation or surrounding inflammatory changes. Spleen: Normal in size without focal abnormality. Adrenals/Urinary Tract: The adrenal glands unremarkable. Left ureteral stent with proximal tip in the inferior pole collecting system and distal end in the urinary bladder. There is mild left hydronephrosis. There is urothelial enhancement of the left renal collecting system and ureter suggestive of ascending UTI. No stone identified. The right kidney, right ureter, and urinary bladder appear unremarkable. Stomach/Bowel: Moderate stool throughout the colon. There is no bowel obstruction or active inflammation. Appendectomy. Vascular/Lymphatic: The abdominal aorta and IVC unremarkable. No portal venous gas. There is no adenopathy. Reproductive: The uterus is grossly unremarkable. Other: Interval decrease in the masslike soft tissues thickening of the left pelvic sidewall measuring approximately 5.8 x 3.3 cm in greatest axial dimensions (previously 6.7 x 4.8 cm). A 1.4 x 1.2 cm rim enhancing low attenuating focus within this soft tissue mass (coronal 52/5) suspicious for an area of phlegmonous change or developing abscess. Musculoskeletal: Degenerative changes of the spine. No acute osseous pathology. IMPRESSION: 1. Left ureteral stent with mild left hydronephrosis and findings suggestive of ascending UTI. Correlation with urinalysis recommended. 2. Interval decrease in the masslike soft tissues thickening of the left pelvic sidewall. A 1.4 x 1.2 cm rim enhancing low attenuating focus within this soft tissue mass suspicious for an area of phlegmonous change or developing abscess. 3. Tiny nonocclusive embolus in the right lower lobe subsegmental branch. Chest CT may provide better evaluation. These results were called by telephone at the time  of interpretation on 05/17/2024 at 6:28 pm to provider LAMAR PRICE , who verbally acknowledged these results. Electronically Signed   By: Vanetta Chou M.D.   On: 05/17/2024 18:39   DG Chest Port 1 View Result Date: 05/17/2024 CLINICAL DATA:  Sepsis. EXAM: PORTABLE CHEST 1 VIEW COMPARISON:  Chest radiograph dated 09/17/2012 FINDINGS: Right-sided PICC with tip over central SVC. No focal consolidation, pleural effusion, pneumothorax. The cardiac silhouette is within limits. No acute osseous pathology. Bilateral shoulder arthroplasties. IMPRESSION: No active disease. Electronically Signed   By: Vanetta Chou M.D.   On: 05/17/2024 16:38    Assessment/Plan:  63 y.o. with a history of diffuse large B cell lymphoma Received first cycle of R-CHOP on 05/10/24- and on D3 got GCSF  Febrile neutropenia-  Complicated UTI- cx with enterococcus Pt has left ureteric stent 9/27 for hydronephrosis secondary to left pelvic mass.  Had Dysuria severe in spite of 4 doses of cefepime so changed to meropenem. Enterococcus would not be covered by cefepime Can narrow to unasyn  Diffuse large b cell lymphoma in the left pelvic area- no other sites- possible spleen involvement Has received 1 dose of R-CHOP There is some phlegmon/small abscess in the center of the mass- could be necrosis due to chemo- unasyn  will cover anerobes as well  Thank you very much for the consult. Will follow with you.  Alm SHAUNNA Needle   05/19/2024, 9:53 AM

## 2024-05-19 NOTE — Progress Notes (Signed)
 Hematology/Oncology Progress note [phone] [phone]     Patient Care Team: [name], DEVONNA as PCP - General (Physician Assistant) Babara Call, MD as Consulting Physician (Oncology)   Name of the patient: Stacey [name]  [id number]  [birth year]  Date of visit: 05/19/24   INTERVAL HISTORY-  afebrile Left calf/ankle burning/tingling, exacerbated after walking. .     Allergies  Allergen Reactions   Propofol  Nausea Only    Patient states medication can be tolerated with an antiemetic before taking    Patient Active Problem List   Diagnosis Date Noted   Diffuse large B-cell lymphoma (HCC) 04/26/2024    Priority: High   Neoplasm related pain 05/10/2024    Priority: Medium    Encounter for antineoplastic chemotherapy 05/10/2024    Priority: Medium    Sepsis (HCC) 05/18/2024   Pulmonary embolism (HCC) 05/18/2024   Urinary tract infection without hematuria 05/18/2024   Neutropenia 05/18/2024   Febrile neutropenia 05/17/2024   Pre-diabetes    Hydronephrosis of left kidney 04/08/2024   AKI (acute kidney injury) 04/08/2024   Hx of total shoulder replacement, right 02/08/2020   Osteoarthritis of left shoulder 01/11/2018   S/P shoulder replacement, left 01/11/2018     Past Medical History:  Diagnosis Date   Arthritis    left shoulder, neck, lower back  (01/11/2018)   Chronic lower back pain    Family history of adverse reaction to anesthesia    Mother has nausea   GERD (gastroesophageal reflux disease)    PONV (postoperative nausea and vomiting)    Pre-diabetes    UTI (urinary tract infection) 03/20/2021     Past Surgical History:  Procedure Laterality Date   APPENDECTOMY     CYSTOSCOPY W/ URETERAL STENT PLACEMENT Left 04/08/2024   Procedure: CYSTOSCOPY, WITH RETROGRADE PYELOGRAM AND URETERAL STENT INSERTION;  Surgeon: Watt Rush, MD;  Location: [facility] ORS;  Service: Urology;  Laterality: Left;   IR IMAGING GUIDED PORT INSERTION   05/01/2024   JOINT REPLACEMENT     TOTAL SHOULDER ARTHROPLASTY Left 01/11/2018   TOTAL SHOULDER ARTHROPLASTY Left 01/11/2018   Procedure: LEFT TOTAL SHOULDER ARTHROPLASTY;  Surgeon: Sharl Selinda Dover, MD;  Location: [facility] OR;  Service: Orthopedics;  Laterality: Left;  2.5 hrs   TOTAL SHOULDER ARTHROPLASTY Right 02/08/2020   Procedure: TOTAL SHOULDER ARTHROPLASTY;  Surgeon: Sharl Selinda Dover, MD;  Location: [facility] OR;  Service: Orthopedics;  Laterality: Right;  2.5 hrs RNFA    Social History   Socioeconomic History   Marital status: Married    Spouse name: Tonnette Zwiebel   Number of children: Not on file   Years of education: Not on file   Highest education level: Not on file  Occupational History   Not on file  Tobacco Use   Smoking status: Never   Smokeless tobacco: Never  Vaping Use   Vaping status: Never Used  Substance and Sexual Activity   Alcohol use: Not Currently    Comment: 01/11/2018 might have 1 drink/month   Drug use: Never   Sexual activity: Yes  Other Topics Concern   Not on file  Social History Narrative   Not on file   Social Drivers of Health   Financial Resource Strain: Low Risk  (03/30/2024)   Received from [facility]   Overall Financial Resource Strain (CARDIA)    Difficulty of Paying Living Expenses: Not hard at all  Food Insecurity: No Food Insecurity (05/17/2024)   Hunger Vital Sign    Worried About  Running Out of Food in the Last Year: Never true    Ran Out of Food in the Last Year: Never true  Transportation Needs: No Transportation Needs (05/17/2024)   PRAPARE - Administrator, Civil Service (Medical): No    Lack of Transportation (Non-Medical): No  Physical Activity: Not on file  Stress: Not on file  Social Connections: Socially Integrated (05/17/2024)   Social Connection and Isolation Panel    Frequency of Communication with Friends and Family: Three times a week    Frequency of Social Gatherings with Friends  and Family: Three times a week    Attends Religious Services: 1 to 4 times per year    Active Member of Clubs or Organizations: Yes    Attends Banker Meetings: 1 to 4 times per year    Marital Status: Married  Catering Manager Violence: Not At Risk (05/17/2024)   Humiliation, Afraid, Rape, and Kick questionnaire    Fear of Current or Ex-Partner: No    Emotionally Abused: No    Physically Abused: No    Sexually Abused: No     Family History  Problem Relation Age of Onset   Hypertension Mother    Heart disease Father    Heart attack Father    CAD Father    Colon cancer Neg Hx    Esophageal cancer Neg Hx    Rectal cancer Neg Hx    Stomach cancer Neg Hx      Current Facility-Administered Medications:    acetaminophen  (TYLENOL ) tablet 650 mg, 650 mg, Oral, Q6H PRN, 650 mg at 05/19/24 1555 **OR** acetaminophen  (TYLENOL ) suppository 650 mg, 650 mg, Rectal, Q6H PRN, Hugelmeyer, Alexis, DO   acyclovir (ZOVIRAX) 200 MG capsule 400 mg, 400 mg, Oral, Daily, Hugelmeyer, Alexis, DO, 400 mg at 05/19/24 0905   allopurinol (ZYLOPRIM) tablet 300 mg, 300 mg, Oral, Daily, Hugelmeyer, Alexis, DO, 300 mg at 05/19/24 0905   Ampicillin-Sulbactam (UNASYN) 3 g in sodium chloride  0.9 % 100 mL IVPB, 3 g, Intravenous, Q6H, Epifanio Alm SQUIBB, MD   bisacodyl (DULCOLAX) EC tablet 5 mg, 5 mg, Oral, Daily PRN, Hugelmeyer, Alexis, DO, 5 mg at 05/19/24 0605   chlorhexidine  (PERIDEX ) 0.12 % solution 15 mL, 15 mL, Mouth/Throat, BID, Babara Call, MD, 15 mL at 05/19/24 0910   Chlorhexidine  Gluconate Cloth 2 % PADS 6 each, 6 each, Topical, Daily, Hugelmeyer, Alexis, DO, 6 each at 05/19/24 1027   feeding supplement (ENSURE PLUS HIGH PROTEIN) liquid 237 mL, 237 mL, Oral, BID BM, Hugelmeyer, Alexis, DO, 237 mL at 05/19/24 1319   fentaNYL  (DURAGESIC ) 25 MCG/HR 1 patch, 1 patch, Transdermal, Q72H, Hugelmeyer, Alexis, DO, 1 patch at 05/18/24 0423   gabapentin  (NEURONTIN ) capsule 300 mg, 300 mg, Oral, TID, Babara Call,  MD, 300 mg at 05/19/24 1555   hydrALAZINE (APRESOLINE) injection 5 mg, 5 mg, Intravenous, Q6H PRN, Hugelmeyer, Alexis, DO   loratadine (CLARITIN) tablet 10 mg, 10 mg, Oral, Daily, Hugelmeyer, Alexis, DO, 10 mg at 05/19/24 0905   methocarbamol  (ROBAXIN ) tablet 500 mg, 500 mg, Oral, Q8H PRN, Hugelmeyer, Alexis, DO, 500 mg at 05/18/24 1913   morphine  (PF) 2 MG/ML injection 2 mg, 2 mg, Intravenous, Q4H PRN, Patel, Sona, MD, 2 mg at 05/19/24 1556   ondansetron  (ZOFRAN ) tablet 4 mg, 4 mg, Oral, Q6H PRN **OR** ondansetron  (ZOFRAN ) injection 4 mg, 4 mg, Intravenous, Q6H PRN, Hugelmeyer, Alexis, DO, 4 mg at 05/17/24 2102   oxybutynin (DITROPAN-XL) 24 hr tablet 10 mg, 10 mg, Oral,  Daily, Hugelmeyer, Alexis, DO, 10 mg at 05/19/24 0910   oxyCODONE  (Oxy IR/ROXICODONE ) immediate release tablet 5 mg, 5 mg, Oral, Q4H PRN, Hugelmeyer, Alexis, DO, 5 mg at 05/19/24 1834   pantoprazole (PROTONIX) EC tablet 40 mg, 40 mg, Oral, Daily, Hugelmeyer, Alexis, DO, 40 mg at 05/19/24 0905   phenazopyridine (PYRIDIUM) tablet 200 mg, 200 mg, Oral, TID WC, Fitzgerald, David P, MD, 200 mg at 05/19/24 1555   senna-docusate (Senokot-S) tablet 1 tablet, 1 tablet, Oral, QHS PRN, Hugelmeyer, Alexis, DO   sodium chloride  flush (NS) 0.9 % injection 10-40 mL, 10-40 mL, Intracatheter, Q12H, Hugelmeyer, Alexis, DO, 10 mL at 05/19/24 0913   sodium chloride  flush (NS) 0.9 % injection 10-40 mL, 10-40 mL, Intracatheter, PRN, Hugelmeyer, Alexis, DO, 10 mL at 05/18/24 0217   sodium chloride  flush (NS) 0.9 % injection 3 mL, 3 mL, Intravenous, Q12H, Hugelmeyer, Alexis, DO, 3 mL at 05/19/24 0913   tamsulosin (FLOMAX) capsule 0.4 mg, 0.4 mg, Oral, Daily, Hugelmeyer, Alexis, DO, 0.4 mg at 05/19/24 0913   traZODone (DESYREL) tablet 25 mg, 25 mg, Oral, QHS PRN, Hugelmeyer, Alexis, DO, 25 mg at 05/19/24 0210   Physical exam:  Vitals:   05/19/24 0813 05/19/24 1208 05/19/24 1549 05/19/24 1944  BP: (!) 155/65 (!) 155/74 136/71 139/64  Pulse: 84 96  87   Resp: 20 19  16   Temp: 99 F (37.2 C) 99.3 F (37.4 C) (!) 101.1 F (38.4 C) 98.9 F (37.2 C)  TempSrc: Oral   Oral  SpO2: 98% 97% 98% 94%  Weight:      Height:       Physical Exam Constitutional:      General: She is not in acute distress. HENT:     Head: Normocephalic and atraumatic.  Eyes:     General: No scleral icterus. Cardiovascular:     Rate and Rhythm: Normal rate and regular rhythm.  Pulmonary:     Effort: Pulmonary effort is normal. No respiratory distress.     Breath sounds: No wheezing.  Abdominal:     General: There is no distension.     Palpations: Abdomen is soft.     Tenderness: There is no abdominal tenderness.  Musculoskeletal:        General: Normal range of motion.     Cervical back: Normal range of motion and neck supple.  Skin:    Findings: No erythema.  Neurological:     Mental Status: She is alert and oriented to person, place, and time. Mental status is at baseline.     Motor: No abnormal muscle tone.  Psychiatric:        Mood and Affect: Mood and affect normal.       Labs    Latest Ref Rng & Units 05/19/2024    1:30 AM 05/18/2024    4:25 AM 05/17/2024    4:13 PM  CBC  WBC 4.0 - 10.5 K/uL 0.2  0.3  0.1   Hemoglobin 12.0 - 15.0 g/dL 9.1  89.3  87.4   Hematocrit 36.0 - 46.0 % 26.7  31.1  35.6   Platelets 150 - 400 K/uL 26  43  58       Latest Ref Rng & Units 05/18/2024    4:25 AM 05/17/2024    4:13 PM 05/12/2024    3:08 PM  CMP  Glucose 70 - 99 mg/dL 870  845  845   BUN 8 - 23 mg/dL 10  12  23    Creatinine 0.44 -  1.00 mg/dL 9.55  9.56  9.45   Sodium 135 - 145 mmol/L 134  130  134   Potassium 3.5 - 5.1 mmol/L 3.6  4.0  3.7   Chloride 98 - 111 mmol/L 101  95  101   CO2 22 - 32 mmol/L 27  24  24    Calcium 8.9 - 10.3 mg/dL 8.4  9.0  8.6   Total Protein 6.5 - 8.1 g/dL 5.9  7.5  7.0   Total Bilirubin 0.0 - 1.2 mg/dL 0.9  1.3  0.7   Alkaline Phos 38 - 126 U/L 49  64  55   AST 15 - 41 U/L 12  16  20    ALT 0 - 44 U/L 7  10  12        RADIOGRAPHIC STUDIES: I have personally reviewed the radiological images as listed and agreed with the findings in the report. US  Venous Img Lower Bilateral (DVT) Result Date: 05/19/2024 CLINICAL DATA:  Lower extremity pain. EXAM: BILATERAL LOWER EXTREMITY VENOUS DOPPLER ULTRASOUND TECHNIQUE: Gray-scale sonography with graded compression, as well as color Doppler and duplex ultrasound were performed to evaluate the lower extremity deep venous systems from the level of the common femoral vein and including the common femoral, femoral, profunda femoral, popliteal and calf veins including the posterior tibial, peroneal and gastrocnemius veins when visible. The superficial great saphenous vein was also interrogated. Spectral Doppler was utilized to evaluate flow at rest and with distal augmentation maneuvers in the common femoral, femoral and popliteal veins. COMPARISON:  None Available. FINDINGS: RIGHT LOWER EXTREMITY Common Femoral Vein: No evidence of thrombus. Normal compressibility, respiratory phasicity and response to augmentation. Saphenofemoral Junction: No evidence of thrombus. Normal compressibility and flow on color Doppler imaging. Profunda Femoral Vein: No evidence of thrombus. Normal compressibility and flow on color Doppler imaging. Femoral Vein: No evidence of thrombus. Normal compressibility, respiratory phasicity and response to augmentation. Popliteal Vein: No evidence of thrombus. Normal compressibility, respiratory phasicity and response to augmentation. Calf Veins: No evidence of thrombus. Normal compressibility and flow on color Doppler imaging. Superficial Great Saphenous Vein: No evidence of thrombus. Normal compressibility. Venous Reflux:  None. Other Findings: No evidence of superficial thrombophlebitis or abnormal fluid collection. LEFT LOWER EXTREMITY Common Femoral Vein: No evidence of thrombus. Normal compressibility, respiratory phasicity and response to augmentation.  Saphenofemoral Junction: No evidence of thrombus. Normal compressibility and flow on color Doppler imaging. Profunda Femoral Vein: No evidence of thrombus. Normal compressibility and flow on color Doppler imaging. Femoral Vein: No evidence of thrombus. Normal compressibility, respiratory phasicity and response to augmentation. Popliteal Vein: No evidence of thrombus. Normal compressibility, respiratory phasicity and response to augmentation. Calf Veins: No evidence of thrombus. Normal compressibility and flow on color Doppler imaging. Superficial Great Saphenous Vein: No evidence of thrombus. Normal compressibility. Venous Reflux:  None. Other Findings: No evidence of superficial thrombophlebitis or abnormal fluid collection. IMPRESSION: No evidence of deep venous thrombosis in either lower extremity. Electronically Signed   By: Marcey Moan M.D.   On: 05/19/2024 17:20   CT Angio Chest Pulmonary Embolism (PE) W or WO Contrast Result Date: 05/18/2024 CLINICAL DATA:  Concern for pulmonary embolism.  Lymphoma. EXAM: CT ANGIOGRAPHY CHEST WITH CONTRAST TECHNIQUE: Multidetector CT imaging of the chest was performed using the standard protocol during bolus administration of intravenous contrast. Multiplanar CT image reconstructions and MIPs were obtained to evaluate the vascular anatomy. RADIATION DOSE REDUCTION: This exam was performed according to the departmental dose-optimization program which includes automated exposure control,  adjustment of the mA and/or kV according to patient size and/or use of iterative reconstruction technique. CONTRAST:  50mL OMNIPAQUE IOHEXOL 350 MG/ML SOLN COMPARISON:  Chest radiograph dated 05/17/2024. FINDINGS: Cardiovascular: There is no cardiomegaly or pericardial effusion. The thoracic aorta is unremarkable. The origins of the great vessels of the aortic arch are patent. Right-sided Port-A-Cath with tip in the right atrium at the cavoatrial junction. Linear nonocclusive right lower  lobe subsegmental pulmonary artery embolus, age indeterminate. No CT evidence of right heart straining. Mediastinum/Nodes: No hilar or mediastinal adenopathy. The esophagus and the thyroid gland are grossly unremarkable no mediastinal fluid collection. Lungs/Pleura: Minimal bibasilar linear atelectasis. No focal consolidation, pleural effusion, or pneumothorax. The central airways are patent. Upper Abdomen: No acute abnormality. Musculoskeletal: Degenerative changes of spine. No acute osseous pathology. Bilateral shoulder arthroplasties. Review of the MIP images confirms the above findings. IMPRESSION: 1. Age indeterminate linear nonocclusive right lower lobe subsegmental pulmonary artery embolus. No CT evidence of right heart straining. 2. No focal consolidation. These results will be called to the ordering clinician or representative by the Radiologist Assistant, and communication documented in the PACS or [employer]. Electronically Signed   By: Vanetta Chou M.D.   On: 05/18/2024 14:56   CT ABDOMEN PELVIS W CONTRAST Result Date: 05/17/2024 CLINICAL DATA:  Abdominal pain.  Concern for kidney stone. EXAM: CT ABDOMEN AND PELVIS WITH CONTRAST TECHNIQUE: Multidetector CT imaging of the abdomen and pelvis was performed using the standard protocol following bolus administration of intravenous contrast. RADIATION DOSE REDUCTION: This exam was performed according to the departmental dose-optimization program which includes automated exposure control, adjustment of the mA and/or kV according to patient size and/or use of iterative reconstruction technique. CONTRAST:  100mL OMNIPAQUE IOHEXOL 300 MG/ML  SOLN COMPARISON:  CT abdomen pelvis dated 04/08/2024. FINDINGS: Lower chest: The visualized lung bases are clear. Tiny nonocclusive embolus in the right lower lobe subsegmental branch (2/2). Chest CT may provide better evaluation. No intra-abdominal free air or free fluid. Hepatobiliary: The liver is unremarkable.  No biliary dilatation. The gallbladder is unremarkable. Pancreas: Unremarkable. No pancreatic ductal dilatation or surrounding inflammatory changes. Spleen: Normal in size without focal abnormality. Adrenals/Urinary Tract: The adrenal glands unremarkable. Left ureteral stent with proximal tip in the inferior pole collecting system and distal end in the urinary bladder. There is mild left hydronephrosis. There is urothelial enhancement of the left renal collecting system and ureter suggestive of ascending UTI. No stone identified. The right kidney, right ureter, and urinary bladder appear unremarkable. Stomach/Bowel: Moderate stool throughout the colon. There is no bowel obstruction or active inflammation. Appendectomy. Vascular/Lymphatic: The abdominal aorta and IVC unremarkable. No portal venous gas. There is no adenopathy. Reproductive: The uterus is grossly unremarkable. Other: Interval decrease in the masslike soft tissues thickening of the left pelvic sidewall measuring approximately 5.8 x 3.3 cm in greatest axial dimensions (previously 6.7 x 4.8 cm). A 1.4 x 1.2 cm rim enhancing low attenuating focus within this soft tissue mass (coronal 52/5) suspicious for an area of phlegmonous change or developing abscess. Musculoskeletal: Degenerative changes of the spine. No acute osseous pathology. IMPRESSION: 1. Left ureteral stent with mild left hydronephrosis and findings suggestive of ascending UTI. Correlation with urinalysis recommended. 2. Interval decrease in the masslike soft tissues thickening of the left pelvic sidewall. A 1.4 x 1.2 cm rim enhancing low attenuating focus within this soft tissue mass suspicious for an area of phlegmonous change or developing abscess. 3. Tiny nonocclusive embolus in the right lower lobe  subsegmental branch. Chest CT may provide better evaluation. These results were called by telephone at the time of interpretation on 05/17/2024 at 6:28 pm to provider LAMAR PRICE , who verbally  acknowledged these results. Electronically Signed   By: Vanetta Chou M.D.   On: 05/17/2024 18:39   DG Chest Port 1 View Result Date: 05/17/2024 CLINICAL DATA:  Sepsis. EXAM: PORTABLE CHEST 1 VIEW COMPARISON:  Chest radiograph dated 09/17/2012 FINDINGS: Right-sided PICC with tip over central SVC. No focal consolidation, pleural effusion, pneumothorax. The cardiac silhouette is within limits. No acute osseous pathology. Bilateral shoulder arthroplasties. IMPRESSION: No active disease. Electronically Signed   By: Vanetta Chou M.D.   On: 05/17/2024 16:38   IR IMAGING GUIDED PORT INSERTION Result Date: 05/01/2024 CLINICAL DATA:  Large B-cell lymphoma of the pelvis and need for porta cath to begin chemotherapy. EXAM: IMPLANTED PORT A CATH PLACEMENT WITH ULTRASOUND AND FLUOROSCOPIC GUIDANCE ANESTHESIA/SEDATION: Moderate (conscious) sedation was employed during this procedure. A total of Versed  1.0 mg and Fentanyl  50 mcg was administered intravenously. Moderate Sedation Time: 28 minutes. The patient's level of consciousness and vital signs were monitored continuously by radiology nursing throughout the procedure under my direct supervision. FLUOROSCOPY: Radiation Exposure Index: 1.9 mGy Kerma PROCEDURE: The procedure, risks, benefits, and alternatives were explained to the patient. Questions regarding the procedure were encouraged and answered. The patient understands and consents to the procedure. A time-out was performed prior to initiating the procedure. Ultrasound was utilized to confirm patency of the right internal jugular vein. An ultrasound image was saved and recorded. The right neck and chest were prepped with chlorhexidine  in a sterile fashion, and a sterile drape was applied covering the operative field. Maximum barrier sterile technique with sterile gowns and gloves were used for the procedure. Local anesthesia was provided with 1% lidocaine . After creating a small venotomy incision, a 21 gauge  needle was advanced into the right internal jugular vein under direct, real-time ultrasound guidance. Ultrasound image documentation was performed. After securing guidewire access, an 8 Fr dilator was placed. A J-wire was kinked to measure appropriate catheter length. A subcutaneous port pocket was then created along the upper chest wall utilizing sharp and blunt dissection. Portable cautery was utilized. The pocket was irrigated with sterile saline. A single lumen power injectable port was chosen for placement. The 8 Fr catheter was tunneled from the port pocket site to the venotomy incision. The port was placed in the pocket. External catheter was trimmed to appropriate length based on guidewire measurement. At the venotomy, an 8 Fr peel-away sheath was placed over a guidewire. The catheter was then placed through the sheath and the sheath removed. Final catheter positioning was confirmed and documented with a fluoroscopic spot image. The port was accessed with a needle and aspirated and flushed with heparinized saline. The access needle was removed. The venotomy and port pocket incisions were closed with subcutaneous 3-0 Monocryl and subcuticular 4-0 Vicryl. Dermabond was applied to both incisions. COMPLICATIONS: COMPLICATIONS None FINDINGS: After catheter placement, the tip lies at the cavo-atrial junction. The catheter aspirates normally and is ready for immediate use. IMPRESSION: Placement of single lumen port a cath via right internal jugular vein. The catheter tip lies at the cavo-atrial junction. A power injectable port a cath was placed and is ready for immediate use. Electronically Signed   By: Marcey Moan M.D.   On: 05/01/2024 11:53   CT BONE MARROW BIOPSY & ASPIRATION Result Date: 05/01/2024 CLINICAL DATA:  Large B-cell lymphoma of  pelvic sidewall and need for bone marrow biopsy. EXAM: CT GUIDED BONE MARROW ASPIRATION AND BIOPSY ANESTHESIA/SEDATION: Moderate (conscious) sedation was employed  during this procedure. A total of Versed  2.0 mg and Fentanyl  100 mcg was administered intravenously. Moderate Sedation Time: 10 minutes. The patient's level of consciousness and vital signs were monitored continuously by radiology nursing throughout the procedure under my direct supervision. PROCEDURE: The procedure risks, benefits, and alternatives were explained to the patient. Questions regarding the procedure were encouraged and answered. The patient understands and consents to the procedure. A time out was performed prior to initiating the procedure. The right gluteal region was prepped with chlorhexidine . Sterile gown and sterile gloves were used for the procedure. Local anesthesia was provided with 1% Lidocaine . Under CT guidance, an 11 gauge On Control bone cutting needle was advanced from a posterior approach into the right iliac bone. Needle positioning was confirmed with CT. Initial non heparinized and heparinized aspirate samples were obtained of bone marrow. Core biopsy was performed via the On Control drill needle. COMPLICATIONS: None FINDINGS: Inspection of initial aspirate did reveal visible particles. Intact core biopsy sample was obtained. IMPRESSION: CT guided bone marrow biopsy of right posterior iliac bone with both aspirate and core samples obtained. Electronically Signed   By: Marcey Moan M.D.   On: 05/01/2024 10:41   ECHOCARDIOGRAM COMPLETE Result Date: 04/26/2024    ECHOCARDIOGRAM REPORT   Patient Name:   RENEZMAE CANLAS Date of Exam: 04/26/2024 Medical Rec #:  [id number]          Height:       64.0 in Accession #:    [id number]         Weight:       170.3 lb Date of Birth:  [birth year]          BSA:          1.827 m Patient Age:    63 years           BP:           121/67 mmHg Patient Gender: F                  HR:           54 bpm. Exam Location:  [facility] Procedure: 2D Echo, 3D Echo, Cardiac Doppler, Color Doppler and Strain Analysis            (Both Spectral and Color Flow Doppler  were utilized during            procedure). Indications:     Chemo Z09  History:         Patient has no prior history of Echocardiogram examinations.  Sonographer:     Thea Norlander RCS Referring Phys:  [id number] Petrice Beedy Diagnosing Phys: Evalene Lunger MD IMPRESSIONS  1. Left ventricular ejection fraction, by estimation, is 55 to 60%. The left ventricle has normal function. The left ventricle has no regional wall motion abnormalities. Left ventricular diastolic parameters are consistent with Grade I diastolic dysfunction (impaired relaxation). The average left ventricular global longitudinal strain is -19.7 %. The global longitudinal strain is normal.  2. Right ventricular systolic function is normal. The right ventricular size is normal.  3. The mitral valve is normal in structure. Mild mitral valve regurgitation. No evidence of mitral stenosis.  4. The aortic valve is normal in structure. Aortic valve regurgitation is not visualized. No aortic stenosis is present.  5. The inferior vena cava is normal in size  with greater than 50% respiratory variability, suggesting right atrial pressure of 3 mmHg. FINDINGS  Left Ventricle: Left ventricular ejection fraction, by estimation, is 55 to 60%. The left ventricle has normal function. The left ventricle has no regional wall motion abnormalities. The average left ventricular global longitudinal strain is -19.7 %. Strain was performed and the global longitudinal strain is normal. The left ventricular internal cavity size was normal in size. There is no left ventricular hypertrophy. Left ventricular diastolic parameters are consistent with Grade I diastolic dysfunction (impaired relaxation). Right Ventricle: The right ventricular size is normal. No increase in right ventricular wall thickness. Right ventricular systolic function is normal. Left Atrium: Left atrial size was normal in size. Right Atrium: Right atrial size was normal in size. Pericardium: There is no evidence  of pericardial effusion. Mitral Valve: The mitral valve is normal in structure. Mild mitral valve regurgitation. No evidence of mitral valve stenosis. Tricuspid Valve: The tricuspid valve is normal in structure. Tricuspid valve regurgitation is trivial. No evidence of tricuspid stenosis. Aortic Valve: The aortic valve is normal in structure. Aortic valve regurgitation is not visualized. No aortic stenosis is present. Aortic valve peak gradient measures 5.2 mmHg. Pulmonic Valve: The pulmonic valve was normal in structure. Pulmonic valve regurgitation is not visualized. No evidence of pulmonic stenosis. Aorta: The aortic root is normal in size and structure. Venous: The inferior vena cava is normal in size with greater than 50% respiratory variability, suggesting right atrial pressure of 3 mmHg. IAS/Shunts: No atrial level shunt detected by color flow Doppler. Additional Comments: 3D was performed not requiring image post processing on an independent workstation and was indeterminate.  LEFT VENTRICLE PLAX 2D LVIDd:         4.10 cm   Diastology LVIDs:         3.10 cm   LV e' medial:  6.96 cm/s LV PW:         0.80 cm   LV e' lateral: 9.14 cm/s LV IVS:        0.70 cm LVOT diam:     2.40 cm   2D Longitudinal Strain LV SV:         79        2D Strain GLS Avg:     -19.7 % LV SV Index:   43 LVOT Area:     4.52 cm                           3D Volume EF:                          3D EF:        58 %                          LV EDV:       129 ml                          LV ESV:       55 ml                          LV SV:        74 ml RIGHT VENTRICLE             IVC RV S prime:     13.20  cm/s  IVC diam: 1.20 cm TAPSE (M-mode): 2.3 cm LEFT ATRIUM             Index        RIGHT ATRIUM           Index LA diam:        3.30 cm 1.81 cm/m   RA Area:     13.40 cm LA Vol (A2C):   35.3 ml 19.32 ml/m  RA Volume:   27.80 ml  15.21 ml/m LA Vol (A4C):   20.4 ml 11.16 ml/m LA Biplane Vol: 28.9 ml 15.82 ml/m  AORTIC VALVE AV Area (Vmax):  3.59 cm AV Vmax:        114.00 cm/s AV Peak Grad:   5.2 mmHg LVOT Vmax:      90.40 cm/s LVOT Vmean:     56.100 cm/s LVOT VTI:       0.175 m  AORTA Ao Root diam: 3.10 cm Ao Asc diam:  3.30 cm TRICUSPID VALVE TR Peak grad:   7.7 mmHg TR Vmax:        139.00 cm/s  SHUNTS Systemic VTI:  0.18 m Systemic Diam: 2.40 cm Evalene Lunger MD Electronically signed by Evalene Lunger MD Signature Date/Time: 04/26/2024/2:00:19 PM    Final    NM PET Image Initial (PI) Skull Base To Thigh (F-18 FDG) Result Date: 04/25/2024 CLINICAL DATA:  Initial treatment strategy for new diagnosis of lymphoma. Known left pelvic soft tissue mass. EXAM: NUCLEAR MEDICINE PET SKULL BASE TO THIGH TECHNIQUE: 9.4 mCi F-18 FDG was injected intravenously. Full-ring PET imaging was performed from the skull base to thigh after the radiotracer. CT data was obtained and used for attenuation correction and anatomic localization. Fasting blood glucose: 86 mg/dl COMPARISON:  Abdominopelvic CTA and CT of 04/08/2024 FINDINGS: Mediastinal blood pool activity: SUV max 3.5 Liver activity: SUV max 4.0 NECK: Extensive hypermetabolic brown fat throughout the neck and upper chest. Given this limitation, no cervical nodal hypermetabolism identified. Incidental CT findings: Left carotid atherosclerosis. No cervical adenopathy. CHEST: No pulmonary parenchymal or thoracic nodal hypermetabolism. Incidental CT findings: Aortic and coronary artery calcification. Tiny right pleural effusion is new since the prior CTs. ABDOMEN/PELVIS: Mild splenic hypermetabolism relative to the liver (SUV 4.6). No splenomegaly. The posterior left pelvic sidewall mass on prior CTA is hypermetabolic. Difficult to measure on noncontrast CT, estimated at 4.5 x 3.4 cm and a S.U.V. max of 14.1 on 127/6. No separate areas of abdominopelvic nodal hypermetabolism. Incidental CT findings: Left ureteric stent in place, without significant hydronephrosis. SKELETON: No abnormal marrow activity.  Incidental CT findings: Mild degenerative changes of both hips. Bilateral shoulder arthroplasties. IMPRESSION: 1. The posterior left pelvic sidewall mass is hypermetabolic, consistent with active lymphoma. 2. Mild splenic hypermetabolism relative to the liver. No splenomegaly. Cannot exclude lymphomatous involvement. 3. New small right pleural effusion. 4. No active lymphoma within the chest or neck. Mildly decreased sensitivity exam secondary to hypermetabolic brown fat. 5. Age advanced coronary artery atherosclerosis. Recommend assessment of coronary risk factors. 6.  Aortic Atherosclerosis (ICD10-I70.0). Electronically Signed   By: Rockey Kilts M.D.   On: 04/25/2024 08:59    Assessment and plan-   # Sepsis, in the context of chemotherapy induced neutropenia, complicated UTI due to stent Patient has received long-acting G-CSF after cycle 1 day 1 chemotherapy.  No need for short acting G-CSF.  Anticipate ANC to recover in the next few days.  Consider prophylactic antibiotics during future cycles of chemotherapy. CT abdomen pelvis showed decrease  of pelvic mass, indicating good response to treatment.  Rim-enhancing low attenuating focus within the soft tissue mass, suspect due to tumor central necrosis.  Afebrile  Urine culture positive for enterococcus. Continue IV antibiotics- narrowed down to Unasyn per ID recommendation.   Appreciate urology recommendation of stent management Continue oxybutynin and tamsulosin   # Small age-indeterminate pulmonary embolism.  Hold anticoagulation due to thrombocytopenia. Ok to resume and switch to Eliquis when her platelet recovers to > 50,000   # Thrombocytopenia, chemotherapy related.  Monitor. Transfuse irradiated platelet if count drops below 10,000 or active bleeding   # Sciatic pain/neuropathic pain.   Pre existing prior to she got chemotherapy. Left hip/buttock pain has improved with worse of calf and ankle symptoms ? Nerve compression or acute  chemotherapy neuro toxicity Recommend to increase gabapentin  to 300 mg TID  Continue current pain regimen.  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  S/p cycle 1 R-CHOP. Continue allopurinol for tumor lysis prophylaxis. .     Thank you for allowing me to participate in the care of this patient.   Zelphia Cap, MD, PhD Hematology Oncology 05/19/2024

## 2024-05-19 NOTE — Progress Notes (Signed)
 PHARMACY - ANTICOAGULATION CONSULT NOTE  Pharmacy Consult for Heparin  Indication: pulmonary embolus  Allergies  Allergen Reactions   Propofol  Nausea Only    Patient states medication can be tolerated with an antiemetic before taking    Patient Measurements: Height: 5' 4 (162.6 cm) Weight: 72.6 kg (160 lb) IBW/kg (Calculated) : 54.7 HEPARIN DW (KG): 69.6  Vital Signs: Temp: 99 F (37.2 C) (11/07 0813) Temp Source: Oral (11/07 0813) BP: 155/65 (11/07 0813) Pulse Rate: 84 (11/07 0813)  Labs: Recent Labs    05/17/24 1613 05/17/24 2136 05/18/24 0425 05/18/24 1012 05/18/24 1830 05/19/24 0130 05/19/24 1004  HGB 12.5  --  10.6*  --   --  9.1*  --   HCT 35.6*  --  31.1*  --   --  26.7*  --   PLT 58*  --  43*  --   --  26*  --   APTT  --  26  --   --   --   --   --   LABPROT 12.9  --   --   --   --   --   --   INR 0.9  --   --   --   --   --   --   HEPARINUNFRC  --   --  0.44   < > 0.27* 0.33 0.30  CREATININE 0.43*  --  0.44  --   --   --   --   TROPONINIHS 4  --   --   --   --   --   --    < > = values in this interval not displayed.    Estimated Creatinine Clearance: 72.2 mL/min (by C-G formula based on SCr of 0.44 mg/dL).   Medical History: Past Medical History:  Diagnosis Date   Arthritis    left shoulder, neck, lower back  (01/11/2018)   Chronic lower back pain    Family history of adverse reaction to anesthesia    Mother has nausea   GERD (gastroesophageal reflux disease)    PONV (postoperative nausea and vomiting)    Pre-diabetes    UTI (urinary tract infection) 03/20/2021    Medications:  Medications Prior to Admission  Medication Sig Dispense Refill Last Dose/Taking   acetaminophen  (TYLENOL ) 500 MG tablet Take 500 mg by mouth every 6 (six) hours as needed for mild pain (pain score 1-3).   05/17/2024   acyclovir (ZOVIRAX) 400 MG tablet Take 1 tablet (400 mg total) by mouth daily. 30 tablet 3 05/17/2024   allopurinol (ZYLOPRIM) 300 MG tablet Take 1  tablet (300 mg total) by mouth daily. 30 tablet 3 05/17/2024   fentaNYL  (DURAGESIC ) 25 MCG/HR Place 1 patch onto the skin every 3 (three) days. 5 patch 0 Taking   gabapentin  (NEURONTIN ) 100 MG capsule Take 1 capsule (100 mg total) by mouth 3 (three) times daily. 90 capsule 0 05/17/2024   lidocaine -prilocaine (EMLA) cream Apply to affected area once 30 g 3 Unknown   loratadine (CLARITIN) 10 MG tablet Take 10 mg by mouth at bedtime.   05/16/2024 Bedtime   magic mouthwash (multi-ingredient) oral suspension Swish and swallow 5-10 mLs 4 (four) times daily as needed for mouth pain. 480 mL 1 Unknown   methocarbamol  (ROBAXIN ) 500 MG tablet Take 500 mg by mouth every 8 (eight) hours as needed for muscle spasms.  0 Unknown   omeprazole  (PRILOSEC) 20 MG capsule TAKE 1 CAPSULE (20 MG TOTAL) BY MOUTH 2 (TWO)  TIMES DAILY BEFORE A MEAL. 180 capsule 0 Unknown   ondansetron  (ZOFRAN ) 8 MG tablet Take 1 tablet (8 mg total) by mouth every 8 (eight) hours as needed for nausea or vomiting. Start on the third day after cyclophosphamide chemotherapy. 30 tablet 1 Unknown   oxybutynin (DITROPAN-XL) 10 MG 24 hr tablet Take 1 tablet (10 mg total) by mouth daily. 30 tablet 0 05/17/2024   oxyCODONE  (OXY IR/ROXICODONE ) 5 MG immediate release tablet Take 1 tablet (5 mg total) by mouth every 4 (four) hours as needed. 90 tablet 0 Unknown   predniSONE  (DELTASONE ) 20 MG tablet Take 5 tablets (100 mg total) by mouth daily. Take with food on days 2-5 of chemotherapy. 20 tablet 5 Past Month   prochlorperazine (COMPAZINE) 10 MG tablet Take 1 tablet (10 mg total) by mouth every 6 (six) hours as needed for nausea or vomiting. 30 tablet 6 Unknown   sodium phosphate  (FLEET) ENEM Place 133 mLs (1 enema total) rectally daily as needed for severe constipation. 3 mL 0 Unknown   tamsulosin (FLOMAX) 0.4 MG CAPS capsule Take 1 capsule (0.4 mg total) by mouth daily. 30 capsule 0 05/17/2024    Assessment: Pharmacy consulted to dose heparin in this 63 year  old female admitted with PE.  No prior anticoag noted. CrCl = 72.2 ml/min   CT Abd Pelvis: Tiny nonocclusive embolus in the right lower lobe subsegmental branch.   Date Time Results Comments 11/6 0425 HL 0.44 Therapeutic, rate 1150 units/hr 11/6 1012 HL 0.26  Subtherapeutic 11/6 1830 HL 0.27 Subtherapeutic,1250 unuts/hr 11/7     0130    HL 0.33           Therapeutic X 1 , 1400 units/hr  11/7 1004 HL 0.30 Therapeutic x 2, 1400 units/hr   Goal of Therapy:  Heparin level 0.3-0.7 units/ml Monitor platelets by anticoagulation protocol: Yes   Plan: NO BOLUS - okay by Dr Tobie 11/7 1004 HL 0.30- Heparin barely therapeutic. Will increase by 50 units to 1450 units/hr.  Recheck heparin level with AM labs since two therapeutic levels.  - CBC daily per protocol   Estill CHRISTELLA Lutes, PharmD, BCPS Clinical Pharmacist 05/19/2024 10:32 AM

## 2024-05-19 NOTE — Progress Notes (Signed)
 Progress Note    [name]  FMW:[id number] DOB: 10/15/1960  DOA: 05/17/2024 PCP: [name], PA-C      Brief Narrative:    Medical records reviewed and are as summarized below:  Stacey [name] is a 63 y.o. female with medical history significant for diffuse large B-cell lymphoma on chemotherapy followed by Dr. Babara, left ureteral stent, GERD, who presented to the ED because of lower abdominal pain, bilateral low back pain, fever and dysuria.  Of note in September 2025 CT angiogram showed an obstructing mass along the left pelvic sidewall causing hydroureter and hydronephrosis. At that time stent placed by urologist   She was admitted to the hospital for neutropenic fever, sepsis secondary to acute complicated UTI.    Assessment/Plan:   Principal Problem:   Febrile neutropenia Active Problems:   Sepsis (HCC)   Pulmonary embolism (HCC)   Urinary tract infection without hematuria   Neutropenia    Body mass index is 27.46 kg/m.   Sepsis secondary to acute complicated UTI, neutropenic fever, immunocompromised: Continue IV meropenem.  She is on acyclovir prophylaxis.   Left pelvic mass, left hydronephrosis, s/p left ureteral stent in September 2025: She has been evaluated by the urologist and outpatient follow-up with recommended.  Continue Flomax, oxybutynin and Pyridium   Acute right lower lobe pulmonary embolism: Case discussed with Dr. Babara, oncologist.  She recommended discontinuing IV heparin drip because of worsening thrombocytopenia.   Worsening pancytopenia: Heparin drip discontinued as noted above. Recently treated with a long-acting G-CSF after cycle 1 day 1 chemotherapy per oncologist.  Monitor CBC.   Left lower extremity pain, swelling, numbness and tingling: Venous duplex of the lower extremities has been ordered for further evaluation.  This may be related to sciatica   Diffuse large B-cell lymphoma: Recent R-CHOP about a week prior to  admission.  Follow-up with oncologist.   Comorbidities include gout, GERD, osteoarthritis, chronic pain  Diet Order             Diet regular Room service appropriate? Yes; Fluid consistency: Thin  Diet effective now                                  Consultants: ID specialist Oncologist Urologist  Procedures: None    Medications:    acyclovir  400 mg Oral Daily   allopurinol  300 mg Oral Daily   chlorhexidine   15 mL Mouth/Throat BID   Chlorhexidine  Gluconate Cloth  6 each Topical Daily   diclofenac Sodium  2 g Topical QID   feeding supplement  237 mL Oral BID BM   fentaNYL   1 patch Transdermal Q72H   gabapentin   300 mg Oral BID   loratadine  10 mg Oral Daily   oxybutynin  10 mg Oral Daily   pantoprazole  40 mg Oral Daily   phenazopyridine  100 mg Oral TID WC   sodium chloride  flush  10-40 mL Intracatheter Q12H   sodium chloride  flush  3 mL Intravenous Q12H   tamsulosin  0.4 mg Oral Daily   Continuous Infusions:  heparin 1,400 Units/hr (05/18/24 2342)   meropenem (MERREM) IV 1 g (05/19/24 0609)     Anti-infectives (From admission, onward)    Start     Dose/Rate Route Frequency Ordered Stop   05/18/24 2200  meropenem (MERREM) 1 g in sodium chloride  0.9 % 100 mL IVPB  1 g 200 mL/hr over 30 Minutes Intravenous Every 8 hours 05/18/24 2113     05/18/24 1000  acyclovir (ZOVIRAX) 200 MG capsule 400 mg        400 mg Oral Daily 05/18/24 0225     05/17/24 2200  ceFEPIme (MAXIPIME) 2 g in sodium chloride  0.9 % 100 mL IVPB  Status:  Discontinued        2 g 200 mL/hr over 30 Minutes Intravenous Every 8 hours 05/17/24 1953 05/18/24 2113   05/17/24 2130  ceFEPIme (MAXIPIME) 2 g in sodium chloride  0.9 % 100 mL IVPB  Status:  Discontinued        2 g 200 mL/hr over 30 Minutes Intravenous  Once 05/17/24 2043 05/17/24 2045   05/17/24 1615  cefTRIAXone (ROCEPHIN) 2 g in sodium chloride  0.9 % 100 mL IVPB        2 g 200 mL/hr over 30 Minutes Intravenous   Once 05/17/24 1602 05/17/24 1737              Family Communication/Anticipated D/C date and plan/Code Status   DVT prophylaxis: SCDs Start: 05/17/24 2043     Code Status: Full Code  Family Communication: Plan discussed with Medford, husband, at bedside Disposition Plan: Plan to discharge home   Status is: Inpatient Remains inpatient appropriate because: Acute PE, sepsis from complicated UTI       Subjective:   Interval events noted.  She complains of painful urination, left flank pain and left leg pain.  Husband at the bedside.  Objective:    Vitals:   05/18/24 1935 05/19/24 0005 05/19/24 0432 05/19/24 0813  BP: (!) 145/58 (!) 154/65 118/67 (!) 155/65  Pulse: 80 85 88 84  Resp: 16 16 20 20   Temp: 99.3 F (37.4 C) 99.1 F (37.3 C) 99.3 F (37.4 C) 99 F (37.2 C)  TempSrc: Oral Oral  Oral  SpO2: 98% 98% 96% 98%  Weight:      Height:       No data found.   Intake/Output Summary (Last 24 hours) at 05/19/2024 1013 Last data filed at 05/19/2024 0910 Gross per 24 hour  Intake 1264.25 ml  Output 2250 ml  Net -985.75 ml   Filed Weights   05/17/24 1509  Weight: 72.6 kg    Exam:  GEN: NAD SKIN: Warm and dry EYES: No pallor or icterus ENT: MMM CV: RRR PULM: CTA B ABD: soft, ND, NT, +BS CNS: AAO x 3, non focal EXT: Left leg and foot tenderness with mild swelling        Data Reviewed:   I have personally reviewed following labs and imaging studies:  Labs: Labs show the following:   Basic Metabolic Panel: Recent Labs  Lab 05/12/24 1508 05/17/24 1613 05/17/24 2136 05/18/24 0425  NA 134* 130*  --  134*  K 3.7 4.0  --  3.6  CL 101 95*  --  101  CO2 24 24  --  27  GLUCOSE 154* 154*  --  129*  BUN 23 12  --  10  CREATININE 0.54 0.43*  --  0.44  CALCIUM 8.6* 9.0  --  8.4*  MG  --   --  2.0  --   PHOS  --   --  3.3  --    GFR Estimated Creatinine Clearance: 72.2 mL/min (by C-G formula based on SCr of 0.44 mg/dL). Liver Function  Tests: Recent Labs  Lab 05/12/24 1508 05/17/24 1613 05/18/24 0425  AST 20 16  12*  ALT 12 10 7   ALKPHOS 55 64 49  BILITOT 0.7 1.3* 0.9  PROT 7.0 7.5 5.9*  ALBUMIN 3.5 3.9 3.0*   No results for input(s): LIPASE, AMYLASE in the last 168 hours. No results for input(s): AMMONIA in the last 168 hours. Coagulation profile Recent Labs  Lab 05/17/24 1613  INR 0.9    CBC: Recent Labs  Lab 05/12/24 1508 05/17/24 1613 05/18/24 0425 05/19/24 0130  WBC 5.1 0.1* 0.3* 0.2*  NEUTROABS 4.8 0.0*  --   --   HGB 12.4 12.5 10.6* 9.1*  HCT 35.8* 35.6* 31.1* 26.7*  MCV 89.5 88.1 90.7 89.9  PLT 189 58* 43* 26*   Cardiac Enzymes: No results for input(s): CKTOTAL, CKMB, CKMBINDEX, TROPONINI in the last 168 hours. BNP (last 3 results) No results for input(s): PROBNP in the last 8760 hours. CBG: No results for input(s): GLUCAP in the last 168 hours. D-Dimer: No results for input(s): DDIMER in the last 72 hours. Hgb A1c: No results for input(s): HGBA1C in the last 72 hours. Lipid Profile: No results for input(s): CHOL, HDL, LDLCALC, TRIG, CHOLHDL, LDLDIRECT in the last 72 hours. Thyroid function studies: No results for input(s): TSH, T4TOTAL, T3FREE, THYROIDAB in the last 72 hours.  Invalid input(s): FREET3 Anemia work up: No results for input(s): VITAMINB12, FOLATE, FERRITIN, TIBC, IRON, RETICCTPCT in the last 72 hours. Sepsis Labs: Recent Labs  Lab 05/12/24 1508 05/17/24 1613 05/17/24 1917 05/18/24 0425 05/19/24 0130  WBC 5.1 0.1*  --  0.3* 0.2*  LATICACIDVEN  --  1.7 0.9  --   --     Microbiology Recent Results (from the past 240 hours)  Urine Culture     Status: None (Preliminary result)   Collection Time: 05/17/24  3:58 PM   Specimen: Urine, Clean Catch  Result Value Ref Range Status   Specimen Description   Final    URINE, CLEAN CATCH Performed at [facility], [address], [place], [place] [address]     Special Requests   Final    NONE Reflexed from [id number] Performed at [facility], [address] [place] [place], [place] [address]    Culture   Final    CULTURE REINCUBATED FOR BETTER GROWTH Performed at [facility], [address], [place], [place] [address]    Report Status PENDING  Incomplete  Blood Culture (routine x 2)     Status: None (Preliminary result)   Collection Time: 05/17/24  4:13 PM   Specimen: BLOOD  Result Value Ref Range Status   Specimen Description BLOOD BLOOD RIGHT FOREARM  Final   Special Requests   Final    BOTTLES DRAWN AEROBIC AND ANAEROBIC Blood Culture results may not be optimal due to an inadequate volume of blood received in culture bottles   Culture   Final    NO GROWTH 2 DAYS Performed at [facility], [address] [place], [place] [address]    Report Status PENDING  Incomplete  Blood Culture (routine x 2)     Status: None (Preliminary result)   Collection Time: 05/17/24  4:13 PM   Specimen: BLOOD  Result Value Ref Range Status   Specimen Description BLOOD LEFT ANTECUBITAL  Final   Special Requests   Final    BOTTLES DRAWN AEROBIC AND ANAEROBIC Blood Culture results may not be optimal due to an inadequate volume of blood received in culture bottles   Culture   Final    NO GROWTH 2  DAYS Performed at [facility], [address] [place], [place] [address]    Report Status PENDING  Incomplete  Resp panel by RT-PCR (RSV, Flu A&B, Covid) Anterior Nasal Swab     Status: None   Collection Time: 05/17/24  8:09 PM   Specimen: Anterior Nasal Swab  Result Value Ref Range Status   SARS Coronavirus 2 by RT PCR NEGATIVE NEGATIVE Final    Comment: (NOTE) SARS-CoV-2 target nucleic acids are NOT DETECTED.  The SARS-CoV-2 RNA is generally detectable in upper respiratory specimens during the acute phase of infection. The lowest concentration of SARS-CoV-2 viral copies this assay can detect is 138 copies/mL. A negative result  does not preclude SARS-Cov-2 infection and should not be used as the sole basis for treatment or other patient management decisions. A negative result may occur with  improper specimen collection/handling, submission of specimen other than nasopharyngeal swab, presence of viral mutation(s) within the areas targeted by this assay, and inadequate number of viral copies(<138 copies/mL). A negative result must be combined with clinical observations, patient history, and epidemiological information. The expected result is Negative.  Fact Sheet for Patients:  bloggercourse.com  Fact Sheet for Healthcare Providers:  seriousbroker.it  This test is no t yet approved or cleared by the [place]  FDA and  has been authorized for detection and/or diagnosis of SARS-CoV-2 by FDA under an Emergency Use Authorization (EUA). This EUA will remain  in effect (meaning this test can be used) for the duration of the COVID-19 declaration under Section 564(b)(1) of the Act, 21 U.S.C.section 360bbb-3(b)(1), unless the authorization is terminated  or revoked sooner.       Influenza A by PCR NEGATIVE NEGATIVE Final   Influenza B by PCR NEGATIVE NEGATIVE Final    Comment: (NOTE) The Xpert Xpress SARS-CoV-2/FLU/RSV plus assay is intended as an aid in the diagnosis of influenza from Nasopharyngeal swab specimens and should not be used as a sole basis for treatment. Nasal washings and aspirates are unacceptable for Xpert Xpress SARS-CoV-2/FLU/RSV testing.  Fact Sheet for Patients: bloggercourse.com  Fact Sheet for Healthcare Providers: seriousbroker.it  This test is not yet approved or cleared by the [place]  FDA and has been authorized for detection and/or diagnosis of SARS-CoV-2 by FDA under an Emergency Use Authorization (EUA). This EUA will remain in effect (meaning this test can be used) for  the duration of the COVID-19 declaration under Section 564(b)(1) of the Act, 21 U.S.C. section 360bbb-3(b)(1), unless the authorization is terminated or revoked.     Resp Syncytial Virus by PCR NEGATIVE NEGATIVE Final    Comment: (NOTE) Fact Sheet for Patients: bloggercourse.com  Fact Sheet for Healthcare Providers: seriousbroker.it  This test is not yet approved or cleared by the [place]  FDA and has been authorized for detection and/or diagnosis of SARS-CoV-2 by FDA under an Emergency Use Authorization (EUA). This EUA will remain in effect (meaning this test can be used) for the duration of the COVID-19 declaration under Section 564(b)(1) of the Act, 21 U.S.C. section 360bbb-3(b)(1), unless the authorization is terminated or revoked.  Performed at [facility], [address] [place], [place] [address]     Procedures and diagnostic studies:  CT Angio Chest Pulmonary Embolism (PE) W or WO Contrast Result Date: 05/18/2024 CLINICAL DATA:  Concern for pulmonary embolism.  Lymphoma. EXAM: CT ANGIOGRAPHY CHEST WITH CONTRAST TECHNIQUE: Multidetector CT imaging of the chest was performed using the standard protocol during bolus administration of intravenous contrast. Multiplanar CT image reconstructions  and MIPs were obtained to evaluate the vascular anatomy. RADIATION DOSE REDUCTION: This exam was performed according to the departmental dose-optimization program which includes automated exposure control, adjustment of the mA and/or kV according to patient size and/or use of iterative reconstruction technique. CONTRAST:  50mL OMNIPAQUE IOHEXOL 350 MG/ML SOLN COMPARISON:  Chest radiograph dated 05/17/2024. FINDINGS: Cardiovascular: There is no cardiomegaly or pericardial effusion. The thoracic aorta is unremarkable. The origins of the great vessels of the aortic arch are patent. Right-sided Port-A-Cath with tip in the right  atrium at the cavoatrial junction. Linear nonocclusive right lower lobe subsegmental pulmonary artery embolus, age indeterminate. No CT evidence of right heart straining. Mediastinum/Nodes: No hilar or mediastinal adenopathy. The esophagus and the thyroid gland are grossly unremarkable no mediastinal fluid collection. Lungs/Pleura: Minimal bibasilar linear atelectasis. No focal consolidation, pleural effusion, or pneumothorax. The central airways are patent. Upper Abdomen: No acute abnormality. Musculoskeletal: Degenerative changes of spine. No acute osseous pathology. Bilateral shoulder arthroplasties. Review of the MIP images confirms the above findings. IMPRESSION: 1. Age indeterminate linear nonocclusive right lower lobe subsegmental pulmonary artery embolus. No CT evidence of right heart straining. 2. No focal consolidation. These results will be called to the ordering clinician or representative by the Radiologist Assistant, and communication documented in the PACS or [employer]. Electronically Signed   By: Vanetta Chou M.D.   On: 05/18/2024 14:56   CT ABDOMEN PELVIS W CONTRAST Result Date: 05/17/2024 CLINICAL DATA:  Abdominal pain.  Concern for kidney stone. EXAM: CT ABDOMEN AND PELVIS WITH CONTRAST TECHNIQUE: Multidetector CT imaging of the abdomen and pelvis was performed using the standard protocol following bolus administration of intravenous contrast. RADIATION DOSE REDUCTION: This exam was performed according to the departmental dose-optimization program which includes automated exposure control, adjustment of the mA and/or kV according to patient size and/or use of iterative reconstruction technique. CONTRAST:  100mL OMNIPAQUE IOHEXOL 300 MG/ML  SOLN COMPARISON:  CT abdomen pelvis dated 04/08/2024. FINDINGS: Lower chest: The visualized lung bases are clear. Tiny nonocclusive embolus in the right lower lobe subsegmental branch (2/2). Chest CT may provide better evaluation. No  intra-abdominal free air or free fluid. Hepatobiliary: The liver is unremarkable. No biliary dilatation. The gallbladder is unremarkable. Pancreas: Unremarkable. No pancreatic ductal dilatation or surrounding inflammatory changes. Spleen: Normal in size without focal abnormality. Adrenals/Urinary Tract: The adrenal glands unremarkable. Left ureteral stent with proximal tip in the inferior pole collecting system and distal end in the urinary bladder. There is mild left hydronephrosis. There is urothelial enhancement of the left renal collecting system and ureter suggestive of ascending UTI. No stone identified. The right kidney, right ureter, and urinary bladder appear unremarkable. Stomach/Bowel: Moderate stool throughout the colon. There is no bowel obstruction or active inflammation. Appendectomy. Vascular/Lymphatic: The abdominal aorta and IVC unremarkable. No portal venous gas. There is no adenopathy. Reproductive: The uterus is grossly unremarkable. Other: Interval decrease in the masslike soft tissues thickening of the left pelvic sidewall measuring approximately 5.8 x 3.3 cm in greatest axial dimensions (previously 6.7 x 4.8 cm). A 1.4 x 1.2 cm rim enhancing low attenuating focus within this soft tissue mass (coronal 52/5) suspicious for an area of phlegmonous change or developing abscess. Musculoskeletal: Degenerative changes of the spine. No acute osseous pathology. IMPRESSION: 1. Left ureteral stent with mild left hydronephrosis and findings suggestive of ascending UTI. Correlation with urinalysis recommended. 2. Interval decrease in the masslike soft tissues thickening of the left pelvic sidewall. A 1.4 x 1.2 cm rim enhancing  low attenuating focus within this soft tissue mass suspicious for an area of phlegmonous change or developing abscess. 3. Tiny nonocclusive embolus in the right lower lobe subsegmental branch. Chest CT may provide better evaluation. These results were called by telephone at the time  of interpretation on 05/17/2024 at 6:28 pm to provider LAMAR PRICE , who verbally acknowledged these results. Electronically Signed   By: Vanetta Chou M.D.   On: 05/17/2024 18:39   DG Chest Port 1 View Result Date: 05/17/2024 CLINICAL DATA:  Sepsis. EXAM: PORTABLE CHEST 1 VIEW COMPARISON:  Chest radiograph dated 09/17/2012 FINDINGS: Right-sided PICC with tip over central SVC. No focal consolidation, pleural effusion, pneumothorax. The cardiac silhouette is within limits. No acute osseous pathology. Bilateral shoulder arthroplasties. IMPRESSION: No active disease. Electronically Signed   By: Vanetta Chou M.D.   On: 05/17/2024 16:38               LOS: 2 days   Lissie Hinesley  [facility]   Pager on [email or web address]. If 7PM-7AM, please contact night-coverage at [email or web address]     05/19/2024, 10:13 AM

## 2024-05-19 NOTE — TOC Initial Note (Signed)
 Transition of Care [facility]) - Initial/Assessment Note    Patient Details  Name: Stacey [name] MRN: [id number] Date of Birth: [birth year]  Transition [facility] CM/SW Contact:    Daved JONETTA Hamilton, RN Phone Number: 05/19/2024, 6:13 PM  Clinical Narrative:                  Met with patient, introduced self and explained role in discharge planning and discussed therapy recommendations of HH once medically ready for hospital discharge. Patient spouse Medford present at bedside.  Patient verbalized she was completely independent prior to hospital admission to include driving self. Patient verbalized she understands therapy recommendations however, she has resources to assist her once she returns home and would like to decline any HH at this time. Patient does verbalize she is interested in getting a rolling walker as the one she has now is borrowed. Patient also verbalized it would be of great benefit if we could order a shower chair for her at home. This CM verbalized I will inquire about these items. Patient and spouse verbalized understanding.    Expected Discharge Plan: Home/Self Care Barriers to Discharge: Continued Medical Work up   Patient Goals and CMS Choice            Expected Discharge Plan and Services   Discharge Planning Services: CM Consult   Living arrangements for the past 2 months: Single Family Home                                      Prior Living Arrangements/Services Living arrangements for the past 2 months: Single Family Home Lives with:: Spouse Patient language and need for interpreter reviewed:: Yes Do you feel safe going back to the place where you live?: Yes      Need for Family Participation in Patient Care: Yes (Comment) Care giver support system in place?: Yes (comment)   Criminal Activity/Legal Involvement Pertinent to Current Situation/Hospitalization: No - Comment as needed  Activities of Daily Living   ADL Screening (condition at  time of admission) Independently performs ADLs?: No Does the patient have a NEW difficulty with bathing/dressing/toileting/self-feeding that is expected to last >3 days?: Yes (Initiates electronic notice to provider for possible OT consult) Does the patient have a NEW difficulty with getting in/out of bed, walking, or climbing stairs that is expected to last >3 days?: Yes (Initiates electronic notice to provider for possible PT consult) Does the patient have a NEW difficulty with communication that is expected to last >3 days?: No Is the patient deaf or have difficulty hearing?: No Does the patient have difficulty seeing, even when wearing glasses/contacts?: No Does the patient have difficulty concentrating, remembering, or making decisions?: No  Permission Sought/Granted Permission sought to share information with : Family Supports Permission granted to share information with : Yes, Verbal Permission Granted  Share Information with NAME: Tajia Szeliga     Permission granted to share info w Relationship: Spouse  Permission granted to share info w Contact Information: [phone]  Emotional Assessment Appearance:: Appears stated age, Well-Groomed Attitude/Demeanor/Rapport: Engaged Affect (typically observed): Appropriate Orientation: : Oriented to Self, Oriented to Place, Oriented to  Time, Oriented to Situation Alcohol / Substance Use: Not Applicable Psych Involvement: No (comment)  Admission diagnosis:  Febrile neutropenia [D70.9, R50.81] Neutropenic fever [D70.9, R50.81] Urinary tract infection without hematuria, site unspecified [N39.0] Pulmonary embolism, other, unspecified chronicity, unspecified whether acute cor pulmonale  present [facility]) [I26.99] Patient Active Problem List   Diagnosis Date Noted   Sepsis (HCC) 05/18/2024   Pulmonary embolism (HCC) 05/18/2024   Urinary tract infection without hematuria 05/18/2024   Neutropenia 05/18/2024   Febrile neutropenia 05/17/2024    Neoplasm related pain 05/10/2024   Encounter for antineoplastic chemotherapy 05/10/2024   Diffuse large B-cell lymphoma (HCC) 04/26/2024   Pre-diabetes    Hydronephrosis of left kidney 04/08/2024   AKI (acute kidney injury) 04/08/2024   Hx of total shoulder replacement, right 02/08/2020   Osteoarthritis of left shoulder 01/11/2018   S/P shoulder replacement, left 01/11/2018   PCP:  [name], PA-C Pharmacy:   [facility] [phone] - [place], [place] - [address] AT [facility] [address] [place] [address] Phone: [phone] Fax: [phone]  [facility] [address] [place] [address] Phone: [phone] Fax: [phone]     Social Drivers of Health (SDOH) Social History: SDOH Screenings   Food Insecurity: No Food Insecurity (05/17/2024)  Housing: Low Risk  (05/17/2024)  Transportation Needs: No Transportation Needs (05/17/2024)  Utilities: Not At Risk (05/17/2024)  Depression (PHQ2-9): Low Risk  (05/12/2024)  Financial Resource Strain: Low Risk  (03/30/2024)   Received from [facility]  Social Connections: Socially Integrated (05/17/2024)  Tobacco Use: Low Risk  (05/17/2024)   SDOH Interventions:     Readmission Risk Interventions     No data to display

## 2024-05-19 NOTE — Plan of Care (Signed)

## 2024-05-19 NOTE — Progress Notes (Signed)
 PHARMACY - ANTICOAGULATION CONSULT NOTE  Pharmacy Consult for Heparin  Indication: pulmonary embolus  Allergies  Allergen Reactions   Propofol  Nausea Only    Patient states medication can be tolerated with an antiemetic before taking    Patient Measurements: Height: 5' 4 (162.6 cm) Weight: 72.6 kg (160 lb) IBW/kg (Calculated) : 54.7 HEPARIN DW (KG): 69.6  Vital Signs: Temp: 99.1 F (37.3 C) (11/07 0005) Temp Source: Oral (11/07 0005) BP: 154/65 (11/07 0005) Pulse Rate: 85 (11/07 0005)  Labs: Recent Labs    05/17/24 1613 05/17/24 2136 05/18/24 0425 05/18/24 0425 05/18/24 1012 05/18/24 1830 05/19/24 0130  HGB 12.5  --  10.6*  --   --   --   --   HCT 35.6*  --  31.1*  --   --   --   --   PLT 58*  --  43*  --   --   --   --   APTT  --  26  --   --   --   --   --   LABPROT 12.9  --   --   --   --   --   --   INR 0.9  --   --   --   --   --   --   HEPARINUNFRC  --   --  0.44   < > 0.26* 0.27* 0.33  CREATININE 0.43*  --  0.44  --   --   --   --   TROPONINIHS 4  --   --   --   --   --   --    < > = values in this interval not displayed.    Estimated Creatinine Clearance: 72.2 mL/min (by C-G formula based on SCr of 0.44 mg/dL).   Medical History: Past Medical History:  Diagnosis Date   Arthritis    left shoulder, neck, lower back  (01/11/2018)   Chronic lower back pain    Family history of adverse reaction to anesthesia    Mother has nausea   GERD (gastroesophageal reflux disease)    PONV (postoperative nausea and vomiting)    Pre-diabetes    UTI (urinary tract infection) 03/20/2021    Medications:  Medications Prior to Admission  Medication Sig Dispense Refill Last Dose/Taking   acetaminophen  (TYLENOL ) 500 MG tablet Take 500 mg by mouth every 6 (six) hours as needed for mild pain (pain score 1-3).   05/17/2024   acyclovir (ZOVIRAX) 400 MG tablet Take 1 tablet (400 mg total) by mouth daily. 30 tablet 3 05/17/2024   allopurinol (ZYLOPRIM) 300 MG tablet Take 1  tablet (300 mg total) by mouth daily. 30 tablet 3 05/17/2024   fentaNYL  (DURAGESIC ) 25 MCG/HR Place 1 patch onto the skin every 3 (three) days. 5 patch 0 Taking   gabapentin  (NEURONTIN ) 100 MG capsule Take 1 capsule (100 mg total) by mouth 3 (three) times daily. 90 capsule 0 05/17/2024   lidocaine -prilocaine (EMLA) cream Apply to affected area once 30 g 3 Unknown   loratadine (CLARITIN) 10 MG tablet Take 10 mg by mouth at bedtime.   05/16/2024 Bedtime   magic mouthwash (multi-ingredient) oral suspension Swish and swallow 5-10 mLs 4 (four) times daily as needed for mouth pain. 480 mL 1 Unknown   methocarbamol  (ROBAXIN ) 500 MG tablet Take 500 mg by mouth every 8 (eight) hours as needed for muscle spasms.  0 Unknown   omeprazole  (PRILOSEC) 20 MG capsule TAKE 1 CAPSULE (20  MG TOTAL) BY MOUTH 2 (TWO) TIMES DAILY BEFORE A MEAL. 180 capsule 0 Unknown   ondansetron  (ZOFRAN ) 8 MG tablet Take 1 tablet (8 mg total) by mouth every 8 (eight) hours as needed for nausea or vomiting. Start on the third day after cyclophosphamide chemotherapy. 30 tablet 1 Unknown   oxybutynin (DITROPAN-XL) 10 MG 24 hr tablet Take 1 tablet (10 mg total) by mouth daily. 30 tablet 0 05/17/2024   oxyCODONE  (OXY IR/ROXICODONE ) 5 MG immediate release tablet Take 1 tablet (5 mg total) by mouth every 4 (four) hours as needed. 90 tablet 0 Unknown   predniSONE  (DELTASONE ) 20 MG tablet Take 5 tablets (100 mg total) by mouth daily. Take with food on days 2-5 of chemotherapy. 20 tablet 5 Past Month   prochlorperazine (COMPAZINE) 10 MG tablet Take 1 tablet (10 mg total) by mouth every 6 (six) hours as needed for nausea or vomiting. 30 tablet 6 Unknown   sodium phosphate  (FLEET) ENEM Place 133 mLs (1 enema total) rectally daily as needed for severe constipation. 3 mL 0 Unknown   tamsulosin (FLOMAX) 0.4 MG CAPS capsule Take 1 capsule (0.4 mg total) by mouth daily. 30 capsule 0 05/17/2024    Assessment: Pharmacy consulted to dose heparin in this 63 year  old female admitted with PE.  No prior anticoag noted. CrCl = 72.2 ml/min   CT Abd Pelvis: Tiny nonocclusive embolus in the right lower lobe subsegmental branch.   Date Time Results Comments 11/6 0425 HL 0.44 Therapeutic, rate 1150 units/hr 11/6 1012 HL 0.26  Subtherapeutic 11/6 1830 HL 0.27 Subtherapeutic,1250 unuts/hr 11/7     0130    HL 0.33           Therapeutic X 1 , 1400 units/hr   Goal of Therapy:  Heparin level 0.3-0.7 units/ml Monitor platelets by anticoagulation protocol: Yes   Plan: NO BOLUS - okay by Dr Tobie 11/7: HL @ 0130 = 0.33, therapeutic X 1 - Will continue pt on current rate and recheck HL in 6 hrs.  - CBC daily   Zeke Aker D 05/19/2024 2:27 AM

## 2024-05-20 DIAGNOSIS — D61818 Other pancytopenia: Secondary | ICD-10-CM | POA: Diagnosis not present

## 2024-05-20 LAB — RENAL FUNCTION PANEL
Albumin: 2.7 g/dL — ABNORMAL LOW (ref 3.5–5.0)
Anion gap: 8 (ref 5–15)
BUN: 12 mg/dL (ref 8–23)
CO2: 28 mmol/L (ref 22–32)
Calcium: 8.2 mg/dL — ABNORMAL LOW (ref 8.9–10.3)
Chloride: 101 mmol/L (ref 98–111)
Creatinine, Ser: 0.51 mg/dL (ref 0.44–1.00)
GFR, Estimated: >60 mL/min (ref >60)
Glucose, Bld: 107 mg/dL — ABNORMAL HIGH (ref 70–99)
Phosphorus: 2.8 mg/dL (ref 2.5–4.6)
Potassium: 3.4 mmol/L — ABNORMAL LOW (ref 3.5–5.1)
Sodium: 137 mmol/L (ref 135–145)

## 2024-05-20 LAB — CBC
HCT: 27.7 % — ABNORMAL LOW (ref 36.0–46.0)
Hemoglobin: 9.2 g/dL — ABNORMAL LOW (ref 12.0–15.0)
MCH: 30.4 pg (ref 26.0–34.0)
MCHC: 33.2 g/dL (ref 30.0–36.0)
MCV: 91.4 fL (ref 80.0–100.0)
Platelets: 28 K/uL — CL (ref 150–400)
RBC: 3.03 MIL/uL — ABNORMAL LOW (ref 3.87–5.11)
RDW: 12.4 % (ref 11.5–15.5)
WBC: 0.4 K/uL — CL (ref 4.0–10.5)
nRBC: 0 % (ref 0.0–0.2)

## 2024-05-20 LAB — URINE CULTURE: Culture: >=100000 — AB

## 2024-05-20 LAB — URIC ACID: Uric Acid, Serum: <1.5 mg/dL — ABNORMAL LOW (ref 2.5–7.1)

## 2024-05-20 LAB — HEPARIN LEVEL (UNFRACTIONATED): Heparin Unfractionated: <0.1 [IU]/mL — ABNORMAL LOW (ref 0.30–0.70)

## 2024-05-20 NOTE — TOC CM/SW Note (Cosign Needed)
 The beneficiary is confined to 1 level of the House environment & there is no shower chair on that level . Also pt is ordered a RW to help aid with mobility and safety per MD.

## 2024-05-20 NOTE — Plan of Care (Signed)
  Problem: Education: Goal: Knowledge of General Education information will improve Description: Including pain rating scale, medication(s)/side effects and non-pharmacologic comfort measures Outcome: Progressing   Problem: Clinical Measurements: Goal: Will remain free from infection Outcome: Progressing Goal: Respiratory complications will improve Outcome: Progressing   Problem: Activity: Goal: Risk for activity intolerance will decrease Outcome: Progressing   Problem: Coping: Goal: Level of anxiety will decrease Outcome: Progressing

## 2024-05-20 NOTE — Progress Notes (Signed)
 PROGRESS NOTE    Stacey [name]  FMW:[id number] DOB: [birth year] DOA: 05/17/2024 PCP: [name], PA-C   Assessment & Plan:   Principal Problem:   Febrile neutropenia Active Problems:   Sepsis (HCC)   Pulmonary embolism (HCC)   Urinary tract infection without hematuria   Neutropenia  Assessment and Plan: Sepsis: secondary to acute complicated UTI, neutropenic fever. See Dr. Rosilyn note on how pt met sepsis criteria. Continue on IV unasyn. Continue on acyclovir for anti-viral prophylaxis.  UTI: urine cx growing enterococcus faecalis. Continue on IV unasyn   Left pelvic mass: w/ left hydronephrosis & s/p left ureteral stent in September 2025.  Continue on flomax, oxybutynin & pyridium. Will f/u outpatient w/ uro    Acute right lower lobe pulmonary embolism: case discussed with Dr. Babara, oncologist.  Dr. Babara recommended discontinuing IV heparin drip because of worsening thrombocytopenia. US  of b/l LE neg for DVT but would consider IVC filter in future secondary to high risk of DVTs and/or recurrent PE. Unsure if pt would be a candidate currently for IVC filter b/c of thrombocytopenia and no LE DVT   Pancytopenia: likely secondary to diffuse large B cell lymphoma & recent chemo. S/p long-acting G-CSF. Will transfuse prn    Diffuse large B-cell lymphoma: recent R-CHOP about a week prior to admission. Onco following and recs apprec  Hx of gout: continue on home dose of allopurinol  GERD: continue on PPI     DVT prophylaxis: SCDs Code Status: full  Family Communication: discussed pt's care w/ pt's family at bedside and answered their questions  Disposition Plan: likely d/c home w/ HH   Level of care: Telemetry  Status is: Inpatient Remains inpatient appropriate because: severity of illness, pancytopenia still    Consultants:  Onco   Procedures:   Antimicrobials: unasyn    Subjective: Pt c/o burning with urination   Objective: Vitals:   05/19/24 1944 05/19/24  2310 05/20/24 0333 05/20/24 0745  BP: 139/64 (!) 145/67 (!) 150/62 (!) 132/51  Pulse: 87 88 87 81  Resp: 16 16 16 18   Temp: 98.9 F (37.2 C) 98.3 F (36.8 C) 99.2 F (37.3 C) 99.2 F (37.3 C)  TempSrc: Axillary     SpO2: 94% 96% 97% 98%  Weight:      Height:        Intake/Output Summary (Last 24 hours) at 05/20/2024 0850 Last data filed at 05/20/2024 0335 Gross per 24 hour  Intake 558.78 ml  Output 800 ml  Net -241.22 ml   Filed Weights   05/17/24 1509  Weight: 72.6 kg    Examination:  General exam: Appears calm and comfortable  Respiratory system: decreased breath sounds b/l  Cardiovascular system: S1 & S2 +. No  rubs, gallops or clicks.  Gastrointestinal system: Abdomen is nondistended, soft and nontender.  Normal bowel sounds heard. Central nervous system: Alert and oriented. Moves all extremities Psychiatry: Judgement and insight appear normal. Flat mood and affect    Data Reviewed: I have personally reviewed following labs and imaging studies  CBC: Recent Labs  Lab 05/17/24 1613 05/18/24 0425 05/19/24 0130 05/20/24 0531  WBC 0.1* 0.3* 0.2* 0.4*  NEUTROABS 0.0*  --   --   --   HGB 12.5 10.6* 9.1* 9.2*  HCT 35.6* 31.1* 26.7* 27.7*  MCV 88.1 90.7 89.9 91.4  PLT 58* 43* 26* 28*   Basic Metabolic Panel: Recent Labs  Lab 05/17/24 1613 05/17/24 2136 05/18/24 0425 05/20/24 0531  NA 130*  --  134* 137  K 4.0  --  3.6 3.4*  CL 95*  --  101 101  CO2 24  --  27 28  GLUCOSE 154*  --  129* 107*  BUN 12  --  10 12  CREATININE 0.43*  --  0.44 0.51  CALCIUM 9.0  --  8.4* 8.2*  MG  --  2.0  --   --   PHOS  --  3.3  --  2.8   GFR: Estimated Creatinine Clearance: 72.2 mL/min (by C-G formula based on SCr of 0.51 mg/dL). Liver Function Tests: Recent Labs  Lab 05/17/24 1613 05/18/24 0425 05/20/24 0531  AST 16 12*  --   ALT 10 7  --   ALKPHOS 64 49  --   BILITOT 1.3* 0.9  --   PROT 7.5 5.9*  --   ALBUMIN 3.9 3.0* 2.7*   No results for input(s):  LIPASE, AMYLASE in the last 168 hours. No results for input(s): AMMONIA in the last 168 hours. Coagulation Profile: Recent Labs  Lab 05/17/24 1613  INR 0.9   Cardiac Enzymes: No results for input(s): CKTOTAL, CKMB, CKMBINDEX, TROPONINI in the last 168 hours. BNP (last 3 results) No results for input(s): PROBNP in the last 8760 hours. HbA1C: No results for input(s): HGBA1C in the last 72 hours. CBG: No results for input(s): GLUCAP in the last 168 hours. Lipid Profile: No results for input(s): CHOL, HDL, LDLCALC, TRIG, CHOLHDL, LDLDIRECT in the last 72 hours. Thyroid Function Tests: No results for input(s): TSH, T4TOTAL, FREET4, T3FREE, THYROIDAB in the last 72 hours. Anemia Panel: No results for input(s): VITAMINB12, FOLATE, FERRITIN, TIBC, IRON, RETICCTPCT in the last 72 hours. Sepsis Labs: Recent Labs  Lab 05/17/24 1613 05/17/24 1917  LATICACIDVEN 1.7 0.9    Recent Results (from the past 240 hours)  Urine Culture     Status: Abnormal   Collection Time: 05/17/24  3:58 PM   Specimen: Urine, Clean Catch  Result Value Ref Range Status   Specimen Description   Final    URINE, CLEAN CATCH Performed at [facility], [address], [place], [place] [address]    Special Requests   Final    NONE Reflexed from [id number] Performed at [facility], [address] [place], [place] [address]    Culture >=100,000 COLONIES/mL ENTEROCOCCUS FAECALIS (A)  Final   Report Status 05/20/2024 FINAL  Final   Organism ID, Bacteria ENTEROCOCCUS FAECALIS (A)  Final      Susceptibility   Enterococcus faecalis - MIC*    AMPICILLIN <=2 SENSITIVE Sensitive     NITROFURANTOIN <=16 SENSITIVE Sensitive     VANCOMYCIN  1 SENSITIVE Sensitive     * >=100,000 COLONIES/mL ENTEROCOCCUS FAECALIS  Blood Culture (routine x 2)     Status: None (Preliminary result)   Collection Time: 05/17/24  4:13 PM   Specimen: BLOOD  Result Value Ref  Range Status   Specimen Description BLOOD BLOOD RIGHT FOREARM  Final   Special Requests   Final    BOTTLES DRAWN AEROBIC AND ANAEROBIC Blood Culture results may not be optimal due to an inadequate volume of blood received in culture bottles   Culture   Final    NO GROWTH 3 DAYS Performed at [facility], [address], [place], [place] [address]    Report Status PENDING  Incomplete  Blood Culture (routine x 2)     Status: None (Preliminary result)   Collection Time: 05/17/24  4:13 PM  Specimen: BLOOD  Result Value Ref Range Status   Specimen Description BLOOD LEFT ANTECUBITAL  Final   Special Requests   Final    BOTTLES DRAWN AEROBIC AND ANAEROBIC Blood Culture results may not be optimal due to an inadequate volume of blood received in culture bottles   Culture   Final    NO GROWTH 3 DAYS Performed at [facility], [address] [place], [place] [address]    Report Status PENDING  Incomplete  Resp panel by RT-PCR (RSV, Flu A&B, Covid) Anterior Nasal Swab     Status: None   Collection Time: 05/17/24  8:09 PM   Specimen: Anterior Nasal Swab  Result Value Ref Range Status   SARS Coronavirus 2 by RT PCR NEGATIVE NEGATIVE Final    Comment: (NOTE) SARS-CoV-2 target nucleic acids are NOT DETECTED.  The SARS-CoV-2 RNA is generally detectable in upper respiratory specimens during the acute phase of infection. The lowest concentration of SARS-CoV-2 viral copies this assay can detect is 138 copies/mL. A negative result does not preclude SARS-Cov-2 infection and should not be used as the sole basis for treatment or other patient management decisions. A negative result may occur with  improper specimen collection/handling, submission of specimen other than nasopharyngeal swab, presence of viral mutation(s) within the areas targeted by this assay, and inadequate number of viral copies(<138 copies/mL). A negative result must be combined with clinical observations,  patient history, and epidemiological information. The expected result is Negative.  Fact Sheet for Patients:  bloggercourse.com  Fact Sheet for Healthcare Providers:  seriousbroker.it  This test is no t yet approved or cleared by the [place]  FDA and  has been authorized for detection and/or diagnosis of SARS-CoV-2 by FDA under an Emergency Use Authorization (EUA). This EUA will remain  in effect (meaning this test can be used) for the duration of the COVID-19 declaration under Section 564(b)(1) of the Act, 21 U.S.C.section 360bbb-3(b)(1), unless the authorization is terminated  or revoked sooner.       Influenza A by PCR NEGATIVE NEGATIVE Final   Influenza B by PCR NEGATIVE NEGATIVE Final    Comment: (NOTE) The Xpert Xpress SARS-CoV-2/FLU/RSV plus assay is intended as an aid in the diagnosis of influenza from Nasopharyngeal swab specimens and should not be used as a sole basis for treatment. Nasal washings and aspirates are unacceptable for Xpert Xpress SARS-CoV-2/FLU/RSV testing.  Fact Sheet for Patients: bloggercourse.com  Fact Sheet for Healthcare Providers: seriousbroker.it  This test is not yet approved or cleared by the [place]  FDA and has been authorized for detection and/or diagnosis of SARS-CoV-2 by FDA under an Emergency Use Authorization (EUA). This EUA will remain in effect (meaning this test can be used) for the duration of the COVID-19 declaration under Section 564(b)(1) of the Act, 21 U.S.C. section 360bbb-3(b)(1), unless the authorization is terminated or revoked.     Resp Syncytial Virus by PCR NEGATIVE NEGATIVE Final    Comment: (NOTE) Fact Sheet for Patients: bloggercourse.com  Fact Sheet for Healthcare Providers: seriousbroker.it  This test is not yet approved or cleared by the [place]  [place] FDA and has been authorized for detection and/or diagnosis of SARS-CoV-2 by FDA under an Emergency Use Authorization (EUA). This EUA will remain in effect (meaning this test can be used) for the duration of the COVID-19 declaration under Section 564(b)(1) of the Act, 21 U.S.C. section 360bbb-3(b)(1), unless the authorization is terminated or revoked.  Performed at [facility], [address] [place],  [place] [address]          Radiology Studies: US  Venous Img Lower Bilateral (DVT) Result Date: 05/19/2024 CLINICAL DATA:  Lower extremity pain. EXAM: BILATERAL LOWER EXTREMITY VENOUS DOPPLER ULTRASOUND TECHNIQUE: Gray-scale sonography with graded compression, as well as color Doppler and duplex ultrasound were performed to evaluate the lower extremity deep venous systems from the level of the common femoral vein and including the common femoral, femoral, profunda femoral, popliteal and calf veins including the posterior tibial, peroneal and gastrocnemius veins when visible. The superficial great saphenous vein was also interrogated. Spectral Doppler was utilized to evaluate flow at rest and with distal augmentation maneuvers in the common femoral, femoral and popliteal veins. COMPARISON:  None Available. FINDINGS: RIGHT LOWER EXTREMITY Common Femoral Vein: No evidence of thrombus. Normal compressibility, respiratory phasicity and response to augmentation. Saphenofemoral Junction: No evidence of thrombus. Normal compressibility and flow on color Doppler imaging. Profunda Femoral Vein: No evidence of thrombus. Normal compressibility and flow on color Doppler imaging. Femoral Vein: No evidence of thrombus. Normal compressibility, respiratory phasicity and response to augmentation. Popliteal Vein: No evidence of thrombus. Normal compressibility, respiratory phasicity and response to augmentation. Calf Veins: No evidence of thrombus. Normal compressibility and flow on color Doppler  imaging. Superficial Great Saphenous Vein: No evidence of thrombus. Normal compressibility. Venous Reflux:  None. Other Findings: No evidence of superficial thrombophlebitis or abnormal fluid collection. LEFT LOWER EXTREMITY Common Femoral Vein: No evidence of thrombus. Normal compressibility, respiratory phasicity and response to augmentation. Saphenofemoral Junction: No evidence of thrombus. Normal compressibility and flow on color Doppler imaging. Profunda Femoral Vein: No evidence of thrombus. Normal compressibility and flow on color Doppler imaging. Femoral Vein: No evidence of thrombus. Normal compressibility, respiratory phasicity and response to augmentation. Popliteal Vein: No evidence of thrombus. Normal compressibility, respiratory phasicity and response to augmentation. Calf Veins: No evidence of thrombus. Normal compressibility and flow on color Doppler imaging. Superficial Great Saphenous Vein: No evidence of thrombus. Normal compressibility. Venous Reflux:  None. Other Findings: No evidence of superficial thrombophlebitis or abnormal fluid collection. IMPRESSION: No evidence of deep venous thrombosis in either lower extremity. Electronically Signed   By: Marcey Moan M.D.   On: 05/19/2024 17:20   CT Angio Chest Pulmonary Embolism (PE) W or WO Contrast Result Date: 05/18/2024 CLINICAL DATA:  Concern for pulmonary embolism.  Lymphoma. EXAM: CT ANGIOGRAPHY CHEST WITH CONTRAST TECHNIQUE: Multidetector CT imaging of the chest was performed using the standard protocol during bolus administration of intravenous contrast. Multiplanar CT image reconstructions and MIPs were obtained to evaluate the vascular anatomy. RADIATION DOSE REDUCTION: This exam was performed according to the departmental dose-optimization program which includes automated exposure control, adjustment of the mA and/or kV according to patient size and/or use of iterative reconstruction technique. CONTRAST:  50mL OMNIPAQUE IOHEXOL 350  MG/ML SOLN COMPARISON:  Chest radiograph dated 05/17/2024. FINDINGS: Cardiovascular: There is no cardiomegaly or pericardial effusion. The thoracic aorta is unremarkable. The origins of the great vessels of the aortic arch are patent. Right-sided Port-A-Cath with tip in the right atrium at the cavoatrial junction. Linear nonocclusive right lower lobe subsegmental pulmonary artery embolus, age indeterminate. No CT evidence of right heart straining. Mediastinum/Nodes: No hilar or mediastinal adenopathy. The esophagus and the thyroid gland are grossly unremarkable no mediastinal fluid collection. Lungs/Pleura: Minimal bibasilar linear atelectasis. No focal consolidation, pleural effusion, or pneumothorax. The central airways are patent. Upper Abdomen: No acute abnormality. Musculoskeletal: Degenerative changes of spine. No acute osseous pathology. Bilateral shoulder arthroplasties. Review of  the MIP images confirms the above findings. IMPRESSION: 1. Age indeterminate linear nonocclusive right lower lobe subsegmental pulmonary artery embolus. No CT evidence of right heart straining. 2. No focal consolidation. These results will be called to the ordering clinician or representative by the Radiologist Assistant, and communication documented in the PACS or [employer]. Electronically Signed   By: Vanetta Chou M.D.   On: 05/18/2024 14:56        Scheduled Meds:  acyclovir  400 mg Oral Daily   allopurinol  300 mg Oral Daily   chlorhexidine   15 mL Mouth/Throat BID   Chlorhexidine  Gluconate Cloth  6 each Topical Daily   feeding supplement  237 mL Oral BID BM   fentaNYL   1 patch Transdermal Q72H   gabapentin   300 mg Oral TID   loratadine  10 mg Oral Daily   oxybutynin  10 mg Oral Daily   pantoprazole  40 mg Oral Daily   phenazopyridine  200 mg Oral TID WC   sodium chloride  flush  10-40 mL Intracatheter Q12H   sodium chloride  flush  3 mL Intravenous Q12H   tamsulosin  0.4 mg Oral Daily   Continuous  Infusions:  ampicillin-sulbactam (UNASYN) IV 3 g (05/20/24 0824)     LOS: 3 days       Anthony CHRISTELLA Pouch, MD [facility] Pager [phone]  If 7PM-7AM, please contact night-coverage [email or web address] 05/20/2024, 8:50 AM

## 2024-05-21 DIAGNOSIS — D61818 Other pancytopenia: Secondary | ICD-10-CM | POA: Diagnosis not present

## 2024-05-21 LAB — BASIC METABOLIC PANEL WITH GFR
Anion gap: 8 (ref 5–15)
BUN: 9 mg/dL (ref 8–23)
CO2: 28 mmol/L (ref 22–32)
Calcium: 8.1 mg/dL — ABNORMAL LOW (ref 8.9–10.3)
Chloride: 101 mmol/L (ref 98–111)
Creatinine, Ser: 0.47 mg/dL (ref 0.44–1.00)
GFR, Estimated: >60 mL/min (ref >60)
Glucose, Bld: 103 mg/dL — ABNORMAL HIGH (ref 70–99)
Potassium: 3.5 mmol/L (ref 3.5–5.1)
Sodium: 137 mmol/L (ref 135–145)

## 2024-05-21 LAB — CBC
HCT: 27.9 % — ABNORMAL LOW (ref 36.0–46.0)
Hemoglobin: 9.4 g/dL — ABNORMAL LOW (ref 12.0–15.0)
MCH: 30.7 pg (ref 26.0–34.0)
MCHC: 33.7 g/dL (ref 30.0–36.0)
MCV: 91.2 fL (ref 80.0–100.0)
Platelets: 42 K/uL — ABNORMAL LOW (ref 150–400)
RBC: 3.06 MIL/uL — ABNORMAL LOW (ref 3.87–5.11)
RDW: 12.3 % (ref 11.5–15.5)
WBC: 1.2 K/uL — CL (ref 4.0–10.5)
nRBC: 0 % (ref 0.0–0.2)

## 2024-05-21 NOTE — Plan of Care (Signed)

## 2024-05-21 NOTE — Progress Notes (Signed)
 PROGRESS NOTE   HPI was taken from Dr. Florie:  Stacey [name] is a 63 y.o. female with a known history of diffuse large B-cell lymphoma on chemotherapy, last treatment last week followed by Dr. Babara, left ureteral stent arthritis, GERD presents to the emergency department for evaluation of fever and dysuria.  Patient was in a usual state of health until last night when she reports the onset of dysuria as well as suprapubic and bilateral low back pain.  She developed fever this morning..   Of note in September 2025 CT angiogram showed an  obstructing mass along the left pelvic sidewall causing hydroureter and hydronephrosis.  At that time stent placed by urologist   Patient denies weakness, dizziness, chest pain, shortness of breath, N/V/C/D, frequency, changes in mental status.    Otherwise there has been no change in status. Patient has been taking medication as prescribed and there has been no recent change in medication or diet.  No recent antibiotics.  There has been no recent illness, hospitalizations, travel or sick contacts.     EMS/ED Course: Patient received Rocephin, morphine , Zofran . Medical admission has been requested for further management of febrile neutropenia, sepsis secondary to urinary tract infection which is complicated by a left-sided ureteral stent as well as incidental finding of right lower lobe PE.  Dr. Rennie of oncology was notified the patient will be admitted   Stacey [name]  FMW:[id number] DOB: [birth year] DOA: 05/17/2024 PCP: [name], PA-C   Assessment & Plan:   Principal Problem:   Febrile neutropenia Active Problems:   Sepsis (HCC)   Pulmonary embolism (HCC)   Urinary tract infection without hematuria   Neutropenia  Assessment and Plan: Sepsis: secondary to acute complicated UTI, neutropenic fever. See Dr. Rosilyn note on how pt met sepsis criteria. Continue on IV unasyn. No fevers so far today. Continue on acyclovir for anti-viral  prophylaxis.  UTI: urine cx growing enterococcus faecalis. Continue on IV unasyn  Left pelvic mass: w/ left hydronephrosis & s/p left ureteral stent in September 2025. Continue on flomax, pyridium, oxybutynin. Uro saw pt and pt will f/u outpatient w/ uro     Acute right lower lobe pulmonary embolism: case discussed with Dr. Babara, oncologist.  Dr. Babara recommended discontinuing IV heparin drip because of worsening thrombocytopenia. US  of b/l LE neg for DVT but would consider IVC filter in future secondary to high risk of DVTs and/or recurrent PE. Unsure if pt would be a candidate currently for IVC filter b/c of thrombocytopenia and no LE DVT   Pancytopenia: likely secondary to diffuse large B cell lymphoma & recent chemo. S/p long-acting G-CSF. No need for a transfusion currently    Diffuse large B-cell lymphoma: recent R-CHOP about a week prior to admission. Onco following and recs apprec  Hx of gout: continue on home dose of allopurinol    GERD: continue on PPI     DVT prophylaxis: SCDs Code Status: full  Family Communication:  Disposition Plan: likely d/c home w/ HH   Level of care: Telemetry  Status is: Inpatient Remains inpatient appropriate because: severity of illness, pancytopenia still & requiring IV abxs    Consultants:  Onco   Procedures:   Antimicrobials: unasyn    Subjective: Pt c/o malaise  Objective: Vitals:   05/20/24 1600 05/20/24 2108 05/21/24 0358 05/21/24 0833  BP: (!) 154/59 (!) 145/63 (!) 151/62 (!) 140/66  Pulse: 82 81 81 79  Resp:  16 17 16   Temp: 99.4 F (37.4 C)  98.2 F (36.8 C) 98 F (36.7 C) 99.2 F (37.3 C)  TempSrc:      SpO2:  99% 97% 95%  Weight:      Height:        Intake/Output Summary (Last 24 hours) at 05/21/2024 0853 Last data filed at 05/21/2024 0358 Gross per 24 hour  Intake 240 ml  Output 900 ml  Net -660 ml   Filed Weights   05/17/24 1509  Weight: 72.6 kg    Examination:  General exam: Appears calm but  uncomfortable  Respiratory system: diminished breath sounds b/l  Cardiovascular system: S1/S2+. No rubs or clicks  Gastrointestinal system: abd is soft, NT, ND & normal bowel sounds Central nervous system: alert & oriented. Moves all extremities  Psychiatry: Judgement and insight appears at baseline. Flat mood and affect    Data Reviewed: I have personally reviewed following labs and imaging studies  CBC: Recent Labs  Lab 05/17/24 1613 05/18/24 0425 05/19/24 0130 05/20/24 0531 05/21/24 0514  WBC 0.1* 0.3* 0.2* 0.4* 1.2*  NEUTROABS 0.0*  --   --   --   --   HGB 12.5 10.6* 9.1* 9.2* 9.4*  HCT 35.6* 31.1* 26.7* 27.7* 27.9*  MCV 88.1 90.7 89.9 91.4 91.2  PLT 58* 43* 26* 28* 42*   Basic Metabolic Panel: Recent Labs  Lab 05/17/24 1613 05/17/24 2136 05/18/24 0425 05/20/24 0531 05/21/24 0514  NA 130*  --  134* 137 137  K 4.0  --  3.6 3.4* 3.5  CL 95*  --  101 101 101  CO2 24  --  27 28 28   GLUCOSE 154*  --  129* 107* 103*  BUN 12  --  10 12 9   CREATININE 0.43*  --  0.44 0.51 0.47  CALCIUM 9.0  --  8.4* 8.2* 8.1*  MG  --  2.0  --   --   --   PHOS  --  3.3  --  2.8  --    GFR: Estimated Creatinine Clearance: 72.2 mL/min (by C-G formula based on SCr of 0.47 mg/dL). Liver Function Tests: Recent Labs  Lab 05/17/24 1613 05/18/24 0425 05/20/24 0531  AST 16 12*  --   ALT 10 7  --   ALKPHOS 64 49  --   BILITOT 1.3* 0.9  --   PROT 7.5 5.9*  --   ALBUMIN 3.9 3.0* 2.7*   No results for input(s): LIPASE, AMYLASE in the last 168 hours. No results for input(s): AMMONIA in the last 168 hours. Coagulation Profile: Recent Labs  Lab 05/17/24 1613  INR 0.9   Cardiac Enzymes: No results for input(s): CKTOTAL, CKMB, CKMBINDEX, TROPONINI in the last 168 hours. BNP (last 3 results) No results for input(s): PROBNP in the last 8760 hours. HbA1C: No results for input(s): HGBA1C in the last 72 hours. CBG: No results for input(s): GLUCAP in the last 168  hours. Lipid Profile: No results for input(s): CHOL, HDL, LDLCALC, TRIG, CHOLHDL, LDLDIRECT in the last 72 hours. Thyroid Function Tests: No results for input(s): TSH, T4TOTAL, FREET4, T3FREE, THYROIDAB in the last 72 hours. Anemia Panel: No results for input(s): VITAMINB12, FOLATE, FERRITIN, TIBC, IRON, RETICCTPCT in the last 72 hours. Sepsis Labs: Recent Labs  Lab 05/17/24 1613 05/17/24 1917  LATICACIDVEN 1.7 0.9    Recent Results (from the past 240 hours)  Urine Culture     Status: Abnormal   Collection Time: 05/17/24  3:58 PM   Specimen: Urine, Clean Catch  Result Value Ref  Range Status   Specimen Description   Final    URINE, CLEAN CATCH Performed at [facility], [address] [place], [place] [address]    Special Requests   Final    NONE Reflexed from [id number] Performed at [facility], [address] [place], [place] [address]    Culture >=100,000 COLONIES/mL ENTEROCOCCUS FAECALIS (A)  Final   Report Status 05/20/2024 FINAL  Final   Organism ID, Bacteria ENTEROCOCCUS FAECALIS (A)  Final      Susceptibility   Enterococcus faecalis - MIC*    AMPICILLIN <=2 SENSITIVE Sensitive     NITROFURANTOIN <=16 SENSITIVE Sensitive     VANCOMYCIN  1 SENSITIVE Sensitive     * >=100,000 COLONIES/mL ENTEROCOCCUS FAECALIS  Blood Culture (routine x 2)     Status: None (Preliminary result)   Collection Time: 05/17/24  4:13 PM   Specimen: BLOOD  Result Value Ref Range Status   Specimen Description BLOOD BLOOD RIGHT FOREARM  Final   Special Requests   Final    BOTTLES DRAWN AEROBIC AND ANAEROBIC Blood Culture results may not be optimal due to an inadequate volume of blood received in culture bottles   Culture   Final    NO GROWTH 4 DAYS Performed at [facility], [address], [place], [place] [address]    Report Status PENDING  Incomplete  Blood Culture (routine x 2)     Status: None (Preliminary result)   Collection  Time: 05/17/24  4:13 PM   Specimen: BLOOD  Result Value Ref Range Status   Specimen Description BLOOD LEFT ANTECUBITAL  Final   Special Requests   Final    BOTTLES DRAWN AEROBIC AND ANAEROBIC Blood Culture results may not be optimal due to an inadequate volume of blood received in culture bottles   Culture   Final    NO GROWTH 4 DAYS Performed at [facility], [address], [place], [place] [address]    Report Status PENDING  Incomplete  Resp panel by RT-PCR (RSV, Flu A&B, Covid) Anterior Nasal Swab     Status: None   Collection Time: 05/17/24  8:09 PM   Specimen: Anterior Nasal Swab  Result Value Ref Range Status   SARS Coronavirus 2 by RT PCR NEGATIVE NEGATIVE Final    Comment: (NOTE) SARS-CoV-2 target nucleic acids are NOT DETECTED.  The SARS-CoV-2 RNA is generally detectable in upper respiratory specimens during the acute phase of infection. The lowest concentration of SARS-CoV-2 viral copies this assay can detect is 138 copies/mL. A negative result does not preclude SARS-Cov-2 infection and should not be used as the sole basis for treatment or other patient management decisions. A negative result may occur with  improper specimen collection/handling, submission of specimen other than nasopharyngeal swab, presence of viral mutation(s) within the areas targeted by this assay, and inadequate number of viral copies(<138 copies/mL). A negative result must be combined with clinical observations, patient history, and epidemiological information. The expected result is Negative.  Fact Sheet for Patients:  bloggercourse.com  Fact Sheet for Healthcare Providers:  seriousbroker.it  This test is no t yet approved or cleared by the [place]  FDA and  has been authorized for detection and/or diagnosis of SARS-CoV-2 by FDA under an Emergency Use Authorization (EUA). This EUA will remain  in effect (meaning this test can  be used) for the duration of the COVID-19 declaration under Section 564(b)(1) of the Act, 21 U.S.C.section 360bbb-3(b)(1), unless the authorization is terminated  or  revoked sooner.       Influenza A by PCR NEGATIVE NEGATIVE Final   Influenza B by PCR NEGATIVE NEGATIVE Final    Comment: (NOTE) The Xpert Xpress SARS-CoV-2/FLU/RSV plus assay is intended as an aid in the diagnosis of influenza from Nasopharyngeal swab specimens and should not be used as a sole basis for treatment. Nasal washings and aspirates are unacceptable for Xpert Xpress SARS-CoV-2/FLU/RSV testing.  Fact Sheet for Patients: bloggercourse.com  Fact Sheet for Healthcare Providers: seriousbroker.it  This test is not yet approved or cleared by the [place]  FDA and has been authorized for detection and/or diagnosis of SARS-CoV-2 by FDA under an Emergency Use Authorization (EUA). This EUA will remain in effect (meaning this test can be used) for the duration of the COVID-19 declaration under Section 564(b)(1) of the Act, 21 U.S.C. section 360bbb-3(b)(1), unless the authorization is terminated or revoked.     Resp Syncytial Virus by PCR NEGATIVE NEGATIVE Final    Comment: (NOTE) Fact Sheet for Patients: bloggercourse.com  Fact Sheet for Healthcare Providers: seriousbroker.it  This test is not yet approved or cleared by the [place]  FDA and has been authorized for detection and/or diagnosis of SARS-CoV-2 by FDA under an Emergency Use Authorization (EUA). This EUA will remain in effect (meaning this test can be used) for the duration of the COVID-19 declaration under Section 564(b)(1) of the Act, 21 U.S.C. section 360bbb-3(b)(1), unless the authorization is terminated or revoked.  Performed at [facility], [address], [place], [place] [address]          Radiology Studies: US   Venous Img Lower Bilateral (DVT) Result Date: 05/19/2024 CLINICAL DATA:  Lower extremity pain. EXAM: BILATERAL LOWER EXTREMITY VENOUS DOPPLER ULTRASOUND TECHNIQUE: Gray-scale sonography with graded compression, as well as color Doppler and duplex ultrasound were performed to evaluate the lower extremity deep venous systems from the level of the common femoral vein and including the common femoral, femoral, profunda femoral, popliteal and calf veins including the posterior tibial, peroneal and gastrocnemius veins when visible. The superficial great saphenous vein was also interrogated. Spectral Doppler was utilized to evaluate flow at rest and with distal augmentation maneuvers in the common femoral, femoral and popliteal veins. COMPARISON:  None Available. FINDINGS: RIGHT LOWER EXTREMITY Common Femoral Vein: No evidence of thrombus. Normal compressibility, respiratory phasicity and response to augmentation. Saphenofemoral Junction: No evidence of thrombus. Normal compressibility and flow on color Doppler imaging. Profunda Femoral Vein: No evidence of thrombus. Normal compressibility and flow on color Doppler imaging. Femoral Vein: No evidence of thrombus. Normal compressibility, respiratory phasicity and response to augmentation. Popliteal Vein: No evidence of thrombus. Normal compressibility, respiratory phasicity and response to augmentation. Calf Veins: No evidence of thrombus. Normal compressibility and flow on color Doppler imaging. Superficial Great Saphenous Vein: No evidence of thrombus. Normal compressibility. Venous Reflux:  None. Other Findings: No evidence of superficial thrombophlebitis or abnormal fluid collection. LEFT LOWER EXTREMITY Common Femoral Vein: No evidence of thrombus. Normal compressibility, respiratory phasicity and response to augmentation. Saphenofemoral Junction: No evidence of thrombus. Normal compressibility and flow on color Doppler imaging. Profunda Femoral Vein: No evidence of  thrombus. Normal compressibility and flow on color Doppler imaging. Femoral Vein: No evidence of thrombus. Normal compressibility, respiratory phasicity and response to augmentation. Popliteal Vein: No evidence of thrombus. Normal compressibility, respiratory phasicity and response to augmentation. Calf Veins: No evidence of thrombus. Normal compressibility and flow on color Doppler imaging. Superficial Great Saphenous Vein: No evidence of thrombus. Normal compressibility. Venous  Reflux:  None. Other Findings: No evidence of superficial thrombophlebitis or abnormal fluid collection. IMPRESSION: No evidence of deep venous thrombosis in either lower extremity. Electronically Signed   By: Marcey Moan M.D.   On: 05/19/2024 17:20        Scheduled Meds:  acyclovir  400 mg Oral Daily   allopurinol  300 mg Oral Daily   chlorhexidine   15 mL Mouth/Throat BID   Chlorhexidine  Gluconate Cloth  6 each Topical Daily   feeding supplement  237 mL Oral BID BM   fentaNYL   1 patch Transdermal Q72H   gabapentin   300 mg Oral TID   loratadine  10 mg Oral Daily   oxybutynin  10 mg Oral Daily   pantoprazole  40 mg Oral Daily   phenazopyridine  200 mg Oral TID WC   sodium chloride  flush  10-40 mL Intracatheter Q12H   sodium chloride  flush  3 mL Intravenous Q12H   tamsulosin  0.4 mg Oral Daily   Continuous Infusions:  ampicillin-sulbactam (UNASYN) IV 3 g (05/21/24 0501)     LOS: 4 days       Anthony CHRISTELLA Pouch, MD [facility] Pager [phone]  If 7PM-7AM, please contact night-coverage [email or web address] 05/21/2024, 8:53 AM

## 2024-05-21 NOTE — Progress Notes (Addendum)
 Physical Therapy Treatment Patient Details Name: Stacey [name] MRN: [id number] DOB: 1960/11/02 Today's Date: 05/21/2024   History of Present Illness 63 y.o. female with a known history of diffuse large B-cell lymphoma on chemotherapy, last treatment last week followed by Dr. Babara, left ureteral stent, GERD presents to the emergency department for evaluation of fever and dysuria.  Patient was in a usual state of health until night prior with dysuria as well as suprapubic, L pelvis and bilateral low back pain.  Of note in September 2025 CT angiogram showed an  obstructing mass along the left pelvic sidewall causing hydroureter and hydronephrosis.    PT Comments  Pt in bed, needing to void.  She is able to get to EOB with bed features and generally steady in sitting.  Stands to RW with cga x 1 and encouraged to stand before gait.  She is able to walk to bathroom for BM.  Flushed oh her own.  RN to check as HR 140 with gait and telemetry called.  On toilet IV noted to be leaking.  She stands and walks back to bed with intention of sitting in recliner but she has increased weakness.  She sits at end of bed to rest with HR 150 and continued feeling of weakness  She takes a few moments before repositioning herself higher up in bed.  Did not feels she could sit at this time.  Pt has been using purwick in bed.  While pt is moving better, HR increased with mobility and general weakness.  Given UTI and incontinence, purwick is returned when pt is in bed. Pt instructed to use caution if walking to/from bathroom with nursing staff given response to gait today and is encouraged to use BSC if needed for safety.  Voices understanding.  Discussed at length home DME with husband.  Updated list to include RW, BSC, wheelchair and cushion.  Pt does have 4 steps to get in/out of home with handrails.  He is encouraged to explore portable ramps as she may have difficulty with steps upon discharge and he does not have help  at home to enter/exit home if that is disposition.  Encouraged him to look online/[employer] so he is prepared for discharge home when medically ready.    If plan is discharge home, recommend the following: A little help with walking and/or transfers;A little help with bathing/dressing/bathroom;Assistance with cooking/housework;Assist for transportation;Help with stairs or ramp for entrance   Can travel by private vehicle        Equipment Recommendations  Rolling walker (2 wheels);BSC/3in1;Wheelchair (measurements PT)    Recommendations for Other Services       Precautions / Restrictions Precautions Precautions: Fall Precaution/Restrictions Comments: HR elevated Restrictions Weight Bearing Restrictions Per Provider Order: No     Mobility  Bed Mobility Overal bed mobility: Needs Assistance Bed Mobility: Supine to Sit, Sit to Supine     Supine to sit: Used rails, HOB elevated, Supervision Sit to supine: HOB elevated, Used rails, Min assist     Patient Response: Cooperative  Transfers Overall transfer level: Needs assistance Equipment used: Rolling walker (2 wheels) Transfers: Sit to/from Stand Sit to Stand: Contact guard assist, Supervision                Ambulation/Gait Ambulation/Gait assistance: Contact guard assist, Min assist Gait Distance (Feet): 20 Feet Assistive device: Rolling walker (2 wheels) Gait Pattern/deviations: Step-to pattern Gait velocity: dec     General Gait Details: to/from bathroom with increased WB LLE with  distance.  HR increased 140's-150 with gait. Fatiuge and weakness upon return to bed.   Stairs             Wheelchair Mobility     Tilt Bed Tilt Bed Patient Response: Cooperative  Modified Rankin (Stroke Patients Only)       Balance Overall balance assessment: Needs assistance Sitting-balance support: Feet supported Sitting balance-Leahy Scale: Good     Standing balance support: Bilateral upper extremity  supported Standing balance-Leahy Scale: Fair Standing balance comment: no outside assist needed but did fatigue on return to bed needing increases assist/guarding for safety                            Communication Communication Communication: No apparent difficulties  Cognition Arousal: Alert Behavior During Therapy: WFL for tasks assessed/performed   PT - Cognitive impairments: No apparent impairments                         Following commands: Intact      Cueing Cueing Techniques: Verbal cues  Exercises      General Comments        Pertinent Vitals/Pain Pain Assessment Pain Assessment: Faces Faces Pain Scale: Hurts little more Pain Location: lower abdomen, L>R Pain Descriptors / Indicators: Aching, Discomfort Pain Intervention(s): Monitored during session, Limited activity within patient's tolerance    Home Living                          Prior Function            PT Goals (current goals can now be found in the care plan section) Progress towards PT goals: Progressing toward goals    Frequency    Min 1X/week      PT Plan      Co-evaluation              AM-PAC PT 6 Clicks Mobility   Outcome Measure  Help needed turning from your back to your side while in a flat bed without using bedrails?: None Help needed moving from lying on your back to sitting on the side of a flat bed without using bedrails?: None Help needed moving to and from a bed to a chair (including a wheelchair)?: A Little Help needed standing up from a chair using your arms (e.g., wheelchair or bedside chair)?: A Little Help needed to walk in hospital room?: A Little Help needed climbing 3-5 steps with a railing? : A Lot 6 Click Score: 19    End of Session Equipment Utilized During Treatment: Gait belt Activity Tolerance: Patient limited by fatigue;Treatment limited secondary to medical complications (Comment) Patient left: in bed;with call  bell/phone within reach;with nursing/sitter in room Nurse Communication: Mobility status PT Visit Diagnosis: Muscle weakness (generalized) (M62.81);Difficulty in walking, not elsewhere classified (R26.2);Pain Pain - part of body: Hip     Time: 8855-8784 PT Time Calculation (min) (ACUTE ONLY): 31 min  Charges:    $Gait Training: 23-37 mins PT General Charges $$ ACUTE PT VISIT: 1 Visit                   Lauraine Gills, PTA 05/21/24, 1:23 PM

## 2024-05-22 ENCOUNTER — Telehealth (HOSPITAL_COMMUNITY): Payer: Self-pay

## 2024-05-22 ENCOUNTER — Other Ambulatory Visit (HOSPITAL_COMMUNITY): Payer: Self-pay

## 2024-05-22 DIAGNOSIS — I2699 Other pulmonary embolism without acute cor pulmonale: Secondary | ICD-10-CM | POA: Diagnosis not present

## 2024-05-22 DIAGNOSIS — G893 Neoplasm related pain (acute) (chronic): Secondary | ICD-10-CM | POA: Diagnosis not present

## 2024-05-22 DIAGNOSIS — D709 Neutropenia, unspecified: Secondary | ICD-10-CM | POA: Diagnosis not present

## 2024-05-22 DIAGNOSIS — D696 Thrombocytopenia, unspecified: Secondary | ICD-10-CM

## 2024-05-22 DIAGNOSIS — N390 Urinary tract infection, site not specified: Secondary | ICD-10-CM | POA: Diagnosis not present

## 2024-05-22 DIAGNOSIS — C8330 Diffuse large B-cell lymphoma, unspecified site: Secondary | ICD-10-CM | POA: Diagnosis not present

## 2024-05-22 DIAGNOSIS — R5081 Fever presenting with conditions classified elsewhere: Secondary | ICD-10-CM | POA: Diagnosis not present

## 2024-05-22 LAB — BASIC METABOLIC PANEL WITH GFR
Anion gap: 10 (ref 5–15)
BUN: 11 mg/dL (ref 8–23)
CO2: 28 mmol/L (ref 22–32)
Calcium: 8.2 mg/dL — ABNORMAL LOW (ref 8.9–10.3)
Chloride: 100 mmol/L (ref 98–111)
Creatinine, Ser: 0.3 mg/dL — ABNORMAL LOW (ref 0.44–1.00)
GFR, Estimated: >60 mL/min (ref >60)
Glucose, Bld: 105 mg/dL — ABNORMAL HIGH (ref 70–99)
Potassium: 3.7 mmol/L (ref 3.5–5.1)
Sodium: 138 mmol/L (ref 135–145)

## 2024-05-22 LAB — CBC
HCT: 26.4 % — ABNORMAL LOW (ref 36.0–46.0)
Hemoglobin: 8.8 g/dL — ABNORMAL LOW (ref 12.0–15.0)
MCH: 30.4 pg (ref 26.0–34.0)
MCHC: 33.3 g/dL (ref 30.0–36.0)
MCV: 91.3 fL (ref 80.0–100.0)
Platelets: 67 K/uL — ABNORMAL LOW (ref 150–400)
RBC: 2.89 MIL/uL — ABNORMAL LOW (ref 3.87–5.11)
RDW: 12.4 % (ref 11.5–15.5)
WBC: 2.7 K/uL — ABNORMAL LOW (ref 4.0–10.5)
nRBC: 0 % (ref 0.0–0.2)

## 2024-05-22 LAB — CULTURE, BLOOD (ROUTINE X 2)
Culture: NO GROWTH
Culture: NO GROWTH

## 2024-05-22 MED ORDER — APIXABAN 5 MG PO TABS: 5.0000 mg | ORAL_TABLET | Freq: Two times a day (BID) | ORAL | Status: DC

## 2024-05-22 MED ORDER — GABAPENTIN 400 MG PO CAPS
400.0000 mg | ORAL_CAPSULE | Freq: Three times a day (TID) | ORAL | Status: DC
  Administered 2024-05-22 – 2024-05-24 (×6): 400 mg via ORAL
  Filled 2024-05-22 (×6): qty 1

## 2024-05-22 MED ORDER — APIXABAN 5 MG PO TABS
10.0000 mg | ORAL_TABLET | Freq: Two times a day (BID) | ORAL | Status: DC
  Administered 2024-05-22 – 2024-05-24 (×5): 10 mg via ORAL
  Filled 2024-05-22 (×5): qty 2

## 2024-05-22 NOTE — Plan of Care (Signed)

## 2024-05-22 NOTE — Progress Notes (Signed)
 Occupational Therapy Treatment Patient Details Name: Stacey [name] MRN: [id number] DOB: [birth year] Today's Date: 05/22/2024   History of present illness 63 y.o. female with a known history of diffuse large B-cell lymphoma on chemotherapy, last treatment last week followed by Dr. Babara, left ureteral stent, GERD presents to the emergency department for evaluation of fever and dysuria.  Patient was in a usual state of health until night prior with dysuria as well as suprapubic, L pelvis and bilateral low back pain.  Of note in September 2025 CT angiogram showed an  obstructing mass along the left pelvic sidewall causing hydroureter and hydronephrosis.   OT comments  Pt seen for OT treatment on this date. Upon arrival to room pt semi supine in bed, agreeable to tx. Pt completed bed mobility with MODI, increased time required as task is painful for pt to complete due to her LLE pain. Pt STS from lowest bed height with CGA for safety, verbal cues for technique. Pt ambulated into BR for voiding and sink level grooming with CGA throughout with use of RW. HR monitored continuously, reaching 147bpm during peak ambulation and transfer, in sitting HR 110bpm. Pt making good progress toward goals, will continue to follow POC. Discharge recommendation remains appropriate.        If plan is discharge home, recommend the following:  A little help with walking and/or transfers;A little help with bathing/dressing/bathroom;Assistance with cooking/housework;Assist for transportation;Help with stairs or ramp for entrance   Equipment Recommendations  BSC/3in1       Precautions / Restrictions Precautions Precautions: Fall Recall of Precautions/Restrictions: Intact Precaution/Restrictions Comments: HR elevated Restrictions Weight Bearing Restrictions Per Provider Order: No       Mobility Bed Mobility Overal bed mobility: Modified Independent             General bed mobility comments: Increased time to  complete with no physical assistance, very effortful to come to EOB due to LLE pain    Transfers Overall transfer level: Needs assistance Equipment used: Rolling walker (2 wheels) Transfers: Sit to/from Stand Sit to Stand: Contact guard assist           General transfer comment: STS from low bed height with CGA for safety, amb into BR for toileting needs and sink level grooming.     Balance Overall balance assessment: Needs assistance Sitting-balance support: Feet supported Sitting balance-Leahy Scale: Good Sitting balance - Comments: Steady reaching within BOS   Standing balance support: Bilateral upper extremity supported Standing balance-Leahy Scale: Fair Standing balance comment: no outside assist needed but did fatigue on return to bed needing increases assist/guarding for safety                           ADL either performed or assessed with clinical judgement   ADL Overall ADL's : Needs assistance/impaired Eating/Feeding: Set up;Sitting   Grooming: Wash/dry face;Wash/dry hands;Sitting;Contact guard assist Grooming Details (indicate cue type and reason): Sink level grooming                 Toilet Transfer: Ambulation;Regular Toilet;Rolling walker (2 wheels);Grab bars;Contact guard assist Toilet Transfer Details (indicate cue type and reason): Heavy UE use on grab rails for STS from low toilet height Toileting- Clothing Manipulation and Hygiene: Supervision/safety;Sitting/lateral lean       Functional mobility during ADLs: Rolling walker (2 wheels);Contact guard assist General ADL Comments: Sink level grooming with CGA for static standing with use of RW, verbal cues for DME placement  during ADL completion     Communication Communication Communication: No apparent difficulties   Cognition Arousal: Alert Behavior During Therapy: WFL for tasks assessed/performed Cognition: No apparent impairments                                Following commands: Intact        Cueing   Cueing Techniques: Verbal cues  Exercises Exercises: Other exercises Other Exercises Other Exercises: Edu: Energy conservation techqniques for return home    Shoulder Instructions       General Comments      Pertinent Vitals/ Pain       Pain Assessment Pain Assessment: 0-10 Pain Score: 10-Worst pain ever Pain Location: LLE Pain Descriptors / Indicators: Aching, Discomfort Pain Intervention(s): Limited activity within patient's tolerance, Monitored during session, Premedicated before session                                                          Frequency  Min 2X/week        Progress Toward Goals  OT Goals(current goals can now be found in the care plan section)  Progress towards OT goals: Progressing toward goals  Acute Rehab OT Goals OT Goal Formulation: With patient/family Time For Goal Achievement: 06/01/24 Potential to Achieve Goals: Good ADL Goals Pt Will Perform Grooming: with supervision;standing Pt Will Perform Lower Body Dressing: with supervision;sit to/from stand Pt Will Transfer to Toilet: with supervision;ambulating Pt Will Perform Toileting - Clothing Manipulation and hygiene: with supervision;sit to/from stand   AM-PAC OT 6 Clicks Daily Activity     Outcome Measure   Help from another person eating meals?: None Help from another person taking care of personal grooming?: None Help from another person toileting, which includes using toliet, bedpan, or urinal?: A Little Help from another person bathing (including washing, rinsing, drying)?: A Little Help from another person to put on and taking off regular upper body clothing?: None Help from another person to put on and taking off regular lower body clothing?: A Little 6 Click Score: 21    End of Session Equipment Utilized During Treatment: Rolling walker (2 wheels);Gait belt  OT Visit Diagnosis: Unsteadiness on feet  (R26.81);Repeated falls (R29.6);Muscle weakness (generalized) (M62.81)   Activity Tolerance Patient tolerated treatment well   Patient Left in chair;with call bell/phone within reach;with chair alarm set   Nurse Communication Mobility status        Time: 8884-8866 OT Time Calculation (min): 18 min  Charges: OT General Charges $OT Visit: 1 Visit OT Treatments $Self Care/Home Management : 8-22 mins  Larraine Colas M.S. OTR/L  05/22/24, 12:22 PM

## 2024-05-22 NOTE — Telephone Encounter (Signed)
 Pharmacy Patient Advocate Encounter  Insurance verification completed.    The patient is insured through [facility]. Patient has [employer], may use a copay card, and/or apply for patient assistance if available.    Ran test claim for Eliquis Starter Pack and the current 30 day co-pay is $40.   This test claim was processed through [employer]- copay amounts may vary at other pharmacies due to [employer], or as the patient moves through the different stages of their insurance plan.

## 2024-05-22 NOTE — Progress Notes (Signed)
 Physical Therapy Treatment Patient Details Name: Stacey [name] MRN: [id number] DOB: Dec 21, 1960 Today's Date: 05/22/2024   History of Present Illness 63 y.o. female with a known history of diffuse large B-cell lymphoma on chemotherapy, last treatment last week followed by Dr. Babara, left ureteral stent, GERD presents to the emergency department for evaluation of fever and dysuria.  Patient was in a usual state of health until night prior with dysuria as well as suprapubic, L pelvis and bilateral low back pain.  Of note in September 2025 CT angiogram showed an  obstructing mass along the left pelvic sidewall causing hydroureter and hydronephrosis.    PT Comments  Pt ready for session.  HR 80's at rest.  She is able to get in/out of bed on her own today with self assist of LLE.  She transfers in/out of recliner with RW and cga x 1.  She is taken to stairs in gym and is able to go up 1 steps with L rail and SPC, then after seated rest she is able to do another 2 steps.  HR does increase to 140's on both attempts so session is limited by HR.  She does not feel weak today as she did yesterday.  Discussed discharge plan with pt.  Mobility wise she is improving with the ability to put more weight on LLE for transfers, gait and stairs.  Mobility is primarily limited at this time due to HR.  Will discuss with team.  Pt is encouraged to use wheelchair at home for longer distances for energy conservation and HR control.  Reviewed manual HR monitoring but she stated she did not want to do that and is encourage to get finger monitor for home awareness.     If plan is discharge home, recommend the following: A little help with walking and/or transfers;A little help with bathing/dressing/bathroom;Assistance with cooking/housework;Assist for transportation;Help with stairs or ramp for entrance   Can travel by private vehicle        Equipment Recommendations  Rolling walker (2 wheels);BSC/3in1;Wheelchair  (measurements PT)    Recommendations for Other Services       Precautions / Restrictions Precautions Precautions: Fall Precaution/Restrictions Comments: HR elevated Restrictions Weight Bearing Restrictions Per Provider Order: No     Mobility  Bed Mobility Overal bed mobility: Modified Independent               Patient Response: Cooperative  Transfers Overall transfer level: Needs assistance Equipment used: Rolling walker (2 wheels) Transfers: Sit to/from Stand Sit to Stand: Supervision                Ambulation/Gait Ambulation/Gait assistance: Contact guard assist Gait Distance (Feet): 4 Feet Assistive device: Rolling walker (2 wheels) Gait Pattern/deviations: Step-to pattern Gait velocity: dec         Stairs Stairs: Yes   Stair Management: One rail Left, With cane, Forwards Number of Stairs: 3 General stair comments: 1 step, 2 steps limited by HR   Wheelchair Mobility     Tilt Bed Tilt Bed Patient Response: Cooperative  Modified Rankin (Stroke Patients Only)       Balance Overall balance assessment: Needs assistance Sitting-balance support: Feet supported Sitting balance-Leahy Scale: Good     Standing balance support: Bilateral upper extremity supported Standing balance-Leahy Scale: Fair                              Communication Communication Communication: No apparent difficulties  Cognition Arousal: Alert Behavior During Therapy: WFL for tasks assessed/performed   PT - Cognitive impairments: No apparent impairments                         Following commands: Intact      Cueing Cueing Techniques: Verbal cues  Exercises      General Comments        Pertinent Vitals/Pain Pain Assessment Pain Assessment: Faces Faces Pain Scale: Hurts little more Pain Location: LLE Pain Descriptors / Indicators: Aching, Discomfort Pain Intervention(s): Limited activity within patient's tolerance, Monitored  during session, Premedicated before session, Repositioned    Home Living                          Prior Function            PT Goals (current goals can now be found in the care plan section) Progress towards PT goals: Progressing toward goals    Frequency    Min 1X/week      PT Plan      Co-evaluation              AM-PAC PT 6 Clicks Mobility   Outcome Measure  Help needed turning from your back to your side while in a flat bed without using bedrails?: None Help needed moving from lying on your back to sitting on the side of a flat bed without using bedrails?: None Help needed moving to and from a bed to a chair (including a wheelchair)?: A Little Help needed standing up from a chair using your arms (e.g., wheelchair or bedside chair)?: A Little Help needed to walk in hospital room?: A Little Help needed climbing 3-5 steps with a railing? : A Little 6 Click Score: 20    End of Session Equipment Utilized During Treatment: Gait belt Activity Tolerance: Patient limited by fatigue;Treatment limited secondary to medical complications (Comment) Patient left: in bed;with call bell/phone within reach;with nursing/sitter in room Nurse Communication: Mobility status PT Visit Diagnosis: Muscle weakness (generalized) (M62.81);Difficulty in walking, not elsewhere classified (R26.2);Pain Pain - part of body: Hip     Time: 9149-9086 PT Time Calculation (min) (ACUTE ONLY): 23 min  Charges:    $Gait Training: 23-37 mins PT General Charges $$ ACUTE PT VISIT: 1 Visit                   Lauraine Gills, PTA 05/22/24, 9:23 AM

## 2024-05-22 NOTE — Progress Notes (Signed)
 Hematology/Oncology Progress note [phone] [phone]     Patient Care Team: [name], DEVONNA as PCP - General (Physician Assistant) Babara Call, MD as Consulting Physician (Oncology)   Name of the patient: Stacey [name]  [id number]  1960/10/07  Date of visit: 05/22/24   INTERVAL HISTORY-  afebrile Left calf/ankle burning/tingling, exacerbated after walking. She tolerated the dose increase of gabapentin  300mg  TID. Mild improvement.      Allergies  Allergen Reactions   Propofol  Nausea Only    Patient states medication can be tolerated with an antiemetic before taking    Patient Active Problem List   Diagnosis Date Noted   Diffuse large B-cell lymphoma (HCC) 04/26/2024    Priority: High   Neoplasm related pain 05/10/2024    Priority: Medium    Encounter for antineoplastic chemotherapy 05/10/2024    Priority: Medium    Sepsis (HCC) 05/18/2024   Pulmonary embolism (HCC) 05/18/2024   Urinary tract infection without hematuria 05/18/2024   Neutropenia 05/18/2024   Febrile neutropenia 05/17/2024   Pre-diabetes    Hydronephrosis of left kidney 04/08/2024   AKI (acute kidney injury) 04/08/2024   Hx of total shoulder replacement, right 02/08/2020   Osteoarthritis of left shoulder 01/11/2018   S/P shoulder replacement, left 01/11/2018     Past Medical History:  Diagnosis Date   Arthritis    left shoulder, neck, lower back  (01/11/2018)   Chronic lower back pain    Family history of adverse reaction to anesthesia    Mother has nausea   GERD (gastroesophageal reflux disease)    PONV (postoperative nausea and vomiting)    Pre-diabetes    UTI (urinary tract infection) 03/20/2021     Past Surgical History:  Procedure Laterality Date   APPENDECTOMY     CYSTOSCOPY W/ URETERAL STENT PLACEMENT Left 04/08/2024   Procedure: CYSTOSCOPY, WITH RETROGRADE PYELOGRAM AND URETERAL STENT INSERTION;  Surgeon: Watt Rush, MD;  Location: [facility] ORS;   Service: Urology;  Laterality: Left;   IR IMAGING GUIDED PORT INSERTION  05/01/2024   JOINT REPLACEMENT     TOTAL SHOULDER ARTHROPLASTY Left 01/11/2018   TOTAL SHOULDER ARTHROPLASTY Left 01/11/2018   Procedure: LEFT TOTAL SHOULDER ARTHROPLASTY;  Surgeon: Sharl Selinda Dover, MD;  Location: [facility] OR;  Service: Orthopedics;  Laterality: Left;  2.5 hrs   TOTAL SHOULDER ARTHROPLASTY Right 02/08/2020   Procedure: TOTAL SHOULDER ARTHROPLASTY;  Surgeon: Sharl Selinda Dover, MD;  Location: [facility] OR;  Service: Orthopedics;  Laterality: Right;  2.5 hrs RNFA    Social History   Socioeconomic History   Marital status: Married    Spouse name: Robbye Dede   Number of children: Not on file   Years of education: Not on file   Highest education level: Not on file  Occupational History   Not on file  Tobacco Use   Smoking status: Never   Smokeless tobacco: Never  Vaping Use   Vaping status: Never Used  Substance and Sexual Activity   Alcohol use: Not Currently    Comment: 01/11/2018 might have 1 drink/month   Drug use: Never   Sexual activity: Yes  Other Topics Concern   Not on file  Social History Narrative   Not on file   Social Drivers of Health   Financial Resource Strain: Low Risk  (03/30/2024)   Received from [facility]   Overall Financial Resource Strain (CARDIA)    Difficulty of Paying Living Expenses: Not hard at all  Food Insecurity: No Food Insecurity (  05/17/2024)   Hunger Vital Sign    Worried About Running Out of Food in the Last Year: Never true    Ran Out of Food in the Last Year: Never true  Transportation Needs: No Transportation Needs (05/17/2024)   PRAPARE - Administrator, Civil Service (Medical): No    Lack of Transportation (Non-Medical): No  Physical Activity: Not on file  Stress: Not on file  Social Connections: Socially Integrated (05/17/2024)   Social Connection and Isolation Panel    Frequency of Communication with Friends and  Family: Three times a week    Frequency of Social Gatherings with Friends and Family: Three times a week    Attends Religious Services: 1 to 4 times per year    Active Member of Clubs or Organizations: Yes    Attends Banker Meetings: 1 to 4 times per year    Marital Status: Married  Catering Manager Violence: Not At Risk (05/17/2024)   Humiliation, Afraid, Rape, and Kick questionnaire    Fear of Current or Ex-Partner: No    Emotionally Abused: No    Physically Abused: No    Sexually Abused: No     Family History  Problem Relation Age of Onset   Hypertension Mother    Heart disease Father    Heart attack Father    CAD Father    Colon cancer Neg Hx    Esophageal cancer Neg Hx    Rectal cancer Neg Hx    Stomach cancer Neg Hx      Current Facility-Administered Medications:    acetaminophen  (TYLENOL ) tablet 650 mg, 650 mg, Oral, Q6H PRN, 650 mg at 05/19/24 1555 **OR** acetaminophen  (TYLENOL ) suppository 650 mg, 650 mg, Rectal, Q6H PRN, Hugelmeyer, Alexis, DO   acyclovir (ZOVIRAX) 200 MG capsule 400 mg, 400 mg, Oral, Daily, Hugelmeyer, Alexis, DO, 400 mg at 05/22/24 0918   allopurinol (ZYLOPRIM) tablet 300 mg, 300 mg, Oral, Daily, Hugelmeyer, Alexis, DO, 300 mg at 05/22/24 0917   Ampicillin-Sulbactam (UNASYN) 3 g in sodium chloride  0.9 % 100 mL IVPB, 3 g, Intravenous, Q6H, Epifanio Alm SQUIBB, MD, Last Rate: 200 mL/hr at 05/22/24 1516, 3 g at 05/22/24 1516   apixaban (ELIQUIS) tablet 10 mg, 10 mg, Oral, BID, 10 mg at 05/22/24 1114 **FOLLOWED BY** [START ON 05/29/2024] apixaban (ELIQUIS) tablet 5 mg, 5 mg, Oral, BID, Steinbock, Emily, RPH   bisacodyl (DULCOLAX) EC tablet 5 mg, 5 mg, Oral, Daily PRN, Hugelmeyer, Alexis, DO, 5 mg at 05/20/24 1512   chlorhexidine  (PERIDEX ) 0.12 % solution 15 mL, 15 mL, Mouth/Throat, BID, Babara Call, MD, 15 mL at 05/22/24 9082   Chlorhexidine  Gluconate Cloth 2 % PADS 6 each, 6 each, Topical, Daily, Hugelmeyer, Alexis, DO, 6 each at 05/22/24 0947    feeding supplement (ENSURE PLUS HIGH PROTEIN) liquid 237 mL, 237 mL, Oral, BID BM, Hugelmeyer, Alexis, DO, 237 mL at 05/22/24 1316   fentaNYL  (DURAGESIC ) 25 MCG/HR 1 patch, 1 patch, Transdermal, Q72H, Hugelmeyer, Alexis, DO, 1 patch at 05/21/24 0459   gabapentin  (NEURONTIN ) capsule 400 mg, 400 mg, Oral, TID, Babara Call, MD, 400 mg at 05/22/24 1516   hydrALAZINE (APRESOLINE) injection 5 mg, 5 mg, Intravenous, Q6H PRN, Hugelmeyer, Alexis, DO   loratadine (CLARITIN) tablet 10 mg, 10 mg, Oral, Daily, Hugelmeyer, Alexis, DO, 10 mg at 05/22/24 9081   methocarbamol  (ROBAXIN ) tablet 500 mg, 500 mg, Oral, Q8H PRN, Hugelmeyer, Alexis, DO, 500 mg at 05/20/24 1512   morphine  (PF) 2 MG/ML injection 2 mg,  2 mg, Intravenous, Q4H PRN, Patel, Sona, MD, 2 mg at 05/22/24 1718   ondansetron  (ZOFRAN ) tablet 4 mg, 4 mg, Oral, Q6H PRN **OR** ondansetron  (ZOFRAN ) injection 4 mg, 4 mg, Intravenous, Q6H PRN, Hugelmeyer, Alexis, DO, 4 mg at 05/17/24 2102   oxybutynin (DITROPAN-XL) 24 hr tablet 10 mg, 10 mg, Oral, Daily, Hugelmeyer, Alexis, DO, 10 mg at 05/22/24 9082   oxyCODONE  (Oxy IR/ROXICODONE ) immediate release tablet 5 mg, 5 mg, Oral, Q4H PRN, Hugelmeyer, Alexis, DO, 5 mg at 05/22/24 1515   pantoprazole (PROTONIX) EC tablet 40 mg, 40 mg, Oral, Daily, Hugelmeyer, Alexis, DO, 40 mg at 05/22/24 9081   senna-docusate (Senokot-S) tablet 1 tablet, 1 tablet, Oral, QHS PRN, Hugelmeyer, Alexis, DO, 1 tablet at 05/19/24 2217   sodium chloride  flush (NS) 0.9 % injection 10-40 mL, 10-40 mL, Intracatheter, Q12H, Hugelmeyer, Alexis, DO, 10 mL at 05/22/24 0917   sodium chloride  flush (NS) 0.9 % injection 10-40 mL, 10-40 mL, Intracatheter, PRN, Hugelmeyer, Alexis, DO, 10 mL at 05/18/24 0217   sodium chloride  flush (NS) 0.9 % injection 3 mL, 3 mL, Intravenous, Q12H, Hugelmeyer, Alexis, DO, 3 mL at 05/22/24 0917   tamsulosin (FLOMAX) capsule 0.4 mg, 0.4 mg, Oral, Daily, Hugelmeyer, Alexis, DO, 0.4 mg at 05/22/24 0918   traZODone (DESYREL)  tablet 25 mg, 25 mg, Oral, QHS PRN, Hugelmeyer, Alexis, DO, 25 mg at 05/21/24 2143   Physical exam:  Vitals:   05/22/24 0424 05/22/24 0754 05/22/24 1146 05/22/24 1620  BP: (!) 138/54 (!) 147/56 120/80 (!) 140/53  Pulse: 84 86 (!) 109 91  Resp: 18   12  Temp: 98.4 F (36.9 C) 98.6 F (37 C) 98.7 F (37.1 C) 98 F (36.7 C)  TempSrc:  Axillary Axillary Oral  SpO2: 93% 96% 96% 96%  Weight:      Height:       Physical Exam Constitutional:      General: She is not in acute distress. HENT:     Head: Normocephalic and atraumatic.  Eyes:     General: No scleral icterus. Cardiovascular:     Rate and Rhythm: Normal rate and regular rhythm.  Pulmonary:     Effort: Pulmonary effort is normal. No respiratory distress.     Breath sounds: No wheezing.  Abdominal:     General: There is no distension.     Palpations: Abdomen is soft.     Tenderness: There is no abdominal tenderness.  Musculoskeletal:        General: Normal range of motion.     Cervical back: Normal range of motion and neck supple.  Skin:    Findings: No erythema.  Neurological:     Mental Status: She is alert and oriented to person, place, and time. Mental status is at baseline.     Motor: No abnormal muscle tone.  Psychiatric:        Mood and Affect: Mood and affect normal.       Labs    Latest Ref Rng & Units 05/22/2024    4:47 AM 05/21/2024    5:14 AM 05/20/2024    5:31 AM  CBC  WBC 4.0 - 10.5 K/uL 2.7  1.2  0.4   Hemoglobin 12.0 - 15.0 g/dL 8.8  9.4  9.2   Hematocrit 36.0 - 46.0 % 26.4  27.9  27.7   Platelets 150 - 400 K/uL 67  42  28       Latest Ref Rng & Units 05/22/2024    4:47 AM 05/21/2024  5:14 AM 05/20/2024    5:31 AM  CMP  Glucose 70 - 99 mg/dL 894  896  892   BUN 8 - 23 mg/dL 11  9  12    Creatinine 0.44 - 1.00 mg/dL 9.69  9.52  9.48   Sodium 135 - 145 mmol/L 138  137  137   Potassium 3.5 - 5.1 mmol/L 3.7  3.5  3.4   Chloride 98 - 111 mmol/L 100  101  101   CO2 22 - 32 mmol/L 28  28   28    Calcium 8.9 - 10.3 mg/dL 8.2  8.1  8.2      RADIOGRAPHIC STUDIES: I have personally reviewed the radiological images as listed and agreed with the findings in the report. US  Venous Img Lower Bilateral (DVT) Result Date: 05/19/2024 CLINICAL DATA:  Lower extremity pain. EXAM: BILATERAL LOWER EXTREMITY VENOUS DOPPLER ULTRASOUND TECHNIQUE: Gray-scale sonography with graded compression, as well as color Doppler and duplex ultrasound were performed to evaluate the lower extremity deep venous systems from the level of the common femoral vein and including the common femoral, femoral, profunda femoral, popliteal and calf veins including the posterior tibial, peroneal and gastrocnemius veins when visible. The superficial great saphenous vein was also interrogated. Spectral Doppler was utilized to evaluate flow at rest and with distal augmentation maneuvers in the common femoral, femoral and popliteal veins. COMPARISON:  None Available. FINDINGS: RIGHT LOWER EXTREMITY Common Femoral Vein: No evidence of thrombus. Normal compressibility, respiratory phasicity and response to augmentation. Saphenofemoral Junction: No evidence of thrombus. Normal compressibility and flow on color Doppler imaging. Profunda Femoral Vein: No evidence of thrombus. Normal compressibility and flow on color Doppler imaging. Femoral Vein: No evidence of thrombus. Normal compressibility, respiratory phasicity and response to augmentation. Popliteal Vein: No evidence of thrombus. Normal compressibility, respiratory phasicity and response to augmentation. Calf Veins: No evidence of thrombus. Normal compressibility and flow on color Doppler imaging. Superficial Great Saphenous Vein: No evidence of thrombus. Normal compressibility. Venous Reflux:  None. Other Findings: No evidence of superficial thrombophlebitis or abnormal fluid collection. LEFT LOWER EXTREMITY Common Femoral Vein: No evidence of thrombus. Normal compressibility, respiratory  phasicity and response to augmentation. Saphenofemoral Junction: No evidence of thrombus. Normal compressibility and flow on color Doppler imaging. Profunda Femoral Vein: No evidence of thrombus. Normal compressibility and flow on color Doppler imaging. Femoral Vein: No evidence of thrombus. Normal compressibility, respiratory phasicity and response to augmentation. Popliteal Vein: No evidence of thrombus. Normal compressibility, respiratory phasicity and response to augmentation. Calf Veins: No evidence of thrombus. Normal compressibility and flow on color Doppler imaging. Superficial Great Saphenous Vein: No evidence of thrombus. Normal compressibility. Venous Reflux:  None. Other Findings: No evidence of superficial thrombophlebitis or abnormal fluid collection. IMPRESSION: No evidence of deep venous thrombosis in either lower extremity. Electronically Signed   By: Marcey Moan M.D.   On: 05/19/2024 17:20   CT Angio Chest Pulmonary Embolism (PE) W or WO Contrast Result Date: 05/18/2024 CLINICAL DATA:  Concern for pulmonary embolism.  Lymphoma. EXAM: CT ANGIOGRAPHY CHEST WITH CONTRAST TECHNIQUE: Multidetector CT imaging of the chest was performed using the standard protocol during bolus administration of intravenous contrast. Multiplanar CT image reconstructions and MIPs were obtained to evaluate the vascular anatomy. RADIATION DOSE REDUCTION: This exam was performed according to the departmental dose-optimization program which includes automated exposure control, adjustment of the mA and/or kV according to patient size and/or use of iterative reconstruction technique. CONTRAST:  50mL OMNIPAQUE IOHEXOL 350 MG/ML SOLN COMPARISON:  Chest radiograph dated 05/17/2024. FINDINGS: Cardiovascular: There is no cardiomegaly or pericardial effusion. The thoracic aorta is unremarkable. The origins of the great vessels of the aortic arch are patent. Right-sided Port-A-Cath with tip in the right atrium at the cavoatrial  junction. Linear nonocclusive right lower lobe subsegmental pulmonary artery embolus, age indeterminate. No CT evidence of right heart straining. Mediastinum/Nodes: No hilar or mediastinal adenopathy. The esophagus and the thyroid gland are grossly unremarkable no mediastinal fluid collection. Lungs/Pleura: Minimal bibasilar linear atelectasis. No focal consolidation, pleural effusion, or pneumothorax. The central airways are patent. Upper Abdomen: No acute abnormality. Musculoskeletal: Degenerative changes of spine. No acute osseous pathology. Bilateral shoulder arthroplasties. Review of the MIP images confirms the above findings. IMPRESSION: 1. Age indeterminate linear nonocclusive right lower lobe subsegmental pulmonary artery embolus. No CT evidence of right heart straining. 2. No focal consolidation. These results will be called to the ordering clinician or representative by the Radiologist Assistant, and communication documented in the PACS or [employer]. Electronically Signed   By: Vanetta Chou M.D.   On: 05/18/2024 14:56   CT ABDOMEN PELVIS W CONTRAST Result Date: 05/17/2024 CLINICAL DATA:  Abdominal pain.  Concern for kidney stone. EXAM: CT ABDOMEN AND PELVIS WITH CONTRAST TECHNIQUE: Multidetector CT imaging of the abdomen and pelvis was performed using the standard protocol following bolus administration of intravenous contrast. RADIATION DOSE REDUCTION: This exam was performed according to the departmental dose-optimization program which includes automated exposure control, adjustment of the mA and/or kV according to patient size and/or use of iterative reconstruction technique. CONTRAST:  100mL OMNIPAQUE IOHEXOL 300 MG/ML  SOLN COMPARISON:  CT abdomen pelvis dated 04/08/2024. FINDINGS: Lower chest: The visualized lung bases are clear. Tiny nonocclusive embolus in the right lower lobe subsegmental branch (2/2). Chest CT may provide better evaluation. No intra-abdominal free air or free fluid.  Hepatobiliary: The liver is unremarkable. No biliary dilatation. The gallbladder is unremarkable. Pancreas: Unremarkable. No pancreatic ductal dilatation or surrounding inflammatory changes. Spleen: Normal in size without focal abnormality. Adrenals/Urinary Tract: The adrenal glands unremarkable. Left ureteral stent with proximal tip in the inferior pole collecting system and distal end in the urinary bladder. There is mild left hydronephrosis. There is urothelial enhancement of the left renal collecting system and ureter suggestive of ascending UTI. No stone identified. The right kidney, right ureter, and urinary bladder appear unremarkable. Stomach/Bowel: Moderate stool throughout the colon. There is no bowel obstruction or active inflammation. Appendectomy. Vascular/Lymphatic: The abdominal aorta and IVC unremarkable. No portal venous gas. There is no adenopathy. Reproductive: The uterus is grossly unremarkable. Other: Interval decrease in the masslike soft tissues thickening of the left pelvic sidewall measuring approximately 5.8 x 3.3 cm in greatest axial dimensions (previously 6.7 x 4.8 cm). A 1.4 x 1.2 cm rim enhancing low attenuating focus within this soft tissue mass (coronal 52/5) suspicious for an area of phlegmonous change or developing abscess. Musculoskeletal: Degenerative changes of the spine. No acute osseous pathology. IMPRESSION: 1. Left ureteral stent with mild left hydronephrosis and findings suggestive of ascending UTI. Correlation with urinalysis recommended. 2. Interval decrease in the masslike soft tissues thickening of the left pelvic sidewall. A 1.4 x 1.2 cm rim enhancing low attenuating focus within this soft tissue mass suspicious for an area of phlegmonous change or developing abscess. 3. Tiny nonocclusive embolus in the right lower lobe subsegmental branch. Chest CT may provide better evaluation. These results were called by telephone at the time of interpretation on 05/17/2024 at 6:28  pm to  provider LAMAR PRICE , who verbally acknowledged these results. Electronically Signed   By: Vanetta Chou M.D.   On: 05/17/2024 18:39   DG Chest Port 1 View Result Date: 05/17/2024 CLINICAL DATA:  Sepsis. EXAM: PORTABLE CHEST 1 VIEW COMPARISON:  Chest radiograph dated 09/17/2012 FINDINGS: Right-sided PICC with tip over central SVC. No focal consolidation, pleural effusion, pneumothorax. The cardiac silhouette is within limits. No acute osseous pathology. Bilateral shoulder arthroplasties. IMPRESSION: No active disease. Electronically Signed   By: Vanetta Chou M.D.   On: 05/17/2024 16:38   IR IMAGING GUIDED PORT INSERTION Result Date: 05/01/2024 CLINICAL DATA:  Large B-cell lymphoma of the pelvis and need for porta cath to begin chemotherapy. EXAM: IMPLANTED PORT A CATH PLACEMENT WITH ULTRASOUND AND FLUOROSCOPIC GUIDANCE ANESTHESIA/SEDATION: Moderate (conscious) sedation was employed during this procedure. A total of Versed  1.0 mg and Fentanyl  50 mcg was administered intravenously. Moderate Sedation Time: 28 minutes. The patient's level of consciousness and vital signs were monitored continuously by radiology nursing throughout the procedure under my direct supervision. FLUOROSCOPY: Radiation Exposure Index: 1.9 mGy Kerma PROCEDURE: The procedure, risks, benefits, and alternatives were explained to the patient. Questions regarding the procedure were encouraged and answered. The patient understands and consents to the procedure. A time-out was performed prior to initiating the procedure. Ultrasound was utilized to confirm patency of the right internal jugular vein. An ultrasound image was saved and recorded. The right neck and chest were prepped with chlorhexidine  in a sterile fashion, and a sterile drape was applied covering the operative field. Maximum barrier sterile technique with sterile gowns and gloves were used for the procedure. Local anesthesia was provided with 1% lidocaine . After  creating a small venotomy incision, a 21 gauge needle was advanced into the right internal jugular vein under direct, real-time ultrasound guidance. Ultrasound image documentation was performed. After securing guidewire access, an 8 Fr dilator was placed. A J-wire was kinked to measure appropriate catheter length. A subcutaneous port pocket was then created along the upper chest wall utilizing sharp and blunt dissection. Portable cautery was utilized. The pocket was irrigated with sterile saline. A single lumen power injectable port was chosen for placement. The 8 Fr catheter was tunneled from the port pocket site to the venotomy incision. The port was placed in the pocket. External catheter was trimmed to appropriate length based on guidewire measurement. At the venotomy, an 8 Fr peel-away sheath was placed over a guidewire. The catheter was then placed through the sheath and the sheath removed. Final catheter positioning was confirmed and documented with a fluoroscopic spot image. The port was accessed with a needle and aspirated and flushed with heparinized saline. The access needle was removed. The venotomy and port pocket incisions were closed with subcutaneous 3-0 Monocryl and subcuticular 4-0 Vicryl. Dermabond was applied to both incisions. COMPLICATIONS: COMPLICATIONS None FINDINGS: After catheter placement, the tip lies at the cavo-atrial junction. The catheter aspirates normally and is ready for immediate use. IMPRESSION: Placement of single lumen port a cath via right internal jugular vein. The catheter tip lies at the cavo-atrial junction. A power injectable port a cath was placed and is ready for immediate use. Electronically Signed   By: Marcey Moan M.D.   On: 05/01/2024 11:53   CT BONE MARROW BIOPSY & ASPIRATION Result Date: 05/01/2024 CLINICAL DATA:  Large B-cell lymphoma of pelvic sidewall and need for bone marrow biopsy. EXAM: CT GUIDED BONE MARROW ASPIRATION AND BIOPSY ANESTHESIA/SEDATION:  Moderate (conscious) sedation was employed during this procedure.  A total of Versed  2.0 mg and Fentanyl  100 mcg was administered intravenously. Moderate Sedation Time: 10 minutes. The patient's level of consciousness and vital signs were monitored continuously by radiology nursing throughout the procedure under my direct supervision. PROCEDURE: The procedure risks, benefits, and alternatives were explained to the patient. Questions regarding the procedure were encouraged and answered. The patient understands and consents to the procedure. A time out was performed prior to initiating the procedure. The right gluteal region was prepped with chlorhexidine . Sterile gown and sterile gloves were used for the procedure. Local anesthesia was provided with 1% Lidocaine . Under CT guidance, an 11 gauge On Control bone cutting needle was advanced from a posterior approach into the right iliac bone. Needle positioning was confirmed with CT. Initial non heparinized and heparinized aspirate samples were obtained of bone marrow. Core biopsy was performed via the On Control drill needle. COMPLICATIONS: None FINDINGS: Inspection of initial aspirate did reveal visible particles. Intact core biopsy sample was obtained. IMPRESSION: CT guided bone marrow biopsy of right posterior iliac bone with both aspirate and core samples obtained. Electronically Signed   By: Marcey Moan M.D.   On: 05/01/2024 10:41   ECHOCARDIOGRAM COMPLETE Result Date: 04/26/2024    ECHOCARDIOGRAM REPORT   Patient Name:   HAVA MASSINGALE Date of Exam: 04/26/2024 Medical Rec #:  [id number]          Height:       64.0 in Accession #:    [id number]         Weight:       170.3 lb Date of Birth:  1961-04-17          BSA:          1.827 m Patient Age:    63 years           BP:           121/67 mmHg Patient Gender: F                  HR:           54 bpm. Exam Location:  [facility] Procedure: 2D Echo, 3D Echo, Cardiac Doppler, Color Doppler and Strain Analysis             (Both Spectral and Color Flow Doppler were utilized during            procedure). Indications:     Chemo Z09  History:         Patient has no prior history of Echocardiogram examinations.  Sonographer:     Thea Norlander RCS Referring Phys:  [id number] Sadee Osland Diagnosing Phys: Evalene Lunger MD IMPRESSIONS  1. Left ventricular ejection fraction, by estimation, is 55 to 60%. The left ventricle has normal function. The left ventricle has no regional wall motion abnormalities. Left ventricular diastolic parameters are consistent with Grade I diastolic dysfunction (impaired relaxation). The average left ventricular global longitudinal strain is -19.7 %. The global longitudinal strain is normal.  2. Right ventricular systolic function is normal. The right ventricular size is normal.  3. The mitral valve is normal in structure. Mild mitral valve regurgitation. No evidence of mitral stenosis.  4. The aortic valve is normal in structure. Aortic valve regurgitation is not visualized. No aortic stenosis is present.  5. The inferior vena cava is normal in size with greater than 50% respiratory variability, suggesting right atrial pressure of 3 mmHg. FINDINGS  Left Ventricle: Left ventricular ejection fraction, by estimation, is 55 to  60%. The left ventricle has normal function. The left ventricle has no regional wall motion abnormalities. The average left ventricular global longitudinal strain is -19.7 %. Strain was performed and the global longitudinal strain is normal. The left ventricular internal cavity size was normal in size. There is no left ventricular hypertrophy. Left ventricular diastolic parameters are consistent with Grade I diastolic dysfunction (impaired relaxation). Right Ventricle: The right ventricular size is normal. No increase in right ventricular wall thickness. Right ventricular systolic function is normal. Left Atrium: Left atrial size was normal in size. Right Atrium: Right atrial size was normal in  size. Pericardium: There is no evidence of pericardial effusion. Mitral Valve: The mitral valve is normal in structure. Mild mitral valve regurgitation. No evidence of mitral valve stenosis. Tricuspid Valve: The tricuspid valve is normal in structure. Tricuspid valve regurgitation is trivial. No evidence of tricuspid stenosis. Aortic Valve: The aortic valve is normal in structure. Aortic valve regurgitation is not visualized. No aortic stenosis is present. Aortic valve peak gradient measures 5.2 mmHg. Pulmonic Valve: The pulmonic valve was normal in structure. Pulmonic valve regurgitation is not visualized. No evidence of pulmonic stenosis. Aorta: The aortic root is normal in size and structure. Venous: The inferior vena cava is normal in size with greater than 50% respiratory variability, suggesting right atrial pressure of 3 mmHg. IAS/Shunts: No atrial level shunt detected by color flow Doppler. Additional Comments: 3D was performed not requiring image post processing on an independent workstation and was indeterminate.  LEFT VENTRICLE PLAX 2D LVIDd:         4.10 cm   Diastology LVIDs:         3.10 cm   LV e' medial:  6.96 cm/s LV PW:         0.80 cm   LV e' lateral: 9.14 cm/s LV IVS:        0.70 cm LVOT diam:     2.40 cm   2D Longitudinal Strain LV SV:         79        2D Strain GLS Avg:     -19.7 % LV SV Index:   43 LVOT Area:     4.52 cm                           3D Volume EF:                          3D EF:        58 %                          LV EDV:       129 ml                          LV ESV:       55 ml                          LV SV:        74 ml RIGHT VENTRICLE             IVC RV S prime:     13.20 cm/s  IVC diam: 1.20 cm TAPSE (M-mode): 2.3 cm LEFT ATRIUM             Index  RIGHT ATRIUM           Index LA diam:        3.30 cm 1.81 cm/m   RA Area:     13.40 cm LA Vol (A2C):   35.3 ml 19.32 ml/m  RA Volume:   27.80 ml  15.21 ml/m LA Vol (A4C):   20.4 ml 11.16 ml/m LA Biplane Vol: 28.9 ml  15.82 ml/m  AORTIC VALVE AV Area (Vmax): 3.59 cm AV Vmax:        114.00 cm/s AV Peak Grad:   5.2 mmHg LVOT Vmax:      90.40 cm/s LVOT Vmean:     56.100 cm/s LVOT VTI:       0.175 m  AORTA Ao Root diam: 3.10 cm Ao Asc diam:  3.30 cm TRICUSPID VALVE TR Peak grad:   7.7 mmHg TR Vmax:        139.00 cm/s  SHUNTS Systemic VTI:  0.18 m Systemic Diam: 2.40 cm Evalene Lunger MD Electronically signed by Evalene Lunger MD Signature Date/Time: 04/26/2024/2:00:19 PM    Final    NM PET Image Initial (PI) Skull Base To Thigh (F-18 FDG) Result Date: 04/25/2024 CLINICAL DATA:  Initial treatment strategy for new diagnosis of lymphoma. Known left pelvic soft tissue mass. EXAM: NUCLEAR MEDICINE PET SKULL BASE TO THIGH TECHNIQUE: 9.4 mCi F-18 FDG was injected intravenously. Full-ring PET imaging was performed from the skull base to thigh after the radiotracer. CT data was obtained and used for attenuation correction and anatomic localization. Fasting blood glucose: 86 mg/dl COMPARISON:  Abdominopelvic CTA and CT of 04/08/2024 FINDINGS: Mediastinal blood pool activity: SUV max 3.5 Liver activity: SUV max 4.0 NECK: Extensive hypermetabolic brown fat throughout the neck and upper chest. Given this limitation, no cervical nodal hypermetabolism identified. Incidental CT findings: Left carotid atherosclerosis. No cervical adenopathy. CHEST: No pulmonary parenchymal or thoracic nodal hypermetabolism. Incidental CT findings: Aortic and coronary artery calcification. Tiny right pleural effusion is new since the prior CTs. ABDOMEN/PELVIS: Mild splenic hypermetabolism relative to the liver (SUV 4.6). No splenomegaly. The posterior left pelvic sidewall mass on prior CTA is hypermetabolic. Difficult to measure on noncontrast CT, estimated at 4.5 x 3.4 cm and a S.U.V. max of 14.1 on 127/6. No separate areas of abdominopelvic nodal hypermetabolism. Incidental CT findings: Left ureteric stent in place, without significant hydronephrosis.  SKELETON: No abnormal marrow activity. Incidental CT findings: Mild degenerative changes of both hips. Bilateral shoulder arthroplasties. IMPRESSION: 1. The posterior left pelvic sidewall mass is hypermetabolic, consistent with active lymphoma. 2. Mild splenic hypermetabolism relative to the liver. No splenomegaly. Cannot exclude lymphomatous involvement. 3. New small right pleural effusion. 4. No active lymphoma within the chest or neck. Mildly decreased sensitivity exam secondary to hypermetabolic brown fat. 5. Age advanced coronary artery atherosclerosis. Recommend assessment of coronary risk factors. 6.  Aortic Atherosclerosis (ICD10-I70.0). Electronically Signed   By: Rockey Kilts M.D.   On: 04/25/2024 08:59    Assessment and plan-   # Sepsis, in the context of chemotherapy induced neutropenia, complicated UTI due to stent Patient has received long-acting G-CSF after cycle 1 day 1 chemotherapy.  No need for short acting G-CSF.  Consider prophylactic antibiotics during future cycles of chemotherapy. CT abdomen pelvis showed decrease of pelvic mass, indicating good response to treatment.  Rim-enhancing low attenuating focus within the soft tissue mass, suspect due to tumor central necrosis.  Afebrile  Urine culture positive for enterococcus. Continue IV antibiotics- narrowed down to Unasyn per ID recommendation.  Appreciate urology recommendation of stent management Continue oxybutynin and tamsulosin, Pyridium   # Small age-indeterminate pulmonary embolism.   Platelet has improved, above 50,000 Ok to resume anticoagulation and switch to Eliquis    # Thrombocytopenia, chemotherapy related.  Monitor. improved  # Sciatic pain/neuropathic pain.   Pre existing prior to she got chemotherapy. Left hip/buttock pain has improved with worse of calf and ankle symptoms ? Nerve compression or acute chemotherapy neuro toxicity Recommend to increase gabapentin  to 400 mg TID  Continue current pain  regimen.  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  S/p cycle 1 R-CHOP. Continue allopurinol for tumor lysis prophylaxis. .    Thank you for allowing me to participate in the care of this patient.   Zelphia Cap, MD, PhD Hematology Oncology 05/22/2024

## 2024-05-22 NOTE — Plan of Care (Signed)
  Problem: Health Behavior/Discharge Planning: Goal: Ability to manage health-related needs will improve Outcome: Progressing   Problem: Clinical Measurements: Goal: Diagnostic test results will improve Outcome: Progressing   Problem: Nutrition: Goal: Adequate nutrition will be maintained Outcome: Progressing   Problem: Coping: Goal: Level of anxiety will decrease Outcome: Progressing   Problem: Safety: Goal: Ability to remain free from injury will improve Outcome: Progressing   Problem: Clinical Measurements: Goal: Signs and symptoms of infection will decrease Outcome: Progressing

## 2024-05-22 NOTE — Progress Notes (Signed)
 Progress Note    Afsa Meany  FMW:[id number] DOB: Sep 04, 1960  DOA: 05/17/2024 PCP: [name], PA-C      Brief Narrative:    Medical records reviewed and are as summarized below:  Lynore Coscia is a 63 y.o. female with medical history significant for diffuse large B-cell lymphoma on chemotherapy followed by Dr. Babara, left ureteral stent, GERD, who presented to the ED because of lower abdominal pain, bilateral low back pain, fever and dysuria.  Of note in September 2025 CT angiogram showed an obstructing mass along the left pelvic sidewall causing hydroureter and hydronephrosis. At that time stent placed by urologist   She was admitted to the hospital for neutropenic fever, sepsis secondary to acute complicated UTI.    Assessment/Plan:   Principal Problem:   Febrile neutropenia Active Problems:   Sepsis (HCC)   Pulmonary embolism (HCC)   Urinary tract infection without hematuria   Neutropenia    Body mass index is 27.46 kg/m.   Sepsis secondary to acute complicated UTI, neutropenic fever, immunocompromised: Continue IV Unasyn.  Continue acyclovir for prophylaxis.  Follow-up with ID specialist for further recommendations.   Left pelvic mass, left hydronephrosis, s/p left ureteral stent in September 2025: She has been evaluated by the urologist and outpatient follow-up with recommended.  Continue Flomax, oxybutynin and Pyridium   Acute right lower lobe pulmonary embolism: Thrombocytopenia is improving.  Start Eliquis as recommended by oncologist.  Genna to start Eliquis if platelet count greater than 50,000.   Sinus tachycardia: Heart rate up into the 140s with activity.  Continue to monitor.   Pancytopenia: Leukopenia and platelet count are improving.  Hemoglobin stable: Recently treated with a long-acting G-CSF after cycle 1 day 1 chemotherapy per oncologist.  Monitor CBC.   Left lower extremity pain, swelling, numbness and tingling: Venous  duplex of the lower extremities was negative for DVT.  Pain likely neuropathic in nature.  This may be related to sciatica   Diffuse large B-cell lymphoma: Recent R-CHOP about a week prior to admission.  Follow-up with oncologist.   Comorbidities include gout, GERD, osteoarthritis, chronic pain  Diet Order             Diet regular Room service appropriate? Yes; Fluid consistency: Thin  Diet effective now                                  Consultants: ID specialist Oncologist Urologist  Procedures: None    Medications:    acyclovir  400 mg Oral Daily   allopurinol  300 mg Oral Daily   apixaban  10 mg Oral BID   Followed by   NOREEN ON 05/29/2024] apixaban  5 mg Oral BID   chlorhexidine   15 mL Mouth/Throat BID   Chlorhexidine  Gluconate Cloth  6 each Topical Daily   feeding supplement  237 mL Oral BID BM   fentaNYL   1 patch Transdermal Q72H   gabapentin   400 mg Oral TID   loratadine  10 mg Oral Daily   oxybutynin  10 mg Oral Daily   pantoprazole  40 mg Oral Daily   sodium chloride  flush  10-40 mL Intracatheter Q12H   sodium chloride  flush  3 mL Intravenous Q12H   tamsulosin  0.4 mg Oral Daily   Continuous Infusions:  ampicillin-sulbactam (UNASYN) IV 3 g (05/22/24 1516)     Anti-infectives (From admission, onward)    Start  Dose/Rate Route Frequency Ordered Stop   05/19/24 2200  Ampicillin-Sulbactam (UNASYN) 3 g in sodium chloride  0.9 % 100 mL IVPB        3 g 200 mL/hr over 30 Minutes Intravenous Every 6 hours 05/19/24 1525     05/18/24 2200  meropenem (MERREM) 1 g in sodium chloride  0.9 % 100 mL IVPB  Status:  Discontinued        1 g 200 mL/hr over 30 Minutes Intravenous Every 8 hours 05/18/24 2113 05/19/24 1525   05/18/24 1000  acyclovir (ZOVIRAX) 200 MG capsule 400 mg        400 mg Oral Daily 05/18/24 0225     05/17/24 2200  ceFEPIme (MAXIPIME) 2 g in sodium chloride  0.9 % 100 mL IVPB  Status:  Discontinued        2 g 200 mL/hr over  30 Minutes Intravenous Every 8 hours 05/17/24 1953 05/18/24 2113   05/17/24 2130  ceFEPIme (MAXIPIME) 2 g in sodium chloride  0.9 % 100 mL IVPB  Status:  Discontinued        2 g 200 mL/hr over 30 Minutes Intravenous  Once 05/17/24 2043 05/17/24 2045   05/17/24 1615  cefTRIAXone (ROCEPHIN) 2 g in sodium chloride  0.9 % 100 mL IVPB        2 g 200 mL/hr over 30 Minutes Intravenous  Once 05/17/24 1602 05/17/24 1737              Family Communication/Anticipated D/C date and plan/Code Status   DVT prophylaxis: SCDs Start: 05/17/24 2043 apixaban (ELIQUIS) tablet 10 mg  apixaban (ELIQUIS) tablet 5 mg     Code Status: Full Code  Family Communication: None Disposition Plan: Plan to discharge home   Status is: Inpatient Remains inpatient appropriate because: Acute PE, sepsis from complicated UTI, acute UTI       Subjective:   Interval events noted.  No new complaints.  She still has pain and numbness in the left leg.  Left flank pain has improved.  No chest pain or shortness of breath.  Heart rate went up into the 140s when she worked with PT this morning.  Objective:    Vitals:   05/21/24 2350 05/22/24 0424 05/22/24 0754 05/22/24 1146  BP: 137/62 (!) 138/54 (!) 147/56 120/80  Pulse: 89 84 86 (!) 109  Resp: 18 18    Temp: 98 F (36.7 C) 98.4 F (36.9 C) 98.6 F (37 C) 98.7 F (37.1 C)  TempSrc:   Axillary Axillary  SpO2: 94% 93% 96% 96%  Weight:      Height:       No data found.   Intake/Output Summary (Last 24 hours) at 05/22/2024 1602 Last data filed at 05/22/2024 9082 Gross per 24 hour  Intake 260 ml  Output 700 ml  Net -440 ml   Filed Weights   05/17/24 1509  Weight: 72.6 kg    Exam:  GEN: NAD SKIN: Warm and dry EYES: No pallor or icterus ENT: MMM CV: RRR PULM: CTA B ABD: soft, ND, NT, +BS CNS: AAO x 3, non focal EXT: No edema or tenderness GU: No CVA tenderness       Data Reviewed:   I have personally reviewed following labs and  imaging studies:  Labs: Labs show the following:   Basic Metabolic Panel: Recent Labs  Lab 05/17/24 1613 05/17/24 2136 05/18/24 0425 05/20/24 0531 05/21/24 0514 05/22/24 0447  NA 130*  --  134* 137 137 138  K 4.0  --  3.6 3.4* 3.5 3.7  CL 95*  --  101 101 101 100  CO2 24  --  27 28 28 28   GLUCOSE 154*  --  129* 107* 103* 105*  BUN 12  --  10 12 9 11   CREATININE 0.43*  --  0.44 0.51 0.47 0.30*  CALCIUM 9.0  --  8.4* 8.2* 8.1* 8.2*  MG  --  2.0  --   --   --   --   PHOS  --  3.3  --  2.8  --   --    GFR Estimated Creatinine Clearance: 72.2 mL/min (A) (by C-G formula based on SCr of 0.3 mg/dL (L)). Liver Function Tests: Recent Labs  Lab 05/17/24 1613 05/18/24 0425 05/20/24 0531  AST 16 12*  --   ALT 10 7  --   ALKPHOS 64 49  --   BILITOT 1.3* 0.9  --   PROT 7.5 5.9*  --   ALBUMIN 3.9 3.0* 2.7*   No results for input(s): LIPASE, AMYLASE in the last 168 hours. No results for input(s): AMMONIA in the last 168 hours. Coagulation profile Recent Labs  Lab 05/17/24 1613  INR 0.9    CBC: Recent Labs  Lab 05/17/24 1613 05/18/24 0425 05/19/24 0130 05/20/24 0531 05/21/24 0514 05/22/24 0447  WBC 0.1* 0.3* 0.2* 0.4* 1.2* 2.7*  NEUTROABS 0.0*  --   --   --   --   --   HGB 12.5 10.6* 9.1* 9.2* 9.4* 8.8*  HCT 35.6* 31.1* 26.7* 27.7* 27.9* 26.4*  MCV 88.1 90.7 89.9 91.4 91.2 91.3  PLT 58* 43* 26* 28* 42* 67*   Cardiac Enzymes: No results for input(s): CKTOTAL, CKMB, CKMBINDEX, TROPONINI in the last 168 hours. BNP (last 3 results) No results for input(s): PROBNP in the last 8760 hours. CBG: No results for input(s): GLUCAP in the last 168 hours. D-Dimer: No results for input(s): DDIMER in the last 72 hours. Hgb A1c: No results for input(s): HGBA1C in the last 72 hours. Lipid Profile: No results for input(s): CHOL, HDL, LDLCALC, TRIG, CHOLHDL, LDLDIRECT in the last 72 hours. Thyroid function studies: No results for input(s):  TSH, T4TOTAL, T3FREE, THYROIDAB in the last 72 hours.  Invalid input(s): FREET3 Anemia work up: No results for input(s): VITAMINB12, FOLATE, FERRITIN, TIBC, IRON, RETICCTPCT in the last 72 hours. Sepsis Labs: Recent Labs  Lab 05/17/24 1613 05/17/24 1917 05/18/24 0425 05/19/24 0130 05/20/24 0531 05/21/24 0514 05/22/24 0447  WBC 0.1*  --    < > 0.2* 0.4* 1.2* 2.7*  LATICACIDVEN 1.7 0.9  --   --   --   --   --    < > = values in this interval not displayed.    Microbiology Recent Results (from the past 240 hours)  Urine Culture     Status: Abnormal   Collection Time: 05/17/24  3:58 PM   Specimen: Urine, Clean Catch  Result Value Ref Range Status   Specimen Description   Final    URINE, CLEAN CATCH Performed at [facility], [address], [place], [place] [address]    Special Requests   Final    NONE Reflexed from [id number] Performed at [facility], [address] [place], [place] [address]    Culture >=100,000 COLONIES/mL ENTEROCOCCUS FAECALIS (A)  Final   Report Status 05/20/2024 FINAL  Final   Organism ID, Bacteria ENTEROCOCCUS FAECALIS (A)  Final      Susceptibility   Enterococcus faecalis -  MIC*    AMPICILLIN <=2 SENSITIVE Sensitive     NITROFURANTOIN <=16 SENSITIVE Sensitive     VANCOMYCIN  1 SENSITIVE Sensitive     * >=100,000 COLONIES/mL ENTEROCOCCUS FAECALIS  Blood Culture (routine x 2)     Status: None   Collection Time: 05/17/24  4:13 PM   Specimen: BLOOD  Result Value Ref Range Status   Specimen Description BLOOD BLOOD RIGHT FOREARM  Final   Special Requests   Final    BOTTLES DRAWN AEROBIC AND ANAEROBIC Blood Culture results may not be optimal due to an inadequate volume of blood received in culture bottles   Culture   Final    NO GROWTH 5 DAYS Performed at [facility], [address] [place], [place] [address]    Report Status 05/22/2024 FINAL  Final  Blood Culture (routine x 2)     Status: None    Collection Time: 05/17/24  4:13 PM   Specimen: BLOOD  Result Value Ref Range Status   Specimen Description BLOOD LEFT ANTECUBITAL  Final   Special Requests   Final    BOTTLES DRAWN AEROBIC AND ANAEROBIC Blood Culture results may not be optimal due to an inadequate volume of blood received in culture bottles   Culture   Final    NO GROWTH 5 DAYS Performed at [facility], [address] [place], [place] [address]    Report Status 05/22/2024 FINAL  Final  Resp panel by RT-PCR (RSV, Flu A&B, Covid) Anterior Nasal Swab     Status: None   Collection Time: 05/17/24  8:09 PM   Specimen: Anterior Nasal Swab  Result Value Ref Range Status   SARS Coronavirus 2 by RT PCR NEGATIVE NEGATIVE Final    Comment: (NOTE) SARS-CoV-2 target nucleic acids are NOT DETECTED.  The SARS-CoV-2 RNA is generally detectable in upper respiratory specimens during the acute phase of infection. The lowest concentration of SARS-CoV-2 viral copies this assay can detect is 138 copies/mL. A negative result does not preclude SARS-Cov-2 infection and should not be used as the sole basis for treatment or other patient management decisions. A negative result may occur with  improper specimen collection/handling, submission of specimen other than nasopharyngeal swab, presence of viral mutation(s) within the areas targeted by this assay, and inadequate number of viral copies(<138 copies/mL). A negative result must be combined with clinical observations, patient history, and epidemiological information. The expected result is Negative.  Fact Sheet for Patients:  bloggercourse.com  Fact Sheet for Healthcare Providers:  seriousbroker.it  This test is no t yet approved or cleared by the [place]  FDA and  has been authorized for detection and/or diagnosis of SARS-CoV-2 by FDA under an Emergency Use Authorization (EUA). This EUA will remain  in effect (meaning  this test can be used) for the duration of the COVID-19 declaration under Section 564(b)(1) of the Act, 21 U.S.C.section 360bbb-3(b)(1), unless the authorization is terminated  or revoked sooner.       Influenza A by PCR NEGATIVE NEGATIVE Final   Influenza B by PCR NEGATIVE NEGATIVE Final    Comment: (NOTE) The Xpert Xpress SARS-CoV-2/FLU/RSV plus assay is intended as an aid in the diagnosis of influenza from Nasopharyngeal swab specimens and should not be used as a sole basis for treatment. Nasal washings and aspirates are unacceptable for Xpert Xpress SARS-CoV-2/FLU/RSV testing.  Fact Sheet for Patients: bloggercourse.com  Fact Sheet for Healthcare Providers: seriousbroker.it  This test is not yet approved or cleared by the [place]   FDA and has been authorized for detection and/or diagnosis of SARS-CoV-2 by FDA under an Emergency Use Authorization (EUA). This EUA will remain in effect (meaning this test can be used) for the duration of the COVID-19 declaration under Section 564(b)(1) of the Act, 21 U.S.C. section 360bbb-3(b)(1), unless the authorization is terminated or revoked.     Resp Syncytial Virus by PCR NEGATIVE NEGATIVE Final    Comment: (NOTE) Fact Sheet for Patients: bloggercourse.com  Fact Sheet for Healthcare Providers: seriousbroker.it  This test is not yet approved or cleared by the [place]  FDA and has been authorized for detection and/or diagnosis of SARS-CoV-2 by FDA under an Emergency Use Authorization (EUA). This EUA will remain in effect (meaning this test can be used) for the duration of the COVID-19 declaration under Section 564(b)(1) of the Act, 21 U.S.C. section 360bbb-3(b)(1), unless the authorization is terminated or revoked.  Performed at [facility], [address] [place], [place] [address]     Procedures and  diagnostic studies:  No results found.              LOS: 5 days   Marissa Weaver  [facility]   Pager on [email or web address]. If 7PM-7AM, please contact night-coverage at [email or web address]     05/22/2024, 4:02 PM

## 2024-05-22 NOTE — Progress Notes (Signed)
 PHARMACY - ANTICOAGULATION CONSULT NOTE  Pharmacy Consult for apixaban Indication: pulmonary embolus  Allergies  Allergen Reactions   Propofol  Nausea Only    Patient states medication can be tolerated with an antiemetic before taking    Patient Measurements: Height: 5' 4 (162.6 cm) Weight: 72.6 kg (160 lb) IBW/kg (Calculated) : 54.7 HEPARIN DW (KG): 69.6  Vital Signs: Temp: 98.6 F (37 C) (11/10 0754) Temp Source: Axillary (11/10 0754) BP: 147/56 (11/10 0754) Pulse Rate: 86 (11/10 0754)  Labs: Recent Labs    05/20/24 0531 05/21/24 0514 05/22/24 0447  HGB 9.2* 9.4* 8.8*  HCT 27.7* 27.9* 26.4*  PLT 28* 42* 67*  HEPARINUNFRC <0.10*  --   --   CREATININE 0.51 0.47 0.30*    Estimated Creatinine Clearance: 72.2 mL/min (A) (by C-G formula based on SCr of 0.3 mg/dL (L)).   Medical History: Past Medical History:  Diagnosis Date   Arthritis    left shoulder, neck, lower back  (01/11/2018)   Chronic lower back pain    Family history of adverse reaction to anesthesia    Mother has nausea   GERD (gastroesophageal reflux disease)    PONV (postoperative nausea and vomiting)    Pre-diabetes    UTI (urinary tract infection) 03/20/2021   Assessment: 63 y/o female presenting with abdominal pain, fever, and dysuria. PMH significant for diffuse large B-cell lymphoma on chemotherapy followed by Dr. Babara, left ureteral stent, GERD. CTA on 11/9 showed age-indeterminate non-occlusive RLL pulmonary embolus. Patient was initially started on heparin infusion, but was discontinued per oncology due to platelet count of 26. Pharmacy has been consulted to initiate apixaban.  Baseline labs: hgb 8.8, plt 67, INR 0.9  Goal of Therapy:  Monitor platelets by anticoagulation protocol: Yes   Plan:  Start apixaban 10 mg BID for 7 days, then transition to apixaban 5 mg BID thereafter Continue to monitor CBC daily while inpatient - watch platelet count closely  Thank you for involving pharmacy  in this patient's care.   Damien Napoleon, PharmD Clinical Pharmacist 05/22/2024 11:00 AM

## 2024-05-22 NOTE — TOC Progression Note (Signed)
 Transition of Care [facility]) - Progression Note    Patient Details  Name: Stacey [name] MRN: [id number] Date of Birth: January 04, 1961  Transition of Care [facility]) CM/SW Contact  Daved JONETTA Hamilton, RN Phone Number: 05/22/2024, 4:24 PM  Clinical Narrative:     Spoke with [name] regarding insurance coverage for therapy recommended DME. Patients insurance is out of network and will not be covered. Stacey [name] verbalized understanding and discussed other options for paying out of pocket for the recommended items.   TOC will continue to follow.  Expected Discharge Plan: Home/Self Care Barriers to Discharge: Continued Medical Work up               Expected Discharge Plan and Services   Discharge Planning Services: CM Consult   Living arrangements for the past 2 months: Single Family Home                                       Social Drivers of Health (SDOH) Interventions SDOH Screenings   Food Insecurity: No Food Insecurity (05/17/2024)  Housing: Low Risk  (05/17/2024)  Transportation Needs: No Transportation Needs (05/17/2024)  Utilities: Not At Risk (05/17/2024)  Depression (PHQ2-9): Low Risk  (05/12/2024)  Financial Resource Strain: Low Risk  (03/30/2024)   Received from [facility]  Social Connections: Socially Integrated (05/17/2024)  Tobacco Use: Low Risk  (05/17/2024)    Readmission Risk Interventions     No data to display

## 2024-05-22 NOTE — Progress Notes (Signed)
 Date of Admission:  05/17/2024     ID: Stacey [name] is a 63 y.o. female  Principal Problem:   Febrile neutropenia Active Problems:   Sepsis (HCC)   Pulmonary embolism (HCC)   Urinary tract infection without hematuria   Neutropenia    Subjective: Pt is doing better Minimal dysuria No fever  Medications:   acyclovir  400 mg Oral Daily   allopurinol  300 mg Oral Daily   apixaban  10 mg Oral BID   Followed by   NOREEN ON 05/29/2024] apixaban  5 mg Oral BID   chlorhexidine   15 mL Mouth/Throat BID   Chlorhexidine  Gluconate Cloth  6 each Topical Daily   feeding supplement  237 mL Oral BID BM   fentaNYL   1 patch Transdermal Q72H   gabapentin   300 mg Oral TID   loratadine  10 mg Oral Daily   oxybutynin  10 mg Oral Daily   pantoprazole  40 mg Oral Daily   sodium chloride  flush  10-40 mL Intracatheter Q12H   sodium chloride  flush  3 mL Intravenous Q12H   tamsulosin  0.4 mg Oral Daily    Objective: Vital signs in last 24 hours: Patient Vitals for the past 24 hrs:  BP Temp Temp src Pulse Resp SpO2  05/22/24 1146 120/80 98.7 F (37.1 C) Axillary (!) 109 -- 96 %  05/22/24 0754 (!) 147/56 98.6 F (37 C) Axillary 86 -- 96 %  05/22/24 0424 (!) 138/54 98.4 F (36.9 C) -- 84 18 93 %  05/21/24 2350 137/62 98 F (36.7 C) -- 89 18 94 %  05/21/24 2019 (!) 127/57 98.6 F (37 C) -- 84 18 95 %  05/21/24 1710 (!) 150/62 99.3 F (37.4 C) Axillary 91 -- 96 %  05/21/24 1228 (!) 153/65 99.1 F (37.3 C) -- 87 16 95 %     Lines and Device Date on insertion # of days DC  Engineer, Technical Sales     ETT       PHYSICAL EXAM:  General: Alert, cooperative, no distress, appears stated age.  Lungs: Clear to auscultation bilaterally. No Wheezing or Rhonchi. No rales. Heart: Regular rate and rhythm, no murmur, rub or gallop. Abdomen: Soft, non-tender,not distended. Bowel sounds normal. No masses Extremities: atraumatic, no cyanosis. No edema. No clubbing Skin: No  rashes or lesions. Or bruising Lymph: Cervical, supraclavicular normal. Neurologic: Grossly non-focal  Lab Results    Latest Ref Rng & Units 05/22/2024    4:47 AM 05/21/2024    5:14 AM 05/20/2024    5:31 AM  CBC  WBC 4.0 - 10.5 K/uL 2.7  1.2  0.4   Hemoglobin 12.0 - 15.0 g/dL 8.8  9.4  9.2   Hematocrit 36.0 - 46.0 % 26.4  27.9  27.7   Platelets 150 - 400 K/uL 67  42  28        Latest Ref Rng & Units 05/22/2024    4:47 AM 05/21/2024    5:14 AM 05/20/2024    5:31 AM  CMP  Glucose 70 - 99 mg/dL 894  896  892   BUN 8 - 23 mg/dL 11  9  12    Creatinine 0.44 - 1.00 mg/dL 9.69  9.52  9.48   Sodium 135 - 145 mmol/L 138  137  137   Potassium 3.5 - 5.1 mmol/L 3.7  3.5  3.4   Chloride 98 - 111 mmol/L 100  101  101  CO2 22 - 32 mmol/L 28  28  28    Calcium 8.9 - 10.3 mg/dL 8.2  8.1  8.2       Microbiology: 05/17/24 BC - NG Studies/Results: No results found.   Assessment/Plan: Febrile neutropenia- resolved  Complicated UTI Pt has left ureteric stent for hydronephrosis secondary to left pelvic mass. Stent placed on 04/08/24 Enterococcus faecalis in urine culture On unasyn- will treat for a total of 10 days- on discharge can do po Amoxicillin 1 gram TID 05/27/24   Diffuse large b cell lymphoma in the left pelvic area- no other sites- possible spleen involvement Has received 1 dose of R-CHOP There is some phlegmon/small abscess in the center of the mass- could be necrosis due to chemo-    PORT -    Anemia Thrombocytopenia due to chemo- better   On acyclovir prophylaxis   Rt lower lobe subsegmental PE   Left leg edema secondary to the tumor possibly Recommend Screening for DVT neg   This consult involved complex antimicrobial management  Discussed the management with the patient and her husband  ID will sign off- call if needed

## 2024-05-23 ENCOUNTER — Telehealth: Payer: Self-pay

## 2024-05-23 DIAGNOSIS — D709 Neutropenia, unspecified: Secondary | ICD-10-CM | POA: Diagnosis not present

## 2024-05-23 DIAGNOSIS — G629 Polyneuropathy, unspecified: Secondary | ICD-10-CM

## 2024-05-23 DIAGNOSIS — R5081 Fever presenting with conditions classified elsewhere: Secondary | ICD-10-CM | POA: Diagnosis not present

## 2024-05-23 LAB — CBC WITH DIFFERENTIAL/PLATELET
Abs Immature Granulocytes: 0.37 K/uL — ABNORMAL HIGH (ref 0.00–0.07)
Basophils Absolute: 0 K/uL (ref 0.0–0.1)
Basophils Relative: 0 %
Eosinophils Absolute: 0 K/uL (ref 0.0–0.5)
Eosinophils Relative: 0 %
HCT: 26.5 % — ABNORMAL LOW (ref 36.0–46.0)
Hemoglobin: 8.6 g/dL — ABNORMAL LOW (ref 12.0–15.0)
Immature Granulocytes: 15 %
Lymphocytes Relative: 19 %
Lymphs Abs: 0.5 K/uL — ABNORMAL LOW (ref 0.7–4.0)
MCH: 30.5 pg (ref 26.0–34.0)
MCHC: 32.5 g/dL (ref 30.0–36.0)
MCV: 94 fL (ref 80.0–100.0)
Monocytes Absolute: 0.4 K/uL (ref 0.1–1.0)
Monocytes Relative: 17 %
Neutro Abs: 1.2 K/uL — ABNORMAL LOW (ref 1.7–7.7)
Neutrophils Relative %: 49 %
Platelets: 101 K/uL — ABNORMAL LOW (ref 150–400)
RBC: 2.82 MIL/uL — ABNORMAL LOW (ref 3.87–5.11)
RDW: 12.5 % (ref 11.5–15.5)
Smear Review: NORMAL
WBC: 2.5 K/uL — ABNORMAL LOW (ref 4.0–10.5)
nRBC: 0.8 % — ABNORMAL HIGH (ref 0.0–0.2)

## 2024-05-23 NOTE — Progress Notes (Addendum)
 Progress Note    Lenoir Facchini  FMW:[id number] DOB: [birth year]  DOA: 05/17/2024 PCP: [name], PA-C      Brief Narrative:    Medical records reviewed and are as summarized below:  Darthy Manganelli is a 63 y.o. female with medical history significant for diffuse large B-cell lymphoma on chemotherapy followed by Dr. Babara, left ureteral stent, GERD, who presented to the ED because of lower abdominal pain, bilateral low back pain, fever and dysuria.  Of note in September 2025 CT angiogram showed an obstructing mass along the left pelvic sidewall causing hydroureter and hydronephrosis. At that time stent placed by urologist   She was admitted to the hospital for neutropenic fever, sepsis secondary to acute complicated UTI.    Assessment/Plan:   Principal Problem:   Neutropenic fever Active Problems:   Sepsis (HCC)   Pulmonary embolism (HCC)   Urinary tract infection without hematuria   Neutropenia   Thrombocytopenia    Body mass index is 27.46 kg/m.   Sepsis secondary to acute complicated UTI, neutropenic fever, immunocompromised: Continue IV Unasyn for now.  Plan to switch to amoxicillin 1 g 3 times daily through 05/27/2024.  Continue acyclovir for prophylaxis.  Follow-up with ID specialist for further recommendations.   Left pelvic mass, left hydronephrosis, s/p left ureteral stent in September 2025: She has been evaluated by the urologist and outpatient follow-up with recommended.  Continue Flomax, oxybutynin and Pyridium   Acute right lower lobe pulmonary embolism: Continue Eliquis.   Sinus tachycardia associated with lightheadedness: Heart rate went up into the 140s when she worked with PT today.  Continue to monitor. Discussed blood transfusion.  However, patient declines blood transfusion for religious reasons because she is Jehovah's Witness.   Pancytopenia: Leukopenia and platelet count are improving.  Hemoglobin stable: Recently treated with a  long-acting G-CSF after cycle 1 day 1 chemotherapy per oncologist.  Monitor CBC.   Left lower extremity pain, swelling, numbness and tingling: Venous duplex of the lower extremities was negative for DVT.  Pain likely neuropathic in nature.  This may be related to sciatica   Diffuse large B-cell lymphoma: Recent R-CHOP about a week prior to admission.  Follow-up with oncologist.   Comorbidities include gout, GERD, osteoarthritis, chronic pain  Diet Order             Diet regular Room service appropriate? Yes; Fluid consistency: Thin  Diet effective now                                  Consultants: ID specialist Oncologist Urologist  Procedures: None    Medications:    acyclovir  400 mg Oral Daily   allopurinol  300 mg Oral Daily   apixaban  10 mg Oral BID   Followed by   NOREEN ON 05/29/2024] apixaban  5 mg Oral BID   chlorhexidine   15 mL Mouth/Throat BID   Chlorhexidine  Gluconate Cloth  6 each Topical Daily   feeding supplement  237 mL Oral BID BM   fentaNYL   1 patch Transdermal Q72H   gabapentin   400 mg Oral TID   loratadine  10 mg Oral Daily   oxybutynin  10 mg Oral Daily   pantoprazole  40 mg Oral Daily   sodium chloride  flush  10-40 mL Intracatheter Q12H   sodium chloride  flush  3 mL Intravenous Q12H   tamsulosin  0.4 mg Oral Daily  Continuous Infusions:  ampicillin-sulbactam (UNASYN) IV Stopped (05/23/24 1108)     Anti-infectives (From admission, onward)    Start     Dose/Rate Route Frequency Ordered Stop   05/19/24 2200  Ampicillin-Sulbactam (UNASYN) 3 g in sodium chloride  0.9 % 100 mL IVPB        3 g 200 mL/hr over 30 Minutes Intravenous Every 6 hours 05/19/24 1525     05/18/24 2200  meropenem (MERREM) 1 g in sodium chloride  0.9 % 100 mL IVPB  Status:  Discontinued        1 g 200 mL/hr over 30 Minutes Intravenous Every 8 hours 05/18/24 2113 05/19/24 1525   05/18/24 1000  acyclovir (ZOVIRAX) 200 MG capsule 400 mg        400 mg  Oral Daily 05/18/24 0225     05/17/24 2200  ceFEPIme (MAXIPIME) 2 g in sodium chloride  0.9 % 100 mL IVPB  Status:  Discontinued        2 g 200 mL/hr over 30 Minutes Intravenous Every 8 hours 05/17/24 1953 05/18/24 2113   05/17/24 2130  ceFEPIme (MAXIPIME) 2 g in sodium chloride  0.9 % 100 mL IVPB  Status:  Discontinued        2 g 200 mL/hr over 30 Minutes Intravenous  Once 05/17/24 2043 05/17/24 2045   05/17/24 1615  cefTRIAXone (ROCEPHIN) 2 g in sodium chloride  0.9 % 100 mL IVPB        2 g 200 mL/hr over 30 Minutes Intravenous  Once 05/17/24 1602 05/17/24 1737              Family Communication/Anticipated D/C date and plan/Code Status   DVT prophylaxis: SCDs Start: 05/17/24 2043 apixaban (ELIQUIS) tablet 10 mg  apixaban (ELIQUIS) tablet 5 mg     Code Status: Full Code  Family Communication: None Disposition Plan: Plan to discharge home   Status is: Inpatient Remains inpatient appropriate because: Acute PE, sepsis from complicated UTI, acute UTI       Subjective:   Interval events noted.  She complains of lightheadedness when she got up to walk with PT.  She feels weak.  No shortness of breath or chest pain.  Objective:    Vitals:   05/22/24 2030 05/23/24 0033 05/23/24 0450 05/23/24 1239  BP: (!) 153/60 (!) 113/57 139/61 135/66  Pulse: 81 76 75 81  Resp: 16 14 15 16   Temp: 98.5 F (36.9 C) 98.8 F (37.1 C) 98.5 F (36.9 C) 99.2 F (37.3 C)  TempSrc: Oral Oral Oral Oral  SpO2:  94% 95% 98%  Weight:      Height:       No data found.   Intake/Output Summary (Last 24 hours) at 05/23/2024 1445 Last data filed at 05/23/2024 1300 Gross per 24 hour  Intake 480 ml  Output --  Net 480 ml   Filed Weights   05/17/24 1509  Weight: 72.6 kg    Exam:  GEN: NAD SKIN: Warm and dry EYES: No pallor oricterus ENT: MMM CV: RRR PULM: CTA B ABD: soft, ND, NT, +BS CNS: AAO x 3, non focal EXT: No edema or tenderness      Data Reviewed:   I have  personally reviewed following labs and imaging studies:  Labs: Labs show the following:   Basic Metabolic Panel: Recent Labs  Lab 05/17/24 1613 05/17/24 2136 05/18/24 0425 05/20/24 0531 05/21/24 0514 05/22/24 0447  NA 130*  --  134* 137 137 138  K 4.0  --  3.6 3.4* 3.5 3.7  CL 95*  --  101 101 101 100  CO2 24  --  27 28 28 28   GLUCOSE 154*  --  129* 107* 103* 105*  BUN 12  --  10 12 9 11   CREATININE 0.43*  --  0.44 0.51 0.47 0.30*  CALCIUM 9.0  --  8.4* 8.2* 8.1* 8.2*  MG  --  2.0  --   --   --   --   PHOS  --  3.3  --  2.8  --   --    GFR Estimated Creatinine Clearance: 72.2 mL/min (A) (by C-G formula based on SCr of 0.3 mg/dL (L)). Liver Function Tests: Recent Labs  Lab 05/17/24 1613 05/18/24 0425 05/20/24 0531  AST 16 12*  --   ALT 10 7  --   ALKPHOS 64 49  --   BILITOT 1.3* 0.9  --   PROT 7.5 5.9*  --   ALBUMIN 3.9 3.0* 2.7*   No results for input(s): LIPASE, AMYLASE in the last 168 hours. No results for input(s): AMMONIA in the last 168 hours. Coagulation profile Recent Labs  Lab 05/17/24 1613  INR 0.9    CBC: Recent Labs  Lab 05/17/24 1613 05/18/24 0425 05/19/24 0130 05/20/24 0531 05/21/24 0514 05/22/24 0447 05/23/24 0637  WBC 0.1*   < > 0.2* 0.4* 1.2* 2.7* 2.5*  NEUTROABS 0.0*  --   --   --   --   --  1.2*  HGB 12.5   < > 9.1* 9.2* 9.4* 8.8* 8.6*  HCT 35.6*   < > 26.7* 27.7* 27.9* 26.4* 26.5*  MCV 88.1   < > 89.9 91.4 91.2 91.3 94.0  PLT 58*   < > 26* 28* 42* 67* 101*   < > = values in this interval not displayed.   Cardiac Enzymes: No results for input(s): CKTOTAL, CKMB, CKMBINDEX, TROPONINI in the last 168 hours. BNP (last 3 results) No results for input(s): PROBNP in the last 8760 hours. CBG: No results for input(s): GLUCAP in the last 168 hours. D-Dimer: No results for input(s): DDIMER in the last 72 hours. Hgb A1c: No results for input(s): HGBA1C in the last 72 hours. Lipid Profile: No results for  input(s): CHOL, HDL, LDLCALC, TRIG, CHOLHDL, LDLDIRECT in the last 72 hours. Thyroid function studies: No results for input(s): TSH, T4TOTAL, T3FREE, THYROIDAB in the last 72 hours.  Invalid input(s): FREET3 Anemia work up: No results for input(s): VITAMINB12, FOLATE, FERRITIN, TIBC, IRON, RETICCTPCT in the last 72 hours. Sepsis Labs: Recent Labs  Lab 05/17/24 1613 05/17/24 1917 05/18/24 0425 05/20/24 0531 05/21/24 0514 05/22/24 0447 05/23/24 0637  WBC 0.1*  --    < > 0.4* 1.2* 2.7* 2.5*  LATICACIDVEN 1.7 0.9  --   --   --   --   --    < > = values in this interval not displayed.    Microbiology Recent Results (from the past 240 hours)  Urine Culture     Status: Abnormal   Collection Time: 05/17/24  3:58 PM   Specimen: Urine, Clean Catch  Result Value Ref Range Status   Specimen Description   Final    URINE, CLEAN CATCH Performed at [facility], [address] [place], [place] [address]    Special Requests   Final    NONE Reflexed from [id number] Performed at [facility], [address] [place], [place] [address]    Culture >=100,000 COLONIES/mL  ENTEROCOCCUS FAECALIS (A)  Final   Report Status 05/20/2024 FINAL  Final   Organism ID, Bacteria ENTEROCOCCUS FAECALIS (A)  Final      Susceptibility   Enterococcus faecalis - MIC*    AMPICILLIN <=2 SENSITIVE Sensitive     NITROFURANTOIN <=16 SENSITIVE Sensitive     VANCOMYCIN  1 SENSITIVE Sensitive     * >=100,000 COLONIES/mL ENTEROCOCCUS FAECALIS  Blood Culture (routine x 2)     Status: None   Collection Time: 05/17/24  4:13 PM   Specimen: BLOOD  Result Value Ref Range Status   Specimen Description BLOOD BLOOD RIGHT FOREARM  Final   Special Requests   Final    BOTTLES DRAWN AEROBIC AND ANAEROBIC Blood Culture results may not be optimal due to an inadequate volume of blood received in culture bottles   Culture   Final    NO GROWTH 5 DAYS Performed at [facility],  [address] [place], [place] [address]    Report Status 05/22/2024 FINAL  Final  Blood Culture (routine x 2)     Status: None   Collection Time: 05/17/24  4:13 PM   Specimen: BLOOD  Result Value Ref Range Status   Specimen Description BLOOD LEFT ANTECUBITAL  Final   Special Requests   Final    BOTTLES DRAWN AEROBIC AND ANAEROBIC Blood Culture results may not be optimal due to an inadequate volume of blood received in culture bottles   Culture   Final    NO GROWTH 5 DAYS Performed at [facility], [address] [place], [place] [address]    Report Status 05/22/2024 FINAL  Final  Resp panel by RT-PCR (RSV, Flu A&B, Covid) Anterior Nasal Swab     Status: None   Collection Time: 05/17/24  8:09 PM   Specimen: Anterior Nasal Swab  Result Value Ref Range Status   SARS Coronavirus 2 by RT PCR NEGATIVE NEGATIVE Final    Comment: (NOTE) SARS-CoV-2 target nucleic acids are NOT DETECTED.  The SARS-CoV-2 RNA is generally detectable in upper respiratory specimens during the acute phase of infection. The lowest concentration of SARS-CoV-2 viral copies this assay can detect is 138 copies/mL. A negative result does not preclude SARS-Cov-2 infection and should not be used as the sole basis for treatment or other patient management decisions. A negative result may occur with  improper specimen collection/handling, submission of specimen other than nasopharyngeal swab, presence of viral mutation(s) within the areas targeted by this assay, and inadequate number of viral copies(<138 copies/mL). A negative result must be combined with clinical observations, patient history, and epidemiological information. The expected result is Negative.  Fact Sheet for Patients:  bloggercourse.com  Fact Sheet for Healthcare Providers:  seriousbroker.it  This test is no t yet approved or cleared by the [place]  FDA and  has been authorized  for detection and/or diagnosis of SARS-CoV-2 by FDA under an Emergency Use Authorization (EUA). This EUA will remain  in effect (meaning this test can be used) for the duration of the COVID-19 declaration under Section 564(b)(1) of the Act, 21 U.S.C.section 360bbb-3(b)(1), unless the authorization is terminated  or revoked sooner.       Influenza A by PCR NEGATIVE NEGATIVE Final   Influenza B by PCR NEGATIVE NEGATIVE Final    Comment: (NOTE) The Xpert Xpress SARS-CoV-2/FLU/RSV plus assay is intended as an aid in the diagnosis of influenza from Nasopharyngeal swab specimens and should not be used as a sole basis for treatment. Nasal washings and  aspirates are unacceptable for Xpert Xpress SARS-CoV-2/FLU/RSV testing.  Fact Sheet for Patients: bloggercourse.com  Fact Sheet for Healthcare Providers: seriousbroker.it  This test is not yet approved or cleared by the [place]  FDA and has been authorized for detection and/or diagnosis of SARS-CoV-2 by FDA under an Emergency Use Authorization (EUA). This EUA will remain in effect (meaning this test can be used) for the duration of the COVID-19 declaration under Section 564(b)(1) of the Act, 21 U.S.C. section 360bbb-3(b)(1), unless the authorization is terminated or revoked.     Resp Syncytial Virus by PCR NEGATIVE NEGATIVE Final    Comment: (NOTE) Fact Sheet for Patients: bloggercourse.com  Fact Sheet for Healthcare Providers: seriousbroker.it  This test is not yet approved or cleared by the [place]  FDA and has been authorized for detection and/or diagnosis of SARS-CoV-2 by FDA under an Emergency Use Authorization (EUA). This EUA will remain in effect (meaning this test can be used) for the duration of the COVID-19 declaration under Section 564(b)(1) of the Act, 21 U.S.C. section 360bbb-3(b)(1), unless the authorization is  terminated or revoked.  Performed at [facility], [address] [place], [place] [address]     Procedures and diagnostic studies:  No results found.              LOS: 6 days   Sharetta Ricchio  [facility]   Pager on [email or web address]. If 7PM-7AM, please contact night-coverage at [email or web address]     05/23/2024, 2:45 PM

## 2024-05-23 NOTE — Telephone Encounter (Signed)
Neuro-onc referral placed.

## 2024-05-23 NOTE — Progress Notes (Signed)
 Mobility Specialist - Progress Note    05/23/24 1100  Mobility  Activity Ambulated with assistance;Stood at bedside  Level of Assistance Standby assist, set-up cues, supervision of patient - no hands on  Assistive Device Front wheel walker  Distance Ambulated (ft) 15 ft  Range of Motion/Exercises Active  Activity Response Tolerated well  Mobility Referral Yes  Mobility visit 1 Mobility  Mobility Specialist Start Time (ACUTE ONLY) 1056  Mobility Specialist Stop Time (ACUTE ONLY) 1107  Mobility Specialist Time Calculation (min) (ACUTE ONLY) 11 min   Pt resting in chair upon entry on RA. Pt STS and ambulates to bathroom SBA with RW. Pt returned to bed and left with needs in reach. RN notified and meds given.   Guido Rumble Mobility Specialist 05/23/24, 11:10 AM

## 2024-05-23 NOTE — Telephone Encounter (Signed)
-----   Message from Zelphia Cap sent at 05/22/2024  9:32 PM EST ----- She is about to be discharge in 1-2 days.  Please keep her same follow up appt with me on 11/19. Please refer her to neurology - neuropathy. Thanks.

## 2024-05-23 NOTE — Progress Notes (Addendum)
 Physical Therapy Treatment Patient Details Name: Stacey [name] MRN: [id number] DOB: 08/11/1960 Today's Date: 05/23/2024   History of Present Illness 63 y.o. female with a known history of diffuse large B-cell lymphoma on chemotherapy, last treatment last week followed by Dr. Babara, left ureteral stent, GERD presents to the emergency department for evaluation of fever and dysuria.  Patient was in a usual state of health until night prior with dysuria as well as suprapubic, L pelvis and bilateral low back pain.  Of note in September 2025 CT angiogram showed an  obstructing mass along the left pelvic sidewall causing hydroureter and hydronephrosis.    PT Comments  Pt in bed, ready for session.  HR 80's at rest.  She gets to EOB on her own then quickly stands to RW.  I stood up too fast  cues to stand and rest then she does need a short seated rest.  HR 130's.  After a short seated rest, HR returns to baseline and she stands more slowly this time and is able to walk around bed to recliner.  HR 140's and pt needing to sit due to fatigue.  After a short rest, she stands for standing ex and HR again 140's with fatigue after limited activity.    Pt continues to be limited by HR and general feeling of fatigue/weakness.  Husband in room.  Stated insurance will not pay for DME and he planned to get it after she gets home.  Encouraged him to secure DME prior to discharge as she will likely need wheelchair to access home mobility.  He leaves to check local stores at beginning of session.  Recommended wheelchair and BSC.  Stated she has walker.  Offered gait belt but she did not want it.  She continues to show some decreased safety awareness in regards to physical limitations and safe mobility/activity.  She often overestimates what is safe which does put her at risk for inc falls from dizziness/weakness.  She has been educated at length.  Discussed with MD HR response to activity.  Stated she felt good with stairs  and did not want to review today.   If plan is discharge home, recommend the following: A little help with walking and/or transfers;A little help with bathing/dressing/bathroom;Assistance with cooking/housework;Assist for transportation;Help with stairs or ramp for entrance   Can travel by private vehicle        Equipment Recommendations  Rolling walker (2 wheels);BSC/3in1;Wheelchair (measurements PT)    Recommendations for Other Services       Precautions / Restrictions Precautions Precautions: Fall Recall of Precautions/Restrictions: Intact Precaution/Restrictions Comments: HR elevated Restrictions Weight Bearing Restrictions Per Provider Order: No     Mobility  Bed Mobility Overal bed mobility: Modified Independent               Patient Response: Cooperative  Transfers Overall transfer level: Needs assistance Equipment used: Rolling walker (2 wheels) Transfers: Sit to/from Stand Sit to Stand: Contact guard assist                Ambulation/Gait Ambulation/Gait assistance: Contact guard assist Gait Distance (Feet): 10 Feet Assistive device: Rolling walker (2 wheels) Gait Pattern/deviations: Step-to pattern, Decreased step length - left Gait velocity: dec     General Gait Details: limited by HR   Stairs             Wheelchair Mobility     Tilt Bed Tilt Bed Patient Response: Cooperative  Modified Rankin (Stroke Patients Only)  Balance Overall balance assessment: Needs assistance Sitting-balance support: Feet supported Sitting balance-Leahy Scale: Good     Standing balance support: Bilateral upper extremity supported Standing balance-Leahy Scale: Fair                              Hotel Manager: No apparent difficulties  Cognition Arousal: Alert Behavior During Therapy: WFL for tasks assessed/performed   PT - Cognitive impairments: Safety/Judgement                          Following commands: Intact      Cueing Cueing Techniques: Verbal cues  Exercises Other Exercises Other Exercises: standing ex with RW    General Comments        Pertinent Vitals/Pain Pain Assessment Pain Assessment: Faces Faces Pain Scale: Hurts little more Pain Location: LLE - but generally improved Pain Descriptors / Indicators: Aching, Discomfort Pain Intervention(s): Limited activity within patient's tolerance, Monitored during session, Premedicated before session, Repositioned    Home Living                          Prior Function            PT Goals (current goals can now be found in the care plan section) Progress towards PT goals: Progressing toward goals    Frequency    Min 1X/week      PT Plan      Co-evaluation              AM-PAC PT 6 Clicks Mobility   Outcome Measure  Help needed turning from your back to your side while in a flat bed without using bedrails?: None Help needed moving from lying on your back to sitting on the side of a flat bed without using bedrails?: None Help needed moving to and from a bed to a chair (including a wheelchair)?: A Little Help needed standing up from a chair using your arms (e.g., wheelchair or bedside chair)?: A Little Help needed to walk in hospital room?: A Little Help needed climbing 3-5 steps with a railing? : A Little 6 Click Score: 20    End of Session Equipment Utilized During Treatment: Gait belt Activity Tolerance: Patient limited by fatigue;Treatment limited secondary to medical complications (Comment) Patient left: in chair;with call bell/phone within reach Nurse Communication: Mobility status PT Visit Diagnosis: Muscle weakness (generalized) (M62.81);Difficulty in walking, not elsewhere classified (R26.2);Pain Pain - Right/Left: Left Pain - part of body: Hip     Time: 1015-1027 PT Time Calculation (min) (ACUTE ONLY): 12 min  Charges:    $Gait Training: 8-22 mins PT  General Charges $$ ACUTE PT VISIT: 1 Visit                   Lauraine Gills, PTA 05/23/24, 12:33 PM

## 2024-05-23 NOTE — Plan of Care (Signed)
  Problem: Clinical Measurements: Goal: Will remain free from infection Outcome: Progressing Goal: Diagnostic test results will improve Outcome: Progressing Goal: Cardiovascular complication will be avoided Outcome: Progressing   Problem: Activity: Goal: Risk for activity intolerance will decrease Outcome: Progressing   Problem: Safety: Goal: Ability to remain free from injury will improve Outcome: Progressing

## 2024-05-23 NOTE — Plan of Care (Signed)

## 2024-05-24 ENCOUNTER — Other Ambulatory Visit: Payer: Self-pay

## 2024-05-24 ENCOUNTER — Encounter: Payer: Self-pay | Admitting: Oncology

## 2024-05-24 DIAGNOSIS — N390 Urinary tract infection, site not specified: Secondary | ICD-10-CM | POA: Diagnosis not present

## 2024-05-24 DIAGNOSIS — D709 Neutropenia, unspecified: Secondary | ICD-10-CM | POA: Diagnosis not present

## 2024-05-24 DIAGNOSIS — R5081 Fever presenting with conditions classified elsewhere: Secondary | ICD-10-CM | POA: Diagnosis not present

## 2024-05-24 LAB — CBC
HCT: 26.1 % — ABNORMAL LOW (ref 36.0–46.0)
Hemoglobin: 8.5 g/dL — ABNORMAL LOW (ref 12.0–15.0)
MCH: 30.5 pg (ref 26.0–34.0)
MCHC: 32.6 g/dL (ref 30.0–36.0)
MCV: 93.5 fL (ref 80.0–100.0)
Platelets: 119 K/uL — ABNORMAL LOW (ref 150–400)
RBC: 2.79 MIL/uL — ABNORMAL LOW (ref 3.87–5.11)
RDW: 12.6 % (ref 11.5–15.5)
WBC: 2.9 K/uL — ABNORMAL LOW (ref 4.0–10.5)
nRBC: 0.7 % — ABNORMAL HIGH (ref 0.0–0.2)

## 2024-05-24 MED ORDER — APIXABAN 5 MG PO TABS
ORAL_TABLET | ORAL | 2 refills | Status: AC
  Filled 2024-05-24: qty 60, 27d supply, fill #0
  Filled 2024-06-21: qty 60, 30d supply, fill #1
  Filled 2024-07-28: qty 60, 30d supply, fill #2

## 2024-05-24 MED ORDER — AMOXICILLIN 500 MG PO CAPS
1000.0000 mg | ORAL_CAPSULE | Freq: Three times a day (TID) | ORAL | 0 refills | Status: DC
  Filled 2024-05-24: qty 18, 3d supply, fill #0

## 2024-05-24 NOTE — Discharge Instructions (Signed)
 Please make sure to stay hydrated.   Please take your time when going from the seated to standing position. Please check your blood pressure and heart rate if you develop lightheadedness or palpitations and call your primary care doctor.

## 2024-05-24 NOTE — TOC Transition Note (Signed)
 Transition of Care [facility]) - Discharge Note   Patient Details  Name: Stacey [name] MRN: [id number] Date of Birth: Oct 21, 1960  Transition of [facility] CM/SW Contact:  Daved JONETTA Hamilton, RN Phone Number: 05/24/2024, 3:04 PM   Clinical Narrative:     Patient will DC to: Home Anticipated DC date: 05/24/2024 Family notified: [facility] Transport by: [facility] personal vehicle  Per MD patient ready for DC to home. RN, patient, and patient's family notified of DC.    TOC signing off.   Final next level of care: Home/Self Care Barriers to Discharge: Barriers Resolved   Patient Goals and CMS Choice            Discharge Placement                  Name of family member notified: Sherrilynn Gudgel Patient and family notified of of transfer: 05/24/24  Discharge Plan and Services Additional resources added to the After Visit Summary for     Discharge Planning Services: CM Consult                                 Social Drivers of Health (SDOH) Interventions SDOH Screenings   Food Insecurity: No Food Insecurity (05/17/2024)  Housing: Low Risk  (05/17/2024)  Transportation Needs: No Transportation Needs (05/17/2024)  Utilities: Not At Risk (05/17/2024)  Depression (PHQ2-9): Low Risk  (05/12/2024)  Financial Resource Strain: Low Risk  (03/30/2024)   Received from [facility]  Social Connections: Socially Integrated (05/17/2024)  Tobacco Use: Low Risk  (05/17/2024)     Readmission Risk Interventions     No data to display

## 2024-05-24 NOTE — Plan of Care (Signed)
  Problem: Health Behavior/Discharge Planning: Goal: Ability to manage health-related needs will improve Outcome: Progressing   Problem: Clinical Measurements: Goal: Will remain free from infection Outcome: Progressing   Problem: Coping: Goal: Level of anxiety will decrease Outcome: Progressing   Problem: Elimination: Goal: Will not experience complications related to urinary retention Outcome: Progressing

## 2024-05-25 ENCOUNTER — Other Ambulatory Visit: Payer: Self-pay

## 2024-05-25 MED ORDER — OXYCODONE HCL 5 MG PO TABS: 5.0000 mg | ORAL_TABLET | ORAL | 0 refills | Status: DC | PRN

## 2024-05-26 ENCOUNTER — Other Ambulatory Visit: Payer: Self-pay | Admitting: Oncology

## 2024-05-26 ENCOUNTER — Telehealth: Payer: Self-pay

## 2024-05-26 MED ORDER — OXYCODONE HCL 5 MG PO TABS: 5.0000 mg | ORAL_TABLET | ORAL | 0 refills | Status: DC | PRN

## 2024-05-26 NOTE — Telephone Encounter (Signed)
 Patient called stating pharmacy told her PA for quantity needed on Oxycodone  5mg  rx that was sent yesterday.  Needs ASAP she did not have pain meds for last night and out today.

## 2024-05-26 NOTE — Telephone Encounter (Signed)
 Ash, can you add appt with Josh B (for pain management) on a day that pt is here next week.

## 2024-05-26 NOTE — Discharge Summary (Signed)
 Physician Discharge Summary   Patient: Stacey [name] MRN: [id number] DOB: 11-24-1960  Admit date:     05/17/2024  Discharge date: 05/24/2024  Discharge Physician: Alban Pepper   PCP: [name], PA-C   Recommendations at discharge:    Tachycardia with standing, will need to be followed up with PCP  Discharge Diagnoses: Principal Problem:   Neutropenic fever Active Problems:   Sepsis (HCC)   Pulmonary embolism (HCC)   Urinary tract infection without hematuria   Neutropenia   Thrombocytopenia  Resolved Problems:   * No resolved hospital problems. *  Hospital Course: Stacey [name] is a 63 y.o. female with medical history significant for diffuse large B-cell lymphoma on chemotherapy followed by Dr. Babara, left ureteral stent, GERD, who presented to the ED because of lower abdominal pain, bilateral low back pain, fever and dysuria.   Of note in September 2025 CT angiogram showed an obstructing mass along the left pelvic sidewall causing hydroureter and hydronephrosis. At that time stent placed by urologist    She was admitted to the hospital for neutropenic fever, sepsis secondary to acute complicated UTI.  Assessment and Plan: Sepsis secondary to acute complicated UTI  Neutropenic fever Resovled  Pt w/ L ureteric stent for hydronephrosis 2/2 to L pelvic mass. Stent placed 04/08/24. Ucx growing enterococcus faecalis. Patient treated with unasyn inpatient and discharged on amoxicillin 1g TID until 11/15 to complete a total 10d course of antibiotics.    L pelvic mass cb L hydronephrosis s/p L ureteral stent Follows outpatient with urology. continued on flomax, oxybutynin, and pyridium.    Acute R LL subsegmental PE In the setting of Lymphoma Continued on eliquis  Diffuse LBCL Recent R CHOP.  F/u with oncology   LLE edema No evidence of DVT.   Postural tachycardia  Improved. Patient was not orthostatic. Her heart did rise with standing on the day of  discharge but her symptoms were minimal and her heart rate quickly recovered to normal. Discussed with patient she should ensure she is well hydrated. She continue to monitor this outpatient and will let her PCP know.   Pancytopenia:  IN the setting of chemotherapy  Leukopenia and plt improved. Hgb stable.     Latest Ref Rng & Units 05/24/2024    4:20 AM 05/23/2024    6:37 AM 05/22/2024    4:47 AM  CBC  WBC 4.0 - 10.5 K/uL 2.9  2.5  2.7   Hemoglobin 12.0 - 15.0 g/dL 8.5  8.6  8.8   Hematocrit 36.0 - 46.0 % 26.1  26.5  26.4   Platelets 150 - 400 K/uL 119  101  67   Further treatment per hemonc.    Comorbidities include gout, GERD, osteoarthritis, chronic pain       Consultants: ID oncology urology  Procedures performed: NOne  Disposition: Home Diet recommendation:  Discharge Diet Orders (From admission, onward)     Start     Ordered   05/24/24 0000  Diet general        05/24/24 1412           Regular diet DISCHARGE MEDICATION: Allergies as of 05/24/2024       Reactions   Propofol  Nausea Only   Patient states medication can be tolerated with an antiemetic before taking        Medication List     STOP taking these medications    acetaminophen  500 MG tablet Commonly known as: TYLENOL        TAKE  these medications    acyclovir 400 MG tablet Commonly known as: ZOVIRAX Take 1 tablet (400 mg total) by mouth daily.   allopurinol 300 MG tablet Commonly known as: ZYLOPRIM Take 1 tablet (300 mg total) by mouth daily.   amoxicillin 500 MG capsule Commonly known as: AMOXIL Take 2 capsules (1,000 mg total) by mouth 3 (three) times daily for 3 days.   Eliquis 5 MG Tabs tablet Generic drug: apixaban Take 2 tablets (10 mg total) by mouth 2 (two) times daily for 3 days, THEN 1 tablet (5 mg total) 2 (two) times daily. Start taking on: May 24, 2024   fentaNYL  25 MCG/HR Commonly known as: DURAGESIC  Place 1 patch onto the skin every 3 (three) days.    gabapentin  100 MG capsule Commonly known as: NEURONTIN  Take 1 capsule (100 mg total) by mouth 3 (three) times daily.   lidocaine -prilocaine cream Commonly known as: EMLA Apply to affected area once   loratadine 10 MG tablet Commonly known as: CLARITIN Take 10 mg by mouth at bedtime.   magic mouthwash (multi-ingredient) oral suspension Swish and swallow 5-10 mLs 4 (four) times daily as needed for mouth pain.   methocarbamol  500 MG tablet Commonly known as: ROBAXIN  Take 500 mg by mouth every 8 (eight) hours as needed for muscle spasms.   omeprazole  20 MG capsule Commonly known as: PRILOSEC TAKE 1 CAPSULE (20 MG TOTAL) BY MOUTH 2 (TWO) TIMES DAILY BEFORE A MEAL.   ondansetron  8 MG tablet Commonly known as: Zofran  Take 1 tablet (8 mg total) by mouth every 8 (eight) hours as needed for nausea or vomiting. Start on the third day after cyclophosphamide chemotherapy.   oxybutynin 10 MG 24 hr tablet Commonly known as: DITROPAN-XL Take 1 tablet (10 mg total) by mouth daily.   predniSONE  20 MG tablet Commonly known as: DELTASONE  Take 5 tablets (100 mg total) by mouth daily. Take with food on days 2-5 of chemotherapy.   prochlorperazine 10 MG tablet Commonly known as: COMPAZINE Take 1 tablet (10 mg total) by mouth every 6 (six) hours as needed for nausea or vomiting.   sodium phosphate  Enem Place 133 mLs (1 enema total) rectally daily as needed for severe constipation.   tamsulosin 0.4 MG Caps capsule Commonly known as: FLOMAX Take 1 capsule (0.4 mg total) by mouth daily.               Durable Medical Equipment  (From admission, onward)           Start     Ordered   05/19/24 0000  For home use only DME Walker rolling       Question Answer Comment  Walker: With 5 Inch Wheels   Patient needs a walker to treat with the following condition General weakness      05/19/24 1940            Follow-up Information     [name], PA-C Follow up.    Specialty: Physician Assistant Why: hospital follow up Contact information: [address] [place] [address] [phone]                Discharge Exam: Fredricka Weights   05/17/24 1509  Weight: 72.6 kg   Physical Exam  Constitutional: In no distress.  Cardiovascular: Normal rate, regular rhythm. No lower extremity edema  Pulmonary: Non labored breathing on room air, no wheezing or rales.   Abdominal: Soft. Non distended and non tender Musculoskeletal: Normal range of motion.  Neurological: Alert and oriented to person, place, and time. Non focal  Skin: Skin is warm and dry.    Condition at discharge: good  The results of significant diagnostics from this hospitalization (including imaging, microbiology, ancillary and laboratory) are listed below for reference.   Imaging Studies: US  Venous Img Lower Bilateral (DVT) Result Date: 05/19/2024 CLINICAL DATA:  Lower extremity pain. EXAM: BILATERAL LOWER EXTREMITY VENOUS DOPPLER ULTRASOUND TECHNIQUE: Gray-scale sonography with graded compression, as well as color Doppler and duplex ultrasound were performed to evaluate the lower extremity deep venous systems from the level of the common femoral vein and including the common femoral, femoral, profunda femoral, popliteal and calf veins including the posterior tibial, peroneal and gastrocnemius veins when visible. The superficial great saphenous vein was also interrogated. Spectral Doppler was utilized to evaluate flow at rest and with distal augmentation maneuvers in the common femoral, femoral and popliteal veins. COMPARISON:  None Available. FINDINGS: RIGHT LOWER EXTREMITY Common Femoral Vein: No evidence of thrombus. Normal compressibility, respiratory phasicity and response to augmentation. Saphenofemoral Junction: No evidence of thrombus. Normal compressibility and flow on color Doppler imaging. Profunda Femoral Vein: No evidence of thrombus. Normal compressibility and  flow on color Doppler imaging. Femoral Vein: No evidence of thrombus. Normal compressibility, respiratory phasicity and response to augmentation. Popliteal Vein: No evidence of thrombus. Normal compressibility, respiratory phasicity and response to augmentation. Calf Veins: No evidence of thrombus. Normal compressibility and flow on color Doppler imaging. Superficial Great Saphenous Vein: No evidence of thrombus. Normal compressibility. Venous Reflux:  None. Other Findings: No evidence of superficial thrombophlebitis or abnormal fluid collection. LEFT LOWER EXTREMITY Common Femoral Vein: No evidence of thrombus. Normal compressibility, respiratory phasicity and response to augmentation. Saphenofemoral Junction: No evidence of thrombus. Normal compressibility and flow on color Doppler imaging. Profunda Femoral Vein: No evidence of thrombus. Normal compressibility and flow on color Doppler imaging. Femoral Vein: No evidence of thrombus. Normal compressibility, respiratory phasicity and response to augmentation. Popliteal Vein: No evidence of thrombus. Normal compressibility, respiratory phasicity and response to augmentation. Calf Veins: No evidence of thrombus. Normal compressibility and flow on color Doppler imaging. Superficial Great Saphenous Vein: No evidence of thrombus. Normal compressibility. Venous Reflux:  None. Other Findings: No evidence of superficial thrombophlebitis or abnormal fluid collection. IMPRESSION: No evidence of deep venous thrombosis in either lower extremity. Electronically Signed   By: Marcey Moan M.D.   On: 05/19/2024 17:20   CT Angio Chest Pulmonary Embolism (PE) W or WO Contrast Result Date: 05/18/2024 CLINICAL DATA:  Concern for pulmonary embolism.  Lymphoma. EXAM: CT ANGIOGRAPHY CHEST WITH CONTRAST TECHNIQUE: Multidetector CT imaging of the chest was performed using the standard protocol during bolus administration of intravenous contrast. Multiplanar CT image reconstructions  and MIPs were obtained to evaluate the vascular anatomy. RADIATION DOSE REDUCTION: This exam was performed according to the departmental dose-optimization program which includes automated exposure control, adjustment of the mA and/or kV according to patient size and/or use of iterative reconstruction technique. CONTRAST:  50mL OMNIPAQUE IOHEXOL 350 MG/ML SOLN COMPARISON:  Chest radiograph dated 05/17/2024. FINDINGS: Cardiovascular: There is no cardiomegaly or pericardial effusion. The thoracic aorta is unremarkable. The origins of the great vessels of the aortic arch are patent. Right-sided Port-A-Cath with tip in the right atrium at the cavoatrial junction. Linear nonocclusive right lower lobe subsegmental pulmonary artery embolus, age indeterminate. No CT evidence of right heart straining. Mediastinum/Nodes: No hilar or mediastinal adenopathy. The esophagus and the thyroid gland are grossly unremarkable no mediastinal fluid collection.  Lungs/Pleura: Minimal bibasilar linear atelectasis. No focal consolidation, pleural effusion, or pneumothorax. The central airways are patent. Upper Abdomen: No acute abnormality. Musculoskeletal: Degenerative changes of spine. No acute osseous pathology. Bilateral shoulder arthroplasties. Review of the MIP images confirms the above findings. IMPRESSION: 1. Age indeterminate linear nonocclusive right lower lobe subsegmental pulmonary artery embolus. No CT evidence of right heart straining. 2. No focal consolidation. These results will be called to the ordering clinician or representative by the Radiologist Assistant, and communication documented in the PACS or [employer]. Electronically Signed   By: Vanetta Chou M.D.   On: 05/18/2024 14:56   CT ABDOMEN PELVIS W CONTRAST Result Date: 05/17/2024 CLINICAL DATA:  Abdominal pain.  Concern for kidney stone. EXAM: CT ABDOMEN AND PELVIS WITH CONTRAST TECHNIQUE: Multidetector CT imaging of the abdomen and pelvis was performed  using the standard protocol following bolus administration of intravenous contrast. RADIATION DOSE REDUCTION: This exam was performed according to the departmental dose-optimization program which includes automated exposure control, adjustment of the mA and/or kV according to patient size and/or use of iterative reconstruction technique. CONTRAST:  100mL OMNIPAQUE IOHEXOL 300 MG/ML  SOLN COMPARISON:  CT abdomen pelvis dated 04/08/2024. FINDINGS: Lower chest: The visualized lung bases are clear. Tiny nonocclusive embolus in the right lower lobe subsegmental branch (2/2). Chest CT may provide better evaluation. No intra-abdominal free air or free fluid. Hepatobiliary: The liver is unremarkable. No biliary dilatation. The gallbladder is unremarkable. Pancreas: Unremarkable. No pancreatic ductal dilatation or surrounding inflammatory changes. Spleen: Normal in size without focal abnormality. Adrenals/Urinary Tract: The adrenal glands unremarkable. Left ureteral stent with proximal tip in the inferior pole collecting system and distal end in the urinary bladder. There is mild left hydronephrosis. There is urothelial enhancement of the left renal collecting system and ureter suggestive of ascending UTI. No stone identified. The right kidney, right ureter, and urinary bladder appear unremarkable. Stomach/Bowel: Moderate stool throughout the colon. There is no bowel obstruction or active inflammation. Appendectomy. Vascular/Lymphatic: The abdominal aorta and IVC unremarkable. No portal venous gas. There is no adenopathy. Reproductive: The uterus is grossly unremarkable. Other: Interval decrease in the masslike soft tissues thickening of the left pelvic sidewall measuring approximately 5.8 x 3.3 cm in greatest axial dimensions (previously 6.7 x 4.8 cm). A 1.4 x 1.2 cm rim enhancing low attenuating focus within this soft tissue mass (coronal 52/5) suspicious for an area of phlegmonous change or developing abscess.  Musculoskeletal: Degenerative changes of the spine. No acute osseous pathology. IMPRESSION: 1. Left ureteral stent with mild left hydronephrosis and findings suggestive of ascending UTI. Correlation with urinalysis recommended. 2. Interval decrease in the masslike soft tissues thickening of the left pelvic sidewall. A 1.4 x 1.2 cm rim enhancing low attenuating focus within this soft tissue mass suspicious for an area of phlegmonous change or developing abscess. 3. Tiny nonocclusive embolus in the right lower lobe subsegmental branch. Chest CT may provide better evaluation. These results were called by telephone at the time of interpretation on 05/17/2024 at 6:28 pm to provider LAMAR PRICE , who verbally acknowledged these results. Electronically Signed   By: Vanetta Chou M.D.   On: 05/17/2024 18:39   DG Chest Port 1 View Result Date: 05/17/2024 CLINICAL DATA:  Sepsis. EXAM: PORTABLE CHEST 1 VIEW COMPARISON:  Chest radiograph dated 09/17/2012 FINDINGS: Right-sided PICC with tip over central SVC. No focal consolidation, pleural effusion, pneumothorax. The cardiac silhouette is within limits. No acute osseous pathology. Bilateral shoulder arthroplasties. IMPRESSION: No active disease. Electronically  Signed   By: Arash  Radparvar M.D.   On: 05/17/2024 16:38   IR IMAGING GUIDED PORT INSERTION Result Date: 05/01/2024 CLINICAL DATA:  Large B-cell lymphoma of the pelvis and need for porta cath to begin chemotherapy. EXAM: IMPLANTED PORT A CATH PLACEMENT WITH ULTRASOUND AND FLUOROSCOPIC GUIDANCE ANESTHESIA/SEDATION: Moderate (conscious) sedation was employed during this procedure. A total of Versed  1.0 mg and Fentanyl  50 mcg was administered intravenously. Moderate Sedation Time: 28 minutes. The patient's level of consciousness and vital signs were monitored continuously by radiology nursing throughout the procedure under my direct supervision. FLUOROSCOPY: Radiation Exposure Index: 1.9 mGy Kerma PROCEDURE: The  procedure, risks, benefits, and alternatives were explained to the patient. Questions regarding the procedure were encouraged and answered. The patient understands and consents to the procedure. A time-out was performed prior to initiating the procedure. Ultrasound was utilized to confirm patency of the right internal jugular vein. An ultrasound image was saved and recorded. The right neck and chest were prepped with chlorhexidine  in a sterile fashion, and a sterile drape was applied covering the operative field. Maximum barrier sterile technique with sterile gowns and gloves were used for the procedure. Local anesthesia was provided with 1% lidocaine . After creating a small venotomy incision, a 21 gauge needle was advanced into the right internal jugular vein under direct, real-time ultrasound guidance. Ultrasound image documentation was performed. After securing guidewire access, an 8 Fr dilator was placed. A J-wire was kinked to measure appropriate catheter length. A subcutaneous port pocket was then created along the upper chest wall utilizing sharp and blunt dissection. Portable cautery was utilized. The pocket was irrigated with sterile saline. A single lumen power injectable port was chosen for placement. The 8 Fr catheter was tunneled from the port pocket site to the venotomy incision. The port was placed in the pocket. External catheter was trimmed to appropriate length based on guidewire measurement. At the venotomy, an 8 Fr peel-away sheath was placed over a guidewire. The catheter was then placed through the sheath and the sheath removed. Final catheter positioning was confirmed and documented with a fluoroscopic spot image. The port was accessed with a needle and aspirated and flushed with heparinized saline. The access needle was removed. The venotomy and port pocket incisions were closed with subcutaneous 3-0 Monocryl and subcuticular 4-0 Vicryl. Dermabond was applied to both incisions. COMPLICATIONS:  COMPLICATIONS None FINDINGS: After catheter placement, the tip lies at the cavo-atrial junction. The catheter aspirates normally and is ready for immediate use. IMPRESSION: Placement of single lumen port a cath via right internal jugular vein. The catheter tip lies at the cavo-atrial junction. A power injectable port a cath was placed and is ready for immediate use. Electronically Signed   By: Marcey Moan M.D.   On: 05/01/2024 11:53   CT BONE MARROW BIOPSY & ASPIRATION Result Date: 05/01/2024 CLINICAL DATA:  Large B-cell lymphoma of pelvic sidewall and need for bone marrow biopsy. EXAM: CT GUIDED BONE MARROW ASPIRATION AND BIOPSY ANESTHESIA/SEDATION: Moderate (conscious) sedation was employed during this procedure. A total of Versed  2.0 mg and Fentanyl  100 mcg was administered intravenously. Moderate Sedation Time: 10 minutes. The patient's level of consciousness and vital signs were monitored continuously by radiology nursing throughout the procedure under my direct supervision. PROCEDURE: The procedure risks, benefits, and alternatives were explained to the patient. Questions regarding the procedure were encouraged and answered. The patient understands and consents to the procedure. A time out was performed prior to initiating the procedure. The right gluteal  region was prepped with chlorhexidine . Sterile gown and sterile gloves were used for the procedure. Local anesthesia was provided with 1% Lidocaine . Under CT guidance, an 11 gauge On Control bone cutting needle was advanced from a posterior approach into the right iliac bone. Needle positioning was confirmed with CT. Initial non heparinized and heparinized aspirate samples were obtained of bone marrow. Core biopsy was performed via the On Control drill needle. COMPLICATIONS: None FINDINGS: Inspection of initial aspirate did reveal visible particles. Intact core biopsy sample was obtained. IMPRESSION: CT guided bone marrow biopsy of right posterior  iliac bone with both aspirate and core samples obtained. Electronically Signed   By: Marcey Moan M.D.   On: 05/01/2024 10:41    Microbiology: Results for orders placed or performed during the hospital encounter of 05/17/24  Urine Culture     Status: Abnormal   Collection Time: 05/17/24  3:58 PM   Specimen: Urine, Clean Catch  Result Value Ref Range Status   Specimen Description   Final    URINE, CLEAN CATCH Performed at [facility], [address] [place], [place] [address]    Special Requests   Final    NONE Reflexed from [id number] Performed at [facility], [address] [place], [place] [address]    Culture >=100,000 COLONIES/mL ENTEROCOCCUS FAECALIS (A)  Final   Report Status 05/20/2024 FINAL  Final   Organism ID, Bacteria ENTEROCOCCUS FAECALIS (A)  Final      Susceptibility   Enterococcus faecalis - MIC*    AMPICILLIN <=2 SENSITIVE Sensitive     NITROFURANTOIN <=16 SENSITIVE Sensitive     VANCOMYCIN  1 SENSITIVE Sensitive     * >=100,000 COLONIES/mL ENTEROCOCCUS FAECALIS  Blood Culture (routine x 2)     Status: None   Collection Time: 05/17/24  4:13 PM   Specimen: BLOOD  Result Value Ref Range Status   Specimen Description BLOOD BLOOD RIGHT FOREARM  Final   Special Requests   Final    BOTTLES DRAWN AEROBIC AND ANAEROBIC Blood Culture results may not be optimal due to an inadequate volume of blood received in culture bottles   Culture   Final    NO GROWTH 5 DAYS Performed at [facility], [address] [place], [place] [address]    Report Status 05/22/2024 FINAL  Final  Blood Culture (routine x 2)     Status: None   Collection Time: 05/17/24  4:13 PM   Specimen: BLOOD  Result Value Ref Range Status   Specimen Description BLOOD LEFT ANTECUBITAL  Final   Special Requests   Final    BOTTLES DRAWN AEROBIC AND ANAEROBIC Blood Culture results may not be optimal due to an inadequate volume of blood received in culture bottles   Culture   Final     NO GROWTH 5 DAYS Performed at [facility], [address] [place], [place] [address]    Report Status 05/22/2024 FINAL  Final  Resp panel by RT-PCR (RSV, Flu A&B, Covid) Anterior Nasal Swab     Status: None   Collection Time: 05/17/24  8:09 PM   Specimen: Anterior Nasal Swab  Result Value Ref Range Status   SARS Coronavirus 2 by RT PCR NEGATIVE NEGATIVE Final    Comment: (NOTE) SARS-CoV-2 target nucleic acids are NOT DETECTED.  The SARS-CoV-2 RNA is generally detectable in upper respiratory specimens during the acute phase of infection. The lowest concentration of SARS-CoV-2 viral copies this assay can detect is 138 copies/mL. A  negative result does not preclude SARS-Cov-2 infection and should not be used as the sole basis for treatment or other patient management decisions. A negative result may occur with  improper specimen collection/handling, submission of specimen other than nasopharyngeal swab, presence of viral mutation(s) within the areas targeted by this assay, and inadequate number of viral copies(<138 copies/mL). A negative result must be combined with clinical observations, patient history, and epidemiological information. The expected result is Negative.  Fact Sheet for Patients:  bloggercourse.com  Fact Sheet for Healthcare Providers:  seriousbroker.it  This test is no t yet approved or cleared by the [place]  FDA and  has been authorized for detection and/or diagnosis of SARS-CoV-2 by FDA under an Emergency Use Authorization (EUA). This EUA will remain  in effect (meaning this test can be used) for the duration of the COVID-19 declaration under Section 564(b)(1) of the Act, 21 U.S.C.section 360bbb-3(b)(1), unless the authorization is terminated  or revoked sooner.       Influenza A by PCR NEGATIVE NEGATIVE Final   Influenza B by PCR NEGATIVE NEGATIVE Final    Comment: (NOTE) The Xpert  Xpress SARS-CoV-2/FLU/RSV plus assay is intended as an aid in the diagnosis of influenza from Nasopharyngeal swab specimens and should not be used as a sole basis for treatment. Nasal washings and aspirates are unacceptable for Xpert Xpress SARS-CoV-2/FLU/RSV testing.  Fact Sheet for Patients: bloggercourse.com  Fact Sheet for Healthcare Providers: seriousbroker.it  This test is not yet approved or cleared by the [place]  FDA and has been authorized for detection and/or diagnosis of SARS-CoV-2 by FDA under an Emergency Use Authorization (EUA). This EUA will remain in effect (meaning this test can be used) for the duration of the COVID-19 declaration under Section 564(b)(1) of the Act, 21 U.S.C. section 360bbb-3(b)(1), unless the authorization is terminated or revoked.     Resp Syncytial Virus by PCR NEGATIVE NEGATIVE Final    Comment: (NOTE) Fact Sheet for Patients: bloggercourse.com  Fact Sheet for Healthcare Providers: seriousbroker.it  This test is not yet approved or cleared by the [place]  FDA and has been authorized for detection and/or diagnosis of SARS-CoV-2 by FDA under an Emergency Use Authorization (EUA). This EUA will remain in effect (meaning this test can be used) for the duration of the COVID-19 declaration under Section 564(b)(1) of the Act, 21 U.S.C. section 360bbb-3(b)(1), unless the authorization is terminated or revoked.  Performed at [facility], [address] [place], [place] [address]     Labs: CBC: Recent Labs  Lab 05/20/24 [id number] 05/21/24 0514 05/22/24 0447 05/23/24 0637 05/24/24 0420  WBC 0.4* 1.2* 2.7* 2.5* 2.9*  NEUTROABS  --   --   --  1.2*  --   HGB 9.2* 9.4* 8.8* 8.6* 8.5*  HCT 27.7* 27.9* 26.4* 26.5* 26.1*  MCV 91.4 91.2 91.3 94.0 93.5  PLT 28* 42* 67* 101* 119*   Basic Metabolic Panel: Recent Labs  Lab  05/20/24 0531 05/21/24 0514 05/22/24 0447  NA 137 137 138  K 3.4* 3.5 3.7  CL 101 101 100  CO2 28 28 28   GLUCOSE 107* 103* 105*  BUN 12 9 11   CREATININE 0.51 0.47 0.30*  CALCIUM 8.2* 8.1* 8.2*  PHOS 2.8  --   --    Liver Function Tests: Recent Labs  Lab 05/20/24 0531  ALBUMIN 2.7*   CBG: No results for input(s): GLUCAP in the last 168 hours.  Discharge time spent: greater than 30 minutes.  Signed: Alban Pepper, MD  [facility] 05/26/2024

## 2024-05-27 ENCOUNTER — Other Ambulatory Visit: Payer: Self-pay

## 2024-05-28 ENCOUNTER — Other Ambulatory Visit: Payer: Self-pay | Admitting: Urology

## 2024-05-28 DIAGNOSIS — Z960 Presence of urogenital implants: Secondary | ICD-10-CM

## 2024-05-30 MED FILL — Fosaprepitant Dimeglumine For IV Infusion 150 MG (Base Eq): INTRAVENOUS | Qty: 5 | Status: AC

## 2024-05-31 ENCOUNTER — Encounter: Payer: Self-pay | Admitting: Oncology

## 2024-05-31 ENCOUNTER — Inpatient Hospital Stay (HOSPITAL_BASED_OUTPATIENT_CLINIC_OR_DEPARTMENT_OTHER): Admitting: Oncology

## 2024-05-31 ENCOUNTER — Inpatient Hospital Stay (HOSPITAL_BASED_OUTPATIENT_CLINIC_OR_DEPARTMENT_OTHER): Admitting: Hospice and Palliative Medicine

## 2024-05-31 ENCOUNTER — Inpatient Hospital Stay

## 2024-05-31 ENCOUNTER — Other Ambulatory Visit: Payer: Self-pay

## 2024-05-31 ENCOUNTER — Ambulatory Visit
Admission: RE | Admit: 2024-05-31 | Discharge: 2024-05-31 | Disposition: A | Discharge status: Home / Self Care | Source: Ambulatory Visit | Attending: Hospice and Palliative Medicine | Admitting: Hospice and Palliative Medicine

## 2024-05-31 VITALS — BP 117/87 | HR 115 | Temp 98.5°F | Resp 18 | Wt 158.3 lb

## 2024-05-31 VITALS — BP 105/66 | HR 77 | Temp 97.9°F | Resp 16

## 2024-05-31 DIAGNOSIS — G893 Neoplasm related pain (acute) (chronic): Secondary | ICD-10-CM | POA: Diagnosis not present

## 2024-05-31 DIAGNOSIS — G629 Polyneuropathy, unspecified: Secondary | ICD-10-CM | POA: Diagnosis not present

## 2024-05-31 DIAGNOSIS — G620 Drug-induced polyneuropathy: Secondary | ICD-10-CM | POA: Diagnosis not present

## 2024-05-31 DIAGNOSIS — Z5112 Encounter for antineoplastic immunotherapy: Secondary | ICD-10-CM | POA: Diagnosis present

## 2024-05-31 DIAGNOSIS — Z5111 Encounter for antineoplastic chemotherapy: Secondary | ICD-10-CM | POA: Diagnosis not present

## 2024-05-31 DIAGNOSIS — R634 Abnormal weight loss: Secondary | ICD-10-CM | POA: Diagnosis not present

## 2024-05-31 DIAGNOSIS — C8330 Diffuse large B-cell lymphoma, unspecified site: Secondary | ICD-10-CM

## 2024-05-31 DIAGNOSIS — Z515 Encounter for palliative care: Secondary | ICD-10-CM | POA: Diagnosis not present

## 2024-05-31 DIAGNOSIS — R5383 Other fatigue: Secondary | ICD-10-CM | POA: Diagnosis not present

## 2024-05-31 DIAGNOSIS — A419 Sepsis, unspecified organism: Secondary | ICD-10-CM | POA: Diagnosis not present

## 2024-05-31 DIAGNOSIS — R000 Tachycardia, unspecified: Secondary | ICD-10-CM

## 2024-05-31 DIAGNOSIS — Z5189 Encounter for other specified aftercare: Secondary | ICD-10-CM | POA: Diagnosis not present

## 2024-05-31 DIAGNOSIS — T451X5A Adverse effect of antineoplastic and immunosuppressive drugs, initial encounter: Secondary | ICD-10-CM | POA: Diagnosis not present

## 2024-05-31 DIAGNOSIS — I2693 Single subsegmental pulmonary embolism without acute cor pulmonale: Secondary | ICD-10-CM | POA: Diagnosis not present

## 2024-05-31 DIAGNOSIS — Z7901 Long term (current) use of anticoagulants: Secondary | ICD-10-CM | POA: Diagnosis not present

## 2024-05-31 DIAGNOSIS — Z79899 Other long term (current) drug therapy: Secondary | ICD-10-CM | POA: Diagnosis not present

## 2024-05-31 DIAGNOSIS — C83398 Diffuse large b-cell lymphoma of other extranodal and solid organ sites: Secondary | ICD-10-CM | POA: Diagnosis present

## 2024-05-31 DIAGNOSIS — Z7952 Long term (current) use of systemic steroids: Secondary | ICD-10-CM | POA: Diagnosis not present

## 2024-05-31 DIAGNOSIS — I2699 Other pulmonary embolism without acute cor pulmonale: Secondary | ICD-10-CM

## 2024-05-31 DIAGNOSIS — Z79891 Long term (current) use of opiate analgesic: Secondary | ICD-10-CM | POA: Diagnosis not present

## 2024-05-31 LAB — CBC WITH DIFFERENTIAL (CANCER CENTER ONLY)
Abs Immature Granulocytes: 0.03 K/uL (ref 0.00–0.07)
Basophils Absolute: 0.1 K/uL (ref 0.0–0.1)
Basophils Relative: 1 %
Eosinophils Absolute: 0 K/uL (ref 0.0–0.5)
Eosinophils Relative: 0 %
HCT: 33.7 % — ABNORMAL LOW (ref 36.0–46.0)
Hemoglobin: 11.3 g/dL — ABNORMAL LOW (ref 12.0–15.0)
Immature Granulocytes: 1 %
Lymphocytes Relative: 16 %
Lymphs Abs: 0.7 K/uL (ref 0.7–4.0)
MCH: 31.3 pg (ref 26.0–34.0)
MCHC: 33.5 g/dL (ref 30.0–36.0)
MCV: 93.4 fL (ref 80.0–100.0)
Monocytes Absolute: 0.7 K/uL (ref 0.1–1.0)
Monocytes Relative: 16 %
Neutro Abs: 2.7 K/uL (ref 1.7–7.7)
Neutrophils Relative %: 66 %
Platelet Count: 426 K/uL — ABNORMAL HIGH (ref 150–400)
RBC: 3.61 MIL/uL — ABNORMAL LOW (ref 3.87–5.11)
RDW: 14.9 % (ref 11.5–15.5)
WBC Count: 4.2 K/uL (ref 4.0–10.5)
nRBC: 0 % (ref 0.0–0.2)

## 2024-05-31 LAB — CMP (CANCER CENTER ONLY)
ALT: 14 U/L (ref 0–44)
AST: 21 U/L (ref 15–41)
Albumin: 3.7 g/dL (ref 3.5–5.0)
Alkaline Phosphatase: 49 U/L (ref 38–126)
Anion gap: 10 (ref 5–15)
BUN: 11 mg/dL (ref 8–23)
CO2: 25 mmol/L (ref 22–32)
Calcium: 8.9 mg/dL (ref 8.9–10.3)
Chloride: 101 mmol/L (ref 98–111)
Creatinine: 0.57 mg/dL (ref 0.44–1.00)
GFR, Estimated: >60 mL/min (ref >60)
Glucose, Bld: 138 mg/dL — ABNORMAL HIGH (ref 70–99)
Potassium: 3.4 mmol/L — ABNORMAL LOW (ref 3.5–5.1)
Sodium: 136 mmol/L (ref 135–145)
Total Bilirubin: 0.5 mg/dL (ref 0.0–1.2)
Total Protein: 6.9 g/dL (ref 6.5–8.1)

## 2024-05-31 MED ORDER — HEPARIN SOD (PORK) LOCK FLUSH 100 UNIT/ML IV SOLN
500.0000 [IU] | Freq: Once | INTRAVENOUS | Status: AC
  Administered 2024-05-31: 500 [IU] via INTRAVENOUS

## 2024-05-31 MED ORDER — ACETAMINOPHEN 325 MG PO TABS
650.0000 mg | ORAL_TABLET | Freq: Once | ORAL | Status: AC
  Administered 2024-05-31: 650 mg via ORAL
  Filled 2024-05-31: qty 2

## 2024-05-31 MED ORDER — DULOXETINE HCL 30 MG PO CPEP
30.0000 mg | ORAL_CAPSULE | Freq: Every day | ORAL | 3 refills | Status: AC
  Filled 2024-05-31: qty 30, 30d supply, fill #0
  Filled 2024-06-29: qty 30, 30d supply, fill #1
  Filled 2024-08-02: qty 30, 30d supply, fill #2

## 2024-05-31 MED ORDER — DIPHENHYDRAMINE HCL 25 MG PO CAPS
50.0000 mg | ORAL_CAPSULE | Freq: Once | ORAL | Status: AC
  Administered 2024-05-31: 50 mg via ORAL
  Filled 2024-05-31: qty 2

## 2024-05-31 MED ORDER — SODIUM CHLORIDE 0.9 % IV SOLN
150.0000 mg | Freq: Once | INTRAVENOUS | Status: AC
  Administered 2024-05-31: 150 mg via INTRAVENOUS
  Filled 2024-05-31: qty 150

## 2024-05-31 MED ORDER — SODIUM CHLORIDE 0.9 % IV SOLN
INTRAVENOUS | Status: DC
  Filled 2024-05-31: qty 250

## 2024-05-31 MED ORDER — POTASSIUM CHLORIDE CRYS ER 20 MEQ PO TBCR
20.0000 meq | EXTENDED_RELEASE_TABLET | Freq: Every day | ORAL | 0 refills | Status: DC
  Filled 2024-05-31: qty 7, 7d supply, fill #0

## 2024-05-31 MED ORDER — GADOBUTROL 1 MMOL/ML IV SOLN
7.0000 mL | Freq: Once | INTRAVENOUS | Status: AC | PRN
  Administered 2024-05-31: 7 mL via INTRAVENOUS

## 2024-05-31 MED ORDER — PALONOSETRON HCL INJECTION 0.25 MG/5ML
0.2500 mg | Freq: Once | INTRAVENOUS | Status: AC
  Administered 2024-05-31: 0.25 mg via INTRAVENOUS
  Filled 2024-05-31: qty 5

## 2024-05-31 MED ORDER — SODIUM CHLORIDE 0.9 % IV SOLN
400.0000 mg/m2 | Freq: Once | INTRAVENOUS | Status: AC
  Administered 2024-05-31: 720 mg via INTRAVENOUS
  Filled 2024-05-31: qty 36

## 2024-05-31 MED ORDER — DOXORUBICIN HCL CHEMO IV INJECTION 2 MG/ML
25.0000 mg/m2 | Freq: Once | INTRAVENOUS | Status: AC
  Administered 2024-05-31: 46 mg via INTRAVENOUS
  Filled 2024-05-31: qty 23

## 2024-05-31 MED ORDER — SODIUM CHLORIDE 0.9 % IV SOLN
15.0000 mg | Freq: Once | INTRAVENOUS | Status: AC
  Administered 2024-05-31: 15 mg via INTRAVENOUS
  Filled 2024-05-31: qty 1.5

## 2024-05-31 MED ORDER — OXYCODONE HCL 10 MG PO TABS
10.0000 mg | ORAL_TABLET | ORAL | 0 refills | Status: DC | PRN
  Filled 2024-05-31: qty 60, 10d supply, fill #0

## 2024-05-31 MED ORDER — HEPARIN SOD (PORK) LOCK FLUSH 100 UNIT/ML IV SOLN
INTRAVENOUS | Status: AC
  Filled 2024-05-31: qty 5

## 2024-05-31 MED ORDER — SODIUM CHLORIDE 0.9 % IV SOLN: 375.0000 mg/m2 | Freq: Once | INTRAVENOUS | Status: DC

## 2024-05-31 MED ORDER — SODIUM CHLORIDE 0.9 % IV SOLN
Freq: Once | INTRAVENOUS | Status: AC
  Filled 2024-05-31: qty 250

## 2024-05-31 MED ORDER — SODIUM CHLORIDE 0.9 % IV SOLN
375.0000 mg/m2 | Freq: Once | INTRAVENOUS | Status: AC
  Administered 2024-05-31: 700 mg via INTRAVENOUS
  Filled 2024-05-31: qty 20

## 2024-05-31 MED ORDER — GABAPENTIN 400 MG PO CAPS
400.0000 mg | ORAL_CAPSULE | Freq: Three times a day (TID) | ORAL | 0 refills | Status: DC
  Filled 2024-05-31: qty 90, 30d supply, fill #0

## 2024-05-31 MED ORDER — PREDNISONE 20 MG PO TABS
100.0000 mg | ORAL_TABLET | ORAL | 4 refills | Status: DC
  Filled 2024-05-31: qty 20, 4d supply, fill #0
  Filled 2024-06-02 – 2024-06-03 (×2): qty 20, 4d supply, fill #1

## 2024-05-31 MED ORDER — TRAZODONE HCL 50 MG PO TABS
50.0000 mg | ORAL_TABLET | Freq: Every evening | ORAL | 3 refills | Status: AC | PRN
  Filled 2024-05-31: qty 30, 30d supply, fill #0
  Filled 2024-07-02: qty 30, 30d supply, fill #1

## 2024-05-31 NOTE — Assessment & Plan Note (Signed)
 Continue Oxycodone  5mg  Q4h PRN.  Fentanyl  patch 25mcg

## 2024-05-31 NOTE — Assessment & Plan Note (Signed)
 Chemotherapy treatment as planned above

## 2024-05-31 NOTE — Progress Notes (Signed)
 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  Past medical history of GERD, left ureteral stent placement. Followed by Dr Babara.  Recent hospital admission with neutropenic fever, sepsis, PE, UTI.  Patient receiving R-CHOP  Met with patient during infusion.  Reports that her appetite was decreased for about 2 weeks following treatment.  Had a medicine taste in her mouth and decreased desire to eat.  Drinks orgain shake 1 time a day.  Has been eating oatmeal.     Medications: MMW, omeprazole , compazine , KCL, zofran , prednisone    Labs: K 3.4, glucose 138   Anthropometrics:   Height: 64 inches Weight: 158 lb 4.8 oz  169 lb 10/29 184 lb on 8/29 BMI: 27  14% weight loss in the last 3 months   Estimated Energy Needs  Kcals: 1800-2100 Protein: 72-86 g Fluid: > 1.8 L   NUTRITION DIAGNOSIS: Inadequate oral intake related to cancer and related treatment side effects as evidenced by 14% weight loss in the last 3 months, decreased intake, taste alterations   INTERVENTION:  Discussed ways to increase calories and protein.  Handout provided Encouraged small frequent nibbles/mini snacks q 2-3 hours Continue oral nutrition supplement. Likes orgain.   Discussed strategies for taste change.  Handout provided Contact information   MONITORING, EVALUATION, GOAL: weight trends, intake   Next Visit: Wed, Dec 10 during infusion  Mykala Mccready B. Dasie SOLON, CSO, LDN Registered Dietitian [phone]

## 2024-05-31 NOTE — Progress Notes (Signed)
 Hematology/Oncology Consult Note [phone] [phone]   CHIEF COMPLAINTS/PURPOSE OF CONSULTATION:  DLBCL   ASSESSMENT & PLAN:   Cancer Staging  Diffuse large B-cell lymphoma (HCC) Staging form: Hodgkin and Non-Hodgkin Lymphoma, AJCC 8th Edition - Clinical stage from 04/26/2024: Stage II (Diffuse large B-cell lymphoma) - Signed by Babara Call, MD on 04/26/2024   Diffuse large B-cell lymphoma (HCC) DLBCL of left pelvic wall mass, Atypical t (8; 14), BCL2 BCL6 monosomy.  Negative for BCL2, BCL6 and MYC rearrangement PET scan evaluation showed mild hypermetabolism in spleen.  Nonspecific.  Likely stage I/II disease. Bone marrow biopsy is negative.   IPI score 1-2 Baseline Echo showed LVEF 55-60% Grade I diastolic dysfunction   Recommend systemic chemotherapy R CHOP   Labs are reviewed and discussed with patient. Proceed with cycle 2 R dose reduced CHP with D3 GCSF.  Hold Vincristine due to neurology.  She will take  D2-D5 prednisone  100mg  daily.  Repeat PET after this cycle of treatment.   Recommend Allopurinol 300mg  daily. Acyclovir 400mg  daily for prophylaxis.   Encounter for antineoplastic chemotherapy Chemotherapy treatment as planned above  Neoplasm related pain Continue Oxycodone  5mg  Q4h PRN.  Fentanyl  patch 25mcg   Neuropathy Sciatic pain/neuropathic pain.   Pre existing symptoms prior to chemotherapy. Left hip/buttock pain has improved after chemo, however worse of calf and foot pain is worse. Suspect that she has radiculopathy. Acute chemotherapy neuro toxicity is possible, unilateral symptom favors radiculopathy.  Obtain MRI lumbar for evaluation.  Hold off Vincristine  Gabapnetin 400mg  TID Refer to Neurology  Pulmonary embolism (HCC) Eliquis 5mg  BID   Orders Placed This Encounter  Procedures   NM PET Image Restag (PS) Skull Base To Thigh    Needs STAT read    Standing Status:   Future    Expected Date:   06/14/2024    Expiration Date:    05/31/2025    If indicated for the ordered procedure, I authorize the administration of a radiopharmaceutical per Radiology protocol:   Yes    Preferred imaging location?:   [facility]   CBC with Differential (Cancer Center Only)    Standing Status:   Future    Expected Date:   06/07/2024    Expiration Date:   09/05/2024   CMP (Cancer Center only)    Standing Status:   Future    Expected Date:   06/07/2024    Expiration Date:   09/05/2024   Follow up in 1 week All questions were answered. The patient knows to call the clinic with any problems, questions or concerns.  Call Babara, MD, PhD [facility] Hematology Oncology 05/31/2024    HISTORY OF PRESENTING ILLNESS:  Stacey [name] 63 y.o. female presents to establish care for large B-cell lymphoma I have reviewed her chart and materials related to her cancer extensively and collaborated history with the patient. Summary of oncologic history is as follows: Oncology History  Diffuse large B-cell lymphoma (HCC)  04/08/2024 Imaging   CT renal stone study showed  1. Mild left hydroureteronephrosis with periureteral/peripelvic stranding, no urinary calculi. 2. Imaging findings are compatible with recently passed left ureteral stone versus acute left pyelonephritis.   04/08/2024 Imaging   CT angiogram abdomen/pelvis with and without contrast  1. Obstructing 5.9 x 4.5 cm enhancing mass along the left pelvic sidewall causing left hydroureter, hydronephrosis, delayed nephrogram, and nephromegaly; mass abuts the left adnexa with loss of the intervening fat plane. This is highly concerning for malignant neoplasm. Etiology indeterminate. It's  possible that this could be arising from a primary urothelial neoplasm of the left ureter. Other primary neoplasms or metastatic disease not excluded. Correlation with tissue sampling is advised. 2. Patent abdominal vasculature without signs of mesenteric ischemia.   04/24/2024 Imaging   PET scan  showed  1. The posterior left pelvic sidewall mass is hypermetabolic, consistent with active lymphoma. 2. Mild splenic hypermetabolism relative to the liver. No splenomegaly. Cannot exclude lymphomatous involvement. 3. New small right pleural effusion. 4. No active lymphoma within the chest or neck. Mildly decreased sensitivity exam secondary to hypermetabolic brown fat. 5. Age advanced coronary artery atherosclerosis. Recommend assessment of coronary risk factors. 6.  Aortic Atherosclerosis    04/26/2024 Initial Diagnosis   Lymphoma (HCC)  Symptoms began on 03/10/2024 with pain radiating down the left leg, associated with the sciatic nerve. Initially managed with high doses of ibuprofen, which provided some relief, but the pain worsened within a week, leading to an emergency room visit where she received a steroid injection in her lower back.  04/08/2024 she returned to the hospital due to increased pain. A CT scan revealed a pelvic mass obstructing the ureter, and a stent was placed by a urologist.  Patient underwent biopsy of pelvis mass Pathology showed 1. Lymph node, needle/core biopsy, 18G cores infilatratic left pelvic sidewall mass :       LARGE B-CELL LYMPHOMA.  soft      tissue with atypical lymphoid infiltrate comprised predominantly of medium to      large sized cells.  Abundant mitotic figures are present.  Focal areas of      necrosis are noted.  Immunohistochemical stains reveal the malignant cells are      positive for CD20, PAX5, BCL6 (subset), MUM1, Bcl-2 and are negative for CD10      and cyclin D1.  CD30 does not show any significant expression. CD15 highlights      neutrophils. The Ki-67 proliferation index is high approximately 70%.  This lymphoma has a activated B-cell immunophenotype.   EBER-ish is pending and will be reported in an addendum.  Also,High-grade B-cell lymphoma FISH panel will be   ordered to exclude a double/triple hit lymphoma and results will be  reported in an addendum   She has experienced unintentional weight loss of 18 pounds since May, without any changes in diet or exercise. No night sweats, low-grade fever, or excessive sweating at night. Baseline health includes chronic back pain, acid reflux, prediabetes, and constipation.  She is taking gabapentin  100 mg once daily for nerve pain, described as sometimes dull and sometimes sharp. Oxycodone  5 mg every six hours for pain management, which helps reduce the pain slightly.  She notes reduced appetite, numbness, and tingling in her leg. Also mentions leg swelling, particularly at night when less active.   04/26/2024 Cancer Staging   Staging form: Hodgkin and Non-Hodgkin Lymphoma, AJCC 8th Edition - Clinical stage from 04/26/2024: Stage II (Diffuse large B-cell lymphoma) - Signed by Babara Call, MD on 04/26/2024 Stage prefix: Initial diagnosis   05/01/2024 Bone Marrow Biopsy   Bone marrow biopsy results showed  BONE MARROW, ASPIRATE, CLOT, CORE:  - Mildly hypocellular bone marrow with otherwise orderly trilineage hematopoiesis  - No morphologic or immunophenotypic evidence of the patient's recently diagnosed large B-cell lymphoma    05/10/2024 -  Chemotherapy   Patient is on Treatment Plan : NON-HODGKINS LYMPHOMA R-CHOP q21d x 4 cycles      Discussed the use of AI scribe software for clinical  note transcription with the patient, who gave verbal consent to proceed.   S/p first cycle of R CHOP. She was hospitalized due to grade 4 neutropenia, thrombocytopenia enterococcus faecalis UTI. Also there was incidental findings of small R LL subsegmental PE. She was treated with IV antibiotics, and finished planned course of oral amoxicillin. She report no fever or chills. Denies dysuria symptoms.   She experiences severe pain, numbness and tingingling sensation, from her buttock and radiating down to the bottom of her left foot. Lower back/hip pain has improved. worsening neuropathic pain,  primarily in her left calf and foot,  described as 'unbearable' and difficult to manage, even with light touch Pain management includes oxycodone  and Fentanyl  patch, which provide partial relief. She takes Gabapentin  400mg  TID She also uses ice packs and positions herself on her stomach to alleviate the pain.  Appetite is fair. She has lost weight.   MEDICAL HISTORY:  Past Medical History:  Diagnosis Date   Arthritis    left shoulder, neck, lower back  (01/11/2018)   Chronic lower back pain    Family history of adverse reaction to anesthesia    Mother has nausea   GERD (gastroesophageal reflux disease)    PONV (postoperative nausea and vomiting)    Pre-diabetes    UTI (urinary tract infection) 03/20/2021    SURGICAL HISTORY: Past Surgical History:  Procedure Laterality Date   APPENDECTOMY     CYSTOSCOPY W/ URETERAL STENT PLACEMENT Left 04/08/2024   Procedure: CYSTOSCOPY, WITH RETROGRADE PYELOGRAM AND URETERAL STENT INSERTION;  Surgeon: Watt Rush, MD;  Location: [facility] ORS;  Service: Urology;  Laterality: Left;   IR IMAGING GUIDED PORT INSERTION  05/01/2024   JOINT REPLACEMENT     TOTAL SHOULDER ARTHROPLASTY Left 01/11/2018   TOTAL SHOULDER ARTHROPLASTY Left 01/11/2018   Procedure: LEFT TOTAL SHOULDER ARTHROPLASTY;  Surgeon: Sharl Selinda Dover, MD;  Location: [facility] OR;  Service: Orthopedics;  Laterality: Left;  2.5 hrs   TOTAL SHOULDER ARTHROPLASTY Right 02/08/2020   Procedure: TOTAL SHOULDER ARTHROPLASTY;  Surgeon: Sharl Selinda Dover, MD;  Location: [facility] OR;  Service: Orthopedics;  Laterality: Right;  2.5 hrs RNFA    SOCIAL HISTORY: Social History   Socioeconomic History   Marital status: Married    Spouse name: Tristan Bramble   Number of children: Not on file   Years of education: Not on file   Highest education level: Not on file  Occupational History   Not on file  Tobacco Use   Smoking status: Never   Smokeless tobacco: Never  Vaping Use   Vaping status: Never  Used  Substance and Sexual Activity   Alcohol use: Not Currently    Comment: 01/11/2018 might have 1 drink/month   Drug use: Never   Sexual activity: Yes  Other Topics Concern   Not on file  Social History Narrative   Not on file   Social Drivers of Health   Financial Resource Strain: Low Risk  (03/30/2024)   Received from [facility]   Overall Financial Resource Strain (CARDIA)    Difficulty of Paying Living Expenses: Not hard at all  Food Insecurity: No Food Insecurity (05/29/2024)   Hunger Vital Sign    Worried About Running Out of Food in the Last Year: Never true    Ran Out of Food in the Last Year: Never true  Transportation Needs: No Transportation Needs (05/29/2024)   PRAPARE - Administrator, Civil Service (Medical): No    Lack of Transportation (  Non-Medical): No  Physical Activity: Not on file  Stress: Not on file  Social Connections: Socially Integrated (05/17/2024)   Social Connection and Isolation Panel    Frequency of Communication with Friends and Family: Three times a week    Frequency of Social Gatherings with Friends and Family: Three times a week    Attends Religious Services: 1 to 4 times per year    Active Member of Clubs or Organizations: Yes    Attends Banker Meetings: 1 to 4 times per year    Marital Status: Married  Catering Manager Violence: Not At Risk (05/17/2024)   Humiliation, Afraid, Rape, and Kick questionnaire    Fear of Current or Ex-Partner: No    Emotionally Abused: No    Physically Abused: No    Sexually Abused: No    FAMILY HISTORY: Family History  Problem Relation Age of Onset   Hypertension Mother    Heart disease Father    Heart attack Father    CAD Father    Colon cancer Neg Hx    Esophageal cancer Neg Hx    Rectal cancer Neg Hx    Stomach cancer Neg Hx     ALLERGIES:  is allergic to propofol .  MEDICATIONS:  Current Outpatient Medications  Medication Sig Dispense Refill    acyclovir (ZOVIRAX) 400 MG tablet Take 1 tablet (400 mg total) by mouth daily. 30 tablet 3   allopurinol (ZYLOPRIM) 300 MG tablet Take 1 tablet (300 mg total) by mouth daily. 30 tablet 3   apixaban (ELIQUIS) 5 MG TABS tablet Take 2 tablets (10 mg total) by mouth 2 (two) times daily for 3 days, THEN 1 tablet (5 mg total) 2 (two) times daily. 60 tablet 2   fentaNYL  (DURAGESIC ) 25 MCG/HR Place 1 patch onto the skin every 3 (three) days. 5 patch 0   gabapentin  (NEURONTIN ) 400 MG capsule Take 1 capsule (400 mg total) by mouth 3 (three) times daily. 90 capsule 0   lidocaine -prilocaine (EMLA) cream Apply to affected area once 30 g 3   loratadine (CLARITIN) 10 MG tablet Take 10 mg by mouth at bedtime.     magic mouthwash (multi-ingredient) oral suspension Swish and swallow 5-10 mLs 4 (four) times daily as needed for mouth pain. 480 mL 1   methocarbamol  (ROBAXIN ) 500 MG tablet Take 500 mg by mouth every 8 (eight) hours as needed for muscle spasms.  0   omeprazole  (PRILOSEC) 20 MG capsule TAKE 1 CAPSULE (20 MG TOTAL) BY MOUTH 2 (TWO) TIMES DAILY BEFORE A MEAL. 180 capsule 0   ondansetron  (ZOFRAN ) 8 MG tablet Take 1 tablet (8 mg total) by mouth every 8 (eight) hours as needed for nausea or vomiting. Start on the third day after cyclophosphamide chemotherapy. 30 tablet 1   oxybutynin (DITROPAN-XL) 10 MG 24 hr tablet TAKE 1 TABLET BY MOUTH EVERY DAY 30 tablet 0   potassium chloride SA (KLOR-CON M) 20 MEQ tablet Take 1 tablet (20 mEq total) by mouth daily. 7 tablet 0   prochlorperazine (COMPAZINE) 10 MG tablet Take 1 tablet (10 mg total) by mouth every 6 (six) hours as needed for nausea or vomiting. 30 tablet 6   tamsulosin (FLOMAX) 0.4 MG CAPS capsule TAKE 1 CAPSULE BY MOUTH EVERY DAY 30 capsule 0   traZODone (DESYREL) 50 MG tablet Take 1 tablet (50 mg total) by mouth at bedtime as needed for sleep. 30 tablet 3   DULoxetine (CYMBALTA) 30 MG capsule Take 1 capsule (30 mg total)  by mouth daily. 30 capsule 3    Oxycodone  HCl 10 MG TABS Take 1 tablet (10 mg total) by mouth every 4 (four) hours as needed (pain). 60 tablet 0   predniSONE  (DELTASONE ) 20 MG tablet Take 5 tablets (100 mg total) by mouth as directed. Take with food on days 2-5 of chemotherapy. 20 tablet 4   sodium phosphate  (FLEET) ENEM Place 133 mLs (1 enema total) rectally daily as needed for severe constipation. (Patient not taking: Reported on 05/31/2024) 3 mL 0   No current facility-administered medications for this visit.   Facility-Administered Medications Ordered in Other Visits  Medication Dose Route Frequency Provider Last Rate Last Admin   0.9 %  sodium chloride  infusion   Intravenous Continuous Babara Call, MD   Stopped at 05/31/24 1054   cyclophosphamide (CYTOXAN) 720 mg in sodium chloride  0.9 % 250 mL chemo infusion  400 mg/m2 (Order-Specific) Intravenous Once Tinna Kolker, MD       dexamethasone  (DECADRON ) 15 mg in sodium chloride  0.9 % 50 mL IVPB  15 mg Intravenous Once Kalonji Zurawski, MD 206 mL/hr at 05/31/24 1112 15 mg at 05/31/24 1112   DOXOrubicin (ADRIAMYCIN) chemo injection 46 mg  25 mg/m2 (Order-Specific) Intravenous Once Siani Utke, MD       riTUXimab-pvvr (RUXIENCE) 700 mg in sodium chloride  0.9 % 180 mL infusion  375 mg/m2 (Treatment Plan Recorded) Intravenous Once Babara Call, MD        Review of Systems  Constitutional:  Positive for fatigue and unexpected weight change. Negative for appetite change, chills and fever.  HENT:   Negative for hearing loss and voice change.   Eyes:  Negative for eye problems.  Respiratory:  Negative for chest tightness and cough.   Cardiovascular:  Negative for chest pain.  Gastrointestinal:  Negative for abdominal distention, abdominal pain and blood in stool.  Endocrine: Negative for hot flashes.  Genitourinary:  Negative for difficulty urinating and frequency.   Musculoskeletal:  Positive for back pain. Negative for arthralgias.  Skin:  Negative for itching and rash.  Neurological:  Positive for  numbness. Negative for extremity weakness.       Left buttock pain radiate down to calf and foot.   Hematological:  Negative for adenopathy.  Psychiatric/Behavioral:  Negative for confusion.      PHYSICAL EXAMINATION: ECOG PERFORMANCE STATUS: 2 - Symptomatic, <50% confined to bed  Vitals:   05/31/24 0842 05/31/24 0912  BP: 117/87   Pulse: (!) 116 (!) 115  Resp: 18   Temp: 98.5 F (36.9 C)   SpO2: 97%    Filed Weights   05/31/24 0842  Weight: 158 lb 4.8 oz (71.8 kg)    Physical Exam Constitutional:      General: She is not in acute distress.    Appearance: She is not diaphoretic.  HENT:     Head: Normocephalic and atraumatic.     Mouth/Throat:     Pharynx: No oropharyngeal exudate.  Eyes:     General: No scleral icterus.    Pupils: Pupils are equal, round, and reactive to light.  Cardiovascular:     Rate and Rhythm: Normal rate and regular rhythm.     Heart sounds: No murmur heard. Pulmonary:     Effort: Pulmonary effort is normal. No respiratory distress.     Breath sounds: Normal breath sounds. No wheezing.  Abdominal:     General: There is no distension.     Palpations: Abdomen is soft.     Tenderness: There  is no abdominal tenderness.  Musculoskeletal:        General: Normal range of motion.     Cervical back: Normal range of motion and neck supple.  Skin:    General: Skin is warm and dry.     Findings: No erythema.  Neurological:     Mental Status: She is alert and oriented to person, place, and time.     Cranial Nerves: No cranial nerve deficit.     Motor: No abnormal muscle tone.     Coordination: Coordination normal.  Psychiatric:        Mood and Affect: Affect normal.      LABORATORY DATA:  I have reviewed the data as listed    Latest Ref Rng & Units 05/31/2024    8:18 AM 05/24/2024    4:20 AM 05/23/2024    6:37 AM  CBC  WBC 4.0 - 10.5 K/uL 4.2  2.9  2.5   Hemoglobin 12.0 - 15.0 g/dL 88.6  8.5  8.6   Hematocrit 36.0 - 46.0 % 33.7  26.1   26.5   Platelets 150 - 400 K/uL 426  119  101       Latest Ref Rng & Units 05/31/2024    8:18 AM 05/22/2024    4:47 AM 05/21/2024    5:14 AM  CMP  Glucose 70 - 99 mg/dL 861  894  896   BUN 8 - 23 mg/dL 11  11  9    Creatinine 0.44 - 1.00 mg/dL 9.42  9.69  9.52   Sodium 135 - 145 mmol/L 136  138  137   Potassium 3.5 - 5.1 mmol/L 3.4  3.7  3.5   Chloride 98 - 111 mmol/L 101  100  101   CO2 22 - 32 mmol/L 25  28  28    Calcium 8.9 - 10.3 mg/dL 8.9  8.2  8.1   Total Protein 6.5 - 8.1 g/dL 6.9     Total Bilirubin 0.0 - 1.2 mg/dL 0.5     Alkaline Phos 38 - 126 U/L 49     AST 15 - 41 U/L 21     ALT 0 - 44 U/L 14        RADIOGRAPHIC STUDIES: I have personally reviewed the radiological images as listed and agreed with the findings in the report. US  Venous Img Lower Bilateral (DVT) Result Date: 05/19/2024 CLINICAL DATA:  Lower extremity pain. EXAM: BILATERAL LOWER EXTREMITY VENOUS DOPPLER ULTRASOUND TECHNIQUE: Gray-scale sonography with graded compression, as well as color Doppler and duplex ultrasound were performed to evaluate the lower extremity deep venous systems from the level of the common femoral vein and including the common femoral, femoral, profunda femoral, popliteal and calf veins including the posterior tibial, peroneal and gastrocnemius veins when visible. The superficial great saphenous vein was also interrogated. Spectral Doppler was utilized to evaluate flow at rest and with distal augmentation maneuvers in the common femoral, femoral and popliteal veins. COMPARISON:  None Available. FINDINGS: RIGHT LOWER EXTREMITY Common Femoral Vein: No evidence of thrombus. Normal compressibility, respiratory phasicity and response to augmentation. Saphenofemoral Junction: No evidence of thrombus. Normal compressibility and flow on color Doppler imaging. Profunda Femoral Vein: No evidence of thrombus. Normal compressibility and flow on color Doppler imaging. Femoral Vein: No evidence of thrombus.  Normal compressibility, respiratory phasicity and response to augmentation. Popliteal Vein: No evidence of thrombus. Normal compressibility, respiratory phasicity and response to augmentation. Calf Veins: No evidence of thrombus. Normal compressibility and flow on color  Doppler imaging. Superficial Great Saphenous Vein: No evidence of thrombus. Normal compressibility. Venous Reflux:  None. Other Findings: No evidence of superficial thrombophlebitis or abnormal fluid collection. LEFT LOWER EXTREMITY Common Femoral Vein: No evidence of thrombus. Normal compressibility, respiratory phasicity and response to augmentation. Saphenofemoral Junction: No evidence of thrombus. Normal compressibility and flow on color Doppler imaging. Profunda Femoral Vein: No evidence of thrombus. Normal compressibility and flow on color Doppler imaging. Femoral Vein: No evidence of thrombus. Normal compressibility, respiratory phasicity and response to augmentation. Popliteal Vein: No evidence of thrombus. Normal compressibility, respiratory phasicity and response to augmentation. Calf Veins: No evidence of thrombus. Normal compressibility and flow on color Doppler imaging. Superficial Great Saphenous Vein: No evidence of thrombus. Normal compressibility. Venous Reflux:  None. Other Findings: No evidence of superficial thrombophlebitis or abnormal fluid collection. IMPRESSION: No evidence of deep venous thrombosis in either lower extremity. Electronically Signed   By: Marcey Moan M.D.   On: 05/19/2024 17:20   CT Angio Chest Pulmonary Embolism (PE) W or WO Contrast Result Date: 05/18/2024 CLINICAL DATA:  Concern for pulmonary embolism.  Lymphoma. EXAM: CT ANGIOGRAPHY CHEST WITH CONTRAST TECHNIQUE: Multidetector CT imaging of the chest was performed using the standard protocol during bolus administration of intravenous contrast. Multiplanar CT image reconstructions and MIPs were obtained to evaluate the vascular anatomy. RADIATION DOSE  REDUCTION: This exam was performed according to the departmental dose-optimization program which includes automated exposure control, adjustment of the mA and/or kV according to patient size and/or use of iterative reconstruction technique. CONTRAST:  50mL OMNIPAQUE  IOHEXOL  350 MG/ML SOLN COMPARISON:  Chest radiograph dated 05/17/2024. FINDINGS: Cardiovascular: There is no cardiomegaly or pericardial effusion. The thoracic aorta is unremarkable. The origins of the great vessels of the aortic arch are patent. Right-sided Port-A-Cath with tip in the right atrium at the cavoatrial junction. Linear nonocclusive right lower lobe subsegmental pulmonary artery embolus, age indeterminate. No CT evidence of right heart straining. Mediastinum/Nodes: No hilar or mediastinal adenopathy. The esophagus and the thyroid gland are grossly unremarkable no mediastinal fluid collection. Lungs/Pleura: Minimal bibasilar linear atelectasis. No focal consolidation, pleural effusion, or pneumothorax. The central airways are patent. Upper Abdomen: No acute abnormality. Musculoskeletal: Degenerative changes of spine. No acute osseous pathology. Bilateral shoulder arthroplasties. Review of the MIP images confirms the above findings. IMPRESSION: 1. Age indeterminate linear nonocclusive right lower lobe subsegmental pulmonary artery embolus. No CT evidence of right heart straining. 2. No focal consolidation. These results will be called to the ordering clinician or representative by the Radiologist Assistant, and communication documented in the PACS or [employer]. Electronically Signed   By: Vanetta Chou M.D.   On: 05/18/2024 14:56   CT ABDOMEN PELVIS W CONTRAST Result Date: 05/17/2024 CLINICAL DATA:  Abdominal pain.  Concern for kidney stone. EXAM: CT ABDOMEN AND PELVIS WITH CONTRAST TECHNIQUE: Multidetector CT imaging of the abdomen and pelvis was performed using the standard protocol following bolus administration of intravenous  contrast. RADIATION DOSE REDUCTION: This exam was performed according to the departmental dose-optimization program which includes automated exposure control, adjustment of the mA and/or kV according to patient size and/or use of iterative reconstruction technique. CONTRAST:  OMNIPAQUE  IOHEXOL  300 MG/ML  SOLN COMPARISON:  CT abdomen pelvis dated 04/08/2024. FINDINGS: Lower chest: The visualized lung bases are clear. Tiny nonocclusive embolus in the right lower lobe subsegmental branch (2/2). Chest CT may provide better evaluation. No intra-abdominal free air or free fluid. Hepatobiliary: The liver is unremarkable. No biliary dilatation. The gallbladder is  unremarkable. Pancreas: Unremarkable. No pancreatic ductal dilatation or surrounding inflammatory changes. Spleen: Normal in size without focal abnormality. Adrenals/Urinary Tract: The adrenal glands unremarkable. Left ureteral stent with proximal tip in the inferior pole collecting system and distal end in the urinary bladder. There is mild left hydronephrosis. There is urothelial enhancement of the left renal collecting system and ureter suggestive of ascending UTI. No stone identified. The right kidney, right ureter, and urinary bladder appear unremarkable. Stomach/Bowel: Moderate stool throughout the colon. There is no bowel obstruction or active inflammation. Appendectomy. Vascular/Lymphatic: The abdominal aorta and IVC unremarkable. No portal venous gas. There is no adenopathy. Reproductive: The uterus is grossly unremarkable. Other: Interval decrease in the masslike soft tissues thickening of the left pelvic sidewall measuring approximately 5.8 x 3.3 cm in greatest axial dimensions (previously 6.7 x 4.8 cm). A 1.4 x 1.2 cm rim enhancing low attenuating focus within this soft tissue mass (coronal 52/5) suspicious for an area of phlegmonous change or developing abscess. Musculoskeletal: Degenerative changes of the spine. No acute osseous pathology.  IMPRESSION: 1. Left ureteral stent with mild left hydronephrosis and findings suggestive of ascending UTI. Correlation with urinalysis recommended. 2. Interval decrease in the masslike soft tissues thickening of the left pelvic sidewall. A 1.4 x 1.2 cm rim enhancing low attenuating focus within this soft tissue mass suspicious for an area of phlegmonous change or developing abscess. 3. Tiny nonocclusive embolus in the right lower lobe subsegmental branch. Chest CT may provide better evaluation. These results were called by telephone at the time of interpretation on 05/17/2024 at 6:28 pm to provider LAMAR PRICE , who verbally acknowledged these results. Electronically Signed   By: Vanetta Chou M.D.   On: 05/17/2024 18:39   DG Chest Port 1 View Result Date: 05/17/2024 CLINICAL DATA:  Sepsis. EXAM: PORTABLE CHEST 1 VIEW COMPARISON:  Chest radiograph dated 09/17/2012 FINDINGS: Right-sided PICC with tip over central SVC. No focal consolidation, pleural effusion, pneumothorax. The cardiac silhouette is within limits. No acute osseous pathology. Bilateral shoulder arthroplasties. IMPRESSION: No active disease. Electronically Signed   By: Vanetta Chou M.D.   On: 05/17/2024 16:38

## 2024-05-31 NOTE — Progress Notes (Signed)
 Rapid Infusion Rituximab  Pharmacist Evaluation  Stacey [name] is a 63 y.o. female being treated with rituximab  for lymphoma. This patient may be considered for RIR.   A pharmacist has verified the patient tolerated rituximab  infusions per the [facility] standard infusion protocol without grade 3-4 infusion reactions. The treatment plan will be updated to reflect RIR if the patient qualifies per the checklist below:   Age > 73 years old Yes   Clinically significant cardiovascular disease No   Circulating lymphocyte count < 5000/uL prior to cycle two Yes  Lab Results  Component Value Date   LYMPHSABS 0.7 05/31/2024    Prior documented grade 3-4 infusion reaction to rituximab  No   Prior documented grade 1-2 infusion reaction to rituximab  (If YES, Pharmacist will confirm with Physician if patient is still a candidate for RIR) No   Previous rituximab  infusion within the past 6 months Yes   Treatment Plan updated orders to reflect RIR Yes    Stacey [name] does meet the criteria for Rapid Infusion Rituximab . This patient is going to be switched to rapid infusion rituximab .   Stacey [name], PharmD, BCPS Clinical Pharmacist   05/31/24 10:55 AM

## 2024-05-31 NOTE — Patient Instructions (Signed)

## 2024-05-31 NOTE — Progress Notes (Signed)
 Palliative Medicine [facility] at [facility] [phone] [phone]   Name: Stacey [name] Date: 05/31/2024 MRN: [id number]  DOB: July 04, 1961  Patient Care Team: [name], DEVONNA as PCP - General (Physician Assistant) Babara Call, MD as Consulting Physician (Oncology)    REASON FOR CONSULTATION: Stacey [name] is a 63 y.o. female with multiple medical problems including diffuse large B-cell lymphoma on chemotherapy, history of left ureteral stent.  Patient has had pain and was referred to palliative care to address goals of manage ongoing symptoms.  SOCIAL HISTORY:     reports that she has never smoked. She has never used smokeless tobacco. She reports that she does not currently use alcohol. She reports that she does not use drugs.  Patient is married lives at home with her husband.  ADVANCE DIRECTIVES:    CODE STATUS:   PAST MEDICAL HISTORY: Past Medical History:  Diagnosis Date   Arthritis    left shoulder, neck, lower back  (01/11/2018)   Chronic lower back pain    Family history of adverse reaction to anesthesia    Mother has nausea   GERD (gastroesophageal reflux disease)    PONV (postoperative nausea and vomiting)    Pre-diabetes    UTI (urinary tract infection) 03/20/2021    PAST SURGICAL HISTORY:  Past Surgical History:  Procedure Laterality Date   APPENDECTOMY     CYSTOSCOPY W/ URETERAL STENT PLACEMENT Left 04/08/2024   Procedure: CYSTOSCOPY, WITH RETROGRADE PYELOGRAM AND URETERAL STENT INSERTION;  Surgeon: Watt Rush, MD;  Location: [facility] ORS;  Service: Urology;  Laterality: Left;   IR IMAGING GUIDED PORT INSERTION  05/01/2024   JOINT REPLACEMENT     TOTAL SHOULDER ARTHROPLASTY Left 01/11/2018   TOTAL SHOULDER ARTHROPLASTY Left 01/11/2018   Procedure: LEFT TOTAL SHOULDER ARTHROPLASTY;  Surgeon: Sharl Selinda Dover, MD;  Location: [facility] OR;  Service: Orthopedics;  Laterality: Left;  2.5 hrs   TOTAL  SHOULDER ARTHROPLASTY Right 02/08/2020   Procedure: TOTAL SHOULDER ARTHROPLASTY;  Surgeon: Sharl Selinda Dover, MD;  Location: [facility] OR;  Service: Orthopedics;  Laterality: Right;  2.5 hrs RNFA    HEMATOLOGY/ONCOLOGY HISTORY:  Oncology History  Diffuse large B-cell lymphoma (HCC)  04/08/2024 Imaging   CT renal stone study showed  1. Mild left hydroureteronephrosis with periureteral/peripelvic stranding, no urinary calculi. 2. Imaging findings are compatible with recently passed left ureteral stone versus acute left pyelonephritis.   04/08/2024 Imaging   CT angiogram abdomen/pelvis with and without contrast  1. Obstructing 5.9 x 4.5 cm enhancing mass along the left pelvic sidewall causing left hydroureter, hydronephrosis, delayed nephrogram, and nephromegaly; mass abuts the left adnexa with loss of the intervening fat plane. This is highly concerning for malignant neoplasm. Etiology indeterminate. It's possible that this could be arising from a primary urothelial neoplasm of the left ureter. Other primary neoplasms or metastatic disease not excluded. Correlation with tissue sampling is advised. 2. Patent abdominal vasculature without signs of mesenteric ischemia.   04/24/2024 Imaging   PET scan showed  1. The posterior left pelvic sidewall mass is hypermetabolic, consistent with active lymphoma. 2. Mild splenic hypermetabolism relative to the liver. No splenomegaly. Cannot exclude lymphomatous involvement. 3. New small right pleural effusion. 4. No active lymphoma within the chest or neck. Mildly decreased sensitivity exam secondary to hypermetabolic brown fat. 5. Age advanced coronary artery atherosclerosis. Recommend assessment of coronary risk factors. 6.  Aortic Atherosclerosis    04/26/2024 Initial Diagnosis   Lymphoma (HCC)  Symptoms  began on 03/10/2024 with pain radiating down the left leg, associated with the sciatic nerve. Initially managed with high doses of ibuprofen,  which provided some relief, but the pain worsened within a week, leading to an emergency room visit where she received a steroid injection in her lower back.  04/08/2024 she returned to the hospital due to increased pain. A CT scan revealed a pelvic mass obstructing the ureter, and a stent was placed by a urologist.  Patient underwent biopsy of pelvis mass Pathology showed 1. Lymph node, needle/core biopsy, 18G cores infilatratic left pelvic sidewall mass :       LARGE B-CELL LYMPHOMA.  soft      tissue with atypical lymphoid infiltrate comprised predominantly of medium to      large sized cells.  Abundant mitotic figures are present.  Focal areas of      necrosis are noted.  Immunohistochemical stains reveal the malignant cells are      positive for CD20, PAX5, BCL6 (subset), MUM1, Bcl-2 and are negative for CD10      and cyclin D1.  CD30 does not show any significant expression. CD15 highlights      neutrophils. The Ki-67 proliferation index is high approximately 70%.  This lymphoma has a activated B-cell immunophenotype.   EBER-ish is pending and will be reported in an addendum.  Also,High-grade B-cell lymphoma FISH panel will be   ordered to exclude a double/triple hit lymphoma and results will be reported in an addendum   She has experienced unintentional weight loss of 18 pounds since May, without any changes in diet or exercise. No night sweats, low-grade fever, or excessive sweating at night. Baseline health includes chronic back pain, acid reflux, prediabetes, and constipation.  She is taking gabapentin  100 mg once daily for nerve pain, described as sometimes dull and sometimes sharp. Oxycodone  5 mg every six hours for pain management, which helps reduce the pain slightly.  She notes reduced appetite, numbness, and tingling in her leg. Also mentions leg swelling, particularly at night when less active.   04/26/2024 Cancer Staging   Staging form: Hodgkin and Non-Hodgkin Lymphoma, AJCC  8th Edition - Clinical stage from 04/26/2024: Stage II (Diffuse large B-cell lymphoma) - Signed by Babara Call, MD on 04/26/2024 Stage prefix: Initial diagnosis   05/01/2024 Bone Marrow Biopsy   Bone marrow biopsy results showed  BONE MARROW, ASPIRATE, CLOT, CORE:  - Mildly hypocellular bone marrow with otherwise orderly trilineage hematopoiesis  - No morphologic or immunophenotypic evidence of the patient's recently diagnosed large B-cell lymphoma    05/10/2024 -  Chemotherapy   Patient is on Treatment Plan : NON-HODGKINS LYMPHOMA R-CHOP q21d x 4 cycles       ALLERGIES:  is allergic to propofol .  MEDICATIONS:  Current Outpatient Medications  Medication Sig Dispense Refill   acyclovir  (ZOVIRAX ) 400 MG tablet Take 1 tablet (400 mg total) by mouth daily. 30 tablet 3   allopurinol  (ZYLOPRIM ) 300 MG tablet Take 1 tablet (300 mg total) by mouth daily. 30 tablet 3   apixaban  (ELIQUIS ) 5 MG TABS tablet Take 2 tablets (10 mg total) by mouth 2 (two) times daily for 3 days, THEN 1 tablet (5 mg total) 2 (two) times daily. 60 tablet 2   fentaNYL  (DURAGESIC ) 25 MCG/HR Place 1 patch onto the skin every 3 (three) days. 5 patch 0   gabapentin  (NEURONTIN ) 400 MG capsule Take 1 capsule (400 mg total) by mouth 3 (three) times daily. 90 capsule 0   lidocaine -prilocaine  (  EMLA) cream Apply to affected area once 30 g 3   loratadine (CLARITIN) 10 MG tablet Take 10 mg by mouth at bedtime.     magic mouthwash (multi-ingredient) oral suspension Swish and swallow 5-10 mLs 4 (four) times daily as needed for mouth pain. 480 mL 1   methocarbamol  (ROBAXIN ) 500 MG tablet Take 500 mg by mouth every 8 (eight) hours as needed for muscle spasms.  0   omeprazole  (PRILOSEC) 20 MG capsule TAKE 1 CAPSULE (20 MG TOTAL) BY MOUTH 2 (TWO) TIMES DAILY BEFORE A MEAL. 180 capsule 0   ondansetron  (ZOFRAN ) 8 MG tablet Take 1 tablet (8 mg total) by mouth every 8 (eight) hours as needed for nausea or vomiting. Start on the third day after  cyclophosphamide chemotherapy. 30 tablet 1   oxybutynin (DITROPAN-XL) 10 MG 24 hr tablet TAKE 1 TABLET BY MOUTH EVERY DAY 30 tablet 0   oxyCODONE  (OXY IR/ROXICODONE ) 5 MG immediate release tablet Take 1 tablet (5 mg total) by mouth every 4 (four) hours as needed. 42 tablet 0   potassium chloride SA (KLOR-CON M) 20 MEQ tablet Take 1 tablet (20 mEq total) by mouth daily. 7 tablet 0   predniSONE  (DELTASONE ) 20 MG tablet Take 5 tablets (100 mg total) by mouth as directed. Take with food on days 2-5 of chemotherapy. 20 tablet 4   prochlorperazine (COMPAZINE) 10 MG tablet Take 1 tablet (10 mg total) by mouth every 6 (six) hours as needed for nausea or vomiting. 30 tablet 6   sodium phosphate  (FLEET) ENEM Place 133 mLs (1 enema total) rectally daily as needed for severe constipation. (Patient not taking: Reported on 05/31/2024) 3 mL 0   tamsulosin (FLOMAX) 0.4 MG CAPS capsule TAKE 1 CAPSULE BY MOUTH EVERY DAY 30 capsule 0   traZODone (DESYREL) 50 MG tablet Take 1 tablet (50 mg total) by mouth at bedtime as needed for sleep. 30 tablet 3   No current facility-administered medications for this visit.   Facility-Administered Medications Ordered in Other Visits  Medication Dose Route Frequency Provider Last Rate Last Admin   0.9 %  sodium chloride  infusion   Intravenous Once Babara Call, MD 999 mL/hr at 05/31/24 0929 New Bag at 05/31/24 0929    VITAL SIGNS: There were no vitals taken for this visit. There were no vitals filed for this visit.  Estimated body mass index is 27.17 kg/m as calculated from the following:   Height as of 05/17/24: 5' 4 (1.626 m).   Weight as of an earlier encounter on 05/31/24: 158 lb 4.8 oz (71.8 kg).  LABS: CBC:    Component Value Date/Time   WBC 4.2 05/31/2024 0818   WBC 2.9 (L) 05/24/2024 0420   HGB 11.3 (L) 05/31/2024 0818   HGB 14.5 09/17/2012 2231   HCT 33.7 (L) 05/31/2024 0818   HCT 41.5 09/17/2012 2231   PLT 426 (H) 05/31/2024 0818   PLT 186 09/17/2012 2231    MCV 93.4 05/31/2024 0818   MCV 92 09/17/2012 2231   NEUTROABS 2.7 05/31/2024 0818   NEUTROABS 6.4 09/17/2012 2231   LYMPHSABS 0.7 05/31/2024 0818   LYMPHSABS 0.9 (L) 09/17/2012 2231   MONOABS 0.7 05/31/2024 0818   MONOABS 0.5 09/17/2012 2231   EOSABS 0.0 05/31/2024 0818   EOSABS 0.0 09/17/2012 2231   BASOSABS 0.1 05/31/2024 0818   BASOSABS 0.0 09/17/2012 2231   Comprehensive Metabolic Panel:    Component Value Date/Time   NA 136 05/31/2024 0818   NA 139 09/17/2012 2231  K 3.4 (L) 05/31/2024 0818   K 4.0 09/17/2012 2231   CL 101 05/31/2024 0818   CL 106 09/17/2012 2231   CO2 25 05/31/2024 0818   CO2 29 09/17/2012 2231   BUN 11 05/31/2024 0818   BUN 13 09/17/2012 2231   CREATININE 0.57 05/31/2024 0818   CREATININE 0.94 09/17/2012 2231   GLUCOSE 138 (H) 05/31/2024 0818   GLUCOSE 131 (H) 09/17/2012 2231   CALCIUM 8.9 05/31/2024 0818   CALCIUM 8.9 09/17/2012 2231   AST 21 05/31/2024 0818   ALT 14 05/31/2024 0818   ALT 23 09/17/2012 2231   ALKPHOS 49 05/31/2024 0818   ALKPHOS 98 09/17/2012 2231   BILITOT 0.5 05/31/2024 0818   PROT 6.9 05/31/2024 0818   PROT 8.1 09/17/2012 2231   ALBUMIN 3.7 05/31/2024 0818   ALBUMIN 3.8 09/17/2012 2231    RADIOGRAPHIC STUDIES: US  Venous Img Lower Bilateral (DVT) Result Date: 05/19/2024 CLINICAL DATA:  Lower extremity pain. EXAM: BILATERAL LOWER EXTREMITY VENOUS DOPPLER ULTRASOUND TECHNIQUE: Gray-scale sonography with graded compression, as well as color Doppler and duplex ultrasound were performed to evaluate the lower extremity deep venous systems from the level of the common femoral vein and including the common femoral, femoral, profunda femoral, popliteal and calf veins including the posterior tibial, peroneal and gastrocnemius veins when visible. The superficial great saphenous vein was also interrogated. Spectral Doppler was utilized to evaluate flow at rest and with distal augmentation maneuvers in the common femoral, femoral and  popliteal veins. COMPARISON:  None Available. FINDINGS: RIGHT LOWER EXTREMITY Common Femoral Vein: No evidence of thrombus. Normal compressibility, respiratory phasicity and response to augmentation. Saphenofemoral Junction: No evidence of thrombus. Normal compressibility and flow on color Doppler imaging. Profunda Femoral Vein: No evidence of thrombus. Normal compressibility and flow on color Doppler imaging. Femoral Vein: No evidence of thrombus. Normal compressibility, respiratory phasicity and response to augmentation. Popliteal Vein: No evidence of thrombus. Normal compressibility, respiratory phasicity and response to augmentation. Calf Veins: No evidence of thrombus. Normal compressibility and flow on color Doppler imaging. Superficial Great Saphenous Vein: No evidence of thrombus. Normal compressibility. Venous Reflux:  None. Other Findings: No evidence of superficial thrombophlebitis or abnormal fluid collection. LEFT LOWER EXTREMITY Common Femoral Vein: No evidence of thrombus. Normal compressibility, respiratory phasicity and response to augmentation. Saphenofemoral Junction: No evidence of thrombus. Normal compressibility and flow on color Doppler imaging. Profunda Femoral Vein: No evidence of thrombus. Normal compressibility and flow on color Doppler imaging. Femoral Vein: No evidence of thrombus. Normal compressibility, respiratory phasicity and response to augmentation. Popliteal Vein: No evidence of thrombus. Normal compressibility, respiratory phasicity and response to augmentation. Calf Veins: No evidence of thrombus. Normal compressibility and flow on color Doppler imaging. Superficial Great Saphenous Vein: No evidence of thrombus. Normal compressibility. Venous Reflux:  None. Other Findings: No evidence of superficial thrombophlebitis or abnormal fluid collection. IMPRESSION: No evidence of deep venous thrombosis in either lower extremity. Electronically Signed   By: Marcey Moan M.D.   On:  05/19/2024 17:20   CT Angio Chest Pulmonary Embolism (PE) W or WO Contrast Result Date: 05/18/2024 CLINICAL DATA:  Concern for pulmonary embolism.  Lymphoma. EXAM: CT ANGIOGRAPHY CHEST WITH CONTRAST TECHNIQUE: Multidetector CT imaging of the chest was performed using the standard protocol during bolus administration of intravenous contrast. Multiplanar CT image reconstructions and MIPs were obtained to evaluate the vascular anatomy. RADIATION DOSE REDUCTION: This exam was performed according to the departmental dose-optimization program which includes automated exposure control, adjustment of the mA  and/or kV according to patient size and/or use of iterative reconstruction technique. CONTRAST:  50mL OMNIPAQUE  IOHEXOL  350 MG/ML SOLN COMPARISON:  Chest radiograph dated 05/17/2024. FINDINGS: Cardiovascular: There is no cardiomegaly or pericardial effusion. The thoracic aorta is unremarkable. The origins of the great vessels of the aortic arch are patent. Right-sided Port-A-Cath with tip in the right atrium at the cavoatrial junction. Linear nonocclusive right lower lobe subsegmental pulmonary artery embolus, age indeterminate. No CT evidence of right heart straining. Mediastinum/Nodes: No hilar or mediastinal adenopathy. The esophagus and the thyroid gland are grossly unremarkable no mediastinal fluid collection. Lungs/Pleura: Minimal bibasilar linear atelectasis. No focal consolidation, pleural effusion, or pneumothorax. The central airways are patent. Upper Abdomen: No acute abnormality. Musculoskeletal: Degenerative changes of spine. No acute osseous pathology. Bilateral shoulder arthroplasties. Review of the MIP images confirms the above findings. IMPRESSION: 1. Age indeterminate linear nonocclusive right lower lobe subsegmental pulmonary artery embolus. No CT evidence of right heart straining. 2. No focal consolidation. These results will be called to the ordering clinician or representative by the Radiologist  Assistant, and communication documented in the PACS or [employer]. Electronically Signed   By: Vanetta Chou M.D.   On: 05/18/2024 14:56   CT ABDOMEN PELVIS W CONTRAST Result Date: 05/17/2024 CLINICAL DATA:  Abdominal pain.  Concern for kidney stone. EXAM: CT ABDOMEN AND PELVIS WITH CONTRAST TECHNIQUE: Multidetector CT imaging of the abdomen and pelvis was performed using the standard protocol following bolus administration of intravenous contrast. RADIATION DOSE REDUCTION: This exam was performed according to the departmental dose-optimization program which includes automated exposure control, adjustment of the mA and/or kV according to patient size and/or use of iterative reconstruction technique. CONTRAST:  OMNIPAQUE  IOHEXOL  300 MG/ML  SOLN COMPARISON:  CT abdomen pelvis dated 04/08/2024. FINDINGS: Lower chest: The visualized lung bases are clear. Tiny nonocclusive embolus in the right lower lobe subsegmental branch (2/2). Chest CT may provide better evaluation. No intra-abdominal free air or free fluid. Hepatobiliary: The liver is unremarkable. No biliary dilatation. The gallbladder is unremarkable. Pancreas: Unremarkable. No pancreatic ductal dilatation or surrounding inflammatory changes. Spleen: Normal in size without focal abnormality. Adrenals/Urinary Tract: The adrenal glands unremarkable. Left ureteral stent with proximal tip in the inferior pole collecting system and distal end in the urinary bladder. There is mild left hydronephrosis. There is urothelial enhancement of the left renal collecting system and ureter suggestive of ascending UTI. No stone identified. The right kidney, right ureter, and urinary bladder appear unremarkable. Stomach/Bowel: Moderate stool throughout the colon. There is no bowel obstruction or active inflammation. Appendectomy. Vascular/Lymphatic: The abdominal aorta and IVC unremarkable. No portal venous gas. There is no adenopathy. Reproductive: The uterus is  grossly unremarkable. Other: Interval decrease in the masslike soft tissues thickening of the left pelvic sidewall measuring approximately 5.8 x 3.3 cm in greatest axial dimensions (previously 6.7 x 4.8 cm). A 1.4 x 1.2 cm rim enhancing low attenuating focus within this soft tissue mass (coronal 52/5) suspicious for an area of phlegmonous change or developing abscess. Musculoskeletal: Degenerative changes of the spine. No acute osseous pathology. IMPRESSION: 1. Left ureteral stent with mild left hydronephrosis and findings suggestive of ascending UTI. Correlation with urinalysis recommended. 2. Interval decrease in the masslike soft tissues thickening of the left pelvic sidewall. A 1.4 x 1.2 cm rim enhancing low attenuating focus within this soft tissue mass suspicious for an area of phlegmonous change or developing abscess. 3. Tiny nonocclusive embolus in the right lower lobe subsegmental branch. Chest CT  may provide better evaluation. These results were called by telephone at the time of interpretation on 05/17/2024 at 6:28 pm to provider LAMAR PRICE , who verbally acknowledged these results. Electronically Signed   By: Vanetta Chou M.D.   On: 05/17/2024 18:39   DG Chest Port 1 View Result Date: 05/17/2024 CLINICAL DATA:  Sepsis. EXAM: PORTABLE CHEST 1 VIEW COMPARISON:  Chest radiograph dated 09/17/2012 FINDINGS: Right-sided PICC with tip over central SVC. No focal consolidation, pleural effusion, pneumothorax. The cardiac silhouette is within limits. No acute osseous pathology. Bilateral shoulder arthroplasties. IMPRESSION: No active disease. Electronically Signed   By: Vanetta Chou M.D.   On: 05/17/2024 16:38   IR IMAGING GUIDED PORT INSERTION Result Date: 05/01/2024 CLINICAL DATA:  Large B-cell lymphoma of the pelvis and need for porta cath to begin chemotherapy. EXAM: IMPLANTED PORT A CATH PLACEMENT WITH ULTRASOUND AND FLUOROSCOPIC GUIDANCE ANESTHESIA/SEDATION: Moderate (conscious) sedation was  employed during this procedure. A total of Versed  1.0 mg and Fentanyl  50 mcg was administered intravenously. Moderate Sedation Time: 28 minutes. The patient's level of consciousness and vital signs were monitored continuously by radiology nursing throughout the procedure under my direct supervision. FLUOROSCOPY: Radiation Exposure Index: 1.9 mGy Kerma PROCEDURE: The procedure, risks, benefits, and alternatives were explained to the patient. Questions regarding the procedure were encouraged and answered. The patient understands and consents to the procedure. A time-out was performed prior to initiating the procedure. Ultrasound was utilized to confirm patency of the right internal jugular vein. An ultrasound image was saved and recorded. The right neck and chest were prepped with chlorhexidine  in a sterile fashion, and a sterile drape was applied covering the operative field. Maximum barrier sterile technique with sterile gowns and gloves were used for the procedure. Local anesthesia was provided with 1% lidocaine . After creating a small venotomy incision, a 21 gauge needle was advanced into the right internal jugular vein under direct, real-time ultrasound guidance. Ultrasound image documentation was performed. After securing guidewire access, an 8 Fr dilator was placed. A J-wire was kinked to measure appropriate catheter length. A subcutaneous port pocket was then created along the upper chest wall utilizing sharp and blunt dissection. Portable cautery was utilized. The pocket was irrigated with sterile saline. A single lumen power injectable port was chosen for placement. The 8 Fr catheter was tunneled from the port pocket site to the venotomy incision. The port was placed in the pocket. External catheter was trimmed to appropriate length based on guidewire measurement. At the venotomy, an 8 Fr peel-away sheath was placed over a guidewire. The catheter was then placed through the sheath and the sheath removed.  Final catheter positioning was confirmed and documented with a fluoroscopic spot image. The port was accessed with a needle and aspirated and flushed with heparinized saline. The access needle was removed. The venotomy and port pocket incisions were closed with subcutaneous 3-0 Monocryl and subcuticular 4-0 Vicryl. Dermabond was applied to both incisions. COMPLICATIONS: COMPLICATIONS None FINDINGS: After catheter placement, the tip lies at the cavo-atrial junction. The catheter aspirates normally and is ready for immediate use. IMPRESSION: Placement of single lumen port a cath via right internal jugular vein. The catheter tip lies at the cavo-atrial junction. A power injectable port a cath was placed and is ready for immediate use. Electronically Signed   By: Marcey Moan M.D.   On: 05/01/2024 11:53   CT BONE MARROW BIOPSY & ASPIRATION Result Date: 05/01/2024 CLINICAL DATA:  Large B-cell lymphoma of pelvic sidewall and need  for bone marrow biopsy. EXAM: CT GUIDED BONE MARROW ASPIRATION AND BIOPSY ANESTHESIA/SEDATION: Moderate (conscious) sedation was employed during this procedure. A total of Versed  2.0 mg and Fentanyl  100 mcg was administered intravenously. Moderate Sedation Time: 10 minutes. The patient's level of consciousness and vital signs were monitored continuously by radiology nursing throughout the procedure under my direct supervision. PROCEDURE: The procedure risks, benefits, and alternatives were explained to the patient. Questions regarding the procedure were encouraged and answered. The patient understands and consents to the procedure. A time out was performed prior to initiating the procedure. The right gluteal region was prepped with chlorhexidine . Sterile gown and sterile gloves were used for the procedure. Local anesthesia was provided with 1% Lidocaine . Under CT guidance, an 11 gauge On Control bone cutting needle was advanced from a posterior approach into the right iliac bone. Needle  positioning was confirmed with CT. Initial non heparinized and heparinized aspirate samples were obtained of bone marrow. Core biopsy was performed via the On Control drill needle. COMPLICATIONS: None FINDINGS: Inspection of initial aspirate did reveal visible particles. Intact core biopsy sample was obtained. IMPRESSION: CT guided bone marrow biopsy of right posterior iliac bone with both aspirate and core samples obtained. Electronically Signed   By: Marcey Moan M.D.   On: 05/01/2024 10:41    PERFORMANCE STATUS (ECOG) : 2 - Symptomatic, <50% confined to bed  Review of Systems Unless otherwise noted, a complete review of systems is negative.  Physical Exam General: NAD Pulmonary: clear ant fields Abdomen: soft, nontender, + bowel sounds GU: no suprapubic tenderness Extremities: no edema, no joint deformities Skin: no rashes Neurological: Weakness, lateral numbness left leg and ankle  IMPRESSION: Patient with diffuse large B-cell lymphoma status post cycle 1 R-CHOP.  Patient has had severe sciatic pain and neuropathic pain to the left leg.  She was started on gabapentin , with dosing increased to 400 mg 3 times daily.  Patient also on fentanyl  patch 25 mcg every 72 hours and oxycodone  5 mg every 4 hours as needed.  Also on Robaxin .  Patient describes severe numbness/pain in the left extremity, which starts in the left buttocks and radiates down the leg.  Pain is most severe at the ankle and foot and is limiting her mobility.  Pain is keeping patient awake at night.  She says that she is taking oxycodone  (often 2 tablets or 10 mg) every 4 hours even throughout the night.  She has not found any improvement with dose escalation of gabapentin .   Certainly, symptoms could be chemo-induced peripheral neuropathy.  However, we will obtain MRI of the lumbar spine for further evaluation.  Will start patient on duloxetine and refill oxycodone .  Patient sees neuro oncology later this  week.  PLAN: -Continue current scope of treatment - Continue fentanyl  and refill oxycodone  - PDMP reviewed - Daily bowel regimen to prevent opioid-induced constipation - Continue gabapentin  - Start duloxetine 30 mg daily - MRI lumbar spine - RTC 2 weeks  Case and plan discussed with Dr. Babara  Patient expressed understanding and was in agreement with this plan. She also understands that She can call the clinic at any time with any questions, concerns, or complaints.     Time Total: 20 minutes  Visit consisted of counseling and education dealing with the complex and emotionally intense issues of symptom management and palliative care in the setting of serious and potentially life-threatening illness.Greater than 50%  of this time was spent counseling and coordinating care related to the  above assessment and plan.  Signed by: Fonda Mower, PhD, NP-C

## 2024-05-31 NOTE — Assessment & Plan Note (Signed)
 DLBCL of left pelvic wall mass, Atypical t (8; 14), BCL2 BCL6 monosomy.  Negative for BCL2, BCL6 and MYC rearrangement PET scan evaluation showed mild hypermetabolism in spleen.  Nonspecific.  Likely stage I/II disease. Bone marrow biopsy is negative.   IPI score 1-2 Baseline Echo showed LVEF 55-60% Grade I diastolic dysfunction   Recommend systemic chemotherapy R CHOP   Labs are reviewed and discussed with patient. Proceed with cycle 2 R dose reduced CHP with D3 GCSF.  Hold Vincristine due to neurology.  She will take  D2-D5 prednisone  100mg  daily.  Repeat PET after this cycle of treatment.   Recommend Allopurinol 300mg  daily. Acyclovir 400mg  daily for prophylaxis.

## 2024-05-31 NOTE — Assessment & Plan Note (Addendum)
 Sciatic pain/neuropathic pain.   Pre existing symptoms prior to chemotherapy. Left hip/buttock pain has improved after chemo, however worse of calf and foot pain is worse. Suspect that she has radiculopathy. Acute chemotherapy neuro toxicity is possible, unilateral symptom favors radiculopathy.  Obtain MRI lumbar for evaluation.  Hold off Vincristine  Gabapnetin 400mg  TID Refer to Neurology

## 2024-05-31 NOTE — Assessment & Plan Note (Signed)
Eliquis 5 mg BID

## 2024-06-01 ENCOUNTER — Other Ambulatory Visit: Payer: Self-pay

## 2024-06-01 ENCOUNTER — Telehealth: Payer: Self-pay

## 2024-06-01 NOTE — Telephone Encounter (Signed)
 Patient states Dr. Babara advised her to stop one of her medications during visit yesterday but she can't recall which one.  What med should pt d/c?

## 2024-06-02 ENCOUNTER — Other Ambulatory Visit: Payer: Self-pay | Admitting: Oncology

## 2024-06-02 ENCOUNTER — Other Ambulatory Visit: Payer: Self-pay

## 2024-06-02 ENCOUNTER — Inpatient Hospital Stay: Payer: Self-pay

## 2024-06-02 ENCOUNTER — Inpatient Hospital Stay

## 2024-06-02 ENCOUNTER — Encounter: Payer: Self-pay | Admitting: Internal Medicine

## 2024-06-02 ENCOUNTER — Inpatient Hospital Stay (HOSPITAL_BASED_OUTPATIENT_CLINIC_OR_DEPARTMENT_OTHER): Admitting: Hospice and Palliative Medicine

## 2024-06-02 ENCOUNTER — Inpatient Hospital Stay: Admitting: Internal Medicine

## 2024-06-02 VITALS — BP 109/76 | HR 73 | Temp 98.6°F | Resp 16 | Wt 167.0 lb

## 2024-06-02 VITALS — BP 128/76 | HR 67 | Resp 16

## 2024-06-02 DIAGNOSIS — T451X5A Adverse effect of antineoplastic and immunosuppressive drugs, initial encounter: Secondary | ICD-10-CM | POA: Diagnosis not present

## 2024-06-02 DIAGNOSIS — R29898 Other symptoms and signs involving the musculoskeletal system: Secondary | ICD-10-CM

## 2024-06-02 DIAGNOSIS — G620 Drug-induced polyneuropathy: Secondary | ICD-10-CM

## 2024-06-02 DIAGNOSIS — C8330 Diffuse large B-cell lymphoma, unspecified site: Secondary | ICD-10-CM

## 2024-06-02 DIAGNOSIS — Z5112 Encounter for antineoplastic immunotherapy: Secondary | ICD-10-CM | POA: Diagnosis not present

## 2024-06-02 LAB — CBC WITH DIFFERENTIAL (CANCER CENTER ONLY)
Abs Immature Granulocytes: 0.04 K/uL (ref 0.00–0.07)
Basophils Absolute: 0 K/uL (ref 0.0–0.1)
Basophils Relative: 0 %
Eosinophils Absolute: 0 K/uL (ref 0.0–0.5)
Eosinophils Relative: 0 %
HCT: 30.5 % — ABNORMAL LOW (ref 36.0–46.0)
Hemoglobin: 10.4 g/dL — ABNORMAL LOW (ref 12.0–15.0)
Immature Granulocytes: 1 %
Lymphocytes Relative: 5 %
Lymphs Abs: 0.3 K/uL — ABNORMAL LOW (ref 0.7–4.0)
MCH: 31.7 pg (ref 26.0–34.0)
MCHC: 34.1 g/dL (ref 30.0–36.0)
MCV: 93 fL (ref 80.0–100.0)
Monocytes Absolute: 0.4 K/uL (ref 0.1–1.0)
Monocytes Relative: 5 %
Neutro Abs: 5.8 K/uL (ref 1.7–7.7)
Neutrophils Relative %: 89 %
Platelet Count: 392 K/uL (ref 150–400)
RBC: 3.28 MIL/uL — ABNORMAL LOW (ref 3.87–5.11)
RDW: 16 % — ABNORMAL HIGH (ref 11.5–15.5)
WBC Count: 6.5 K/uL (ref 4.0–10.5)
nRBC: 0 % (ref 0.0–0.2)

## 2024-06-02 LAB — CMP (CANCER CENTER ONLY)
ALT: 11 U/L (ref 0–44)
AST: 15 U/L (ref 15–41)
Albumin: 3.5 g/dL (ref 3.5–5.0)
Alkaline Phosphatase: 41 U/L (ref 38–126)
Anion gap: 10 (ref 5–15)
BUN: 24 mg/dL — ABNORMAL HIGH (ref 8–23)
CO2: 23 mmol/L (ref 22–32)
Calcium: 8.7 mg/dL — ABNORMAL LOW (ref 8.9–10.3)
Chloride: 101 mmol/L (ref 98–111)
Creatinine: 0.59 mg/dL (ref 0.44–1.00)
GFR, Estimated: >60 mL/min (ref >60)
Glucose, Bld: 139 mg/dL — ABNORMAL HIGH (ref 70–99)
Potassium: 3.6 mmol/L (ref 3.5–5.1)
Sodium: 134 mmol/L — ABNORMAL LOW (ref 135–145)
Total Bilirubin: 0.5 mg/dL (ref 0.0–1.2)
Total Protein: 6.4 g/dL — ABNORMAL LOW (ref 6.5–8.1)

## 2024-06-02 MED ORDER — PREGABALIN 75 MG PO CAPS
75.0000 mg | ORAL_CAPSULE | Freq: Two times a day (BID) | ORAL | 2 refills | Status: AC
  Filled 2024-06-02: qty 60, 30d supply, fill #0
  Filled 2024-07-02: qty 60, 30d supply, fill #1
  Filled 2024-08-06: qty 60, 30d supply, fill #2

## 2024-06-02 MED ORDER — PEGFILGRASTIM-JMDB 6 MG/0.6ML ~~LOC~~ SOSY
6.0000 mg | PREFILLED_SYRINGE | Freq: Once | SUBCUTANEOUS | Status: AC
  Administered 2024-06-02: 6 mg via SUBCUTANEOUS
  Filled 2024-06-02: qty 0.6

## 2024-06-02 MED ORDER — SODIUM CHLORIDE 0.9 % IV SOLN
INTRAVENOUS | Status: DC
  Filled 2024-06-02 (×2): qty 250

## 2024-06-02 NOTE — Progress Notes (Addendum)
 Symptom Management Clinic [facility] at [facility] [phone] [phone]) [phone]  Patient Care Team: [name], DEVONNA as PCP - General (Physician Assistant) Babara Call, MD as Consulting Physician (Oncology)   NAME OF PATIENT: Stacey [name]  [id number]  1961-05-09   DATE OF VISIT: 06/02/24  REASON FOR CONSULT: Stacey [name] is a 63 y.o. female with multiple medical problems including diffuse large B-cell lymphoma on chemotherapy.  INTERVAL HISTORY: Patient status post day 1, cycle 2 R-CHOP on 05/31/2024.  Has had poor tolerance of chemotherapy with severe peripheral neuropathy, weakness, poor oral intake.  She presents to symptom management today for evaluation of ongoing supportive care needs.  Continues to endorse poor oral intake.  Eating and drinking minimally.  Has intermittent nausea without vomiting.  No fever or chills.  Denies any neurologic complaints. Denies recent fevers or illnesses. Denies any easy bleeding or bruising. Denies chest pain. Denies any constipation, or diarrhea. Denies urinary complaints. Patient offers no further specific complaints today.  PAST MEDICAL HISTORY: Past Medical History:  Diagnosis Date   Arthritis    left shoulder, neck, lower back  (01/11/2018)   Chronic lower back pain    Family history of adverse reaction to anesthesia    Mother has nausea   GERD (gastroesophageal reflux disease)    PONV (postoperative nausea and vomiting)    Pre-diabetes    UTI (urinary tract infection) 03/20/2021    PAST SURGICAL HISTORY:  Past Surgical History:  Procedure Laterality Date   APPENDECTOMY     CYSTOSCOPY W/ URETERAL STENT PLACEMENT Left 04/08/2024   Procedure: CYSTOSCOPY, WITH RETROGRADE PYELOGRAM AND URETERAL STENT INSERTION;  Surgeon: Watt Rush, MD;  Location: [facility] ORS;  Service: Urology;  Laterality: Left;   IR IMAGING GUIDED PORT INSERTION  05/01/2024   JOINT REPLACEMENT     TOTAL SHOULDER  ARTHROPLASTY Left 01/11/2018   TOTAL SHOULDER ARTHROPLASTY Left 01/11/2018   Procedure: LEFT TOTAL SHOULDER ARTHROPLASTY;  Surgeon: Sharl Selinda Dover, MD;  Location: [facility] OR;  Service: Orthopedics;  Laterality: Left;  2.5 hrs   TOTAL SHOULDER ARTHROPLASTY Right 02/08/2020   Procedure: TOTAL SHOULDER ARTHROPLASTY;  Surgeon: Sharl Selinda Dover, MD;  Location: [facility] OR;  Service: Orthopedics;  Laterality: Right;  2.5 hrs RNFA    HEMATOLOGY/ONCOLOGY HISTORY:  Oncology History  Diffuse large B-cell lymphoma (HCC)  04/08/2024 Imaging   CT renal stone study showed  1. Mild left hydroureteronephrosis with periureteral/peripelvic stranding, no urinary calculi. 2. Imaging findings are compatible with recently passed left ureteral stone versus acute left pyelonephritis.   04/08/2024 Imaging   CT angiogram abdomen/pelvis with and without contrast  1. Obstructing 5.9 x 4.5 cm enhancing mass along the left pelvic sidewall causing left hydroureter, hydronephrosis, delayed nephrogram, and nephromegaly; mass abuts the left adnexa with loss of the intervening fat plane. This is highly concerning for malignant neoplasm. Etiology indeterminate. It's possible that this could be arising from a primary urothelial neoplasm of the left ureter. Other primary neoplasms or metastatic disease not excluded. Correlation with tissue sampling is advised. 2. Patent abdominal vasculature without signs of mesenteric ischemia.   04/24/2024 Imaging   PET scan showed  1. The posterior left pelvic sidewall mass is hypermetabolic, consistent with active lymphoma. 2. Mild splenic hypermetabolism relative to the liver. No splenomegaly. Cannot exclude lymphomatous involvement. 3. New small right pleural effusion. 4. No active lymphoma within the chest or neck. Mildly decreased sensitivity exam secondary to hypermetabolic brown fat. 5. Age advanced coronary artery atherosclerosis.  Recommend assessment of coronary risk  factors. 6.  Aortic Atherosclerosis    04/26/2024 Initial Diagnosis   Lymphoma (HCC)  Symptoms began on 03/10/2024 with pain radiating down the left leg, associated with the sciatic nerve. Initially managed with high doses of ibuprofen, which provided some relief, but the pain worsened within a week, leading to an emergency room visit where she received a steroid injection in her lower back.  04/08/2024 she returned to the hospital due to increased pain. A CT scan revealed a pelvic mass obstructing the ureter, and a stent was placed by a urologist.  Patient underwent biopsy of pelvis mass Pathology showed 1. Lymph node, needle/core biopsy, 18G cores infilatratic left pelvic sidewall mass :       LARGE B-CELL LYMPHOMA.  soft      tissue with atypical lymphoid infiltrate comprised predominantly of medium to      large sized cells.  Abundant mitotic figures are present.  Focal areas of      necrosis are noted.  Immunohistochemical stains reveal the malignant cells are      positive for CD20, PAX5, BCL6 (subset), MUM1, Bcl-2 and are negative for CD10      and cyclin D1.  CD30 does not show any significant expression. CD15 highlights      neutrophils. The Ki-67 proliferation index is high approximately 70%.  This lymphoma has a activated B-cell immunophenotype.   EBER-ish is pending and will be reported in an addendum.  Also,High-grade B-cell lymphoma FISH panel will be   ordered to exclude a double/triple hit lymphoma and results will be reported in an addendum   She has experienced unintentional weight loss of 18 pounds since May, without any changes in diet or exercise. No night sweats, low-grade fever, or excessive sweating at night. Baseline health includes chronic back pain, acid reflux, prediabetes, and constipation.  She is taking gabapentin  100 mg once daily for nerve pain, described as sometimes dull and sometimes sharp. Oxycodone  5 mg every six hours for pain management, which helps  reduce the pain slightly.  She notes reduced appetite, numbness, and tingling in her leg. Also mentions leg swelling, particularly at night when less active.   04/26/2024 Cancer Staging   Staging form: Hodgkin and Non-Hodgkin Lymphoma, AJCC 8th Edition - Clinical stage from 04/26/2024: Stage II (Diffuse large B-cell lymphoma) - Signed by Babara Call, MD on 04/26/2024 Stage prefix: Initial diagnosis   05/01/2024 Bone Marrow Biopsy   Bone marrow biopsy results showed  BONE MARROW, ASPIRATE, CLOT, CORE:  - Mildly hypocellular bone marrow with otherwise orderly trilineage hematopoiesis  - No morphologic or immunophenotypic evidence of the patient's recently diagnosed large B-cell lymphoma    05/10/2024 -  Chemotherapy   Patient is on Treatment Plan : NON-HODGKINS LYMPHOMA R-CHOP q21d x 4 cycles       ALLERGIES:  is allergic to propofol .  MEDICATIONS:  Current Outpatient Medications  Medication Sig Dispense Refill   acyclovir  (ZOVIRAX ) 400 MG tablet Take 1 tablet (400 mg total) by mouth daily. 30 tablet 3   allopurinol  (ZYLOPRIM ) 300 MG tablet Take 1 tablet (300 mg total) by mouth daily. 30 tablet 3   amoxicillin  (AMOXIL ) 500 MG capsule Take 2 capsules (1,000 mg total) by mouth 3 (three) times daily for 3 days. 18 capsule 0   apixaban  (ELIQUIS ) 5 MG TABS tablet Take 2 tablets (10 mg total) by mouth 2 (two) times daily for 3 days, THEN 1 tablet (5 mg total) 2 (two) times daily.  60 tablet 2   DULoxetine  (CYMBALTA ) 30 MG capsule Take 1 capsule (30 mg total) by mouth daily. 30 capsule 3   fentaNYL  (DURAGESIC ) 25 MCG/HR Place 1 patch onto the skin every 3 (three) days. 5 patch 0   lidocaine -prilocaine  (EMLA ) cream Apply to affected area once 30 g 3   loratadine  (CLARITIN ) 10 MG tablet Take 10 mg by mouth at bedtime.     magic mouthwash (multi-ingredient) oral suspension Swish and swallow 5-10 mLs 4 (four) times daily as needed for mouth pain. 480 mL 1   methocarbamol  (ROBAXIN ) 500 MG tablet Take  500 mg by mouth every 8 (eight) hours as needed for muscle spasms.  0   omeprazole  (PRILOSEC) 20 MG capsule TAKE 1 CAPSULE (20 MG TOTAL) BY MOUTH 2 (TWO) TIMES DAILY BEFORE A MEAL. 180 capsule 0   ondansetron  (ZOFRAN ) 8 MG tablet Take 1 tablet (8 mg total) by mouth every 8 (eight) hours as needed for nausea or vomiting. Start on the third day after cyclophosphamide  chemotherapy. 30 tablet 1   oxybutynin  (DITROPAN -XL) 10 MG 24 hr tablet TAKE 1 TABLET BY MOUTH EVERY DAY 30 tablet 0   Oxycodone  HCl 10 MG TABS Take 1 tablet (10 mg total) by mouth every 4 (four) hours as needed (pain). 60 tablet 0   potassium chloride  SA (KLOR-CON  M) 20 MEQ tablet Take 1 tablet (20 mEq total) by mouth daily. 7 tablet 0   predniSONE  (DELTASONE ) 20 MG tablet Take 5 tablets (100 mg total) by mouth as directed. Take with food on days 2-5 of chemotherapy. 20 tablet 4   pregabalin  (LYRICA ) 75 MG capsule Take 1 capsule (75 mg total) by mouth 2 (two) times daily. 60 capsule 2   prochlorperazine  (COMPAZINE ) 10 MG tablet Take 1 tablet (10 mg total) by mouth every 6 (six) hours as needed for nausea or vomiting. 30 tablet 6   sodium phosphate  (FLEET) ENEM Place 133 mLs (1 enema total) rectally daily as needed for severe constipation. (Patient not taking: Reported on 06/02/2024) 3 mL 0   tamsulosin  (FLOMAX ) 0.4 MG CAPS capsule TAKE 1 CAPSULE BY MOUTH EVERY DAY 30 capsule 0   traZODone  (DESYREL ) 50 MG tablet Take 1 tablet (50 mg total) by mouth at bedtime as needed for sleep. 30 tablet 3   No current facility-administered medications for this visit.   Facility-Administered Medications Ordered in Other Visits  Medication Dose Route Frequency Provider Last Rate Last Admin   0.9 %  sodium chloride  infusion   Intravenous Continuous Sheriff Rodenberg, Fonda SAUNDERS, NP 999 mL/hr at 06/02/24 1124 New Bag at 06/02/24 1124    VITAL SIGNS: There were no vitals taken for this visit. There were no vitals filed for this visit.  Estimated body mass index  is 28.67 kg/m as calculated from the following:   Height as of 05/17/24: 5' 4 (1.626 m).   Weight as of an earlier encounter on 06/02/24: 167 lb (75.8 kg).  LABS: CBC:    Component Value Date/Time   WBC 6.5 06/02/2024 1059   WBC 2.9 (L) 05/24/2024 0420   HGB 10.4 (L) 06/02/2024 1059   HGB 14.5 09/17/2012 2231   HCT 30.5 (L) 06/02/2024 1059   HCT 41.5 09/17/2012 2231   PLT 392 06/02/2024 1059   PLT 186 09/17/2012 2231   MCV 93.0 06/02/2024 1059   MCV 92 09/17/2012 2231   NEUTROABS 5.8 06/02/2024 1059   NEUTROABS 6.4 09/17/2012 2231   LYMPHSABS 0.3 (L) 06/02/2024 1059   LYMPHSABS 0.9 (  L) 09/17/2012 2231   MONOABS 0.4 06/02/2024 1059   MONOABS 0.5 09/17/2012 2231   EOSABS 0.0 06/02/2024 1059   EOSABS 0.0 09/17/2012 2231   BASOSABS 0.0 06/02/2024 1059   BASOSABS 0.0 09/17/2012 2231   Comprehensive Metabolic Panel:    Component Value Date/Time   NA 134 (L) 06/02/2024 1059   NA 139 09/17/2012 2231   K 3.6 06/02/2024 1059   K 4.0 09/17/2012 2231   CL 101 06/02/2024 1059   CL 106 09/17/2012 2231   CO2 23 06/02/2024 1059   CO2 29 09/17/2012 2231   BUN 24 (H) 06/02/2024 1059   BUN 13 09/17/2012 2231   CREATININE 0.59 06/02/2024 1059   CREATININE 0.94 09/17/2012 2231   GLUCOSE 139 (H) 06/02/2024 1059   GLUCOSE 131 (H) 09/17/2012 2231   CALCIUM 8.7 (L) 06/02/2024 1059   CALCIUM 8.9 09/17/2012 2231   AST 15 06/02/2024 1059   ALT 11 06/02/2024 1059   ALT 23 09/17/2012 2231   ALKPHOS 41 06/02/2024 1059   ALKPHOS 98 09/17/2012 2231   BILITOT 0.5 06/02/2024 1059   PROT 6.4 (L) 06/02/2024 1059   PROT 8.1 09/17/2012 2231   ALBUMIN 3.5 06/02/2024 1059   ALBUMIN 3.8 09/17/2012 2231    RADIOGRAPHIC STUDIES: MR Lumbar Spine W Wo Contrast Result Date: 05/31/2024 EXAM: MRI LUMBAR SPINE 05/31/2024 03:19:00 PM TECHNIQUE: Multiplanar multisequence MRI of the lumbar spine was performed with and without the administration of intravenous contrast. 7 mL (gadobutrol  (GADAVIST ) 1  MMOL/ML injection 7 mL GADOBUTROL  1 MMOL/ML IV SOLN). COMPARISON: CT abdomen and pelvis 05/17/2024. CLINICAL HISTORY: Metastatic disease evaluation; low back pain with numbness down left leg. History of lymphoma. FINDINGS: BONES AND ALIGNMENT: 5 lumbar type vertebrae. Mild lumbar dextroscoliosis. Grade 1 retrolisthesis of L1-L2 and L2-L3 and grade 1 anterolisthesis of L4-L5. No fracture or suspicious marrow lesion. Mild bilateral facet edema at L4-L5. SPINAL CORD: The conus medullaris terminates at L1-L2 and is normal in signal. No evidence of epidural tumor. SOFT TISSUES: No paraspinal mass. INCIDENTAL FINDINGS: Mild left hydronephrosis, similar to the prior CT. DISC LEVELS: Disc desiccation primarily from L1-L2 through L4-L5. Severe disc space narrowing at L1-L2 and mild to moderate narrowing at L2-L3 and L3-L4. T12-L1: A broad central disc protrusion results in mild spinal stenosis. Patent neural foramina. L1-L2: Disc bulging and mild facet hypertrophy without significant stenosis. L2-L3: Circumferential disc bulging mildly eccentric to the left and moderate to severe facet hypertrophy result in mild left lateral recess stenosis and mild bilateral neural foraminal stenosis without spinal stenosis. L3-L4: Disc bulging, prominent epidural fat, and moderate to severe facet hypertrophy result in mild to moderate spinal stenosis, mild right greater than left lateral recess stenosis, and mild bilateral neural foraminal stenosis. L4-L5: Anterolisthesis with mild bulging of uncovered disc and severe facet hypertrophy result in mild bilateral lateral recess stenosis and mild bilateral neural foraminal stenosis without significant spinal stenosis. L5-S1: Severe facet hypertrophy without disc herniation or stenosis. IMPRESSION: 1. No evidence of metastatic disease in the lumbar spine. 2. Diffuse disc and facet degeneration with mild to moderate spinal stenosis at L3-L4. 3. Mild lateral recess and neural foraminal stenosis  at L2-3 and L4-5. 4. Mild left hydronephrosis, similar to prior CT. Electronically signed by: Dasie Hamburg MD 05/31/2024 03:31 PM EST RP Workstation: [id number]   US  Venous Img Lower Bilateral (DVT) Result Date: 05/19/2024 CLINICAL DATA:  Lower extremity pain. EXAM: BILATERAL LOWER EXTREMITY VENOUS DOPPLER ULTRASOUND TECHNIQUE: Gray-scale sonography with graded compression, as well as color  Doppler and duplex ultrasound were performed to evaluate the lower extremity deep venous systems from the level of the common femoral vein and including the common femoral, femoral, profunda femoral, popliteal and calf veins including the posterior tibial, peroneal and gastrocnemius veins when visible. The superficial great saphenous vein was also interrogated. Spectral Doppler was utilized to evaluate flow at rest and with distal augmentation maneuvers in the common femoral, femoral and popliteal veins. COMPARISON:  None Available. FINDINGS: RIGHT LOWER EXTREMITY Common Femoral Vein: No evidence of thrombus. Normal compressibility, respiratory phasicity and response to augmentation. Saphenofemoral Junction: No evidence of thrombus. Normal compressibility and flow on color Doppler imaging. Profunda Femoral Vein: No evidence of thrombus. Normal compressibility and flow on color Doppler imaging. Femoral Vein: No evidence of thrombus. Normal compressibility, respiratory phasicity and response to augmentation. Popliteal Vein: No evidence of thrombus. Normal compressibility, respiratory phasicity and response to augmentation. Calf Veins: No evidence of thrombus. Normal compressibility and flow on color Doppler imaging. Superficial Great Saphenous Vein: No evidence of thrombus. Normal compressibility. Venous Reflux:  None. Other Findings: No evidence of superficial thrombophlebitis or abnormal fluid collection. LEFT LOWER EXTREMITY Common Femoral Vein: No evidence of thrombus. Normal compressibility, respiratory phasicity and  response to augmentation. Saphenofemoral Junction: No evidence of thrombus. Normal compressibility and flow on color Doppler imaging. Profunda Femoral Vein: No evidence of thrombus. Normal compressibility and flow on color Doppler imaging. Femoral Vein: No evidence of thrombus. Normal compressibility, respiratory phasicity and response to augmentation. Popliteal Vein: No evidence of thrombus. Normal compressibility, respiratory phasicity and response to augmentation. Calf Veins: No evidence of thrombus. Normal compressibility and flow on color Doppler imaging. Superficial Great Saphenous Vein: No evidence of thrombus. Normal compressibility. Venous Reflux:  None. Other Findings: No evidence of superficial thrombophlebitis or abnormal fluid collection. IMPRESSION: No evidence of deep venous thrombosis in either lower extremity. Electronically Signed   By: Marcey Moan M.D.   On: 05/19/2024 17:20   CT Angio Chest Pulmonary Embolism (PE) W or WO Contrast Result Date: 05/18/2024 CLINICAL DATA:  Concern for pulmonary embolism.  Lymphoma. EXAM: CT ANGIOGRAPHY CHEST WITH CONTRAST TECHNIQUE: Multidetector CT imaging of the chest was performed using the standard protocol during bolus administration of intravenous contrast. Multiplanar CT image reconstructions and MIPs were obtained to evaluate the vascular anatomy. RADIATION DOSE REDUCTION: This exam was performed according to the departmental dose-optimization program which includes automated exposure control, adjustment of the mA and/or kV according to patient size and/or use of iterative reconstruction technique. CONTRAST:  50mL OMNIPAQUE  IOHEXOL  350 MG/ML SOLN COMPARISON:  Chest radiograph dated 05/17/2024. FINDINGS: Cardiovascular: There is no cardiomegaly or pericardial effusion. The thoracic aorta is unremarkable. The origins of the great vessels of the aortic arch are patent. Right-sided Port-A-Cath with tip in the right atrium at the cavoatrial junction.  Linear nonocclusive right lower lobe subsegmental pulmonary artery embolus, age indeterminate. No CT evidence of right heart straining. Mediastinum/Nodes: No hilar or mediastinal adenopathy. The esophagus and the thyroid gland are grossly unremarkable no mediastinal fluid collection. Lungs/Pleura: Minimal bibasilar linear atelectasis. No focal consolidation, pleural effusion, or pneumothorax. The central airways are patent. Upper Abdomen: No acute abnormality. Musculoskeletal: Degenerative changes of spine. No acute osseous pathology. Bilateral shoulder arthroplasties. Review of the MIP images confirms the above findings. IMPRESSION: 1. Age indeterminate linear nonocclusive right lower lobe subsegmental pulmonary artery embolus. No CT evidence of right heart straining. 2. No focal consolidation. These results will be called to the ordering clinician or representative by the Radiologist  Geophysicist/field Seismologist, and communication documented in the PACS or [employer]. Electronically Signed   By: Vanetta Chou M.D.   On: 05/18/2024 14:56   CT ABDOMEN PELVIS W CONTRAST Result Date: 05/17/2024 CLINICAL DATA:  Abdominal pain.  Concern for kidney stone. EXAM: CT ABDOMEN AND PELVIS WITH CONTRAST TECHNIQUE: Multidetector CT imaging of the abdomen and pelvis was performed using the standard protocol following bolus administration of intravenous contrast. RADIATION DOSE REDUCTION: This exam was performed according to the departmental dose-optimization program which includes automated exposure control, adjustment of the mA and/or kV according to patient size and/or use of iterative reconstruction technique. CONTRAST:  OMNIPAQUE  IOHEXOL  300 MG/ML  SOLN COMPARISON:  CT abdomen pelvis dated 04/08/2024. FINDINGS: Lower chest: The visualized lung bases are clear. Tiny nonocclusive embolus in the right lower lobe subsegmental branch (2/2). Chest CT may provide better evaluation. No intra-abdominal free air or free fluid.  Hepatobiliary: The liver is unremarkable. No biliary dilatation. The gallbladder is unremarkable. Pancreas: Unremarkable. No pancreatic ductal dilatation or surrounding inflammatory changes. Spleen: Normal in size without focal abnormality. Adrenals/Urinary Tract: The adrenal glands unremarkable. Left ureteral stent with proximal tip in the inferior pole collecting system and distal end in the urinary bladder. There is mild left hydronephrosis. There is urothelial enhancement of the left renal collecting system and ureter suggestive of ascending UTI. No stone identified. The right kidney, right ureter, and urinary bladder appear unremarkable. Stomach/Bowel: Moderate stool throughout the colon. There is no bowel obstruction or active inflammation. Appendectomy. Vascular/Lymphatic: The abdominal aorta and IVC unremarkable. No portal venous gas. There is no adenopathy. Reproductive: The uterus is grossly unremarkable. Other: Interval decrease in the masslike soft tissues thickening of the left pelvic sidewall measuring approximately 5.8 x 3.3 cm in greatest axial dimensions (previously 6.7 x 4.8 cm). A 1.4 x 1.2 cm rim enhancing low attenuating focus within this soft tissue mass (coronal 52/5) suspicious for an area of phlegmonous change or developing abscess. Musculoskeletal: Degenerative changes of the spine. No acute osseous pathology. IMPRESSION: 1. Left ureteral stent with mild left hydronephrosis and findings suggestive of ascending UTI. Correlation with urinalysis recommended. 2. Interval decrease in the masslike soft tissues thickening of the left pelvic sidewall. A 1.4 x 1.2 cm rim enhancing low attenuating focus within this soft tissue mass suspicious for an area of phlegmonous change or developing abscess. 3. Tiny nonocclusive embolus in the right lower lobe subsegmental branch. Chest CT may provide better evaluation. These results were called by telephone at the time of interpretation on 05/17/2024 at 6:28  pm to provider LAMAR PRICE , who verbally acknowledged these results. Electronically Signed   By: Vanetta Chou M.D.   On: 05/17/2024 18:39   DG Chest Port 1 View Result Date: 05/17/2024 CLINICAL DATA:  Sepsis. EXAM: PORTABLE CHEST 1 VIEW COMPARISON:  Chest radiograph dated 09/17/2012 FINDINGS: Right-sided PICC with tip over central SVC. No focal consolidation, pleural effusion, pneumothorax. The cardiac silhouette is within limits. No acute osseous pathology. Bilateral shoulder arthroplasties. IMPRESSION: No active disease. Electronically Signed   By: Vanetta Chou M.D.   On: 05/17/2024 16:38    PERFORMANCE STATUS (ECOG) : 2 - Symptomatic, <50% confined to bed  Review of Systems Unless otherwise noted, a complete review of systems is negative.  Physical Exam General: NAD Cardiovascular: regular rate and rhythm Pulmonary: clear ant fields Abdomen: soft, nontender, + bowel sounds GU: no suprapubic tenderness Extremities: no edema, no joint deformities Skin: no rashes Neurological: Weakness but otherwise nonfocal  IMPRESSION/PLAN:  DLBCL -on R-CHOP chemotherapy.  Dose reduced due to poor tolerance.   Orthostasis -secondary to poor oral intake.  Proceed with IV fluids today.  Will bring patient back to clinic on Monday for repeat fluids.   Weakness - Home health PT  Peripheral neuropathy -patient reports some improvement on duloxetine .  She saw Dr. Buckley today.    Patient will have MD follow-up next week.  Case and plan discussed with Dr. Babara.   Patient expressed understanding and was in agreement with this plan. She also understands that She can call clinic at any time with any questions, concerns, or complaints.   Thank you for allowing me to participate in the care of this very pleasant patient.   Time Total: 20 minutes  Visit consisted of counseling and education dealing with the complex and emotionally intense issues of symptom management in the setting of serious  illness.Greater than 50%  of this time was spent counseling and coordinating care related to the above assessment and plan.  Signed by: Fonda Mower, PhD, NP-C

## 2024-06-02 NOTE — Progress Notes (Signed)
 Patient states she is nauseated and is having a great deal of weakness. Patient states this all started after chemo. Patient states she believes she may be dehydrated. Patient states she is having pain in the left leg. Pain is at a 9 at the moment.

## 2024-06-02 NOTE — Progress Notes (Signed)
 [facility] at [facility] [address]  [place], [place] [address] [phone]   New Patient Evaluation  Date of Service: 06/02/24 Patient Name: Stacey [name] Patient MRN: [id number] Patient DOB: Feb 03, 1961 Provider: Arthea MARLA Manns, MD  Identifying Statement:  Stacey [name] is a 63 y.o. female with Left leg weakness - Plan: Ambulatory referral to Physical Therapy who presents for initial consultation and evaluation regarding cancer associated neurologic deficits.    Referring Provider: Babara Call, MD [address] [place],  [place] [address]  Primary Cancer:  Oncologic History: Oncology History  Diffuse large B-cell lymphoma (HCC)  04/08/2024 Imaging   CT renal stone study showed  1. Mild left hydroureteronephrosis with periureteral/peripelvic stranding, no urinary calculi. 2. Imaging findings are compatible with recently passed left ureteral stone versus acute left pyelonephritis.   04/08/2024 Imaging   CT angiogram abdomen/pelvis with and without contrast  1. Obstructing 5.9 x 4.5 cm enhancing mass along the left pelvic sidewall causing left hydroureter, hydronephrosis, delayed nephrogram, and nephromegaly; mass abuts the left adnexa with loss of the intervening fat plane. This is highly concerning for malignant neoplasm. Etiology indeterminate. It's possible that this could be arising from a primary urothelial neoplasm of the left ureter. Other primary neoplasms or metastatic disease not excluded. Correlation with tissue sampling is advised. 2. Patent abdominal vasculature without signs of mesenteric ischemia.   04/24/2024 Imaging   PET scan showed  1. The posterior left pelvic sidewall mass is hypermetabolic, consistent with active lymphoma. 2. Mild splenic hypermetabolism relative to the liver. No splenomegaly. Cannot exclude lymphomatous involvement. 3. New small right pleural effusion. 4. No active lymphoma within the chest or  neck. Mildly decreased sensitivity exam secondary to hypermetabolic brown fat. 5. Age advanced coronary artery atherosclerosis. Recommend assessment of coronary risk factors. 6.  Aortic Atherosclerosis    04/26/2024 Initial Diagnosis   Lymphoma (HCC)  Symptoms began on 03/10/2024 with pain radiating down the left leg, associated with the sciatic nerve. Initially managed with high doses of ibuprofen, which provided some relief, but the pain worsened within a week, leading to an emergency room visit where she received a steroid injection in her lower back.  04/08/2024 she returned to the hospital due to increased pain. A CT scan revealed a pelvic mass obstructing the ureter, and a stent was placed by a urologist.  Patient underwent biopsy of pelvis mass Pathology showed 1. Lymph node, needle/core biopsy, 18G cores infilatratic left pelvic sidewall mass :       LARGE B-CELL LYMPHOMA.  soft      tissue with atypical lymphoid infiltrate comprised predominantly of medium to      large sized cells.  Abundant mitotic figures are present.  Focal areas of      necrosis are noted.  Immunohistochemical stains reveal the malignant cells are      positive for CD20, PAX5, BCL6 (subset), MUM1, Bcl-2 and are negative for CD10      and cyclin D1.  CD30 does not show any significant expression. CD15 highlights      neutrophils. The Ki-67 proliferation index is high approximately 70%.  This lymphoma has a activated B-cell immunophenotype.   EBER-ish is pending and will be reported in an addendum.  Also,High-grade B-cell lymphoma FISH panel will be   ordered to exclude a double/triple hit lymphoma and results will be reported in an addendum   She has experienced unintentional weight loss of 18 pounds since May, without any  changes in diet or exercise. No night sweats, low-grade fever, or excessive sweating at night. Baseline health includes chronic back pain, acid reflux, prediabetes, and constipation.  She is  taking gabapentin  100 mg once daily for nerve pain, described as sometimes dull and sometimes sharp. Oxycodone  5 mg every six hours for pain management, which helps reduce the pain slightly.  She notes reduced appetite, numbness, and tingling in her leg. Also mentions leg swelling, particularly at night when less active.   04/26/2024 Cancer Staging   Staging form: Hodgkin and Non-Hodgkin Lymphoma, AJCC 8th Edition - Clinical stage from 04/26/2024: Stage II (Diffuse large B-cell lymphoma) - Signed by Babara Call, MD on 04/26/2024 Stage prefix: Initial diagnosis   05/01/2024 Bone Marrow Biopsy   Bone marrow biopsy results showed  BONE MARROW, ASPIRATE, CLOT, CORE:  - Mildly hypocellular bone marrow with otherwise orderly trilineage hematopoiesis  - No morphologic or immunophenotypic evidence of the patient's recently diagnosed large B-cell lymphoma    05/10/2024 -  Chemotherapy   Patient is on Treatment Plan : NON-HODGKINS LYMPHOMA R-CHOP q21d x 4 cycles       History of Present Illness: The patient's records from the referring physician were obtained and reviewed and the patient interviewed to confirm this HPI.  Stacey [name] presents today for evaluation for neuropathy.  She describes pain in her left lower leg and foot, alongside numbness and pins and needles.  Pain is severe to the point that even a bedsheet causes discomfort.  This was present very mildly before treatments, but worsened considerably following cycle 1 of lymphoma systemic therapy at the end of October.  In addition, there is weakness described in the left foot, ankle and lower leg.  She is using a wheelchair or walker for ambulation.  Prior to this she had sciatic pain, with discomfort running down the back of her left leg.  She had undergone a steroid injection through her orthopedic provider which helped relieve the discomfort temporarily.  Medications: Current Outpatient Medications on File Prior to Visit   Medication Sig Dispense Refill   acyclovir  (ZOVIRAX ) 400 MG tablet Take 1 tablet (400 mg total) by mouth daily. 30 tablet 3   allopurinol  (ZYLOPRIM ) 300 MG tablet Take 1 tablet (300 mg total) by mouth daily. 30 tablet 3   amoxicillin  (AMOXIL ) 500 MG capsule Take 2 capsules (1,000 mg total) by mouth 3 (three) times daily for 3 days. 18 capsule 0   apixaban  (ELIQUIS ) 5 MG TABS tablet Take 2 tablets (10 mg total) by mouth 2 (two) times daily for 3 days, THEN 1 tablet (5 mg total) 2 (two) times daily. 60 tablet 2   DULoxetine  (CYMBALTA ) 30 MG capsule Take 1 capsule (30 mg total) by mouth daily. 30 capsule 3   fentaNYL  (DURAGESIC ) 25 MCG/HR Place 1 patch onto the skin every 3 (three) days. 5 patch 0   lidocaine -prilocaine  (EMLA ) cream Apply to affected area once 30 g 3   loratadine  (CLARITIN ) 10 MG tablet Take 10 mg by mouth at bedtime.     magic mouthwash (multi-ingredient) oral suspension Swish and swallow 5-10 mLs 4 (four) times daily as needed for mouth pain. 480 mL 1   methocarbamol  (ROBAXIN ) 500 MG tablet Take 500 mg by mouth every 8 (eight) hours as needed for muscle spasms.  0   omeprazole  (PRILOSEC) 20 MG capsule TAKE 1 CAPSULE (20 MG TOTAL) BY MOUTH 2 (TWO) TIMES DAILY BEFORE A MEAL. 180 capsule 0   ondansetron  (ZOFRAN ) 8 MG  tablet Take 1 tablet (8 mg total) by mouth every 8 (eight) hours as needed for nausea or vomiting. Start on the third day after cyclophosphamide  chemotherapy. 30 tablet 1   oxybutynin  (DITROPAN -XL) 10 MG 24 hr tablet TAKE 1 TABLET BY MOUTH EVERY DAY 30 tablet 0   Oxycodone  HCl 10 MG TABS Take 1 tablet (10 mg total) by mouth every 4 (four) hours as needed (pain). 60 tablet 0   potassium chloride  SA (KLOR-CON  M) 20 MEQ tablet Take 1 tablet (20 mEq total) by mouth daily. 7 tablet 0   predniSONE  (DELTASONE ) 20 MG tablet Take 5 tablets (100 mg total) by mouth as directed. Take with food on days 2-5 of chemotherapy. 20 tablet 4   prochlorperazine  (COMPAZINE ) 10 MG tablet Take 1  tablet (10 mg total) by mouth every 6 (six) hours as needed for nausea or vomiting. 30 tablet 6   tamsulosin  (FLOMAX ) 0.4 MG CAPS capsule TAKE 1 CAPSULE BY MOUTH EVERY DAY 30 capsule 0   traZODone  (DESYREL ) 50 MG tablet Take 1 tablet (50 mg total) by mouth at bedtime as needed for sleep. 30 tablet 3   sodium phosphate  (FLEET) ENEM Place 133 mLs (1 enema total) rectally daily as needed for severe constipation. (Patient not taking: Reported on 06/02/2024) 3 mL 0   No current facility-administered medications on file prior to visit.    Allergies:  Allergies  Allergen Reactions   Propofol  Nausea Only    Patient states medication can be tolerated with an antiemetic before taking   Past Medical History:  Past Medical History:  Diagnosis Date   Arthritis    left shoulder, neck, lower back  (01/11/2018)   Chronic lower back pain    Family history of adverse reaction to anesthesia    Mother has nausea   GERD (gastroesophageal reflux disease)    PONV (postoperative nausea and vomiting)    Pre-diabetes    UTI (urinary tract infection) 03/20/2021   Past Surgical History:  Past Surgical History:  Procedure Laterality Date   APPENDECTOMY     CYSTOSCOPY W/ URETERAL STENT PLACEMENT Left 04/08/2024   Procedure: CYSTOSCOPY, WITH RETROGRADE PYELOGRAM AND URETERAL STENT INSERTION;  Surgeon: Watt Rush, MD;  Location: [facility] ORS;  Service: Urology;  Laterality: Left;   IR IMAGING GUIDED PORT INSERTION  05/01/2024   JOINT REPLACEMENT     TOTAL SHOULDER ARTHROPLASTY Left 01/11/2018   TOTAL SHOULDER ARTHROPLASTY Left 01/11/2018   Procedure: LEFT TOTAL SHOULDER ARTHROPLASTY;  Surgeon: Sharl Selinda Dover, MD;  Location: [facility] OR;  Service: Orthopedics;  Laterality: Left;  2.5 hrs   TOTAL SHOULDER ARTHROPLASTY Right 02/08/2020   Procedure: TOTAL SHOULDER ARTHROPLASTY;  Surgeon: Sharl Selinda Dover, MD;  Location: [facility] OR;  Service: Orthopedics;  Laterality: Right;  2.5 hrs RNFA   Social History:   Social History   Socioeconomic History   Marital status: Married    Spouse name: Malon Siddall   Number of children: Not on file   Years of education: Not on file   Highest education level: Not on file  Occupational History   Not on file  Tobacco Use   Smoking status: Never   Smokeless tobacco: Never  Vaping Use   Vaping status: Never Used  Substance and Sexual Activity   Alcohol use: Not Currently    Comment: 01/11/2018 might have 1 drink/month   Drug use: Never   Sexual activity: Yes  Other Topics Concern   Not on file  Social History Narrative   Not  on file   Social Drivers of Health   Financial Resource Strain: Low Risk  (03/30/2024)   Received from [facility]   Overall Financial Resource Strain (CARDIA)    Difficulty of Paying Living Expenses: Not hard at all  Food Insecurity: No Food Insecurity (06/02/2024)   Hunger Vital Sign    Worried About Running Out of Food in the Last Year: Never true    Ran Out of Food in the Last Year: Never true  Transportation Needs: No Transportation Needs (06/02/2024)   PRAPARE - Administrator, Civil Service (Medical): No    Lack of Transportation (Non-Medical): No  Physical Activity: Not on file  Stress: Not on file  Social Connections: Socially Integrated (05/17/2024)   Social Connection and Isolation Panel    Frequency of Communication with Friends and Family: Three times a week    Frequency of Social Gatherings with Friends and Family: Three times a week    Attends Religious Services: 1 to 4 times per year    Active Member of Clubs or Organizations: Yes    Attends Banker Meetings: 1 to 4 times per year    Marital Status: Married  Catering Manager Violence: Not At Risk (06/02/2024)   Humiliation, Afraid, Rape, and Kick questionnaire    Fear of Current or Ex-Partner: No    Emotionally Abused: No    Physically Abused: No    Sexually Abused: No   Family History:  Family History   Problem Relation Age of Onset   Hypertension Mother    Heart disease Father    Heart attack Father    CAD Father    Colon cancer Neg Hx    Esophageal cancer Neg Hx    Rectal cancer Neg Hx    Stomach cancer Neg Hx     Review of Systems: Constitutional: Doesn't report fevers, chills or abnormal weight loss Eyes: Doesn't report blurriness of vision Ears, nose, mouth, throat, and face: Doesn't report sore throat Respiratory: Doesn't report cough, dyspnea or wheezes Cardiovascular: Doesn't report palpitation, chest discomfort  Gastrointestinal:  Doesn't report nausea, constipation, diarrhea GU: Doesn't report incontinence Skin: Doesn't report skin rashes Neurological: Per HPI Musculoskeletal: Doesn't report joint pain Behavioral/Psych: Doesn't report anxiety  Physical Exam: Vitals:   06/02/24 1013  BP: 109/76  Pulse: 73  Resp: 16  Temp: 98.6 F (37 C)  SpO2: 95%   KPS: 60. General: Alert, cooperative, pleasant, in no acute distress Head: Normal EENT: No conjunctival injection or scleral icterus.  Lungs: Resp effort normal Cardiac: Regular rate Abdomen: Non-distended abdomen Skin: No rashes cyanosis or petechiae. Extremities: No clubbing or edema  Neurologic Exam: Mental Status: Awake, alert, attentive to examiner. Oriented to self and environment. Language is fluent with intact comprehension.  Cranial Nerves: Visual acuity is grossly normal. Visual fields are full. Extra-ocular movements intact. No ptosis. Face is symmetric Motor: Tone and bulk are normal. Power is 3/5 in left ankle dorsiflexion and plantar flexion, 4/5 knee extension and flexion, 5/5 hip flexion. Reflexes are intact at arms and patella b/l, absent in ankles, no pathologic reflexes present.  Sensory: Impaired left lower leg Gait: Non ambulatory   Labs: I have reviewed the data as listed    Component Value Date/Time   NA 134 (L) 06/02/2024 1059   NA 139 09/17/2012 2231   K 3.6 06/02/2024 1059    K 4.0 09/17/2012 2231   CL 101 06/02/2024 1059   CL 106 09/17/2012 2231  CO2 23 06/02/2024 1059   CO2 29 09/17/2012 2231   GLUCOSE 139 (H) 06/02/2024 1059   GLUCOSE 131 (H) 09/17/2012 2231   BUN 24 (H) 06/02/2024 1059   BUN 13 09/17/2012 2231   CREATININE 0.59 06/02/2024 1059   CREATININE 0.94 09/17/2012 2231   CALCIUM 8.7 (L) 06/02/2024 1059   CALCIUM 8.9 09/17/2012 2231   PROT 6.4 (L) 06/02/2024 1059   PROT 8.1 09/17/2012 2231   ALBUMIN 3.5 06/02/2024 1059   ALBUMIN 3.8 09/17/2012 2231   AST 15 06/02/2024 1059   ALT 11 06/02/2024 1059   ALT 23 09/17/2012 2231   ALKPHOS 41 06/02/2024 1059   ALKPHOS 98 09/17/2012 2231   BILITOT 0.5 06/02/2024 1059   GFRNONAA >60 06/02/2024 1059   GFRNONAA >60 09/17/2012 2231   GFRAA >60 04/05/2020 1424   GFRAA >60 09/17/2012 2231   Lab Results  Component Value Date   WBC 6.5 06/02/2024   NEUTROABS 5.8 06/02/2024   HGB 10.4 (L) 06/02/2024   HCT 30.5 (L) 06/02/2024   MCV 93.0 06/02/2024   PLT 392 06/02/2024    Imaging:  MR Lumbar Spine W Wo Contrast Result Date: 05/31/2024 EXAM: MRI LUMBAR SPINE 05/31/2024 03:19:00 PM TECHNIQUE: Multiplanar multisequence MRI of the lumbar spine was performed with and without the administration of intravenous contrast. 7 mL (gadobutrol  (GADAVIST ) 1 MMOL/ML injection 7 mL GADOBUTROL  1 MMOL/ML IV SOLN). COMPARISON: CT abdomen and pelvis 05/17/2024. CLINICAL HISTORY: Metastatic disease evaluation; low back pain with numbness down left leg. History of lymphoma. FINDINGS: BONES AND ALIGNMENT: 5 lumbar type vertebrae. Mild lumbar dextroscoliosis. Grade 1 retrolisthesis of L1-L2 and L2-L3 and grade 1 anterolisthesis of L4-L5. No fracture or suspicious marrow lesion. Mild bilateral facet edema at L4-L5. SPINAL CORD: The conus medullaris terminates at L1-L2 and is normal in signal. No evidence of epidural tumor. SOFT TISSUES: No paraspinal mass. INCIDENTAL FINDINGS: Mild left hydronephrosis, similar to the prior CT.  DISC LEVELS: Disc desiccation primarily from L1-L2 through L4-L5. Severe disc space narrowing at L1-L2 and mild to moderate narrowing at L2-L3 and L3-L4. T12-L1: A broad central disc protrusion results in mild spinal stenosis. Patent neural foramina. L1-L2: Disc bulging and mild facet hypertrophy without significant stenosis. L2-L3: Circumferential disc bulging mildly eccentric to the left and moderate to severe facet hypertrophy result in mild left lateral recess stenosis and mild bilateral neural foraminal stenosis without spinal stenosis. L3-L4: Disc bulging, prominent epidural fat, and moderate to severe facet hypertrophy result in mild to moderate spinal stenosis, mild right greater than left lateral recess stenosis, and mild bilateral neural foraminal stenosis. L4-L5: Anterolisthesis with mild bulging of uncovered disc and severe facet hypertrophy result in mild bilateral lateral recess stenosis and mild bilateral neural foraminal stenosis without significant spinal stenosis. L5-S1: Severe facet hypertrophy without disc herniation or stenosis. IMPRESSION: 1. No evidence of metastatic disease in the lumbar spine. 2. Diffuse disc and facet degeneration with mild to moderate spinal stenosis at L3-L4. 3. Mild lateral recess and neural foraminal stenosis at L2-3 and L4-5. 4. Mild left hydronephrosis, similar to prior CT. Electronically signed by: Dasie Hamburg MD 05/31/2024 03:31 PM EST RP Workstation: [id number]   US  Venous Img Lower Bilateral (DVT) Result Date: 05/19/2024 CLINICAL DATA:  Lower extremity pain. EXAM: BILATERAL LOWER EXTREMITY VENOUS DOPPLER ULTRASOUND TECHNIQUE: Gray-scale sonography with graded compression, as well as color Doppler and duplex ultrasound were performed to evaluate the lower extremity deep venous systems from the level of the common femoral vein and including the common  femoral, femoral, profunda femoral, popliteal and calf veins including the posterior tibial, peroneal and  gastrocnemius veins when visible. The superficial great saphenous vein was also interrogated. Spectral Doppler was utilized to evaluate flow at rest and with distal augmentation maneuvers in the common femoral, femoral and popliteal veins. COMPARISON:  None Available. FINDINGS: RIGHT LOWER EXTREMITY Common Femoral Vein: No evidence of thrombus. Normal compressibility, respiratory phasicity and response to augmentation. Saphenofemoral Junction: No evidence of thrombus. Normal compressibility and flow on color Doppler imaging. Profunda Femoral Vein: No evidence of thrombus. Normal compressibility and flow on color Doppler imaging. Femoral Vein: No evidence of thrombus. Normal compressibility, respiratory phasicity and response to augmentation. Popliteal Vein: No evidence of thrombus. Normal compressibility, respiratory phasicity and response to augmentation. Calf Veins: No evidence of thrombus. Normal compressibility and flow on color Doppler imaging. Superficial Great Saphenous Vein: No evidence of thrombus. Normal compressibility. Venous Reflux:  None. Other Findings: No evidence of superficial thrombophlebitis or abnormal fluid collection. LEFT LOWER EXTREMITY Common Femoral Vein: No evidence of thrombus. Normal compressibility, respiratory phasicity and response to augmentation. Saphenofemoral Junction: No evidence of thrombus. Normal compressibility and flow on color Doppler imaging. Profunda Femoral Vein: No evidence of thrombus. Normal compressibility and flow on color Doppler imaging. Femoral Vein: No evidence of thrombus. Normal compressibility, respiratory phasicity and response to augmentation. Popliteal Vein: No evidence of thrombus. Normal compressibility, respiratory phasicity and response to augmentation. Calf Veins: No evidence of thrombus. Normal compressibility and flow on color Doppler imaging. Superficial Great Saphenous Vein: No evidence of thrombus. Normal compressibility. Venous Reflux:  None.  Other Findings: No evidence of superficial thrombophlebitis or abnormal fluid collection. IMPRESSION: No evidence of deep venous thrombosis in either lower extremity. Electronically Signed   By: Marcey Moan M.D.   On: 05/19/2024 17:20   CT Angio Chest Pulmonary Embolism (PE) W or WO Contrast Result Date: 05/18/2024 CLINICAL DATA:  Concern for pulmonary embolism.  Lymphoma. EXAM: CT ANGIOGRAPHY CHEST WITH CONTRAST TECHNIQUE: Multidetector CT imaging of the chest was performed using the standard protocol during bolus administration of intravenous contrast. Multiplanar CT image reconstructions and MIPs were obtained to evaluate the vascular anatomy. RADIATION DOSE REDUCTION: This exam was performed according to the departmental dose-optimization program which includes automated exposure control, adjustment of the mA and/or kV according to patient size and/or use of iterative reconstruction technique. CONTRAST:  50mL OMNIPAQUE  IOHEXOL  350 MG/ML SOLN COMPARISON:  Chest radiograph dated 05/17/2024. FINDINGS: Cardiovascular: There is no cardiomegaly or pericardial effusion. The thoracic aorta is unremarkable. The origins of the great vessels of the aortic arch are patent. Right-sided Port-A-Cath with tip in the right atrium at the cavoatrial junction. Linear nonocclusive right lower lobe subsegmental pulmonary artery embolus, age indeterminate. No CT evidence of right heart straining. Mediastinum/Nodes: No hilar or mediastinal adenopathy. The esophagus and the thyroid gland are grossly unremarkable no mediastinal fluid collection. Lungs/Pleura: Minimal bibasilar linear atelectasis. No focal consolidation, pleural effusion, or pneumothorax. The central airways are patent. Upper Abdomen: No acute abnormality. Musculoskeletal: Degenerative changes of spine. No acute osseous pathology. Bilateral shoulder arthroplasties. Review of the MIP images confirms the above findings. IMPRESSION: 1. Age indeterminate linear  nonocclusive right lower lobe subsegmental pulmonary artery embolus. No CT evidence of right heart straining. 2. No focal consolidation. These results will be called to the ordering clinician or representative by the Radiologist Assistant, and communication documented in the PACS or [employer]. Electronically Signed   By: Vanetta Chou M.D.   On: 05/18/2024 14:56  CT ABDOMEN PELVIS W CONTRAST Result Date: 05/17/2024 CLINICAL DATA:  Abdominal pain.  Concern for kidney stone. EXAM: CT ABDOMEN AND PELVIS WITH CONTRAST TECHNIQUE: Multidetector CT imaging of the abdomen and pelvis was performed using the standard protocol following bolus administration of intravenous contrast. RADIATION DOSE REDUCTION: This exam was performed according to the departmental dose-optimization program which includes automated exposure control, adjustment of the mA and/or kV according to patient size and/or use of iterative reconstruction technique. CONTRAST:  OMNIPAQUE  IOHEXOL  300 MG/ML  SOLN COMPARISON:  CT abdomen pelvis dated 04/08/2024. FINDINGS: Lower chest: The visualized lung bases are clear. Tiny nonocclusive embolus in the right lower lobe subsegmental branch (2/2). Chest CT may provide better evaluation. No intra-abdominal free air or free fluid. Hepatobiliary: The liver is unremarkable. No biliary dilatation. The gallbladder is unremarkable. Pancreas: Unremarkable. No pancreatic ductal dilatation or surrounding inflammatory changes. Spleen: Normal in size without focal abnormality. Adrenals/Urinary Tract: The adrenal glands unremarkable. Left ureteral stent with proximal tip in the inferior pole collecting system and distal end in the urinary bladder. There is mild left hydronephrosis. There is urothelial enhancement of the left renal collecting system and ureter suggestive of ascending UTI. No stone identified. The right kidney, right ureter, and urinary bladder appear unremarkable. Stomach/Bowel: Moderate  stool throughout the colon. There is no bowel obstruction or active inflammation. Appendectomy. Vascular/Lymphatic: The abdominal aorta and IVC unremarkable. No portal venous gas. There is no adenopathy. Reproductive: The uterus is grossly unremarkable. Other: Interval decrease in the masslike soft tissues thickening of the left pelvic sidewall measuring approximately 5.8 x 3.3 cm in greatest axial dimensions (previously 6.7 x 4.8 cm). A 1.4 x 1.2 cm rim enhancing low attenuating focus within this soft tissue mass (coronal 52/5) suspicious for an area of phlegmonous change or developing abscess. Musculoskeletal: Degenerative changes of the spine. No acute osseous pathology. IMPRESSION: 1. Left ureteral stent with mild left hydronephrosis and findings suggestive of ascending UTI. Correlation with urinalysis recommended. 2. Interval decrease in the masslike soft tissues thickening of the left pelvic sidewall. A 1.4 x 1.2 cm rim enhancing low attenuating focus within this soft tissue mass suspicious for an area of phlegmonous change or developing abscess. 3. Tiny nonocclusive embolus in the right lower lobe subsegmental branch. Chest CT may provide better evaluation. These results were called by telephone at the time of interpretation on 05/17/2024 at 6:28 pm to provider LAMAR PRICE , who verbally acknowledged these results. Electronically Signed   By: Vanetta Chou M.D.   On: 05/17/2024 18:39   DG Chest Port 1 View Result Date: 05/17/2024 CLINICAL DATA:  Sepsis. EXAM: PORTABLE CHEST 1 VIEW COMPARISON:  Chest radiograph dated 09/17/2012 FINDINGS: Right-sided PICC with tip over central SVC. No focal consolidation, pleural effusion, pneumothorax. The cardiac silhouette is within limits. No acute osseous pathology. Bilateral shoulder arthroplasties. IMPRESSION: No active disease. Electronically Signed   By: Vanetta Chou M.D.   On: 05/17/2024 16:38     Assessment/Plan Left leg weakness - Plan: Ambulatory  referral to Physical Therapy  Stacey [name] presents with clinical syndrome localizing to sciatic and peroneal nerve in her left leg.  She has both sensory and motor nerve dysfunction. Etiology is likely multifactorial; baseline sciatica, plus addition of vincristine  chemotherapy.  Vincristine  has been discontinued already by Dr. Babara, she continues on the rest of the R-[facility] protocol otherwise.  Gabapentin  1200mg  daily has been ineffective.  MRI lumbar spine was reviewed, there is no severe neuro-foraminal stenosis that would need  to be addressed surgically.  Recommended the following: -Stop vincristine  (already done) -Trial of Lyrica  75mg  BID to replace gabapentin .  May con't cymbalta  30mg . -Refer for physical therapy -Defer EMG for now, will recommend this if symptoms worsen  We will see her back in ~2 months to re-examine and adjust medications.  We spent twenty additional minutes teaching regarding the natural history, biology, and historical experience in the treatment of neurologic complications of cancer.   We appreciate the opportunity to participate in the care of Stacey [name].   We ask that Stacey [name] return to clinic in 2 months, or sooner as needed.  All questions were answered. The patient knows to call the clinic with any problems, questions or concerns. No barriers to learning were detected.  The total time spent in the encounter was 40 minutes and more than 50% was on counseling and review of test results   Arthea MARLA Manns, MD Medical Director of Neuro-Oncology [facility] 06/02/24 11:33 AM

## 2024-06-03 ENCOUNTER — Emergency Department

## 2024-06-03 ENCOUNTER — Inpatient Hospital Stay
Admission: EM | Admit: 2024-06-03 | Discharge: 2024-06-06 | DRG: 872 | Disposition: A | Discharge status: Home / Self Care | Attending: Internal Medicine | Admitting: Internal Medicine

## 2024-06-03 ENCOUNTER — Other Ambulatory Visit: Payer: Self-pay

## 2024-06-03 DIAGNOSIS — R6520 Severe sepsis without septic shock: Secondary | ICD-10-CM | POA: Diagnosis present

## 2024-06-03 DIAGNOSIS — K219 Gastro-esophageal reflux disease without esophagitis: Secondary | ICD-10-CM | POA: Diagnosis present

## 2024-06-03 DIAGNOSIS — B952 Enterococcus as the cause of diseases classified elsewhere: Secondary | ICD-10-CM | POA: Diagnosis not present

## 2024-06-03 DIAGNOSIS — Z79899 Other long term (current) drug therapy: Secondary | ICD-10-CM | POA: Diagnosis not present

## 2024-06-03 DIAGNOSIS — R29898 Other symptoms and signs involving the musculoskeletal system: Secondary | ICD-10-CM | POA: Diagnosis not present

## 2024-06-03 DIAGNOSIS — Z96612 Presence of left artificial shoulder joint: Secondary | ICD-10-CM | POA: Diagnosis present

## 2024-06-03 DIAGNOSIS — N136 Pyonephrosis: Secondary | ICD-10-CM | POA: Diagnosis present

## 2024-06-03 DIAGNOSIS — C8330 Diffuse large B-cell lymphoma, unspecified site: Secondary | ICD-10-CM | POA: Diagnosis present

## 2024-06-03 DIAGNOSIS — N3000 Acute cystitis without hematuria: Secondary | ICD-10-CM | POA: Diagnosis not present

## 2024-06-03 DIAGNOSIS — A419 Sepsis, unspecified organism: Secondary | ICD-10-CM | POA: Diagnosis present

## 2024-06-03 DIAGNOSIS — E872 Acidosis, unspecified: Secondary | ICD-10-CM | POA: Diagnosis present

## 2024-06-03 DIAGNOSIS — D849 Immunodeficiency, unspecified: Secondary | ICD-10-CM | POA: Diagnosis present

## 2024-06-03 DIAGNOSIS — Z86711 Personal history of pulmonary embolism: Secondary | ICD-10-CM | POA: Diagnosis not present

## 2024-06-03 DIAGNOSIS — Z888 Allergy status to other drugs, medicaments and biological substances status: Secondary | ICD-10-CM

## 2024-06-03 DIAGNOSIS — Z7952 Long term (current) use of systemic steroids: Secondary | ICD-10-CM

## 2024-06-03 DIAGNOSIS — D72829 Elevated white blood cell count, unspecified: Secondary | ICD-10-CM | POA: Diagnosis not present

## 2024-06-03 DIAGNOSIS — Z7960 Long term (current) use of unspecified immunomodulators and immunosuppressants: Secondary | ICD-10-CM

## 2024-06-03 DIAGNOSIS — C8590 Non-Hodgkin lymphoma, unspecified, unspecified site: Secondary | ICD-10-CM

## 2024-06-03 DIAGNOSIS — Z7901 Long term (current) use of anticoagulants: Secondary | ICD-10-CM | POA: Diagnosis not present

## 2024-06-03 DIAGNOSIS — N3001 Acute cystitis with hematuria: Secondary | ICD-10-CM

## 2024-06-03 DIAGNOSIS — Z8249 Family history of ischemic heart disease and other diseases of the circulatory system: Secondary | ICD-10-CM | POA: Diagnosis not present

## 2024-06-03 DIAGNOSIS — B961 Klebsiella pneumoniae [K. pneumoniae] as the cause of diseases classified elsewhere: Secondary | ICD-10-CM | POA: Diagnosis present

## 2024-06-03 DIAGNOSIS — N390 Urinary tract infection, site not specified: Secondary | ICD-10-CM | POA: Diagnosis present

## 2024-06-03 DIAGNOSIS — M5432 Sciatica, left side: Secondary | ICD-10-CM | POA: Diagnosis present

## 2024-06-03 DIAGNOSIS — G5792 Unspecified mononeuropathy of left lower limb: Secondary | ICD-10-CM | POA: Diagnosis present

## 2024-06-03 DIAGNOSIS — E860 Dehydration: Secondary | ICD-10-CM | POA: Diagnosis present

## 2024-06-03 DIAGNOSIS — E876 Hypokalemia: Secondary | ICD-10-CM | POA: Diagnosis present

## 2024-06-03 DIAGNOSIS — C8333 Diffuse large B-cell lymphoma, intra-abdominal lymph nodes: Secondary | ICD-10-CM | POA: Diagnosis not present

## 2024-06-03 DIAGNOSIS — M7989 Other specified soft tissue disorders: Secondary | ICD-10-CM | POA: Insufficient documentation

## 2024-06-03 DIAGNOSIS — G893 Neoplasm related pain (acute) (chronic): Secondary | ICD-10-CM | POA: Diagnosis present

## 2024-06-03 DIAGNOSIS — Z96611 Presence of right artificial shoulder joint: Secondary | ICD-10-CM | POA: Diagnosis present

## 2024-06-03 HISTORY — DX: Malignant (primary) neoplasm, unspecified: C80.1

## 2024-06-03 LAB — CBC WITH DIFFERENTIAL/PLATELET
Abs Immature Granulocytes: 2.74 K/uL — ABNORMAL HIGH (ref 0.00–0.07)
Basophils Absolute: 0.1 K/uL (ref 0.0–0.1)
Basophils Relative: 0 %
Eosinophils Absolute: 0 K/uL (ref 0.0–0.5)
Eosinophils Relative: 0 %
HCT: 31.6 % — ABNORMAL LOW (ref 36.0–46.0)
Hemoglobin: 10.5 g/dL — ABNORMAL LOW (ref 12.0–15.0)
Immature Granulocytes: 7 %
Lymphocytes Relative: 1 %
Lymphs Abs: 0.2 K/uL — ABNORMAL LOW (ref 0.7–4.0)
MCH: 31.8 pg (ref 26.0–34.0)
MCHC: 33.2 g/dL (ref 30.0–36.0)
MCV: 95.8 fL (ref 80.0–100.0)
Monocytes Absolute: 0.3 K/uL (ref 0.1–1.0)
Monocytes Relative: 1 %
Neutro Abs: 37.6 K/uL — ABNORMAL HIGH (ref 1.7–7.7)
Neutrophils Relative %: 91 %
Platelets: 336 K/uL (ref 150–400)
RBC: 3.3 MIL/uL — ABNORMAL LOW (ref 3.87–5.11)
RDW: 16.4 % — ABNORMAL HIGH (ref 11.5–15.5)
Smear Review: NORMAL
WBC: 41 K/uL — ABNORMAL HIGH (ref 4.0–10.5)
nRBC: 0 % (ref 0.0–0.2)

## 2024-06-03 LAB — COMPREHENSIVE METABOLIC PANEL WITH GFR
ALT: 10 U/L (ref 0–44)
AST: 16 U/L (ref 15–41)
Albumin: 3.9 g/dL (ref 3.5–5.0)
Alkaline Phosphatase: 63 U/L (ref 38–126)
Anion gap: 11 (ref 5–15)
BUN: 17 mg/dL (ref 8–23)
CO2: 23 mmol/L (ref 22–32)
Calcium: 8.9 mg/dL (ref 8.9–10.3)
Chloride: 104 mmol/L (ref 98–111)
Creatinine, Ser: 0.51 mg/dL (ref 0.44–1.00)
GFR, Estimated: >60 mL/min (ref >60)
Glucose, Bld: 120 mg/dL — ABNORMAL HIGH (ref 70–99)
Potassium: 3.4 mmol/L — ABNORMAL LOW (ref 3.5–5.1)
Sodium: 138 mmol/L (ref 135–145)
Total Bilirubin: 0.7 mg/dL (ref 0.0–1.2)
Total Protein: 6.4 g/dL — ABNORMAL LOW (ref 6.5–8.1)

## 2024-06-03 LAB — URINALYSIS, W/ REFLEX TO CULTURE (INFECTION SUSPECTED)
Bilirubin Urine: NEGATIVE
Glucose, UA: NEGATIVE mg/dL
Ketones, ur: NEGATIVE mg/dL
Nitrite: POSITIVE — AB
Protein, ur: 30 mg/dL — AB
RBC / HPF: >50 RBC/hpf (ref 0–5)
Specific Gravity, Urine: 1.013 (ref 1.005–1.030)
WBC, UA: >50 WBC/hpf (ref 0–5)
pH: 6 (ref 5.0–8.0)

## 2024-06-03 LAB — PROTIME-INR
INR: 1.2 (ref 0.8–1.2)
Prothrombin Time: 15.7 s — ABNORMAL HIGH (ref 11.4–15.2)

## 2024-06-03 LAB — LACTIC ACID, PLASMA
Lactic Acid, Venous: 2.4 mmol/L (ref 0.5–1.9)
Lactic Acid, Venous: 1.4 mmol/L (ref 0.5–1.9)

## 2024-06-03 MED ORDER — POLYETHYLENE GLYCOL 3350 17 G PO PACK
17.0000 g | PACK | Freq: Two times a day (BID) | ORAL | Status: DC
  Administered 2024-06-03 – 2024-06-06 (×4): 17 g via ORAL
  Filled 2024-06-03 (×5): qty 1

## 2024-06-03 MED ORDER — FENTANYL 25 MCG/HR TD PT72
1.0000 | MEDICATED_PATCH | TRANSDERMAL | Status: DC
  Administered 2024-06-03: 1 via TRANSDERMAL
  Filled 2024-06-03: qty 1

## 2024-06-03 MED ORDER — ENSURE PLUS HIGH PROTEIN PO LIQD
237.0000 mL | Freq: Two times a day (BID) | ORAL | Status: DC
  Administered 2024-06-04 – 2024-06-06 (×4): 237 mL via ORAL

## 2024-06-03 MED ORDER — SODIUM CHLORIDE 0.9 % IV BOLUS
1000.0000 mL | Freq: Once | INTRAVENOUS | Status: AC
  Administered 2024-06-03: 1000 mL via INTRAVENOUS

## 2024-06-03 MED ORDER — VANCOMYCIN HCL 1500 MG/300ML IV SOLN
1500.0000 mg | INTRAVENOUS | Status: DC
  Administered 2024-06-04: 1500 mg via INTRAVENOUS
  Filled 2024-06-03 (×2): qty 300

## 2024-06-03 MED ORDER — SODIUM CHLORIDE 0.9 % IV SOLN
2.0000 g | Freq: Three times a day (TID) | INTRAVENOUS | Status: DC
  Administered 2024-06-03 – 2024-06-06 (×8): 2 g via INTRAVENOUS
  Filled 2024-06-03 (×10): qty 12.5

## 2024-06-03 MED ORDER — LACTATED RINGERS IV SOLN: INTRAVENOUS | Status: AC

## 2024-06-03 MED ORDER — DULOXETINE HCL 30 MG PO CPEP
30.0000 mg | ORAL_CAPSULE | Freq: Every day | ORAL | Status: DC
  Administered 2024-06-04 – 2024-06-06 (×3): 30 mg via ORAL
  Filled 2024-06-03 (×3): qty 1

## 2024-06-03 MED ORDER — TAMSULOSIN HCL 0.4 MG PO CAPS
0.4000 mg | ORAL_CAPSULE | Freq: Every day | ORAL | Status: DC
  Administered 2024-06-04 – 2024-06-06 (×3): 0.4 mg via ORAL
  Filled 2024-06-03 (×3): qty 1

## 2024-06-03 MED ORDER — IOHEXOL 300 MG/ML  SOLN
100.0000 mL | Freq: Once | INTRAMUSCULAR | Status: AC | PRN
  Administered 2024-06-03: 100 mL via INTRAVENOUS

## 2024-06-03 MED ORDER — OXYCODONE HCL 5 MG PO TABS
10.0000 mg | ORAL_TABLET | ORAL | Status: DC | PRN
  Administered 2024-06-03 – 2024-06-05 (×4): 10 mg via ORAL
  Filled 2024-06-03 (×5): qty 2

## 2024-06-03 MED ORDER — LACTATED RINGERS IV BOLUS
1000.0000 mL | Freq: Once | INTRAVENOUS | Status: AC
  Administered 2024-06-03: 1000 mL via INTRAVENOUS

## 2024-06-03 MED ORDER — METHOCARBAMOL 500 MG PO TABS
500.0000 mg | ORAL_TABLET | Freq: Three times a day (TID) | ORAL | Status: DC | PRN
Start: 2024-06-03 — End: 2024-06-06
  Administered 2024-06-05: 500 mg via ORAL
  Filled 2024-06-03 (×2): qty 1

## 2024-06-03 MED ORDER — OXYBUTYNIN CHLORIDE ER 10 MG PO TB24
10.0000 mg | ORAL_TABLET | Freq: Every day | ORAL | Status: DC
  Administered 2024-06-04 – 2024-06-06 (×3): 10 mg via ORAL
  Filled 2024-06-03 (×3): qty 1

## 2024-06-03 MED ORDER — MORPHINE SULFATE (PF) 2 MG/ML IV SOLN
2.0000 mg | INTRAVENOUS | Status: DC | PRN
  Administered 2024-06-03 – 2024-06-06 (×14): 2 mg via INTRAVENOUS
  Filled 2024-06-03 (×14): qty 1

## 2024-06-03 MED ORDER — ACETAMINOPHEN 650 MG RE SUPP
650.0000 mg | Freq: Four times a day (QID) | RECTAL | Status: DC | PRN
Start: 2024-06-03 — End: 2024-06-06

## 2024-06-03 MED ORDER — PREGABALIN 75 MG PO CAPS
75.0000 mg | ORAL_CAPSULE | Freq: Two times a day (BID) | ORAL | Status: DC
  Administered 2024-06-03 – 2024-06-06 (×6): 75 mg via ORAL
  Filled 2024-06-03 (×6): qty 1

## 2024-06-03 MED ORDER — PREDNISONE 50 MG PO TABS
100.0000 mg | ORAL_TABLET | Freq: Every day | ORAL | Status: AC
  Administered 2024-06-04: 100 mg via ORAL
  Filled 2024-06-03: qty 2

## 2024-06-03 MED ORDER — ACYCLOVIR 200 MG PO CAPS
400.0000 mg | ORAL_CAPSULE | Freq: Every day | ORAL | Status: DC
  Administered 2024-06-04 – 2024-06-06 (×3): 400 mg via ORAL
  Filled 2024-06-03 (×3): qty 2

## 2024-06-03 MED ORDER — VANCOMYCIN HCL 1750 MG/350ML IV SOLN
1750.0000 mg | Freq: Once | INTRAVENOUS | Status: AC
  Administered 2024-06-03: 1750 mg via INTRAVENOUS
  Filled 2024-06-03: qty 350

## 2024-06-03 MED ORDER — SODIUM CHLORIDE 0.9 % IV SOLN
2.0000 g | Freq: Once | INTRAVENOUS | Status: AC
  Administered 2024-06-03: 2 g via INTRAVENOUS
  Filled 2024-06-03: qty 12.5

## 2024-06-03 MED ORDER — POTASSIUM CHLORIDE CRYS ER 20 MEQ PO TBCR
20.0000 meq | EXTENDED_RELEASE_TABLET | Freq: Every day | ORAL | Status: DC
  Administered 2024-06-03 – 2024-06-06 (×4): 20 meq via ORAL
  Filled 2024-06-03 (×4): qty 1

## 2024-06-03 MED ORDER — TRAZODONE HCL 50 MG PO TABS
50.0000 mg | ORAL_TABLET | Freq: Every evening | ORAL | Status: DC | PRN
  Administered 2024-06-04 – 2024-06-05 (×2): 50 mg via ORAL
  Filled 2024-06-03 (×2): qty 1

## 2024-06-03 MED ORDER — HYDROMORPHONE HCL 1 MG/ML IJ SOLN
0.5000 mg | Freq: Once | INTRAMUSCULAR | Status: AC
  Administered 2024-06-03: 0.5 mg via INTRAVENOUS
  Filled 2024-06-03: qty 0.5

## 2024-06-03 MED ORDER — ALLOPURINOL 100 MG PO TABS
300.0000 mg | ORAL_TABLET | Freq: Every day | ORAL | Status: DC
  Administered 2024-06-04 – 2024-06-06 (×3): 300 mg via ORAL
  Filled 2024-06-03 (×4): qty 3

## 2024-06-03 MED ORDER — ORAL CARE MOUTH RINSE: 15.0000 mL | OROMUCOSAL | Status: DC | PRN

## 2024-06-03 MED ORDER — MORPHINE SULFATE (PF) 4 MG/ML IV SOLN
4.0000 mg | Freq: Once | INTRAVENOUS | Status: AC
  Administered 2024-06-03: 4 mg via INTRAVENOUS
  Filled 2024-06-03: qty 1

## 2024-06-03 MED ORDER — ACETAMINOPHEN 325 MG PO TABS: 650.0000 mg | ORAL_TABLET | Freq: Four times a day (QID) | ORAL | Status: DC | PRN

## 2024-06-03 MED ORDER — APIXABAN 5 MG PO TABS
5.0000 mg | ORAL_TABLET | Freq: Two times a day (BID) | ORAL | Status: DC
  Administered 2024-06-03 – 2024-06-06 (×6): 5 mg via ORAL
  Filled 2024-06-03 (×6): qty 1

## 2024-06-03 MED ORDER — PANTOPRAZOLE SODIUM 40 MG PO TBEC
40.0000 mg | DELAYED_RELEASE_TABLET | Freq: Every day | ORAL | Status: DC
  Administered 2024-06-04 – 2024-06-06 (×3): 40 mg via ORAL
  Filled 2024-06-03 (×3): qty 1

## 2024-06-03 NOTE — Consult Note (Signed)
 CODE SEPSIS - PHARMACY COMMUNICATION  **Broad Spectrum Antibiotics should be administered within 1 hour of Sepsis diagnosis**  Time Code Sepsis Called/Page Received: 1221  Antibiotics Ordered: cefepime   Time of 1st antibiotic administration: 1231  Additional action taken by pharmacy: N/A   Kayla JULIANNA Blew ,PharmD Clinical Pharmacist  06/03/2024  12:27 PM

## 2024-06-03 NOTE — Progress Notes (Signed)
 Patient admitted to PCU from the ED. Upon arrival, patient placed on continuous cardiac telemetry monitoring. Vital signs obtained and documented. Patient oriented to room, call bell system, and unit routines. Safety measures implemented: bed in lowest position, call bell in reach, and side rails up x3. Patient repositioned for comfort and skin protection. Initial head-to-toes assessment completed. Patient resting comfortably in bed, no acute distress noted at this time.

## 2024-06-03 NOTE — ED Notes (Signed)
 At this time, this EDT rounded on pt, high fall risk bracelet and bed alarm is on. Pt is resting in bed, call light within reach.

## 2024-06-03 NOTE — ED Notes (Signed)
 At this time, this EDT answered a call light from this pt, pt needed to go on the bed pan, this EDT put pt on the bed pan, provided privacy and peri care. Pt is now resting in bed, call light within reach.

## 2024-06-03 NOTE — ED Provider Notes (Signed)
 [facility] Provider Note    Event Date/Time   First MD Initiated Contact with Patient 06/03/24 1117     (approximate)   History   Shortness of Breath and Dizziness   HPI  Stacey [name] is a 63 y.o. female who presents today with concern of possible infection.  Recently diagnosed with lymphoma, and started chemotherapy about 2 to 3 days out from her second cycle of treatment with apparent poor response.  I reviewed her outpatient oncology notes where she ended up getting IV fluids in clinic and having poor p.o. tolerance.  Yesterday after receiving the fluid she did feel better, however this morning woke up with chills fatigue nausea and burning with urination.  Apparently she had a recent admission with a similar episode at that time she was found to be febrile as well.  She states that this feels very much like that.  Has not taken any of her medications this morning to help with her symptoms.      Physical Exam   Triage Vital Signs: ED Triage Vitals  Encounter Vitals Group     BP 06/03/24 1055 126/73     Girls Systolic BP Percentile --      Girls Diastolic BP Percentile --      Boys Systolic BP Percentile --      Boys Diastolic BP Percentile --      Pulse Rate 06/03/24 1055 91     Resp 06/03/24 1055 (!) 22     Temp 06/03/24 1055 98.2 F (36.8 C)     Temp src --      SpO2 06/03/24 1055 98 %     Weight 06/03/24 1056 158 lb (71.7 kg)     Height 06/03/24 1056 5' 4 (1.626 m)     Head Circumference --      Peak Flow --      Pain Score 06/03/24 1056 5     Pain Loc --      Pain Education --      Exclude from Growth Chart --     Most recent vital signs: Vitals:   06/03/24 1415 06/03/24 1437  BP:  (!) 147/79  Pulse: 66 65  Resp: (!) 30 (!) 28  Temp:  99.1 F (37.3 C)  SpO2: 98% 100%     General: Awake, no distress.  CV:  Good peripheral perfusion.  Resp:  Normal effort.  Abd:  No distention.  Soft generalized discomfort to  palpation Other:     ED Results / Procedures / Treatments   Labs (all labs ordered are listed, but only abnormal results are displayed) Labs Reviewed  COMPREHENSIVE METABOLIC PANEL WITH GFR - Abnormal; Notable for the following components:      Result Value   Potassium 3.4 (*)    Glucose, Bld 120 (*)    Total Protein 6.4 (*)    All other components within normal limits  LACTIC ACID, PLASMA - Abnormal; Notable for the following components:   Lactic Acid, Venous 2.4 (*)    All other components within normal limits  CBC WITH DIFFERENTIAL/PLATELET - Abnormal; Notable for the following components:   WBC 41.0 (*)    RBC 3.30 (*)    Hemoglobin 10.5 (*)    HCT 31.6 (*)    RDW 16.4 (*)    Neutro Abs 37.6 (*)    Lymphs Abs 0.2 (*)    Abs Immature Granulocytes 2.74 (*)    All other components within  normal limits  PROTIME-INR - Abnormal; Notable for the following components:   Prothrombin Time 15.7 (*)    All other components within normal limits  URINALYSIS, W/ REFLEX TO CULTURE (INFECTION SUSPECTED) - Abnormal; Notable for the following components:   Color, Urine YELLOW (*)    APPearance CLOUDY (*)    Hgb urine dipstick LARGE (*)    Protein, ur 30 (*)    Nitrite POSITIVE (*)    Leukocytes,Ua LARGE (*)    Bacteria, UA FEW (*)    All other components within normal limits  CULTURE, BLOOD (ROUTINE X 2)  CULTURE, BLOOD (ROUTINE X 2)  URINE CULTURE  LACTIC ACID, PLASMA     EKG  Sinus rhythm with rate of about 90, axis of 70, intervals appear to be within normal limits, no obvious ischemia and I appreciate this EKG   RADIOLOGY No acute findings appreciated on chest x-ray  PROCEDURES:  Critical Care performed: Yes, see critical care procedure note(s)  .Critical Care  Performed by: Fernand Rossie HERO, MD Authorized by: Fernand Rossie HERO, MD   Critical care provider statement:    Critical care time (minutes):  30   Critical care was time spent personally by me on the  following activities:  Development of treatment plan with patient or surrogate, discussions with consultants, evaluation of patient's response to treatment, examination of patient, ordering and review of laboratory studies, ordering and review of radiographic studies, ordering and performing treatments and interventions, pulse oximetry, re-evaluation of patient's condition and review of old charts    MEDICATIONS ORDERED IN ED: Medications  lactated ringers  infusion ( Intravenous New Bag/Given 06/03/24 1454)  vancomycin  (VANCOREADY) IVPB 1750 mg/350 mL (1,750 mg Intravenous New Bag/Given 06/03/24 1457)    Followed by  vancomycin  (VANCOREADY) IVPB 1500 mg/300 mL (has no administration in time range)  morphine  (PF) 4 MG/ML injection 4 mg (4 mg Intravenous Given 06/03/24 1231)  ceFEPIme  (MAXIPIME ) 2 g in sodium chloride  0.9 % 100 mL IVPB (0 g Intravenous Stopped 06/03/24 1327)  iohexol  (OMNIPAQUE ) 300 MG/ML solution 100 mL (100 mLs Intravenous Contrast Given 06/03/24 1308)  sodium chloride  0.9 % bolus 1,000 mL (1,000 mLs Intravenous New Bag/Given 06/03/24 1333)  HYDROmorphone  (DILAUDID ) injection 0.5 mg (0.5 mg Intravenous Given 06/03/24 1339)  lactated ringers  bolus 1,000 mL (1,000 mLs Intravenous New Bag/Given 06/03/24 1454)     IMPRESSION / MDM / ASSESSMENT AND PLAN / ED COURSE  I reviewed the triage vital signs and the nursing notes.                               Patient's presentation is most consistent with acute presentation with potential threat to life or bodily function.  63 year old female underlying lymphoma recently received treatment presenting today with concern of fevers chills abdominal pain fatigue.  Here initial vitals are reassuring, but she does appear unwell, recent admission for similar symptoms.  Concern of possible neutropenic fever versus underlying sepsis.  I have initiated the septic workup here will follow-up labs and imaging and determine workup accordingly.   Unfortunately given her persistent difficulty with p.o. tolerance, may just benefit from admission for IV hydration.   Clinical Course as of 06/03/24 1500  Sat Jun 03, 2024  1302 Significant leukocytosis which may be an reaction to the chemotherapy from the lymphoma regardless started antibiotics likely secondary to urinary tract infection.  And awaiting results of CT imaging with anticipated admission afterwards. [SK]  Clinical Course User Index [SK] Fernand Rossie HERO, MD     FINAL CLINICAL IMPRESSION(S) / ED DIAGNOSES   Final diagnoses:  Sepsis with acute organ dysfunction without septic shock, due to unspecified organism, unspecified organ dysfunction type (HCC)  Acute cystitis without hematuria  Lymphoma, unspecified body region, unspecified lymphoma type (HCC)     Rx / DC Orders   ED Discharge Orders     None        Note:  This document was prepared using Dragon voice recognition software and may include unintentional dictation errors.   Fernand Rossie HERO, MD 06/03/24 [phone]

## 2024-06-03 NOTE — Progress Notes (Signed)
 Pharmacy Antibiotic Note  Stacey [name] is a 63 y.o. female admitted on 06/03/2024 with UTI.  Patient has a history of enterococcus UTI. Pharmacy has been consulted for vancomycin  dosing.  Plan: Give vancomycin  1750 mg IV x1 followed by 1500 mg IV Q24H. Goal AUC 400-550. Expected AUC: 506.8 Expected Css min: 10.7 SCr used: 0.8  Weight used: IBW, Vd used: 0.72 (BMI 27.12) Continue to monitor renal function and follow culture results   Height: 5' 4 (162.6 cm) Weight: 71.7 kg (158 lb) IBW/kg (Calculated) : 54.7  Temp (24hrs), Avg:98.2 F (36.8 C), Min:98.2 F (36.8 C), Max:98.2 F (36.8 C)  Recent Labs  Lab 05/31/24 0818 06/02/24 1059 06/03/24 1058  WBC 4.2 6.5 41.0*  CREATININE 0.57 0.59 0.51  LATICACIDVEN  --   --  2.4*    Estimated Creatinine Clearance: 71.7 mL/min (by C-G formula based on SCr of 0.51 mg/dL).    Allergies  Allergen Reactions   Propofol  Nausea Only    Patient states medication can be tolerated with an antiemetic before taking    Antimicrobials this admission: 11/22 cefepime  x 1 11/22 vanc >>   Microbiology results: 11/22 BCx: IP 11/22 UCx: IP   Thank you for allowing pharmacy to be a part of this patient's care.  Lum VEAR Mania, PharmD, BCPS 06/03/2024 2:12 PM

## 2024-06-03 NOTE — Plan of Care (Signed)

## 2024-06-03 NOTE — H&P (Signed)
 History and Physical    Stacey [name] FMW:[id number] DOB: [birth year] DOA: 06/03/2024  PCP: [name], PA-C  Patient coming from: home  I have personally briefly reviewed patient's old medical records in [facility]  Chief Complaint: lightheadedness, dysuria  HPI: Stacey [name] is Stacey [name] 63 y.o. female with medical history significant of diffuse large B cell lymphoma with an obstructing mass along L pelvic side wall causing hydroureter/hydronephrosis requiring stent placement by urology (04/08/2024), pulmonary embolism, recent hospitalization for sepsis related to Shaheer Bonfield UTI and multiple other medical issues here with lightheadedness and dysuria.   She underwent her second cycle of chemotherapy 11/19.  She got GCSF yesterday and IVF with concern for dehydration.  Last night, she woke up around 2-3 am and felt dizzy (like she was going to faint), weak, and lightheaded.  Also noticed dysuria and nausea.  Due to her symptoms, she told her husband she needed to come to the [name].  She felt like she was going to pass out.  Since being here, pain meds have helped with suprapubic discomfort/dysuria.  Her lightheadedness is better after IVF, but still present.  She denies fevers, notes chills.  No cough/cold.  Notes what she describes as breathing heavy, but no SOB.  No CP.  Denies smoking, drinking.    ED Course: Labs, imaging.  Antibiotics.  Admit to hospitalist for sepsis due to UTI.    Review of Systems: As per HPI otherwise all other systems reviewed and are negative.  Past Medical History:  Diagnosis Date   Arthritis    left shoulder, neck, lower back  (01/11/2018)   Cancer (HCC)    Chronic lower back pain    Family history of adverse reaction to anesthesia    Mother has nausea   GERD (gastroesophageal reflux disease)    PONV (postoperative nausea and vomiting)    Pre-diabetes    UTI (urinary tract infection) 03/20/2021    Past Surgical History:  Procedure Laterality  Date   APPENDECTOMY     CYSTOSCOPY W/ URETERAL STENT PLACEMENT Left 04/08/2024   Procedure: CYSTOSCOPY, WITH RETROGRADE PYELOGRAM AND URETERAL STENT INSERTION;  Surgeon: Watt Rush, MD;  Location: [facility] ORS;  Service: Urology;  Laterality: Left;   IR IMAGING GUIDED PORT INSERTION  05/01/2024   JOINT REPLACEMENT     TOTAL SHOULDER ARTHROPLASTY Left 01/11/2018   TOTAL SHOULDER ARTHROPLASTY Left 01/11/2018   Procedure: LEFT TOTAL SHOULDER ARTHROPLASTY;  Surgeon: Sharl Selinda Dover, MD;  Location: [name];  Service: Orthopedics;  Laterality: Left;  2.5 hrs   TOTAL SHOULDER ARTHROPLASTY Right 02/08/2020   Procedure: TOTAL SHOULDER ARTHROPLASTY;  Surgeon: Sharl Selinda Dover, MD;  Location: [facility] OR;  Service: Orthopedics;  Laterality: Right;  2.5 hrs RNFA    Social History  reports that she has never smoked. She has never used smokeless tobacco. She reports that she does not currently use alcohol. She reports that she does not use drugs.  Allergies  Allergen Reactions   Propofol  Nausea Only    Patient states medication can be tolerated with an antiemetic before taking    Family History  Problem Relation Age of Onset   Hypertension Mother    Heart disease Father    Heart attack Father    CAD Father    Colon cancer Neg Hx    Esophageal cancer Neg Hx    Rectal cancer Neg Hx    Stomach cancer Neg Hx    Prior to Admission medications   Medication Sig Start  Date End Date Taking? Authorizing Provider  acyclovir  (ZOVIRAX ) 400 MG tablet Take 1 tablet (400 mg total) by mouth daily. 05/01/24   Babara Call, MD  allopurinol  (ZYLOPRIM ) 300 MG tablet Take 1 tablet (300 mg total) by mouth daily. 05/01/24   Babara Call, MD  amoxicillin  (AMOXIL ) 500 MG capsule Take 2 capsules (1,000 mg total) by mouth 3 (three) times daily for 3 days. 05/24/24 06/04/24  Franchot Novel, MD  apixaban  (ELIQUIS ) 5 MG TABS tablet Take 2 tablets (10 mg total) by mouth 2 (two) times daily for 3 days, THEN 1 tablet (5 mg total)  2 (two) times daily. 05/24/24 08/25/24  Franchot Novel, MD  DULoxetine  (CYMBALTA ) 30 MG capsule Take 1 capsule (30 mg total) by mouth daily. 05/31/24   Borders, Fonda SAUNDERS, NP  fentaNYL  (DURAGESIC ) 25 MCG/HR Place 1 patch onto the skin every 3 (three) days. 05/12/24   Babara Call, MD  lidocaine -prilocaine  (EMLA ) cream Apply to affected area once 05/01/24   Babara Call, MD  loratadine  (CLARITIN ) 10 MG tablet Take 10 mg by mouth at bedtime.    [provider]  magic mouthwash (multi-ingredient) oral suspension Swish and swallow 5-10 mLs 4 (four) times daily as needed for mouth pain. 05/16/24   Babara Call, MD  methocarbamol  (ROBAXIN ) 500 MG tablet Take 500 mg by mouth every 8 (eight) hours as needed for muscle spasms. 10/15/17   [provider]  omeprazole  (PRILOSEC) 20 MG capsule TAKE 1 CAPSULE (20 MG TOTAL) BY MOUTH 2 (TWO) TIMES DAILY BEFORE Ginny Loomer MEAL. 01/11/24   Legrand Victory LITTIE DOUGLAS, MD  ondansetron  (ZOFRAN ) 8 MG tablet Take 1 tablet (8 mg total) by mouth every 8 (eight) hours as needed for nausea or vomiting. Start on the third day after cyclophosphamide  chemotherapy. 05/01/24   Babara Call, MD  oxybutynin  (DITROPAN -XL) 10 MG 24 hr tablet TAKE 1 TABLET BY MOUTH EVERY DAY 05/29/24   Francisca Redell BROCKS, MD  Oxycodone  HCl 10 MG TABS Take 1 tablet (10 mg total) by mouth every 4 (four) hours as needed (pain). 05/31/24   Borders, Fonda SAUNDERS, NP  potassium chloride  SA (KLOR-CON  M) 20 MEQ tablet Take 1 tablet (20 mEq total) by mouth daily. 05/31/24   Babara Call, MD  predniSONE  (DELTASONE ) 20 MG tablet Take 5 tablets (100 mg total) by mouth as directed. Take with food on days 2-5 of chemotherapy. 05/31/24   Babara Call, MD  pregabalin  (LYRICA ) 75 MG capsule Take 1 capsule (75 mg total) by mouth 2 (two) times daily. 06/02/24   Vaslow, Zachary K, MD  prochlorperazine  (COMPAZINE ) 10 MG tablet Take 1 tablet (10 mg total) by mouth every 6 (six) hours as needed for nausea or vomiting. 05/01/24   Babara Call, MD  sodium phosphate   (FLEET) ENEM Place 133 mLs (1 enema total) rectally daily as needed for severe constipation. Patient not taking: Reported on 06/02/2024 04/08/24   Waymond Lorelle Cummins, MD  tamsulosin  (FLOMAX ) 0.4 MG CAPS capsule TAKE 1 CAPSULE BY MOUTH EVERY DAY 05/29/24   Francisca Redell BROCKS, MD  traZODone  (DESYREL ) 50 MG tablet Take 1 tablet (50 mg total) by mouth at bedtime as needed for sleep. 05/31/24   Babara Call, MD    Physical Exam: Vitals:   06/03/24 1350 06/03/24 1400 06/03/24 1415 06/03/24 1437  BP:  (!) 141/79  (!) 147/79  Pulse: 84 63 66 65  Resp: (!) 30 (!) 32 (!) 30 (!) 28  Temp:    99.1 F (37.3 C)  TempSrc:  Oral  SpO2: 97% 96% 98% 100%  Weight:      Height:        Constitutional: NAD, calm, comfortable Vitals:   06/03/24 1350 06/03/24 1400 06/03/24 1415 06/03/24 1437  BP:  (!) 141/79  (!) 147/79  Pulse: 84 63 66 65  Resp: (!) 30 (!) 32 (!) 30 (!) 28  Temp:    99.1 F (37.3 C)  TempSrc:    Oral  SpO2: 97% 96% 98% 100%  Weight:      Height:       Eyes: PERRL ENMT: Mucous membranes are moist Neck: normal, supple Respiratory: clear to auscultation bilaterally, no wheezing, no crackles. Normal respiratory effort. Cardiovascular: RRR, bilateral LE edema Abdomen: suprapubic TTP, L>R CVA tenderness Musculoskeletal: no clubbing / cyanosis. No joint deformity upper and lower extremities. Good ROM, no contractures. Normal muscle tone.  Skin: no rashes, lesions, ulcers. No induration Neurologic: CN 2-12 grossly intact. Sensation intact, DTR normal. Strength 5/5 in all 4.  Psychiatric: Normal judgment and insight. Alert and oriented x 3. Normal mood.   Labs on Admission: I have personally reviewed following labs and imaging studies  CBC: Recent Labs  Lab 05/31/24 0818 06/02/24 1059 06/03/24 1058  WBC 4.2 6.5 41.0*  NEUTROABS 2.7 5.8 37.6*  HGB 11.3* 10.4* 10.5*  HCT 33.7* 30.5* 31.6*  MCV 93.4 93.0 95.8  PLT 426* 392 336    Basic Metabolic Panel: Recent Labs  Lab 05/31/24 0818  06/02/24 1059 06/03/24 1058  NA 136 134* 138  K 3.4* 3.6 3.4*  CL 101 101 104  CO2 25 23 23   GLUCOSE 138* 139* 120*  BUN 11 24* 17  CREATININE 0.57 0.59 0.51  CALCIUM 8.9 8.7* 8.9    GFR: Estimated Creatinine Clearance: 71.7 mL/min (by C-G formula based on SCr of 0.51 mg/dL).  Liver Function Tests: Recent Labs  Lab 05/31/24 0818 06/02/24 1059 06/03/24 1058  AST 21 15 16   ALT 14 11 10   ALKPHOS 49 41 63  BILITOT 0.5 0.5 0.7  PROT 6.9 6.4* 6.4*  ALBUMIN 3.7 3.5 3.9    Urine analysis:    Component Value Date/Time   COLORURINE YELLOW (Unnamed Zeien) 06/03/2024 1058   APPEARANCEUR CLOUDY (Nazareth Kirk) 06/03/2024 1058   APPEARANCEUR Clear 09/18/2012 1203   LABSPEC 1.013 06/03/2024 1058   LABSPEC 1.013 09/18/2012 1203   PHURINE 6.0 06/03/2024 1058   GLUCOSEU NEGATIVE 06/03/2024 1058   GLUCOSEU Negative 09/18/2012 1203   HGBUR LARGE (Tasheka Houseman) 06/03/2024 1058   BILIRUBINUR NEGATIVE 06/03/2024 1058   BILIRUBINUR Negative 09/18/2012 1203   KETONESUR NEGATIVE 06/03/2024 1058   PROTEINUR 30 (Kaiden Pech) 06/03/2024 1058   NITRITE POSITIVE (Kailany Dinunzio) 06/03/2024 1058   LEUKOCYTESUR LARGE (Marshawn Ninneman) 06/03/2024 1058   LEUKOCYTESUR 1+ 09/18/2012 1203    Radiological Exams on Admission: CT ABDOMEN PELVIS W CONTRAST Result Date: 06/03/2024 CLINICAL DATA:  Generalized abdominal pain. EXAM: CT ABDOMEN AND PELVIS WITH CONTRAST TECHNIQUE: Multidetector CT imaging of the abdomen and pelvis was performed using the standard protocol following bolus administration of intravenous contrast. RADIATION DOSE REDUCTION: This exam was performed according to the departmental dose-optimization program which includes automated exposure control, adjustment of the mA and/or kV according to patient size and/or use of iterative reconstruction technique. CONTRAST:  OMNIPAQUE  IOHEXOL  300 MG/ML  SOLN COMPARISON:  CT abdomen pelvis dated 05/17/2024 FINDINGS: Lower chest: The visualized lung bases are clear. No intra-abdominal free air or free fluid.  Hepatobiliary: No focal liver abnormality is seen. No gallstones, gallbladder wall thickening, or  biliary dilatation. Pancreas: Unremarkable. No pancreatic ductal dilatation or surrounding inflammatory changes. Spleen: Normal in size without focal abnormality. Adrenals/Urinary Tract: The adrenal glands unremarkable. Left-sided pigtail ureteral catheter with tip in the left renal inferior pole collecting system and distal end in the urinary bladder. Mild left hydronephrosis similar or minimally decreased. There is enhancement of the left urothelium suggestive of ascending UTI. The right kidney is unremarkable. The right ureter and urinary bladder appear unremarkable. Stomach/Bowel: Moderate stool throughout the colon. There is no bowel obstruction or active inflammation. Appendectomy. Vascular/Lymphatic: The abdominal aorta and IVC unremarkable. No portal venous gas. There is no adenopathy. Reproductive: The uterus is grossly unremarkable no suspicious adnexal masses. Other: None Musculoskeletal: Degenerative changes of the spine. No acute osseous pathology. IMPRESSION: 1. Left-sided pigtail ureteral catheter with similar or minimally decreased mild left hydronephrosis. 2. Enhancement of the left urothelium suggestive of ascending UTI. Correlation with urinalysis recommended. 3. No bowel obstruction. Electronically Signed   By: Vanetta Chou M.D.   On: 06/03/2024 13:30   DG Chest Port 1 View Result Date: 06/03/2024 CLINICAL DATA:  Questionable sepsis - evaluate for abnormality EXAM: PORTABLE CHEST 1 VIEW COMPARISON:  May 17, 2024 FINDINGS: The cardiomediastinal silhouette is unchanged in contour.RIGHT chest port with tip terminating over the SVC. No pleural effusion. No pneumothorax. No acute pleuroparenchymal abnormality. IMPRESSION: No acute cardiopulmonary abnormality. Electronically Signed   By: Corean Salter M.D.   On: 06/03/2024 11:56    EKG: Independently reviewed. Sinus, poor quality  EKG  Assessment/Plan Principal Problem:   Sepsis secondary to UTI Stacey [name]) Active Problems:   Sepsis (HCC)    Assessment and Plan:  Sepsis due to Darivs Lunden UTI Immunosuppressed in setting of chemo for her lymphoma Meets sepsis criteria with leukocytosis, tachypnea  UA with LE/nitrite +, >50 RBC, > 50 WBC - consistent with UTI, follow urine culture CT with mild L hydro and left sided pigtail catheter - enhancement of L urothelium c/w ascending UTI CXR without acute cardiopulm abnormality Blood cultures pending Previous urine culture with enterococcus, will continue abx coverage with cefepime  + vanc, follow urine culture and narrow as appropriate Will discuss stent with urology.  Dr. Sherrilee recommend completing Aldean Pipe course of culture specific antibiotics, followed by prophylaxis.  Note, she has appt with Dr. Francisca on 11/25.    Leukocytosis Multifactorial in setting of receiving pegfilgrastim -jmdb on 11/21, high dose steroids, and infection above  Lightheadedness Suspect related to sepsis above - she's not hypotensive Follow with IVF Will hold off on orthostatics for now, she's symptomatic in bed Echo 04/2024 with EF 55-60%, grade 1 diastolic dysfunction, normal RVSF Consider additional workup if persistent  Tachypnea She noted heavy breathing, but no real SOB - suspect related to sepsis above CXR without notable abnormalities Follow additional w/u if persistent symptoms  Diffuse Large B Cell Lymphoma Cancer Related Pain S/p cycle 2 R dose reduced CHP with D3 GCSF (Wednesday, 11/19) Will discuss with oncology whether to give prednisone  11/23 - Dr. Babara recommended giving as long as hemodynamically stable Allopurinol , acyclovir   Fentanyl  patch, oxycodone   Neuropathy Left Lower Extremity Weakness Had MRI 11/19 without metastatic disease in lumbar spine, diffuse disc and facet degeneration with mild to moderate spinal stenosis at L3-4, mild lateral recess and neural foraminal stenosis at  L2-3 and L4-5.  Mild L hydro.  Saw Dr. Buckley, 11/21 who thought her symptoms were related to sciatica + vincristine  chemo (which has now been discontinued) Lyrica , cymbalta  Planning for therapy outpatient, neuro oncology to consider  EMG if symptoms worsen  Left Pelvic Mass  Mild Left Hydronephrosis s/p Stent Placement S/p cystoscopy and insertion of L double J stent on 04/08/2024 As above, will discuss stent with urology Flomax , oxybutynin  Path from 9/29 with large B cell lymphoma  Age Indeterminate Pulmonary Embolus Noted 05/18/2024 CT scan Continue eliquis    Lower Extremity Edema Seems to be chronic Follow   Hypokalemia Replace, follow  Hyperglycemia A1c 03/2024 was 5.5 Suspect related to steroids, follow   GERD PPI     DVT prophylaxis: eliquis   Code Status:   full  Family Communication:  Husband at bedside  Disposition Plan:   Patient is from:  home  Anticipated DC to:  home  Anticipated DC date:  pending  Anticipated DC barriers: Pending improvement  Consults called:  Oncology, urology  Admission status:  inpatient   Severity of Illness: The appropriate patient status for this patient is INPATIENT. Inpatient status is judged to be reasonable and necessary in order to provide the required intensity of service to ensure the patient's safety. The patient's presenting symptoms, physical exam findings, and initial radiographic and laboratory data in the context of their chronic comorbidities is felt to place them at high risk for further clinical deterioration. Furthermore, it is not anticipated that the patient will be medically stable for discharge from the [name] within 2 midnights of admission.   * I certify that at the point of admission it is my clinical judgment that the patient will require inpatient [name] care spanning beyond 2 midnights from the point of admission due to high intensity of service, high risk for further deterioration and high frequency of  surveillance required.DEWAINE Meliton Monte MD [facility]  How to contact the [facility] Attending or Consulting provider 7A - 7P or covering provider during after hours 7P -7A, for this patient?   Check the care team in [facility] and look for Oliver Heitzenrater) attending/consulting [facility] provider listed and b) the [facility] team listed Log into [email or web address] and use [place]'s universal password to access. If you do not have the password, please contact the [name] operator. Locate the [facility] provider you are looking for under [facility] and page to Kerianne Gurr number that you can be directly reached. If you still have difficulty reaching the provider, please page the [place] [name] (Director on Call) for the Hospitalists listed on amion for assistance.  06/03/2024, 3:04 PM

## 2024-06-03 NOTE — Plan of Care (Addendum)
 This patient remains on AR-2A as of time of writing. The patient is AA+Ox4 and has no supplement al O2 requirement at this time. The patient is continuing to receive MIVF via PIV; PAC is not accessed at this time. Nutritional supplemental ordered per BPA. The patient's admission profile is completed overnight by this RN. Oral care protocol initiated overnight as well.    Problem: Education: Goal: Knowledge of General Education information will improve Description: Including pain rating scale, medication(s)/side effects and non-pharmacologic comfort measures Outcome: Progressing   Problem: Health Behavior/Discharge Planning: Goal: Ability to manage health-related needs will improve Outcome: Progressing   Problem: Clinical Measurements: Goal: Ability to maintain clinical measurements within normal limits will improve Outcome: Progressing Goal: Will remain free from infection Outcome: Progressing Goal: Diagnostic test results will improve Outcome: Progressing Goal: Respiratory complications will improve Outcome: Progressing Goal: Cardiovascular complication will be avoided Outcome: Progressing   Problem: Activity: Goal: Risk for activity intolerance will decrease Outcome: Progressing   Problem: Nutrition: Goal: Adequate nutrition will be maintained Outcome: Progressing   Problem: Coping: Goal: Level of anxiety will decrease Outcome: Progressing   Problem: Elimination: Goal: Will not experience complications related to bowel motility Outcome: Progressing Goal: Will not experience complications related to urinary retention Outcome: Progressing   Problem: Pain Managment: Goal: General experience of comfort will improve and/or be controlled Outcome: Progressing   Problem: Safety: Goal: Ability to remain free from injury will improve Outcome: Progressing   Problem: Skin Integrity: Goal: Risk for impaired skin integrity will decrease Outcome: Progressing   Problem:  Education: Goal: Knowledge of the prescribed therapeutic regimen will improve Outcome: Progressing   Problem: Activity: Goal: Ability to implement measures to reduce episodes of fatigue will improve Outcome: Progressing   Problem: Bowel/Gastric: Goal: Will not experience complications related to bowel motility Outcome: Progressing   Problem: Coping: Goal: Ability to identify and develop effective coping behavior will improve Outcome: Progressing   Problem: Nutritional: Goal: Maintenance of adequate nutrition will improve Outcome: Progressing   Problem: Fluid Volume: Goal: Hemodynamic stability will improve Outcome: Progressing   Problem: Clinical Measurements: Goal: Diagnostic test results will improve Outcome: Progressing Goal: Signs and symptoms of infection will decrease Outcome: Progressing   Problem: Respiratory: Goal: Ability to maintain adequate ventilation will improve Outcome: Progressing

## 2024-06-03 NOTE — ED Notes (Signed)
 Pt transported to CT at this time.

## 2024-06-03 NOTE — ED Notes (Signed)
 Pt reports feeling like her bladder is not able to empty completely. MD made aware.

## 2024-06-03 NOTE — ED Triage Notes (Signed)
 Pt to ED for generalized weakness, shob, chills, dysuria started this am. Labored breathing noted. Had chemo on Wednesday for lymphoma

## 2024-06-03 NOTE — Sepsis Progress Note (Signed)
 Elink following code sepsis

## 2024-06-04 DIAGNOSIS — N390 Urinary tract infection, site not specified: Secondary | ICD-10-CM | POA: Diagnosis not present

## 2024-06-04 DIAGNOSIS — Z86711 Personal history of pulmonary embolism: Secondary | ICD-10-CM | POA: Insufficient documentation

## 2024-06-04 DIAGNOSIS — B952 Enterococcus as the cause of diseases classified elsewhere: Secondary | ICD-10-CM | POA: Diagnosis not present

## 2024-06-04 DIAGNOSIS — C8333 Diffuse large B-cell lymphoma, intra-abdominal lymph nodes: Secondary | ICD-10-CM | POA: Diagnosis not present

## 2024-06-04 DIAGNOSIS — R29898 Other symptoms and signs involving the musculoskeletal system: Secondary | ICD-10-CM | POA: Insufficient documentation

## 2024-06-04 DIAGNOSIS — R6520 Severe sepsis without septic shock: Secondary | ICD-10-CM

## 2024-06-04 DIAGNOSIS — D72829 Elevated white blood cell count, unspecified: Secondary | ICD-10-CM

## 2024-06-04 DIAGNOSIS — A419 Sepsis, unspecified organism: Secondary | ICD-10-CM | POA: Diagnosis not present

## 2024-06-04 DIAGNOSIS — N3000 Acute cystitis without hematuria: Secondary | ICD-10-CM

## 2024-06-04 DIAGNOSIS — C8590 Non-Hodgkin lymphoma, unspecified, unspecified site: Secondary | ICD-10-CM | POA: Diagnosis not present

## 2024-06-04 LAB — COMPREHENSIVE METABOLIC PANEL WITH GFR
ALT: 7 U/L (ref 0–44)
AST: 13 U/L — ABNORMAL LOW (ref 15–41)
Albumin: 3.1 g/dL — ABNORMAL LOW (ref 3.5–5.0)
Alkaline Phosphatase: 65 U/L (ref 38–126)
Anion gap: 5 (ref 5–15)
BUN: 12 mg/dL (ref 8–23)
CO2: 26 mmol/L (ref 22–32)
Calcium: 8.3 mg/dL — ABNORMAL LOW (ref 8.9–10.3)
Chloride: 106 mmol/L (ref 98–111)
Creatinine, Ser: 0.39 mg/dL — ABNORMAL LOW (ref 0.44–1.00)
GFR, Estimated: >60 mL/min (ref >60)
Glucose, Bld: 91 mg/dL (ref 70–99)
Potassium: 3.8 mmol/L (ref 3.5–5.1)
Sodium: 137 mmol/L (ref 135–145)
Total Bilirubin: 0.5 mg/dL (ref 0.0–1.2)
Total Protein: 5.1 g/dL — ABNORMAL LOW (ref 6.5–8.1)

## 2024-06-04 LAB — CBC
HCT: 26.3 % — ABNORMAL LOW (ref 36.0–46.0)
Hemoglobin: 8.7 g/dL — ABNORMAL LOW (ref 12.0–15.0)
MCH: 31.9 pg (ref 26.0–34.0)
MCHC: 33.1 g/dL (ref 30.0–36.0)
MCV: 96.3 fL (ref 80.0–100.0)
Platelets: 216 K/uL (ref 150–400)
RBC: 2.73 MIL/uL — ABNORMAL LOW (ref 3.87–5.11)
RDW: 16.6 % — ABNORMAL HIGH (ref 11.5–15.5)
WBC: 35 K/uL — ABNORMAL HIGH (ref 4.0–10.5)
nRBC: 0 % (ref 0.0–0.2)

## 2024-06-04 NOTE — Progress Notes (Signed)
 OT Cancellation Note  Patient Details Name:  Carmack MRN: [id number] DOB: 14-Jul-1960   Cancelled Treatment:    Reason Eval/Treat Not Completed: Fatigue/lethargy limiting ability to participate. Chart reviewed. Attempted x3 this date, pt reporting fatigue/pain and declines but agreeable next date. Will hold and initiate services as available.   Elston Slot, M.S. OTR/L  06/04/24, 2:25 PM  ascom [phone]

## 2024-06-04 NOTE — Evaluation (Addendum)
 Physical Therapy Evaluation Patient Details Name: Stacey [name] MRN: [id number] DOB: 08-26-1960 Today's Date: 06/04/2024  History of Present Illness  Stacey [name] is a 63yoF who comes to [facility] on 06/03/24 for weakness, SOB, chilles, dysuria. Pt is s/p chemotherapy 3 days prior for lymphoma. Pt admitted for UTI sepsis.  Clinical Impression  Pt remains in high levels of pain at rest and when moving, 10/10 in LLE and ABD. Aside from this pt has successfully managed mobility to/from BR several times, endorses essentially baseline mobility and balance. Pt typically is household AMB, discussed need for WC or transport chair for safe achievement of meeting out of home mobility needs. Educated pt on goal of taking meals at EOB and AMB in room 3-6x daily (with staff or family) while admitted in order to prevent functional decline. Will continue to follow.       If plan is discharge home, recommend the following: A little help with walking and/or transfers;A little help with bathing/dressing/bathroom;Assistance with cooking/housework;Assist for transportation;Help with stairs or ramp for entrance   Can travel by private vehicle        Equipment Recommendations Wheelchair (measurements PT) (or transport chair)  Recommendations for Other Services       Functional Status Assessment Patient has had a recent decline in their functional status and demonstrates the ability to make significant improvements in function in a reasonable and predictable amount of time.     Precautions / Restrictions Precautions Precautions: Fall;Other (comment) (protective precautions) Recall of Precautions/Restrictions: Intact Restrictions Weight Bearing Restrictions Per Provider Order: No      Mobility  Bed Mobility Overal bed mobility: Needs Assistance Bed Mobility: Supine to Sit, Sit to Supine     Supine to sit: HOB elevated, Contact guard, Min assist Sit to supine: HOB elevated, Contact guard assist, Min  assist        Transfers Overall transfer level: Needs assistance Equipment used: Rolling walker (2 wheels) Transfers: Sit to/from Stand Sit to Stand: Contact guard assist           General transfer comment: brief dizziness upon standing, akin to baseline, then balanced and steady thereafter    Ambulation/Gait Ambulation/Gait assistance: Contact guard assist Gait Distance (Feet): 10 Feet (twice) Assistive device: Rolling walker (2 wheels)         General Gait Details: really limited from additional mobility by high pain in ABD and LLE  Stairs            Wheelchair Mobility     Tilt Bed    Modified Rankin (Stroke Patients Only)       Balance                                             Pertinent Vitals/Pain Pain Assessment Pain Assessment: 0-10 Pain Score: 10-Worst pain ever Pain Location: LLE and lower ABD pain Pain Intervention(s): Limited activity within patient's tolerance, Monitored during session, Premedicated before session    Home Living Family/patient expects to be discharged to:: Private residence Living Arrangements: Spouse/significant other Available Help at Discharge: Available 24 hours/day Type of Home: Mobile home Home Access: Stairs to enter Entrance Stairs-Rails: Can reach both Entrance Stairs-Number of Steps: 4   Home Layout: One level Home Equipment: [employer] (2 wheels);Cane - single point      Prior Function Prior Level of Function : Needs assist  Mobility Comments: Pt reports short distance ambulate with RW and was using SPC up until 1 month ago ADLs Comments: husband helps with all home management assists with self care tasks as needed     Extremity/Trunk Assessment                Communication        Cognition Arousal: Alert Behavior During Therapy: WFL for tasks assessed/performed   PT - Cognitive impairments: No apparent impairments                                  Cueing       General Comments      Exercises     Assessment/Plan    PT Assessment Patient needs continued PT services  PT Problem List Decreased strength;Decreased range of motion;Decreased activity tolerance;Decreased balance;Decreased mobility;Decreased safety awareness;Decreased knowledge of use of DME;Cardiopulmonary status limiting activity;Pain       PT Treatment Interventions DME instruction;Gait training;Stair training;Functional mobility training;Therapeutic activities;Therapeutic exercise;Balance training;Neuromuscular re-education;Patient/family education    PT Goals (Current goals can be found in the Care Plan section)  Acute Rehab PT Goals Patient Stated Goal: return to home, resume CA therapies, start OPPT PT Goal Formulation: With patient Time For Goal Achievement: 06/18/24 Potential to Achieve Goals: Fair    Frequency Min 1X/week     Co-evaluation               AM-PAC PT 6 Clicks Mobility  Outcome Measure Help needed turning from your back to your side while in a flat bed without using bedrails?: A Little Help needed moving from lying on your back to sitting on the side of a flat bed without using bedrails?: A Little Help needed moving to and from a bed to a chair (including a wheelchair)?: A Little Help needed standing up from a chair using your arms (e.g., wheelchair or bedside chair)?: A Little Help needed to walk in hospital room?: A Little Help needed climbing 3-5 steps with a railing? : A Little 6 Click Score: 18    End of Session   Activity Tolerance: Patient limited by fatigue;Patient limited by pain Patient left: in bed;with call bell/phone within reach   PT Visit Diagnosis: Muscle weakness (generalized) (M62.81);Difficulty in walking, not elsewhere classified (R26.2);Pain Pain - Right/Left: Left Pain - part of body: Hip    Time: 1014-1027 PT Time Calculation (min) (ACUTE ONLY): 13 min   Charges:   PT  Evaluation $PT Eval Moderate Complexity: 1 Mod   PT General Charges $$ ACUTE PT VISIT: 1 Visit    11:39 AM, 06/04/24 Peggye JAYSON Linear, PT, DPT Physical Therapist - [facility]  [phone] (ASCOM)    Verland Sprinkle C 06/04/2024, 11:37 AM

## 2024-06-04 NOTE — Assessment & Plan Note (Signed)
 On Eliquis 

## 2024-06-04 NOTE — Assessment & Plan Note (Addendum)
 Present on admission with positive urine analysis, leukocytosis and tachypnea and elevated lactic acid.  Currently on Maxipime  and vancomycin .  Admitting disposition spoke with covering urologist over the weekend recommended culture specific antibiotic and then prophylaxis after that.  Patient has left ureteral stents.

## 2024-06-04 NOTE — Assessment & Plan Note (Signed)
 Patient has neuropathy on Lyrica  and Cymbalta .  MRI lumbar spine does not show any metastatic disease or impingement.

## 2024-06-04 NOTE — Progress Notes (Signed)
 Patient informed me that abdominal pain is chronic and not acute for this hospitalization.

## 2024-06-04 NOTE — Assessment & Plan Note (Signed)
 Patient received medications to lift up the white count by oncology and also on high-dose steroids.  Also with infection.

## 2024-06-04 NOTE — Progress Notes (Signed)
 OK to discontinue tele per MD.

## 2024-06-04 NOTE — Hospital Course (Signed)
 63 y.o. female with medical history significant of diffuse large B cell lymphoma with an obstructing mass along L pelvic side wall causing hydroureter/hydronephrosis requiring stent placement by urology (04/08/2024), pulmonary embolism, recent hospitalization for sepsis related to a UTI and multiple other medical issues here with lightheadedness and dysuria.    She underwent her second cycle of chemotherapy 11/19.  She got GCSF yesterday and IVF with concern for dehydration.  Last night, she woke up around 2-3 am and felt dizzy (like she was going to faint), weak, and lightheaded.  Also noticed dysuria and nausea.  Due to her symptoms, she told her husband she needed to come to the hospital.  She felt like she was going to pass out.  Since being here, pain meds have helped with suprapubic discomfort/dysuria.  Her lightheadedness is better after IVF, but still present.  She denies fevers, notes chills.  No cough/cold.  Notes what she describes as breathing heavy, but no SOB.  No CP.  Denies smoking, drinking.   11/23.  Patient having a lot of burning on urination and feels weak.  Admitted with sepsis secondary to urinary tract infection.  Has chronic weakness left lower extremity. 11/24.  Urine culture showing gram-negative rods identification to follow. 11/25.  Urine culture growing Klebsiella pneumoniae.  Patient switched over to Rocephin  and given a dose before discharge.  Will start Keflex  tomorrow for another 10 days then prophylactic Keflex  afterwards.

## 2024-06-04 NOTE — Progress Notes (Signed)
 Progress Note   Patient: Stacey [name] FMW:[id number] DOB: Oct 03, 1960 DOA: 06/03/2024     1 DOS: the patient was seen and examined on 06/04/2024   Brief hospital course: 63 y.o. female with medical history significant of diffuse large B cell lymphoma with an obstructing mass along L pelvic side wall causing hydroureter/hydronephrosis requiring stent placement by urology (04/08/2024), pulmonary embolism, recent hospitalization for sepsis related to a UTI and multiple other medical issues here with lightheadedness and dysuria.    She underwent her second cycle of chemotherapy 11/19.  She got GCSF yesterday and IVF with concern for dehydration.  Last night, she woke up around 2-3 am and felt dizzy (like she was going to faint), weak, and lightheaded.  Also noticed dysuria and nausea.  Due to her symptoms, she told her husband she needed to come to the hospital.  She felt like she was going to pass out.  Since being here, pain meds have helped with suprapubic discomfort/dysuria.  Her lightheadedness is better after IVF, but still present.  She denies fevers, notes chills.  No cough/cold.  Notes what she describes as breathing heavy, but no SOB.  No CP.  Denies smoking, drinking.   11/23.  Patient having a lot of burning on urination and feels weak.  Admitted with sepsis secondary to urinary tract infection.  Has chronic weakness left lower extremity.  Assessment and Plan: * Severe sepsis (HCC) Present on admission with positive urine analysis, leukocytosis and tachypnea and elevated lactic acid.  Currently on Maxipime  and vancomycin .  Admitting disposition spoke with covering urologist over the weekend recommended culture specific antibiotic and then prophylaxis after that.  Patient has left ureteral stents.  Leukocytosis Patient received medications to lift up the white count by oncology and also on high-dose steroids.  Also with infection.  Diffuse large B-cell lymphoma (HCC) Seen by Dr. Babara  here.  Follow-up with oncology as outpatient.  Weakness of left lower extremity Patient has neuropathy on Lyrica  and Cymbalta .  MRI does not show any metastatic disease or impingement.  History of pulmonary embolism On Eliquis         Subjective: Patient admitted with sepsis secondary to urinary tract infection.  Feels weak.  Has some burning on urination.  History of lymphoma.  Physical Exam: Vitals:   06/04/24 0400 06/04/24 0740 06/04/24 1126 06/04/24 1423  BP:  (!) 131/59 137/67 124/61  Pulse:  68 70 82  Resp: 15 17 16 18   Temp:  98.4 F (36.9 C) 98.1 F (36.7 C) 98.2 F (36.8 C)  TempSrc:      SpO2:  97% 97% 94%  Weight:      Height:       Physical Exam HENT:     Head: Normocephalic.  Eyes:     General: Lids are normal.     Conjunctiva/sclera: Conjunctivae normal.  Cardiovascular:     Rate and Rhythm: Normal rate and regular rhythm.     Heart sounds: Normal heart sounds, S1 normal and S2 normal.  Pulmonary:     Breath sounds: No decreased breath sounds, wheezing, rhonchi or rales.  Abdominal:     Palpations: Abdomen is soft.     Tenderness: There is no abdominal tenderness.  Musculoskeletal:     Right lower leg: Swelling present.     Left lower leg: Swelling present.  Skin:    General: Skin is warm.     Findings: No rash.  Neurological:     Mental Status: She is alert  and oriented to person, place, and time.     Comments: Barely able to straight leg raise with left leg.     Data Reviewed: Urinalysis positive, blood cultures negative for less than 1 day Lipase 35, hemoglobin 8.7, platelet count 216, initial lactic acid 2.4 repeat lactic acid 1.4 creatinine 0.39 electrolytes now normal range. Family Communication: Updated husband on the phone  Disposition: Status is: Inpatient Remains inpatient appropriate because: Since patient admitted with sepsis will continue IV antibiotics until urine culture is back so I can discharge her on the correct  antibiotic.  Planned Discharge Destination: Home with Home Health    Time spent: 28 minutes  Author: Charlie Patterson, MD 06/04/2024 2:38 PM  For on call review [email or web address].

## 2024-06-04 NOTE — Assessment & Plan Note (Signed)
 Seen by Dr. Babara here.  Follow-up with oncology as outpatient.

## 2024-06-04 NOTE — Consult Note (Signed)
 Hematology/Oncology Consult note [phone] [phone]      Patient Care Team: [name], DEVONNA as PCP - General (Physician Assistant) Babara Call, MD as Consulting Physician (Oncology)   Name of the patient: Stacey [name]  [id number]  [birth year]   REASON FOR COSULTATION:  UTI, diffusely B cell lymphoma. History of presenting illness42  63 y.o. female with past medical history of diffuse large B-cell lymphoma, on chemotherapy, GERD, recent complicated UTI secondary to Enterococcus faecalis, left ureteric stent for left hydronephrosis presented to emergency room for evaluation of dysuria, poor oral intake  Patient reports very poor appetite with very little oral intake.  She has been getting hydration sessions at cancer center and felt better on 06/02/2024. However 06/03/2024, she woke up with chills, dysuria, weakness.  She is immunocompromised. She received cycle 2 chemotherapy with dose reduced R CHP with G-CSF support..  Vincristine  was discontinued due to concern of worsening of her pre-existing neuropathy.  UA was positive for nitrate and leukocytes. Urine culture is pending.  White count 41, predominant neutrophilia 06/03/2024, CT abdomen pelvis with contrast showed 1. Left-sided pigtail ureteral catheter with similar or minimally decreased mild left hydronephrosis. 2. Enhancement of the left urothelium suggestive of ascending UTI. Correlation with urinalysis recommended. 3. No bowel obstruction.  Patient is admitted for treatment of complicated UTI.  Allergies  Allergen Reactions   Propofol  Nausea Only    Patient states medication can be tolerated with an antiemetic before taking    Patient Active Problem List   Diagnosis Date Noted   Diffuse large B-cell lymphoma (HCC) 04/26/2024    Priority: High   Neuropathy 05/31/2024    Priority: Medium    Pulmonary embolism (HCC) 05/18/2024    Priority: Medium    Neoplasm related pain 05/10/2024     Priority: Medium    Encounter for antineoplastic chemotherapy 05/10/2024    Priority: Medium    Sepsis secondary to UTI (HCC) 06/03/2024   Thrombocytopenia 05/22/2024   Sepsis (HCC) 05/18/2024   Urinary tract infection without hematuria 05/18/2024   Neutropenia 05/18/2024   Neutropenic fever 05/17/2024   Pre-diabetes    Hydronephrosis of left kidney 04/08/2024   AKI (acute kidney injury) 04/08/2024   Hx of total shoulder replacement, right 02/08/2020   Osteoarthritis of left shoulder 01/11/2018   S/P shoulder replacement, left 01/11/2018     Past Medical History:  Diagnosis Date   Arthritis    left shoulder, neck, lower back  (01/11/2018)   Cancer (HCC)    Chronic lower back pain    Family history of adverse reaction to anesthesia    Mother has nausea   GERD (gastroesophageal reflux disease)    PONV (postoperative nausea and vomiting)    Pre-diabetes    UTI (urinary tract infection) 03/20/2021     Past Surgical History:  Procedure Laterality Date   APPENDECTOMY     CYSTOSCOPY W/ URETERAL STENT PLACEMENT Left 04/08/2024   Procedure: CYSTOSCOPY, WITH RETROGRADE PYELOGRAM AND URETERAL STENT INSERTION;  Surgeon: Watt Rush, MD;  Location: [facility] ORS;  Service: Urology;  Laterality: Left;   IR IMAGING GUIDED PORT INSERTION  05/01/2024   JOINT REPLACEMENT     TOTAL SHOULDER ARTHROPLASTY Left 01/11/2018   TOTAL SHOULDER ARTHROPLASTY Left 01/11/2018   Procedure: LEFT TOTAL SHOULDER ARTHROPLASTY;  Surgeon: Sharl Selinda Dover, MD;  Location: [facility] OR;  Service: Orthopedics;  Laterality: Left;  2.5 hrs   TOTAL SHOULDER ARTHROPLASTY Right 02/08/2020   Procedure: TOTAL SHOULDER ARTHROPLASTY;  Surgeon: Sharl Selinda  Belvie, MD;  Location: [facility] OR;  Service: Orthopedics;  Laterality: Right;  2.5 hrs RNFA    Social History   Socioeconomic History   Marital status: Married    Spouse name: Terrisha Lopata   Number of children: Not on file   Years of education: Not on file   Highest  education level: Not on file  Occupational History   Not on file  Tobacco Use   Smoking status: Never   Smokeless tobacco: Never  Vaping Use   Vaping status: Never Used  Substance and Sexual Activity   Alcohol use: Not Currently    Comment: 01/11/2018 might have 1 drink/month   Drug use: Never   Sexual activity: Yes  Other Topics Concern   Not on file  Social History Narrative   Not on file   Social Drivers of Health   Financial Resource Strain: Low Risk  (03/30/2024)   Received from [facility]   Overall Financial Resource Strain (CARDIA)    Difficulty of Paying Living Expenses: Not hard at all  Food Insecurity: No Food Insecurity (06/03/2024)   Hunger Vital Sign    Worried About Running Out of Food in the Last Year: Never true    Ran Out of Food in the Last Year: Never true  Transportation Needs: No Transportation Needs (06/03/2024)   PRAPARE - Administrator, Civil Service (Medical): No    Lack of Transportation (Non-Medical): No  Physical Activity: Not on file  Stress: Not on file  Social Connections: Socially Integrated (05/17/2024)   Social Connection and Isolation Panel    Frequency of Communication with Friends and Family: Three times a week    Frequency of Social Gatherings with Friends and Family: Three times a week    Attends Religious Services: 1 to 4 times per year    Active Member of Clubs or Organizations: Yes    Attends Banker Meetings: 1 to 4 times per year    Marital Status: Married  Catering Manager Violence: Not At Risk (06/03/2024)   Humiliation, Afraid, Rape, and Kick questionnaire    Fear of Current or Ex-Partner: No    Emotionally Abused: No    Physically Abused: No    Sexually Abused: No     Family History  Problem Relation Age of Onset   Hypertension Mother    Heart disease Father    Heart attack Father    CAD Father    Colon cancer Neg Hx    Esophageal cancer Neg Hx    Rectal cancer Neg Hx     Stomach cancer Neg Hx      Current Facility-Administered Medications:    acetaminophen  (TYLENOL ) tablet 650 mg, 650 mg, Oral, Q6H PRN **OR** acetaminophen  (TYLENOL ) suppository 650 mg, 650 mg, Rectal, Q6H PRN, Perri DELENA Meliton Mickey., MD   acyclovir  (ZOVIRAX ) 200 MG capsule 400 mg, 400 mg, Oral, Daily, Perri DELENA Meliton Mickey., MD, 400 mg at 06/04/24 9150   allopurinol  (ZYLOPRIM ) tablet 300 mg, 300 mg, Oral, Daily, Perri DELENA Meliton Mickey., MD, 300 mg at 06/04/24 0848   apixaban  (ELIQUIS ) tablet 5 mg, 5 mg, Oral, BID, Perri DELENA Meliton Mickey., MD, 5 mg at 06/04/24 0849   ceFEPIme  (MAXIPIME ) 2 g in sodium chloride  0.9 % 100 mL IVPB, 2 g, Intravenous, Q8H, Hunt, Madison H, RPH, Last Rate: 200 mL/hr at 06/04/24 0416, 2 g at 06/04/24 0416   DULoxetine  (CYMBALTA ) DR capsule 30 mg, 30 mg, Oral, Daily, Perri, A  Meliton Raddle., MD, 30 mg at 06/04/24 0848   feeding supplement (ENSURE PLUS HIGH PROTEIN) liquid 237 mL, 237 mL, Oral, BID BM, Perri DELENA Meliton Raddle., MD, 237 mL at 06/04/24 0850   fentaNYL  (DURAGESIC ) 25 MCG/HR 1 patch, 1 patch, Transdermal, Q72H, Perri DELENA Meliton Raddle., MD, 1 patch at 06/03/24 1657   lactated ringers  infusion, , Intravenous, Continuous, Perri DELENA Meliton Raddle., MD, Last Rate: 100 mL/hr at 06/04/24 0400, Infusion Verify at 06/04/24 0400   methocarbamol  (ROBAXIN ) tablet 500 mg, 500 mg, Oral, Q8H PRN, Perri DELENA Meliton Raddle., MD   morphine  (PF) 2 MG/ML injection 2 mg, 2 mg, Intravenous, Q2H PRN, Perri DELENA Meliton Raddle., MD, 2 mg at 06/04/24 9043   Oral care mouth rinse, 15 mL, Mouth Rinse, PRN, Perri DELENA Meliton Raddle., MD   oxybutynin  (DITROPAN -XL) 24 hr tablet 10 mg, 10 mg, Oral, Daily, Perri DELENA Meliton Raddle., MD, 10 mg at 06/04/24 9150   oxyCODONE  (Oxy IR/ROXICODONE ) immediate release tablet 10 mg, 10 mg, Oral, Q4H PRN, Perri DELENA Meliton Raddle., MD, 10 mg at 06/03/24 2209   pantoprazole  (PROTONIX ) EC tablet 40 mg, 40 mg, Oral, Daily, Perri DELENA Meliton Raddle., MD, 40 mg at 06/04/24 0849    polyethylene glycol (MIRALAX  / GLYCOLAX ) packet 17 g, 17 g, Oral, BID, Perri DELENA Meliton Raddle., MD, 17 g at 06/04/24 9150   potassium chloride  SA (KLOR-CON  M) CR tablet 20 mEq, 20 mEq, Oral, Daily, Perri DELENA Meliton Raddle., MD, 20 mEq at 06/04/24 0848   pregabalin  (LYRICA ) capsule 75 mg, 75 mg, Oral, BID, Perri DELENA Meliton Raddle., MD, 75 mg at 06/04/24 9150   tamsulosin  (FLOMAX ) capsule 0.4 mg, 0.4 mg, Oral, Daily, Perri DELENA Meliton Raddle., MD, 0.4 mg at 06/04/24 9150   traZODone  (DESYREL ) tablet 50 mg, 50 mg, Oral, QHS PRN, Perri DELENA Meliton Raddle., MD   LORINA vancomycin  [facility]) IVPB 1750 mg/350 mL, 1,750 mg, Intravenous, Once, Stopped at 06/03/24 1658 **FOLLOWED BY** vancomycin  (VANCOREADY) IVPB 1500 mg/300 mL, 1,500 mg, Intravenous, Q24H, Hunt, Madison H, RPH  Review of Systems - Oncology  PHYSICAL EXAM Vitals:   06/04/24 0003 06/04/24 0335 06/04/24 0400 06/04/24 0740  BP: 109/64 114/68  (!) 131/59  Pulse: 72 65  68  Resp: 17 18 15 17   Temp: 98.2 F (36.8 C) 98.1 F (36.7 C)  98.4 F (36.9 C)  TempSrc: Oral Oral    SpO2: 96% 97%  97%  Weight:      Height:       Physical Exam    LABORATORY STUDIES    Latest Ref Rng & Units 06/04/2024    4:55 AM 06/03/2024   10:58 AM 06/02/2024   10:59 AM  CBC  WBC 4.0 - 10.5 K/uL 35.0  41.0  6.5   Hemoglobin 12.0 - 15.0 g/dL 8.7  89.4  89.5   Hematocrit 36.0 - 46.0 % 26.3  31.6  30.5   Platelets 150 - 400 K/uL 216  336  392       Latest Ref Rng & Units 06/04/2024    4:55 AM 06/03/2024   10:58 AM 06/02/2024   10:59 AM  CMP  Glucose 70 - 99 mg/dL 91  879  860   BUN 8 - 23 mg/dL 12  17  24    Creatinine 0.44 - 1.00 mg/dL 9.60  9.48  9.40   Sodium 135 - 145 mmol/L 137  138  134   Potassium 3.5 - 5.1 mmol/L 3.8  3.4  3.6  Chloride 98 - 111 mmol/L 106  104  101   CO2 22 - 32 mmol/L 26  23  23    Calcium 8.9 - 10.3 mg/dL 8.3  8.9  8.7   Total Protein 6.5 - 8.1 g/dL 5.1  6.4  6.4   Total Bilirubin 0.0 - 1.2 mg/dL 0.5  0.7  0.5    Alkaline Phos 38 - 126 U/L 65  63  41   AST 15 - 41 U/L 13  16  15    ALT 0 - 44 U/L 7  10  11       RADIOGRAPHIC STUDIES: I have personally reviewed the radiological images as listed and agreed with the findings in the report. CT ABDOMEN PELVIS W CONTRAST Result Date: 06/03/2024 CLINICAL DATA:  Generalized abdominal pain. EXAM: CT ABDOMEN AND PELVIS WITH CONTRAST TECHNIQUE: Multidetector CT imaging of the abdomen and pelvis was performed using the standard protocol following bolus administration of intravenous contrast. RADIATION DOSE REDUCTION: This exam was performed according to the departmental dose-optimization program which includes automated exposure control, adjustment of the mA and/or kV according to patient size and/or use of iterative reconstruction technique. CONTRAST:  OMNIPAQUE  IOHEXOL  300 MG/ML  SOLN COMPARISON:  CT abdomen pelvis dated 05/17/2024 FINDINGS: Lower chest: The visualized lung bases are clear. No intra-abdominal free air or free fluid. Hepatobiliary: No focal liver abnormality is seen. No gallstones, gallbladder wall thickening, or biliary dilatation. Pancreas: Unremarkable. No pancreatic ductal dilatation or surrounding inflammatory changes. Spleen: Normal in size without focal abnormality. Adrenals/Urinary Tract: The adrenal glands unremarkable. Left-sided pigtail ureteral catheter with tip in the left renal inferior pole collecting system and distal end in the urinary bladder. Mild left hydronephrosis similar or minimally decreased. There is enhancement of the left urothelium suggestive of ascending UTI. The right kidney is unremarkable. The right ureter and urinary bladder appear unremarkable. Stomach/Bowel: Moderate stool throughout the colon. There is no bowel obstruction or active inflammation. Appendectomy. Vascular/Lymphatic: The abdominal aorta and IVC unremarkable. No portal venous gas. There is no adenopathy. Reproductive: The uterus is grossly unremarkable no  suspicious adnexal masses. Other: None Musculoskeletal: Degenerative changes of the spine. No acute osseous pathology. IMPRESSION: 1. Left-sided pigtail ureteral catheter with similar or minimally decreased mild left hydronephrosis. 2. Enhancement of the left urothelium suggestive of ascending UTI. Correlation with urinalysis recommended. 3. No bowel obstruction. Electronically Signed   By: Vanetta Chou M.D.   On: 06/03/2024 13:30   DG Chest Port 1 View Result Date: 06/03/2024 CLINICAL DATA:  Questionable sepsis - evaluate for abnormality EXAM: PORTABLE CHEST 1 VIEW COMPARISON:  May 17, 2024 FINDINGS: The cardiomediastinal silhouette is unchanged in contour.RIGHT chest port with tip terminating over the SVC. No pleural effusion. No pneumothorax. No acute pleuroparenchymal abnormality. IMPRESSION: No acute cardiopulmonary abnormality. Electronically Signed   By: Corean Salter M.D.   On: 06/03/2024 11:56   MR Lumbar Spine W Wo Contrast Result Date: 05/31/2024 EXAM: MRI LUMBAR SPINE 05/31/2024 03:19:00 PM TECHNIQUE: Multiplanar multisequence MRI of the lumbar spine was performed with and without the administration of intravenous contrast. 7 mL (gadobutrol  (GADAVIST ) 1 MMOL/ML injection 7 mL GADOBUTROL  1 MMOL/ML IV SOLN). COMPARISON: CT abdomen and pelvis 05/17/2024. CLINICAL HISTORY: Metastatic disease evaluation; low back pain with numbness down left leg. History of lymphoma. FINDINGS: BONES AND ALIGNMENT: 5 lumbar type vertebrae. Mild lumbar dextroscoliosis. Grade 1 retrolisthesis of L1-L2 and L2-L3 and grade 1 anterolisthesis of L4-L5. No fracture or suspicious marrow lesion. Mild bilateral facet edema at L4-L5.  SPINAL CORD: The conus medullaris terminates at L1-L2 and is normal in signal. No evidence of epidural tumor. SOFT TISSUES: No paraspinal mass. INCIDENTAL FINDINGS: Mild left hydronephrosis, similar to the prior CT. DISC LEVELS: Disc desiccation primarily from L1-L2 through L4-L5. Severe  disc space narrowing at L1-L2 and mild to moderate narrowing at L2-L3 and L3-L4. T12-L1: A broad central disc protrusion results in mild spinal stenosis. Patent neural foramina. L1-L2: Disc bulging and mild facet hypertrophy without significant stenosis. L2-L3: Circumferential disc bulging mildly eccentric to the left and moderate to severe facet hypertrophy result in mild left lateral recess stenosis and mild bilateral neural foraminal stenosis without spinal stenosis. L3-L4: Disc bulging, prominent epidural fat, and moderate to severe facet hypertrophy result in mild to moderate spinal stenosis, mild right greater than left lateral recess stenosis, and mild bilateral neural foraminal stenosis. L4-L5: Anterolisthesis with mild bulging of uncovered disc and severe facet hypertrophy result in mild bilateral lateral recess stenosis and mild bilateral neural foraminal stenosis without significant spinal stenosis. L5-S1: Severe facet hypertrophy without disc herniation or stenosis. IMPRESSION: 1. No evidence of metastatic disease in the lumbar spine. 2. Diffuse disc and facet degeneration with mild to moderate spinal stenosis at L3-L4. 3. Mild lateral recess and neural foraminal stenosis at L2-3 and L4-5. 4. Mild left hydronephrosis, similar to prior CT. Electronically signed by: Dasie Hamburg MD 05/31/2024 03:31 PM EST RP Workstation: [id number]   US  Venous Img Lower Bilateral (DVT) Result Date: 05/19/2024 CLINICAL DATA:  Lower extremity pain. EXAM: BILATERAL LOWER EXTREMITY VENOUS DOPPLER ULTRASOUND TECHNIQUE: Gray-scale sonography with graded compression, as well as color Doppler and duplex ultrasound were performed to evaluate the lower extremity deep venous systems from the level of the common femoral vein and including the common femoral, femoral, profunda femoral, popliteal and calf veins including the posterior tibial, peroneal and gastrocnemius veins when visible. The superficial great saphenous vein was also  interrogated. Spectral Doppler was utilized to evaluate flow at rest and with distal augmentation maneuvers in the common femoral, femoral and popliteal veins. COMPARISON:  None Available. FINDINGS: RIGHT LOWER EXTREMITY Common Femoral Vein: No evidence of thrombus. Normal compressibility, respiratory phasicity and response to augmentation. Saphenofemoral Junction: No evidence of thrombus. Normal compressibility and flow on color Doppler imaging. Profunda Femoral Vein: No evidence of thrombus. Normal compressibility and flow on color Doppler imaging. Femoral Vein: No evidence of thrombus. Normal compressibility, respiratory phasicity and response to augmentation. Popliteal Vein: No evidence of thrombus. Normal compressibility, respiratory phasicity and response to augmentation. Calf Veins: No evidence of thrombus. Normal compressibility and flow on color Doppler imaging. Superficial Great Saphenous Vein: No evidence of thrombus. Normal compressibility. Venous Reflux:  None. Other Findings: No evidence of superficial thrombophlebitis or abnormal fluid collection. LEFT LOWER EXTREMITY Common Femoral Vein: No evidence of thrombus. Normal compressibility, respiratory phasicity and response to augmentation. Saphenofemoral Junction: No evidence of thrombus. Normal compressibility and flow on color Doppler imaging. Profunda Femoral Vein: No evidence of thrombus. Normal compressibility and flow on color Doppler imaging. Femoral Vein: No evidence of thrombus. Normal compressibility, respiratory phasicity and response to augmentation. Popliteal Vein: No evidence of thrombus. Normal compressibility, respiratory phasicity and response to augmentation. Calf Veins: No evidence of thrombus. Normal compressibility and flow on color Doppler imaging. Superficial Great Saphenous Vein: No evidence of thrombus. Normal compressibility. Venous Reflux:  None. Other Findings: No evidence of superficial thrombophlebitis or abnormal fluid  collection. IMPRESSION: No evidence of deep venous thrombosis in either lower extremity. Electronically Signed  By: Marcey Moan M.D.   On: 05/19/2024 17:20   CT Angio Chest Pulmonary Embolism (PE) W or WO Contrast Result Date: 05/18/2024 CLINICAL DATA:  Concern for pulmonary embolism.  Lymphoma. EXAM: CT ANGIOGRAPHY CHEST WITH CONTRAST TECHNIQUE: Multidetector CT imaging of the chest was performed using the standard protocol during bolus administration of intravenous contrast. Multiplanar CT image reconstructions and MIPs were obtained to evaluate the vascular anatomy. RADIATION DOSE REDUCTION: This exam was performed according to the departmental dose-optimization program which includes automated exposure control, adjustment of the mA and/or kV according to patient size and/or use of iterative reconstruction technique. CONTRAST:  50mL OMNIPAQUE  IOHEXOL  350 MG/ML SOLN COMPARISON:  Chest radiograph dated 05/17/2024. FINDINGS: Cardiovascular: There is no cardiomegaly or pericardial effusion. The thoracic aorta is unremarkable. The origins of the great vessels of the aortic arch are patent. Right-sided Port-A-Cath with tip in the right atrium at the cavoatrial junction. Linear nonocclusive right lower lobe subsegmental pulmonary artery embolus, age indeterminate. No CT evidence of right heart straining. Mediastinum/Nodes: No hilar or mediastinal adenopathy. The esophagus and the thyroid gland are grossly unremarkable no mediastinal fluid collection. Lungs/Pleura: Minimal bibasilar linear atelectasis. No focal consolidation, pleural effusion, or pneumothorax. The central airways are patent. Upper Abdomen: No acute abnormality. Musculoskeletal: Degenerative changes of spine. No acute osseous pathology. Bilateral shoulder arthroplasties. Review of the MIP images confirms the above findings. IMPRESSION: 1. Age indeterminate linear nonocclusive right lower lobe subsegmental pulmonary artery embolus. No CT evidence  of right heart straining. 2. No focal consolidation. These results will be called to the ordering clinician or representative by the Radiologist Assistant, and communication documented in the PACS or [employer]. Electronically Signed   By: Vanetta Chou M.D.   On: 05/18/2024 14:56   CT ABDOMEN PELVIS W CONTRAST Result Date: 05/17/2024 CLINICAL DATA:  Abdominal pain.  Concern for kidney stone. EXAM: CT ABDOMEN AND PELVIS WITH CONTRAST TECHNIQUE: Multidetector CT imaging of the abdomen and pelvis was performed using the standard protocol following bolus administration of intravenous contrast. RADIATION DOSE REDUCTION: This exam was performed according to the departmental dose-optimization program which includes automated exposure control, adjustment of the mA and/or kV according to patient size and/or use of iterative reconstruction technique. CONTRAST:  OMNIPAQUE  IOHEXOL  300 MG/ML  SOLN COMPARISON:  CT abdomen pelvis dated 04/08/2024. FINDINGS: Lower chest: The visualized lung bases are clear. Tiny nonocclusive embolus in the right lower lobe subsegmental branch (2/2). Chest CT may provide better evaluation. No intra-abdominal free air or free fluid. Hepatobiliary: The liver is unremarkable. No biliary dilatation. The gallbladder is unremarkable. Pancreas: Unremarkable. No pancreatic ductal dilatation or surrounding inflammatory changes. Spleen: Normal in size without focal abnormality. Adrenals/Urinary Tract: The adrenal glands unremarkable. Left ureteral stent with proximal tip in the inferior pole collecting system and distal end in the urinary bladder. There is mild left hydronephrosis. There is urothelial enhancement of the left renal collecting system and ureter suggestive of ascending UTI. No stone identified. The right kidney, right ureter, and urinary bladder appear unremarkable. Stomach/Bowel: Moderate stool throughout the colon. There is no bowel obstruction or active inflammation.  Appendectomy. Vascular/Lymphatic: The abdominal aorta and IVC unremarkable. No portal venous gas. There is no adenopathy. Reproductive: The uterus is grossly unremarkable. Other: Interval decrease in the masslike soft tissues thickening of the left pelvic sidewall measuring approximately 5.8 x 3.3 cm in greatest axial dimensions (previously 6.7 x 4.8 cm). A 1.4 x 1.2 cm rim enhancing low attenuating focus within this soft tissue  mass (coronal 52/5) suspicious for an area of phlegmonous change or developing abscess. Musculoskeletal: Degenerative changes of the spine. No acute osseous pathology. IMPRESSION: 1. Left ureteral stent with mild left hydronephrosis and findings suggestive of ascending UTI. Correlation with urinalysis recommended. 2. Interval decrease in the masslike soft tissues thickening of the left pelvic sidewall. A 1.4 x 1.2 cm rim enhancing low attenuating focus within this soft tissue mass suspicious for an area of phlegmonous change or developing abscess. 3. Tiny nonocclusive embolus in the right lower lobe subsegmental branch. Chest CT may provide better evaluation. These results were called by telephone at the time of interpretation on 05/17/2024 at 6:28 pm to provider LAMAR PRICE , who verbally acknowledged these results. Electronically Signed   By: Vanetta Chou M.D.   On: 05/17/2024 18:39   DG Chest Port 1 View Result Date: 05/17/2024 CLINICAL DATA:  Sepsis. EXAM: PORTABLE CHEST 1 VIEW COMPARISON:  Chest radiograph dated 09/17/2012 FINDINGS: Right-sided PICC with tip over central SVC. No focal consolidation, pleural effusion, pneumothorax. The cardiac silhouette is within limits. No acute osseous pathology. Bilateral shoulder arthroplasties. IMPRESSION: No active disease. Electronically Signed   By: Vanetta Chou M.D.   On: 05/17/2024 16:38   IR IMAGING GUIDED PORT INSERTION Result Date: 05/01/2024 CLINICAL DATA:  Large B-cell lymphoma of the pelvis and need for porta cath to  begin chemotherapy. EXAM: IMPLANTED PORT A CATH PLACEMENT WITH ULTRASOUND AND FLUOROSCOPIC GUIDANCE ANESTHESIA/SEDATION: Moderate (conscious) sedation was employed during this procedure. A total of Versed  1.0 mg and Fentanyl  50 mcg was administered intravenously. Moderate Sedation Time: 28 minutes. The patient's level of consciousness and vital signs were monitored continuously by radiology nursing throughout the procedure under my direct supervision. FLUOROSCOPY: Radiation Exposure Index: 1.9 mGy Kerma PROCEDURE: The procedure, risks, benefits, and alternatives were explained to the patient. Questions regarding the procedure were encouraged and answered. The patient understands and consents to the procedure. A time-out was performed prior to initiating the procedure. Ultrasound was utilized to confirm patency of the right internal jugular vein. An ultrasound image was saved and recorded. The right neck and chest were prepped with chlorhexidine  in a sterile fashion, and a sterile drape was applied covering the operative field. Maximum barrier sterile technique with sterile gowns and gloves were used for the procedure. Local anesthesia was provided with 1% lidocaine . After creating a small venotomy incision, a 21 gauge needle was advanced into the right internal jugular vein under direct, real-time ultrasound guidance. Ultrasound image documentation was performed. After securing guidewire access, an 8 Fr dilator was placed. A J-wire was kinked to measure appropriate catheter length. A subcutaneous port pocket was then created along the upper chest wall utilizing sharp and blunt dissection. Portable cautery was utilized. The pocket was irrigated with sterile saline. A single lumen power injectable port was chosen for placement. The 8 Fr catheter was tunneled from the port pocket site to the venotomy incision. The port was placed in the pocket. External catheter was trimmed to appropriate length based on guidewire  measurement. At the venotomy, an 8 Fr peel-away sheath was placed over a guidewire. The catheter was then placed through the sheath and the sheath removed. Final catheter positioning was confirmed and documented with a fluoroscopic spot image. The port was accessed with a needle and aspirated and flushed with heparinized saline. The access needle was removed. The venotomy and port pocket incisions were closed with subcutaneous 3-0 Monocryl and subcuticular 4-0 Vicryl. Dermabond was applied to both incisions. COMPLICATIONS:  COMPLICATIONS None FINDINGS: After catheter placement, the tip lies at the cavo-atrial junction. The catheter aspirates normally and is ready for immediate use. IMPRESSION: Placement of single lumen port a cath via right internal jugular vein. The catheter tip lies at the cavo-atrial junction. A power injectable port a cath was placed and is ready for immediate use. Electronically Signed   By: Marcey Moan M.D.   On: 05/01/2024 11:53   CT BONE MARROW BIOPSY & ASPIRATION Result Date: 05/01/2024 CLINICAL DATA:  Large B-cell lymphoma of pelvic sidewall and need for bone marrow biopsy. EXAM: CT GUIDED BONE MARROW ASPIRATION AND BIOPSY ANESTHESIA/SEDATION: Moderate (conscious) sedation was employed during this procedure. A total of Versed  2.0 mg and Fentanyl  100 mcg was administered intravenously. Moderate Sedation Time: 10 minutes. The patient's level of consciousness and vital signs were monitored continuously by radiology nursing throughout the procedure under my direct supervision. PROCEDURE: The procedure risks, benefits, and alternatives were explained to the patient. Questions regarding the procedure were encouraged and answered. The patient understands and consents to the procedure. A time out was performed prior to initiating the procedure. The right gluteal region was prepped with chlorhexidine . Sterile gown and sterile gloves were used for the procedure. Local anesthesia was provided  with 1% Lidocaine . Under CT guidance, an 11 gauge On Control bone cutting needle was advanced from a posterior approach into the right iliac bone. Needle positioning was confirmed with CT. Initial non heparinized and heparinized aspirate samples were obtained of bone marrow. Core biopsy was performed via the On Control drill needle. COMPLICATIONS: None FINDINGS: Inspection of initial aspirate did reveal visible particles. Intact core biopsy sample was obtained. IMPRESSION: CT guided bone marrow biopsy of right posterior iliac bone with both aspirate and core samples obtained. Electronically Signed   By: Marcey Moan M.D.   On: 05/01/2024 10:41   ECHOCARDIOGRAM COMPLETE Result Date: 04/26/2024    ECHOCARDIOGRAM REPORT   Patient Name:   SHIA DELAINE Date of Exam: 04/26/2024 Medical Rec #:  [id number]          Height:       64.0 in Accession #:    [id number]         Weight:       170.3 lb Date of Birth:  Feb 26, 1961          BSA:          1.827 m Patient Age:    63 years           BP:           121/67 mmHg Patient Gender: F                  HR:           54 bpm. Exam Location:  [facility] Procedure: 2D Echo, 3D Echo, Cardiac Doppler, Color Doppler and Strain Analysis            (Both Spectral and Color Flow Doppler were utilized during            procedure). Indications:     Chemo Z09  History:         Patient has no prior history of Echocardiogram examinations.  Sonographer:     Thea Norlander RCS Referring Phys:  [id number] Vanessia Bokhari Diagnosing Phys: Evalene Lunger MD IMPRESSIONS  1. Left ventricular ejection fraction, by estimation, is 55 to 60%. The left ventricle has normal function. The left ventricle has no regional wall motion abnormalities. Left ventricular  diastolic parameters are consistent with Grade I diastolic dysfunction (impaired relaxation). The average left ventricular global longitudinal strain is -19.7 %. The global longitudinal strain is normal.  2. Right ventricular systolic function is normal.  The right ventricular size is normal.  3. The mitral valve is normal in structure. Mild mitral valve regurgitation. No evidence of mitral stenosis.  4. The aortic valve is normal in structure. Aortic valve regurgitation is not visualized. No aortic stenosis is present.  5. The inferior vena cava is normal in size with greater than 50% respiratory variability, suggesting right atrial pressure of 3 mmHg. FINDINGS  Left Ventricle: Left ventricular ejection fraction, by estimation, is 55 to 60%. The left ventricle has normal function. The left ventricle has no regional wall motion abnormalities. The average left ventricular global longitudinal strain is -19.7 %. Strain was performed and the global longitudinal strain is normal. The left ventricular internal cavity size was normal in size. There is no left ventricular hypertrophy. Left ventricular diastolic parameters are consistent with Grade I diastolic dysfunction (impaired relaxation). Right Ventricle: The right ventricular size is normal. No increase in right ventricular wall thickness. Right ventricular systolic function is normal. Left Atrium: Left atrial size was normal in size. Right Atrium: Right atrial size was normal in size. Pericardium: There is no evidence of pericardial effusion. Mitral Valve: The mitral valve is normal in structure. Mild mitral valve regurgitation. No evidence of mitral valve stenosis. Tricuspid Valve: The tricuspid valve is normal in structure. Tricuspid valve regurgitation is trivial. No evidence of tricuspid stenosis. Aortic Valve: The aortic valve is normal in structure. Aortic valve regurgitation is not visualized. No aortic stenosis is present. Aortic valve peak gradient measures 5.2 mmHg. Pulmonic Valve: The pulmonic valve was normal in structure. Pulmonic valve regurgitation is not visualized. No evidence of pulmonic stenosis. Aorta: The aortic root is normal in size and structure. Venous: The inferior vena cava is normal in size  with greater than 50% respiratory variability, suggesting right atrial pressure of 3 mmHg. IAS/Shunts: No atrial level shunt detected by color flow Doppler. Additional Comments: 3D was performed not requiring image post processing on an independent workstation and was indeterminate.  LEFT VENTRICLE PLAX 2D LVIDd:         4.10 cm   Diastology LVIDs:         3.10 cm   LV e' medial:  6.96 cm/s LV PW:         0.80 cm   LV e' lateral: 9.14 cm/s LV IVS:        0.70 cm LVOT diam:     2.40 cm   2D Longitudinal Strain LV SV:         79        2D Strain GLS Avg:     -19.7 % LV SV Index:   43 LVOT Area:     4.52 cm                           3D Volume EF:                          3D EF:        58 %                          LV EDV:       129 ml  LV ESV:       55 ml                          LV SV:        74 ml RIGHT VENTRICLE             IVC RV S prime:     13.20 cm/s  IVC diam: 1.20 cm TAPSE (M-mode): 2.3 cm LEFT ATRIUM             Index        RIGHT ATRIUM           Index LA diam:        3.30 cm 1.81 cm/m   RA Area:     13.40 cm LA Vol (A2C):   35.3 ml 19.32 ml/m  RA Volume:   27.80 ml  15.21 ml/m LA Vol (A4C):   20.4 ml 11.16 ml/m LA Biplane Vol: 28.9 ml 15.82 ml/m  AORTIC VALVE AV Area (Vmax): 3.59 cm AV Vmax:        114.00 cm/s AV Peak Grad:   5.2 mmHg LVOT Vmax:      90.40 cm/s LVOT Vmean:     56.100 cm/s LVOT VTI:       0.175 m  AORTA Ao Root diam: 3.10 cm Ao Asc diam:  3.30 cm TRICUSPID VALVE TR Peak grad:   7.7 mmHg TR Vmax:        139.00 cm/s  SHUNTS Systemic VTI:  0.18 m Systemic Diam: 2.40 cm Evalene Lunger MD Electronically signed by Evalene Lunger MD Signature Date/Time: 04/26/2024/2:00:19 PM    Final    NM PET Image Initial (PI) Skull Base To Thigh (F-18 FDG) Result Date: 04/25/2024 CLINICAL DATA:  Initial treatment strategy for new diagnosis of lymphoma. Known left pelvic soft tissue mass. EXAM: NUCLEAR MEDICINE PET SKULL BASE TO THIGH TECHNIQUE: 9.4 mCi F-18 FDG was injected  intravenously. Full-ring PET imaging was performed from the skull base to thigh after the radiotracer. CT data was obtained and used for attenuation correction and anatomic localization. Fasting blood glucose: 86 mg/dl COMPARISON:  Abdominopelvic CTA and CT of 04/08/2024 FINDINGS: Mediastinal blood pool activity: SUV max 3.5 Liver activity: SUV max 4.0 NECK: Extensive hypermetabolic brown fat throughout the neck and upper chest. Given this limitation, no cervical nodal hypermetabolism identified. Incidental CT findings: Left carotid atherosclerosis. No cervical adenopathy. CHEST: No pulmonary parenchymal or thoracic nodal hypermetabolism. Incidental CT findings: Aortic and coronary artery calcification. Tiny right pleural effusion is new since the prior CTs. ABDOMEN/PELVIS: Mild splenic hypermetabolism relative to the liver (SUV 4.6). No splenomegaly. The posterior left pelvic sidewall mass on prior CTA is hypermetabolic. Difficult to measure on noncontrast CT, estimated at 4.5 x 3.4 cm and a S.U.V. max of 14.1 on 127/6. No separate areas of abdominopelvic nodal hypermetabolism. Incidental CT findings: Left ureteric stent in place, without significant hydronephrosis. SKELETON: No abnormal marrow activity. Incidental CT findings: Mild degenerative changes of both hips. Bilateral shoulder arthroplasties. IMPRESSION: 1. The posterior left pelvic sidewall mass is hypermetabolic, consistent with active lymphoma. 2. Mild splenic hypermetabolism relative to the liver. No splenomegaly. Cannot exclude lymphomatous involvement. 3. New small right pleural effusion. 4. No active lymphoma within the chest or neck. Mildly decreased sensitivity exam secondary to hypermetabolic brown fat. 5. Age advanced coronary artery atherosclerosis. Recommend assessment of coronary risk factors. 6.  Aortic Atherosclerosis (ICD10-I70.0). Electronically Signed   By: Rockey Marthann HERO.D.  On: 04/25/2024 08:59   CT ABDOMINAL MASS BIOPSY Result  Date: 04/10/2024 INDICATION: 63 year old female with a newly diagnosed infiltrative mass along the left pelvic sidewall. EXAM: CT-guided core biopsy of deep soft tissue mass MEDICATIONS: None. ANESTHESIA/SEDATION: Moderate (conscious) sedation was employed during this procedure. A total of Versed  1 mg and Fentanyl  50 mcg was administered intravenously by the radiology nurse. Total intra-service moderate Sedation Time: 10 minutes. The patient's level of consciousness and vital signs were monitored continuously by radiology nursing throughout the procedure under my direct supervision. COMPLICATIONS: None immediate. PROCEDURE: Informed written consent was obtained from the patient after a thorough discussion of the procedural risks, benefits and alternatives. All questions were addressed. Maximal Sterile Barrier Technique was utilized including caps, mask, sterile gowns, sterile gloves, sterile drape, hand hygiene and skin antiseptic. A timeout was performed prior to the initiation of the procedure. A CT scan was performed identifying the ill-defined mass along the left pelvic sidewall. A suitable skin entry site was selected and marked. The skin was sterilely prepped and draped in standard fashion using chlorhexidine  skin prep. Local anesthesia was attained by infiltration with 1% lidocaine . A small dermatotomy was made. A 17 gauge introducer needle was carefully advanced through the gluteal musculature and into the margin of the mass. Needle positioning was cross reference with the prior CTA of the abdomen and pelvis to ensure trajectory of the biopsy passes with not cross the internal iliac vessels. Several 18 gauge core biopsies were then obtained using the bio Pince automated biopsy device. Biopsies were placed in saline and delivered to pathology for further analysis. Post biopsy CT imaging demonstrates no evidence of immediate complication. IMPRESSION: CT-guided core biopsy of infiltrative left pelvic sidewall  mass. Electronically Signed   By: Wilkie Lent M.D.   On: 04/10/2024 13:00   DG OR UROLOGY CYSTO IMAGE ([facility] ONLY) Result Date: 04/08/2024 There is no interpretation for this exam.  This order is for images obtained during a surgical procedure.  Please See Surgeries Tab for more information regarding the procedure.   CT Angio Abd/Pel W and/or Wo Contrast Result Date: 04/08/2024 EXAM: CTA ABDOMEN AND PELVIS WITH AND WITHOUT CONTRAST 04/08/2024 07:28:48 AM TECHNIQUE: CTA images of the abdomen and pelvis without and with intravenous contrast. Three-dimensional MIP/volume rendered formations were performed. Automated exposure control, iterative reconstruction, and/or weight based adjustment of the mA/kV was utilized to reduce the radiation dose to as low as reasonably achievable. 100 mL iohexol  (OMNIPAQUE ) 350 MG/ML injection was administered. COMPARISON: CT from earlier today. CLINICAL HISTORY: Mesenteric ischemia, acute; left back, flank LLQ pain, assess for AAS. Left flank pain radiating to stomach. Constipation. FINDINGS: VASCULATURE: No acute finding. No occlusion or significant stenosis. AORTA: No acute finding. No abdominal aortic aneurysm. No dissection. CELIAC TRUNK: No acute finding. No occlusion or significant stenosis. SUPERIOR MESENTERIC ARTERY: No acute finding. No occlusion or significant stenosis. RENAL ARTERIES: No acute finding. No occlusion or significant stenosis. ILIAC ARTERIES: No acute finding. No occlusion or significant stenosis. LIVER: The liver is unremarkable. GALLBLADDER AND BILE DUCTS: Gallbladder is unremarkable. No biliary ductal dilatation. SPLEEN: The spleen is unremarkable. PANCREAS: The pancreas is unremarkable. ADRENAL GLANDS: Bilateral adrenal glands demonstrate no acute abnormality. KIDNEYS, URETERS AND BLADDER: Left-sided nephromegaly, hydronephrosis with delayed nephrograms. Left-sided hydroureter extends into the pelvis and is obstructed distally by a left pelvic  sidewall enhancing mass. The mass measures 5.9 x 4.5 cm (image 164/9). The mass abuts the left adnexa (image 166/9) with loss of a fat plane  between these 2 structures. No stones in the kidneys or ureters. No perinephric or periureteral stranding. Urinary bladder is unremarkable. GI AND BOWEL: Stomach appears nondilated. No pathologic dilatation of the large or small bowel loops. Moderate retained stool within the ascending colon. There are no secondary signs of bowel ischemia. REPRODUCTIVE: Uterus appears normal. Right adnexa is within normal limits. The left adnexa is abutts the left pelvic sidewall enhancing mass as described under KIDNEYS, URETERS AND BLADDER. PERITONEUM AND RETRPERITONEUM: No ascites or free air. No focal fluid collections. LUNG BASE: No acute abnormality. LYMPH NODES: No lymphadenopathy. BONES AND SOFT TISSUES: No acute abnormality of the bones. No aggressive lytic or sclerotic bone lesion. No acute soft tissue abnormality. IMPRESSION: 1. Obstructing 5.9 x 4.5 cm enhancing mass along the left pelvic sidewall causing left hydroureter, hydronephrosis, delayed nephrogram, and nephromegaly; mass abuts the left adnexa with loss of the intervening fat plane. This is highly concerning for malignant neoplasm. Etiology indeterminate. It's possible that this could be arising from a primary urothelial neoplasm of the left ureter. Other primary neoplasms or metastatic disease not excluded. Correlation with tissue sampling is advised. 2. Patent abdominal vasculature without signs of mesenteric ischemia. Electronically signed by: Waddell Calk MD 04/08/2024 07:40 AM EDT RP Workstation: [id number]   CT Renal Stone Study Result Date: 04/08/2024 EXAM: CT UROGRAM 04/08/2024 02:22:00 AM TECHNIQUE: CT of the abdomen and pelvis was performed without the administration of intravenous contrast as per CT urogram protocol. Multiplanar reformatted images as well as MIP urogram images are provided for review.  Automated exposure control, iterative reconstruction, and/or weight based adjustment of the mA/kV was utilized to reduce the radiation dose to as low as reasonably achievable. COMPARISON: None available. CLINICAL HISTORY: Abdominal/flank pain, stone suspected. POV from home for left flank pain radiating into left lower abdomen x 3 days and constipation since Sunday. Denies dysuria or hematuria. FINDINGS: LOWER CHEST: No acute abnormality. LIVER: The liver is unremarkable. GALLBLADDER AND BILE DUCTS: Gallbladder is unremarkable. No biliary ductal dilatation. SPLEEN: No acute abnormality. PANCREAS: No acute abnormality. ADRENAL GLANDS: No acute abnormality. KIDNEYS, URETERS AND BLADDER: Mild left hydroureteronephrosis. Hazy stranding about the left renal pelvis and proximal left ureter. No urinary calculi. Differential considerations include recently passed left stone or left pyelonephritis. Urinary bladder is unremarkable. GI AND BOWEL: Stomach demonstrates no acute abnormality. There is no bowel obstruction. PERITONEUM AND RETROPERITONEUM: No ascites. No free air. VASCULATURE: Aorta is normal in caliber. LYMPH NODES: No lymphadenopathy. REPRODUCTIVE ORGANS: No acute abnormality. BONES AND SOFT TISSUES: No acute osseous abnormality. No focal soft tissue abnormality. IMPRESSION: 1. Mild left hydroureteronephrosis with periureteral/peripelvic stranding, no urinary calculi. 2. Imaging findings are compatible with recently passed left ureteral stone versus acute left pyelonephritis. Electronically signed by: Norman Gatlin MD 04/08/2024 03:20 AM EDT RP Workstation: [id number]   US  Venous Img Lower Unilateral Left Result Date: 03/30/2024 CLINICAL DATA:  Left lower extremity swelling. EXAM: Left LOWER EXTREMITY VENOUS DOPPLER ULTRASOUND TECHNIQUE: Gray-scale sonography with compression, as well as color and duplex ultrasound, were performed to evaluate the deep venous system(s) from the level of the common femoral vein  through the popliteal and proximal calf veins. COMPARISON:  None Available. FINDINGS: VENOUS Normal compressibility of the common femoral, superficial femoral, and popliteal veins, as well as the visualized calf veins. Visualized portions of profunda femoral vein and great saphenous vein unremarkable. No filling defects to suggest DVT on grayscale or color Doppler imaging. Doppler waveforms show normal direction of venous flow, normal respiratory plasticity  and response to augmentation. Limited views of the contralateral common femoral vein are unremarkable. OTHER None. Limitations: none IMPRESSION: Negative. Electronically Signed   By: Vanetta Chou M.D.   On: 03/30/2024 19:15     Assessment and plan-   # Complicated UTI, left ureteral stent Continue IV antibiotics.-Vancomycin  and cefepime  Awaiting for urine culture to guide narrow down of antibiotics. On-call urologist suggested finishing a course of antibiotics. She will need to follow-up outpatient with Dr. Twylla.  # Diffuse large B-cell lymphoma, she did not tolerate chemotherapy well.   Recent CT scan showed slight decrease of pelvic mass.  Will further discuss with radiology.  Outpatient follow-up.  # Leukocytosis, multifactorial secondary to recent G-CSF, complicated UTI. # Pulmonary embolism, continue Eliquis  5mg  BID  # Sciatic pain/neuropathic pain -MRI lumbar showed no severe neuroforaminal stenosis that would need to be addressed surgically.  Follow-up outpatient with neurology.  Continue Lyrica  and Cymbalta   # Anemia with hemoglobin 10.5.  Monitor.  [employer]  Thank you for allowing me to participate in the care of this patient.   Zelphia Cap, MD, PhD Hematology Oncology 06/04/2024

## 2024-06-04 NOTE — TOC Progression Note (Addendum)
 Transition of Care [facility]) - Progression Note    Patient Details  Name: Stacey [name] MRN: [id number] Date of Birth: February 26, 1961  [facility] of [facility]) CM/SW Contact  K'La JINNY Ruts, LCSW Phone Number: 06/04/2024, 12:11 PM  Clinical Narrative:    Chart reviewed. Received a secure chat from PT about DME recommendation of a lightweight wheel chair or transport chair. The patient husband informed me that the insurance will not pay for it. The patient husband reports that he could possibly get a chair from someone he know.   SW will check with [employer] to see about payment plan. Please follow up with mitch to see about payment plan on Monday.   1:45- Spoke with mitch from [employer] and he stated that the patient insurance should have a list of DME agencies that will pay for the patient DME. Also, mitch reports that the DME the patient needs cost $330 out right. Please call the patient and inform them of this information.                    Expected Discharge Plan and Services                                               Social Drivers of Health (SDOH) Interventions SDOH Screenings   Food Insecurity: No Food Insecurity (06/03/2024)  Housing: Low Risk  (06/03/2024)  Transportation Needs: No Transportation Needs (06/03/2024)  Utilities: Not At Risk (06/03/2024)  Depression (PHQ2-9): Low Risk  (06/02/2024)  Financial Resource Strain: Low Risk  (03/30/2024)   Received from [facility]  Social Connections: Socially Integrated (05/17/2024)  Tobacco Use: Low Risk  (06/03/2024)    Readmission Risk Interventions     No data to display

## 2024-06-05 ENCOUNTER — Inpatient Hospital Stay

## 2024-06-05 ENCOUNTER — Other Ambulatory Visit: Payer: Self-pay

## 2024-06-05 ENCOUNTER — Telehealth: Payer: Self-pay | Admitting: Oncology

## 2024-06-05 DIAGNOSIS — D72829 Elevated white blood cell count, unspecified: Secondary | ICD-10-CM | POA: Diagnosis not present

## 2024-06-05 DIAGNOSIS — C8333 Diffuse large B-cell lymphoma, intra-abdominal lymph nodes: Secondary | ICD-10-CM | POA: Diagnosis not present

## 2024-06-05 DIAGNOSIS — N3001 Acute cystitis with hematuria: Secondary | ICD-10-CM | POA: Diagnosis not present

## 2024-06-05 DIAGNOSIS — M7989 Other specified soft tissue disorders: Secondary | ICD-10-CM

## 2024-06-05 DIAGNOSIS — A419 Sepsis, unspecified organism: Secondary | ICD-10-CM | POA: Diagnosis not present

## 2024-06-05 LAB — CBC
HCT: 27.6 % — ABNORMAL LOW (ref 36.0–46.0)
Hemoglobin: 9.2 g/dL — ABNORMAL LOW (ref 12.0–15.0)
MCH: 32.3 pg (ref 26.0–34.0)
MCHC: 33.3 g/dL (ref 30.0–36.0)
MCV: 96.8 fL (ref 80.0–100.0)
Platelets: 189 K/uL (ref 150–400)
RBC: 2.85 MIL/uL — ABNORMAL LOW (ref 3.87–5.11)
RDW: 16.5 % — ABNORMAL HIGH (ref 11.5–15.5)
WBC: 28.9 K/uL — ABNORMAL HIGH (ref 4.0–10.5)
nRBC: 0 % (ref 0.0–0.2)

## 2024-06-05 LAB — CREATININE, SERUM
Creatinine, Ser: 0.38 mg/dL — ABNORMAL LOW (ref 0.44–1.00)
GFR, Estimated: >60 mL/min (ref >60)

## 2024-06-05 NOTE — Assessment & Plan Note (Signed)
 Patient does not want TED hose or Lasix.  Will hold off on further fluids.  Receiving IV antibiotics.  Reviewed recent sonogram which was negative for DVT.

## 2024-06-05 NOTE — Progress Notes (Signed)
 OT Cancellation Note  Patient Details Name: Stacey [name] MRN: [id number] DOB: 1960/09/19   Cancelled Treatment:    Reason Eval/Treat Not Completed: Fatigue/lethargy limiting ability to participate;OT screened, no needs identified, will sign off. Chart reviewed, pt completing mobility with nursing and family as needed. Reports 10/10 pain and states near functional baseline, denies acute OT needs. Educated spouse and pt on w/c recommendations and OT follow up if new needs arise. Will sign off.  Elston Slot, M.S. OTR/L  06/05/24, 1:43 PM  ascom [phone]

## 2024-06-05 NOTE — Progress Notes (Signed)
 Progress Note   Patient: Stacey [name] FMW:[id number] DOB: February 08, 1961 DOA: 06/03/2024     2 DOS: the patient was seen and examined on 06/05/2024   Brief hospital course: 63 y.o. female with medical history significant of diffuse large B cell lymphoma with an obstructing mass along L pelvic side wall causing hydroureter/hydronephrosis requiring stent placement by urology (04/08/2024), pulmonary embolism, recent hospitalization for sepsis related to a UTI and multiple other medical issues here with lightheadedness and dysuria.    She underwent her second cycle of chemotherapy 11/19.  She got GCSF yesterday and IVF with concern for dehydration.  Last night, she woke up around 2-3 am and felt dizzy (like she was going to faint), weak, and lightheaded.  Also noticed dysuria and nausea.  Due to her symptoms, she told her husband she needed to come to the hospital.  She felt like she was going to pass out.  Since being here, pain meds have helped with suprapubic discomfort/dysuria.  Her lightheadedness is better after IVF, but still present.  She denies fevers, notes chills.  No cough/cold.  Notes what she describes as breathing heavy, but no SOB.  No CP.  Denies smoking, drinking.   11/23.  Patient having a lot of burning on urination and feels weak.  Admitted with sepsis secondary to urinary tract infection.  Has chronic weakness left lower extremity.  Assessment and Plan: * Severe sepsis (HCC) Present on admission with positive urine analysis (acute cystitis with hematuria), leukocytosis and tachypnea and elevated lactic acid.  Currently on Maxipime  and vancomycin .  With urine culture growing gram-negative rods will get rid of vancomycin .  Spoke with Dr. Francisca urologist and there is no urgent need to pull out the urological stent.  Recommends treating with antibiotics.  They will reschedule the appointment for tomorrow.  Leukocytosis Patient received medications to lift up the white count by  oncology and also on high-dose steroids.  Also with infection.  White blood cell count still high but trending in the right direction.  Diffuse large B-cell lymphoma (HCC) Seen by Dr. Babara here.  Follow-up with oncology as outpatient.  Swelling of left lower extremity Patient does not want TED hose or Lasix.  Will hold off on further fluids.  Receiving IV antibiotics.  Reviewed recent sonogram which was negative for DVT.  Weakness of left lower extremity Patient has neuropathy on Lyrica  and Cymbalta .  MRI lumbar spine does not show any metastatic disease or impingement.  History of pulmonary embolism On Eliquis         Subjective: Patient feeling little pain in her low abdomen.  Still having some burning.  States she is concerned about the swelling of her left lower extremity.  She does not want a TED hose secondary to neuropathy.  Recent sonogram that was negative.  Does not want Lasix.  Physical Exam: Vitals:   06/04/24 2100 06/04/24 2355 06/05/24 0409 06/05/24 0807  BP: 118/78 105/83 105/67 123/66  Pulse:   64 66  Resp: 18 16 13 16   Temp: 98.1 F (36.7 C) 97.7 F (36.5 C) 98.5 F (36.9 C) 98.7 F (37.1 C)  TempSrc: Oral Oral Oral   SpO2:   97% 96%  Weight:      Height:       Physical Exam HENT:     Head: Normocephalic.  Eyes:     General: Lids are normal.     Conjunctiva/sclera: Conjunctivae normal.  Cardiovascular:     Rate and Rhythm: Normal rate and  regular rhythm.     Heart sounds: Normal heart sounds, S1 normal and S2 normal.  Pulmonary:     Breath sounds: No decreased breath sounds, wheezing, rhonchi or rales.  Abdominal:     Palpations: Abdomen is soft.     Tenderness: There is abdominal tenderness in the suprapubic area.  Musculoskeletal:     Right lower leg: Swelling present.     Left lower leg: Swelling present.  Skin:    General: Skin is warm.     Findings: No rash.  Neurological:     Mental Status: She is alert and oriented to person, place, and  time.     Comments: Able to straight leg raise is better with the left leg today.     Data Reviewed: Sonogram bilateral lower extremity on 05/19/2024 was negative for DVT Creatinine 0.38, white blood cell count 28.9, hemoglobin 9.2, platelet count 189.  Family Communication: Spoke with husband at the bedside  Disposition: Status is: Inpatient Remains inpatient appropriate because: Urine culture growing gram-negative rods.  Need to see what the final diagnosis and sensitivity is prior to disposition to make sure we have on the right antibiotic.  Planned Discharge Destination: Home with Home Health    Time spent: 28 minutes  Author: Charlie Patterson, MD 06/05/2024 12:13 PM  For on call review [email or web address].

## 2024-06-05 NOTE — Telephone Encounter (Signed)
 Patient called to cancel appointment today due to being hospitalized.

## 2024-06-06 ENCOUNTER — Encounter: Payer: Self-pay | Admitting: Oncology

## 2024-06-06 ENCOUNTER — Ambulatory Visit: Admitting: Urology

## 2024-06-06 ENCOUNTER — Other Ambulatory Visit: Payer: Self-pay | Admitting: Oncology

## 2024-06-06 ENCOUNTER — Other Ambulatory Visit: Payer: Self-pay

## 2024-06-06 DIAGNOSIS — D72829 Elevated white blood cell count, unspecified: Secondary | ICD-10-CM | POA: Diagnosis not present

## 2024-06-06 DIAGNOSIS — A419 Sepsis, unspecified organism: Secondary | ICD-10-CM | POA: Diagnosis not present

## 2024-06-06 DIAGNOSIS — C8333 Diffuse large B-cell lymphoma, intra-abdominal lymph nodes: Secondary | ICD-10-CM | POA: Diagnosis not present

## 2024-06-06 DIAGNOSIS — N3001 Acute cystitis with hematuria: Secondary | ICD-10-CM | POA: Diagnosis not present

## 2024-06-06 LAB — CBC
HCT: 27 % — ABNORMAL LOW (ref 36.0–46.0)
Hemoglobin: 8.9 g/dL — ABNORMAL LOW (ref 12.0–15.0)
MCH: 31.4 pg (ref 26.0–34.0)
MCHC: 33 g/dL (ref 30.0–36.0)
MCV: 95.4 fL (ref 80.0–100.0)
Platelets: 143 K/uL — ABNORMAL LOW (ref 150–400)
RBC: 2.83 MIL/uL — ABNORMAL LOW (ref 3.87–5.11)
RDW: 16.1 % — ABNORMAL HIGH (ref 11.5–15.5)
WBC: 5.7 K/uL (ref 4.0–10.5)
nRBC: 0 % (ref 0.0–0.2)

## 2024-06-06 LAB — URINE CULTURE: Culture: >=100000 — AB

## 2024-06-06 MED ORDER — SODIUM CHLORIDE 0.9 % IV SOLN
1.0000 g | Freq: Once | INTRAVENOUS | Status: AC
  Administered 2024-06-06: 1 g via INTRAVENOUS
  Filled 2024-06-06: qty 10

## 2024-06-06 MED ORDER — POLYETHYLENE GLYCOL 3350 17 GM/SCOOP PO POWD
17.0000 g | Freq: Every day | ORAL | 1 refills | Status: AC | PRN
  Filled 2024-06-06: qty 238, 14d supply, fill #0

## 2024-06-06 MED ORDER — CEPHALEXIN 250 MG PO CAPS
ORAL_CAPSULE | ORAL | 0 refills | Status: DC
  Filled 2024-06-06: qty 50, 30d supply, fill #0
  Filled 2024-06-14: qty 50, 30d supply, fill #1

## 2024-06-06 NOTE — Discharge Summary (Signed)
 Physician Discharge Summary   Patient: Stacey [name] MRN: [id number] DOB: Feb 21, 1961  Admit date:     06/03/2024  Discharge date: 06/06/24  Discharge Physician: Charlie Patterson   PCP: [name], PA-C   Recommendations at discharge:   Follow-up PCP 5 days Urology appointment rescheduled for 12 3 at 10:15 AM Follow-up with oncology  Discharge Diagnoses: Principal Problem:   Severe sepsis (HCC) Active Problems:   Leukocytosis   Diffuse large B-cell lymphoma (HCC)   History of pulmonary embolism   Weakness of left lower extremity   Swelling of left lower extremity   Acute cystitis with hematuria    Hospital Course: 63 y.o. female with medical history significant of diffuse large B cell lymphoma with an obstructing mass along L pelvic side wall causing hydroureter/hydronephrosis requiring stent placement by urology (04/08/2024), pulmonary embolism, recent hospitalization for sepsis related to a UTI and multiple other medical issues here with lightheadedness and dysuria.    She underwent her second cycle of chemotherapy 11/19.  She got GCSF yesterday and IVF with concern for dehydration.  Last night, she woke up around 2-3 am and felt dizzy (like she was going to faint), weak, and lightheaded.  Also noticed dysuria and nausea.  Due to her symptoms, she told her husband she needed to come to the hospital.  She felt like she was going to pass out.  Since being here, pain meds have helped with suprapubic discomfort/dysuria.  Her lightheadedness is better after IVF, but still present.  She denies fevers, notes chills.  No cough/cold.  Notes what she describes as breathing heavy, but no SOB.  No CP.  Denies smoking, drinking.   11/23.  Patient having a lot of burning on urination and feels weak.  Admitted with sepsis secondary to urinary tract infection.  Has chronic weakness left lower extremity. 11/24.  Urine culture showing gram-negative rods identification to follow. 11/25.   Urine culture growing Klebsiella pneumoniae.  Patient switched over to Rocephin  and given a dose before discharge.  Will start Keflex  tomorrow for another 10 days then prophylactic Keflex  afterwards.  Assessment and Plan: * Severe sepsis (HCC) Present on admission with positive urine analysis (acute cystitis with hematuria), leukocytosis and tachypnea and elevated lactic acid. Spoke with Dr. Francisca urologist and there is no urgent need to pull out the urological stent.  Recommends treating with antibiotics.  Klebsiella pneumoniae growing out of urine culture.  Blood cultures negative for 3 days.  Sensitive to Rocephin .  Patient given a dose of Rocephin  prior to disposition.  Patient was on Maxipime  prior.  Will switch over to Keflex  500 mg 3 times daily for another 10 days and Keflex  prophylaxis 250 mg daily afterwards.  Leukocytosis Patient received medications to lift up the white count by oncology and also on high-dose steroids.  Also with infection.  White blood cell count normal range  Diffuse large B-cell lymphoma (HCC) Seen by Dr. Babara here.  Follow-up with oncology as outpatient.  Swelling of left lower extremity Patient does not want TED hose or Lasix.  Will hold off on further fluids.  Receiving IV antibiotics.  Reviewed recent sonogram which was negative for DVT.  Weakness of left lower extremity Patient has neuropathy on Lyrica  and Cymbalta .  MRI lumbar spine does not show any metastatic disease or impingement.  Patient declined home health.  History of pulmonary embolism On Eliquis          Consultants: Oncology, phone consultation with urology Procedures performed: None Disposition: Home  Diet recommendation:  Regular diet DISCHARGE MEDICATION: Allergies as of 06/06/2024       Reactions   Propofol  Nausea Only   Patient states medication can be tolerated with an antiemetic before taking        Medication List     STOP taking these medications    amoxicillin   500 MG capsule Commonly known as: AMOXIL        TAKE these medications    acyclovir  400 MG tablet Commonly known as: ZOVIRAX  Take 1 tablet (400 mg total) by mouth daily.   allopurinol  300 MG tablet Commonly known as: ZYLOPRIM  Take 1 tablet (300 mg total) by mouth daily.   cephALEXin  250 MG capsule Commonly known as: KEFLEX  Take 2 capsules (500mg  total) by mouth three times a day for 10 days, then one capsule (250mg ) by mouth daily afterwards   DULoxetine  30 MG capsule Commonly known as: CYMBALTA  Take 1 capsule (30 mg total) by mouth daily.   Eliquis  5 MG Tabs tablet Generic drug: apixaban  Take 2 tablets (10 mg total) by mouth 2 (two) times daily for 3 days, THEN 1 tablet (5 mg total) 2 (two) times daily. Start taking on: May 24, 2024   fentaNYL  25 MCG/HR Commonly known as: DURAGESIC  Place 1 patch onto the skin every 3 (three) days.   lidocaine -prilocaine  cream Commonly known as: EMLA  Apply to affected area once   magic mouthwash (multi-ingredient) oral suspension Swish and swallow 5-10 mLs 4 (four) times daily as needed for mouth pain.   methocarbamol  500 MG tablet Commonly known as: ROBAXIN  Take 500 mg by mouth every 8 (eight) hours as needed for muscle spasms.   omeprazole  20 MG capsule Commonly known as: PRILOSEC TAKE 1 CAPSULE (20 MG TOTAL) BY MOUTH 2 (TWO) TIMES DAILY BEFORE A MEAL.   ondansetron  8 MG tablet Commonly known as: Zofran  Take 1 tablet (8 mg total) by mouth every 8 (eight) hours as needed for nausea or vomiting. Start on the third day after cyclophosphamide  chemotherapy.   oxybutynin  10 MG 24 hr tablet Commonly known as: DITROPAN -XL TAKE 1 TABLET BY MOUTH EVERY DAY   Oxycodone  HCl 10 MG Tabs Take 1 tablet (10 mg total) by mouth every 4 (four) hours as needed (pain).   polyethylene glycol powder 17 GM/SCOOP powder Commonly known as: GLYCOLAX /MIRALAX  Take 17 g by mouth daily as needed. Dissolve 1 capful (17g) in 4-8 ounces of liquid and  take by mouth daily.   potassium chloride  SA 20 MEQ tablet Commonly known as: KLOR-CON  M Take 1 tablet (20 mEq total) by mouth daily.   predniSONE  20 MG tablet Commonly known as: DELTASONE  Take 5 tablets (100 mg total) by mouth as directed. Take with food on days 2-5 of chemotherapy.   pregabalin  75 MG capsule Commonly known as: Lyrica  Take 1 capsule (75 mg total) by mouth 2 (two) times daily.   prochlorperazine  10 MG tablet Commonly known as: COMPAZINE  Take 1 tablet (10 mg total) by mouth every 6 (six) hours as needed for nausea or vomiting.   tamsulosin  0.4 MG Caps capsule Commonly known as: FLOMAX  TAKE 1 CAPSULE BY MOUTH EVERY DAY   traZODone  50 MG tablet Commonly known as: DESYREL  Take 1 tablet (50 mg total) by mouth at bedtime as needed for sleep.        Follow-up Information     [name], PA-C. Go on 06/14/2024.   Specialty: Physician Assistant Why: Go at 2:00pm. Contact information: [address] [place] [address] [phone]  Francisca Redell BROCKS, MD Follow up.   Specialty: Urology Why: on 12/3 at 10:15am Contact information: [address] [place] [address] [phone]                Discharge Exam: Fredricka Weights   06/03/24 1056  Weight: 71.7 kg   Physical Exam HENT:     Head: Normocephalic.  Eyes:     General: Lids are normal.     Conjunctiva/sclera: Conjunctivae normal.  Cardiovascular:     Rate and Rhythm: Normal rate and regular rhythm.     Heart sounds: Normal heart sounds, S1 normal and S2 normal.  Pulmonary:     Breath sounds: No decreased breath sounds, wheezing, rhonchi or rales.  Abdominal:     Palpations: Abdomen is soft.     Tenderness: There is no abdominal tenderness.  Musculoskeletal:     Right lower leg: Swelling present.     Left lower leg: Swelling present.  Skin:    General: Skin is warm.     Findings: No rash.  Neurological:     Mental Status: She is alert and oriented to  person, place, and time.      Condition at discharge: stable  The results of significant diagnostics from this hospitalization (including imaging, microbiology, ancillary and laboratory) are listed below for reference.   Imaging Studies: CT ABDOMEN PELVIS W CONTRAST Addendum Date: 06/04/2024 ADDENDUM REPORT: 06/04/2024 12:49 ADDENDUM: Please note there is a left posterior pelvic infiltrative mass abutting the left piriformis muscle and infiltrating into the left greater sciatic fragment. The mass appears subjectively smaller compared to prior CT and measures approximately 6.0 x 2.4 cm (previously 5.8 x 3.3 cm). There is loss of fat plane between the mass and distal left ureter. Electronically Signed   By: Vanetta Chou M.D.   On: 06/04/2024 12:49   Result Date: 06/04/2024 CLINICAL DATA:  Generalized abdominal pain. EXAM: CT ABDOMEN AND PELVIS WITH CONTRAST TECHNIQUE: Multidetector CT imaging of the abdomen and pelvis was performed using the standard protocol following bolus administration of intravenous contrast. RADIATION DOSE REDUCTION: This exam was performed according to the departmental dose-optimization program which includes automated exposure control, adjustment of the mA and/or kV according to patient size and/or use of iterative reconstruction technique. CONTRAST:  OMNIPAQUE  IOHEXOL  300 MG/ML  SOLN COMPARISON:  CT abdomen pelvis dated 05/17/2024 FINDINGS: Lower chest: The visualized lung bases are clear. No intra-abdominal free air or free fluid. Hepatobiliary: No focal liver abnormality is seen. No gallstones, gallbladder wall thickening, or biliary dilatation. Pancreas: Unremarkable. No pancreatic ductal dilatation or surrounding inflammatory changes. Spleen: Normal in size without focal abnormality. Adrenals/Urinary Tract: The adrenal glands unremarkable. Left-sided pigtail ureteral catheter with tip in the left renal inferior pole collecting system and distal end in the urinary  bladder. Mild left hydronephrosis similar or minimally decreased. There is enhancement of the left urothelium suggestive of ascending UTI. The right kidney is unremarkable. The right ureter and urinary bladder appear unremarkable. Stomach/Bowel: Moderate stool throughout the colon. There is no bowel obstruction or active inflammation. Appendectomy. Vascular/Lymphatic: The abdominal aorta and IVC unremarkable. No portal venous gas. There is no adenopathy. Reproductive: The uterus is grossly unremarkable no suspicious adnexal masses. Other: None Musculoskeletal: Degenerative changes of the spine. No acute osseous pathology. IMPRESSION: 1. Left-sided pigtail ureteral catheter with similar or minimally decreased mild left hydronephrosis. 2. Enhancement of the left urothelium suggestive of ascending UTI. Correlation with urinalysis recommended. 3. No bowel obstruction. Electronically Signed: By: Vanetta  Radparvar M.D. On: 06/03/2024 13:30   DG Chest Port 1 View Result Date: 06/03/2024 CLINICAL DATA:  Questionable sepsis - evaluate for abnormality EXAM: PORTABLE CHEST 1 VIEW COMPARISON:  May 17, 2024 FINDINGS: The cardiomediastinal silhouette is unchanged in contour.RIGHT chest port with tip terminating over the SVC. No pleural effusion. No pneumothorax. No acute pleuroparenchymal abnormality. IMPRESSION: No acute cardiopulmonary abnormality. Electronically Signed   By: Corean Salter M.D.   On: 06/03/2024 11:56   MR Lumbar Spine W Wo Contrast Result Date: 05/31/2024 EXAM: MRI LUMBAR SPINE 05/31/2024 03:19:00 PM TECHNIQUE: Multiplanar multisequence MRI of the lumbar spine was performed with and without the administration of intravenous contrast. 7 mL (gadobutrol  (GADAVIST ) 1 MMOL/ML injection 7 mL GADOBUTROL  1 MMOL/ML IV SOLN). COMPARISON: CT abdomen and pelvis 05/17/2024. CLINICAL HISTORY: Metastatic disease evaluation; low back pain with numbness down left leg. History of lymphoma. FINDINGS: BONES AND  ALIGNMENT: 5 lumbar type vertebrae. Mild lumbar dextroscoliosis. Grade 1 retrolisthesis of L1-L2 and L2-L3 and grade 1 anterolisthesis of L4-L5. No fracture or suspicious marrow lesion. Mild bilateral facet edema at L4-L5. SPINAL CORD: The conus medullaris terminates at L1-L2 and is normal in signal. No evidence of epidural tumor. SOFT TISSUES: No paraspinal mass. INCIDENTAL FINDINGS: Mild left hydronephrosis, similar to the prior CT. DISC LEVELS: Disc desiccation primarily from L1-L2 through L4-L5. Severe disc space narrowing at L1-L2 and mild to moderate narrowing at L2-L3 and L3-L4. T12-L1: A broad central disc protrusion results in mild spinal stenosis. Patent neural foramina. L1-L2: Disc bulging and mild facet hypertrophy without significant stenosis. L2-L3: Circumferential disc bulging mildly eccentric to the left and moderate to severe facet hypertrophy result in mild left lateral recess stenosis and mild bilateral neural foraminal stenosis without spinal stenosis. L3-L4: Disc bulging, prominent epidural fat, and moderate to severe facet hypertrophy result in mild to moderate spinal stenosis, mild right greater than left lateral recess stenosis, and mild bilateral neural foraminal stenosis. L4-L5: Anterolisthesis with mild bulging of uncovered disc and severe facet hypertrophy result in mild bilateral lateral recess stenosis and mild bilateral neural foraminal stenosis without significant spinal stenosis. L5-S1: Severe facet hypertrophy without disc herniation or stenosis. IMPRESSION: 1. No evidence of metastatic disease in the lumbar spine. 2. Diffuse disc and facet degeneration with mild to moderate spinal stenosis at L3-L4. 3. Mild lateral recess and neural foraminal stenosis at L2-3 and L4-5. 4. Mild left hydronephrosis, similar to prior CT. Electronically signed by: Dasie Hamburg MD 05/31/2024 03:31 PM EST RP Workstation: [id number]   US  Venous Img Lower Bilateral (DVT) Result Date: 05/19/2024 CLINICAL  DATA:  Lower extremity pain. EXAM: BILATERAL LOWER EXTREMITY VENOUS DOPPLER ULTRASOUND TECHNIQUE: Gray-scale sonography with graded compression, as well as color Doppler and duplex ultrasound were performed to evaluate the lower extremity deep venous systems from the level of the common femoral vein and including the common femoral, femoral, profunda femoral, popliteal and calf veins including the posterior tibial, peroneal and gastrocnemius veins when visible. The superficial great saphenous vein was also interrogated. Spectral Doppler was utilized to evaluate flow at rest and with distal augmentation maneuvers in the common femoral, femoral and popliteal veins. COMPARISON:  None Available. FINDINGS: RIGHT LOWER EXTREMITY Common Femoral Vein: No evidence of thrombus. Normal compressibility, respiratory phasicity and response to augmentation. Saphenofemoral Junction: No evidence of thrombus. Normal compressibility and flow on color Doppler imaging. Profunda Femoral Vein: No evidence of thrombus. Normal compressibility and flow on color Doppler imaging. Femoral Vein: No evidence of thrombus. Normal compressibility, respiratory phasicity and  response to augmentation. Popliteal Vein: No evidence of thrombus. Normal compressibility, respiratory phasicity and response to augmentation. Calf Veins: No evidence of thrombus. Normal compressibility and flow on color Doppler imaging. Superficial Great Saphenous Vein: No evidence of thrombus. Normal compressibility. Venous Reflux:  None. Other Findings: No evidence of superficial thrombophlebitis or abnormal fluid collection. LEFT LOWER EXTREMITY Common Femoral Vein: No evidence of thrombus. Normal compressibility, respiratory phasicity and response to augmentation. Saphenofemoral Junction: No evidence of thrombus. Normal compressibility and flow on color Doppler imaging. Profunda Femoral Vein: No evidence of thrombus. Normal compressibility and flow on color Doppler imaging.  Femoral Vein: No evidence of thrombus. Normal compressibility, respiratory phasicity and response to augmentation. Popliteal Vein: No evidence of thrombus. Normal compressibility, respiratory phasicity and response to augmentation. Calf Veins: No evidence of thrombus. Normal compressibility and flow on color Doppler imaging. Superficial Great Saphenous Vein: No evidence of thrombus. Normal compressibility. Venous Reflux:  None. Other Findings: No evidence of superficial thrombophlebitis or abnormal fluid collection. IMPRESSION: No evidence of deep venous thrombosis in either lower extremity. Electronically Signed   By: Marcey Moan M.D.   On: 05/19/2024 17:20   CT Angio Chest Pulmonary Embolism (PE) W or WO Contrast Result Date: 05/18/2024 CLINICAL DATA:  Concern for pulmonary embolism.  Lymphoma. EXAM: CT ANGIOGRAPHY CHEST WITH CONTRAST TECHNIQUE: Multidetector CT imaging of the chest was performed using the standard protocol during bolus administration of intravenous contrast. Multiplanar CT image reconstructions and MIPs were obtained to evaluate the vascular anatomy. RADIATION DOSE REDUCTION: This exam was performed according to the departmental dose-optimization program which includes automated exposure control, adjustment of the mA and/or kV according to patient size and/or use of iterative reconstruction technique. CONTRAST:  50mL OMNIPAQUE  IOHEXOL  350 MG/ML SOLN COMPARISON:  Chest radiograph dated 05/17/2024. FINDINGS: Cardiovascular: There is no cardiomegaly or pericardial effusion. The thoracic aorta is unremarkable. The origins of the great vessels of the aortic arch are patent. Right-sided Port-A-Cath with tip in the right atrium at the cavoatrial junction. Linear nonocclusive right lower lobe subsegmental pulmonary artery embolus, age indeterminate. No CT evidence of right heart straining. Mediastinum/Nodes: No hilar or mediastinal adenopathy. The esophagus and the thyroid gland are grossly  unremarkable no mediastinal fluid collection. Lungs/Pleura: Minimal bibasilar linear atelectasis. No focal consolidation, pleural effusion, or pneumothorax. The central airways are patent. Upper Abdomen: No acute abnormality. Musculoskeletal: Degenerative changes of spine. No acute osseous pathology. Bilateral shoulder arthroplasties. Review of the MIP images confirms the above findings. IMPRESSION: 1. Age indeterminate linear nonocclusive right lower lobe subsegmental pulmonary artery embolus. No CT evidence of right heart straining. 2. No focal consolidation. These results will be called to the ordering clinician or representative by the Radiologist Assistant, and communication documented in the PACS or [employer]. Electronically Signed   By: Vanetta Chou M.D.   On: 05/18/2024 14:56   CT ABDOMEN PELVIS W CONTRAST Result Date: 05/17/2024 CLINICAL DATA:  Abdominal pain.  Concern for kidney stone. EXAM: CT ABDOMEN AND PELVIS WITH CONTRAST TECHNIQUE: Multidetector CT imaging of the abdomen and pelvis was performed using the standard protocol following bolus administration of intravenous contrast. RADIATION DOSE REDUCTION: This exam was performed according to the departmental dose-optimization program which includes automated exposure control, adjustment of the mA and/or kV according to patient size and/or use of iterative reconstruction technique. CONTRAST:  OMNIPAQUE  IOHEXOL  300 MG/ML  SOLN COMPARISON:  CT abdomen pelvis dated 04/08/2024. FINDINGS: Lower chest: The visualized lung bases are clear. Tiny nonocclusive embolus in the right  lower lobe subsegmental branch (2/2). Chest CT may provide better evaluation. No intra-abdominal free air or free fluid. Hepatobiliary: The liver is unremarkable. No biliary dilatation. The gallbladder is unremarkable. Pancreas: Unremarkable. No pancreatic ductal dilatation or surrounding inflammatory changes. Spleen: Normal in size without focal abnormality.  Adrenals/Urinary Tract: The adrenal glands unremarkable. Left ureteral stent with proximal tip in the inferior pole collecting system and distal end in the urinary bladder. There is mild left hydronephrosis. There is urothelial enhancement of the left renal collecting system and ureter suggestive of ascending UTI. No stone identified. The right kidney, right ureter, and urinary bladder appear unremarkable. Stomach/Bowel: Moderate stool throughout the colon. There is no bowel obstruction or active inflammation. Appendectomy. Vascular/Lymphatic: The abdominal aorta and IVC unremarkable. No portal venous gas. There is no adenopathy. Reproductive: The uterus is grossly unremarkable. Other: Interval decrease in the masslike soft tissues thickening of the left pelvic sidewall measuring approximately 5.8 x 3.3 cm in greatest axial dimensions (previously 6.7 x 4.8 cm). A 1.4 x 1.2 cm rim enhancing low attenuating focus within this soft tissue mass (coronal 52/5) suspicious for an area of phlegmonous change or developing abscess. Musculoskeletal: Degenerative changes of the spine. No acute osseous pathology. IMPRESSION: 1. Left ureteral stent with mild left hydronephrosis and findings suggestive of ascending UTI. Correlation with urinalysis recommended. 2. Interval decrease in the masslike soft tissues thickening of the left pelvic sidewall. A 1.4 x 1.2 cm rim enhancing low attenuating focus within this soft tissue mass suspicious for an area of phlegmonous change or developing abscess. 3. Tiny nonocclusive embolus in the right lower lobe subsegmental branch. Chest CT may provide better evaluation. These results were called by telephone at the time of interpretation on 05/17/2024 at 6:28 pm to provider LAMAR PRICE , who verbally acknowledged these results. Electronically Signed   By: Vanetta Chou M.D.   On: 05/17/2024 18:39   DG Chest Port 1 View Result Date: 05/17/2024 CLINICAL DATA:  Sepsis. EXAM: PORTABLE CHEST 1  VIEW COMPARISON:  Chest radiograph dated 09/17/2012 FINDINGS: Right-sided PICC with tip over central SVC. No focal consolidation, pleural effusion, pneumothorax. The cardiac silhouette is within limits. No acute osseous pathology. Bilateral shoulder arthroplasties. IMPRESSION: No active disease. Electronically Signed   By: Vanetta Chou M.D.   On: 05/17/2024 16:38    Microbiology: Results for orders placed or performed during the hospital encounter of 06/03/24  Culture, blood (Routine x 2)     Status: None (Preliminary result)   Collection Time: 06/03/24 10:58 AM   Specimen: BLOOD RIGHT HAND  Result Value Ref Range Status   Specimen Description   Final    BLOOD RIGHT HAND Performed at [facility], [address] [place], [place] [address]    Special Requests   Final    BOTTLES DRAWN AEROBIC AND ANAEROBIC Blood Culture results may not be optimal due to an excessive volume of blood received in culture bottles Performed at [facility], [address], [place], [place] [address]    Culture  Setup Time   Final    NO GROWTH 1 DAY Performed at [facility], [address] [place], [place] [address]    Culture   Final    NO GROWTH 3 DAYS Performed at [facility], [address] [place] [place], [place] [address]    Report Status PENDING  Incomplete  Urine Culture     Status: Abnormal   Collection Time: 06/03/24 10:58 AM   Specimen: Urine,  Random  Result Value Ref Range Status   Specimen Description   Final    URINE, RANDOM Performed at [facility], [address] [place], [place] [address]    Special Requests   Final    NONE Reflexed from [id number] Performed at [facility], [address] [place], [place] [address]    Culture >=100,000 COLONIES/mL KLEBSIELLA PNEUMONIAE (A)  Final   Report Status 06/06/2024 FINAL  Final   Organism ID, Bacteria KLEBSIELLA PNEUMONIAE (A)  Final      Susceptibility   Klebsiella pneumoniae - MIC*     AMPICILLIN  >=32 RESISTANT Resistant     CEFAZOLIN  (URINE) Value in next row Sensitive      2 SENSITIVEThis is a modified FDA-approved test that has been validated and its performance characteristics determined by the reporting laboratory.  This laboratory is certified under the Clinical Laboratory Improvement Amendments CLIA as qualified to perform high complexity clinical laboratory testing.    CEFEPIME  Value in next row Sensitive      2 SENSITIVEThis is a modified FDA-approved test that has been validated and its performance characteristics determined by the reporting laboratory.  This laboratory is certified under the Clinical Laboratory Improvement Amendments CLIA as qualified to perform high complexity clinical laboratory testing.    ERTAPENEM Value in next row Sensitive      2 SENSITIVEThis is a modified FDA-approved test that has been validated and its performance characteristics determined by the reporting laboratory.  This laboratory is certified under the Clinical Laboratory Improvement Amendments CLIA as qualified to perform high complexity clinical laboratory testing.    CEFTRIAXONE  Value in next row Sensitive      2 SENSITIVEThis is a modified FDA-approved test that has been validated and its performance characteristics determined by the reporting laboratory.  This laboratory is certified under the Clinical Laboratory Improvement Amendments CLIA as qualified to perform high complexity clinical laboratory testing.    CIPROFLOXACIN Value in next row Sensitive      2 SENSITIVEThis is a modified FDA-approved test that has been validated and its performance characteristics determined by the reporting laboratory.  This laboratory is certified under the Clinical Laboratory Improvement Amendments CLIA as qualified to perform high complexity clinical laboratory testing.    GENTAMICIN Value in next row Sensitive      2 SENSITIVEThis is a modified FDA-approved test that has been validated and its  performance characteristics determined by the reporting laboratory.  This laboratory is certified under the Clinical Laboratory Improvement Amendments CLIA as qualified to perform high complexity clinical laboratory testing.    NITROFURANTOIN Value in next row Resistant      2 SENSITIVEThis is a modified FDA-approved test that has been validated and its performance characteristics determined by the reporting laboratory.  This laboratory is certified under the Clinical Laboratory Improvement Amendments CLIA as qualified to perform high complexity clinical laboratory testing.    TRIMETH/SULFA Value in next row Sensitive      2 SENSITIVEThis is a modified FDA-approved test that has been validated and its performance characteristics determined by the reporting laboratory.  This laboratory is certified under the Clinical Laboratory Improvement Amendments CLIA as qualified to perform high complexity clinical laboratory testing.    AMPICILLIN /SULBACTAM Value in next row Sensitive      2 SENSITIVEThis is a modified FDA-approved test that has been validated and its performance characteristics determined by the reporting laboratory.  This laboratory is certified under the Clinical Laboratory Improvement Amendments CLIA as qualified to perform  high complexity clinical laboratory testing.    PIP/TAZO Value in next row Sensitive      <=4 SENSITIVEThis is a modified FDA-approved test that has been validated and its performance characteristics determined by the reporting laboratory.  This laboratory is certified under the Clinical Laboratory Improvement Amendments CLIA as qualified to perform high complexity clinical laboratory testing.    MEROPENEM  Value in next row Sensitive      <=4 SENSITIVEThis is a modified FDA-approved test that has been validated and its performance characteristics determined by the reporting laboratory.  This laboratory is certified under the Clinical Laboratory Improvement Amendments CLIA as  qualified to perform high complexity clinical laboratory testing.    * >=100,000 COLONIES/mL KLEBSIELLA PNEUMONIAE  Culture, blood (Routine x 2)     Status: None (Preliminary result)   Collection Time: 06/03/24 11:03 AM   Specimen: BLOOD RIGHT FOREARM  Result Value Ref Range Status   Specimen Description   Final    BLOOD RIGHT FOREARM Performed at [facility], [address] [place], [place] [address]    Special Requests   Final    BOTTLES DRAWN AEROBIC AND ANAEROBIC Blood Culture adequate volume Performed at [facility], [address] [place], [place] [address]    Culture  Setup Time   Final    NO GROWTH 1 DAY Performed at [facility], [address], [place], [place] [address]    Culture   Final    NO GROWTH 3 DAYS Performed at [facility], [address] [place] [place], [place] [address]    Report Status PENDING  Incomplete    Labs: CBC: Recent Labs  Lab 05/31/24 0818 06/02/24 1059 06/03/24 1058 06/04/24 0455 06/05/24 0334 06/06/24 0618  WBC 4.2 6.5 41.0* 35.0* 28.9* 5.7  NEUTROABS 2.7 5.8 37.6*  --   --   --   HGB 11.3* 10.4* 10.5* 8.7* 9.2* 8.9*  HCT 33.7* 30.5* 31.6* 26.3* 27.6* 27.0*  MCV 93.4 93.0 95.8 96.3 96.8 95.4  PLT 426* 392 336 216 189 143*   Basic Metabolic Panel: Recent Labs  Lab 05/31/24 0818 06/02/24 1059 06/03/24 1058 06/04/24 0455 06/05/24 0335  NA 136 134* 138 137  --   K 3.4* 3.6 3.4* 3.8  --   CL 101 101 104 106  --   CO2 25 23 23 26   --   GLUCOSE 138* 139* 120* 91  --   BUN 11 24* 17 12  --   CREATININE 0.57 0.59 0.51 0.39* 0.38*  CALCIUM 8.9 8.7* 8.9 8.3*  --    Liver Function Tests: Recent Labs  Lab 05/31/24 0818 06/02/24 1059 06/03/24 1058 06/04/24 0455  AST 21 15 16  13*  ALT 14 11 10 7   ALKPHOS 49 41 63 65  BILITOT 0.5 0.5 0.7 0.5  PROT 6.9 6.4* 6.4* 5.1*  ALBUMIN 3.7 3.5 3.9 3.1*   CBG: No results for input(s): GLUCAP in the last 168 hours.  Discharge time spent: greater than  30 minutes.  Signed: Charlie Patterson, MD [facility] 06/06/2024

## 2024-06-07 ENCOUNTER — Inpatient Hospital Stay

## 2024-06-07 ENCOUNTER — Other Ambulatory Visit: Payer: Self-pay

## 2024-06-07 ENCOUNTER — Inpatient Hospital Stay (HOSPITAL_BASED_OUTPATIENT_CLINIC_OR_DEPARTMENT_OTHER): Admitting: Oncology

## 2024-06-07 ENCOUNTER — Encounter: Payer: Self-pay | Admitting: Oncology

## 2024-06-07 VITALS — BP 105/73 | HR 94 | Resp 18 | Wt 167.3 lb

## 2024-06-07 DIAGNOSIS — N390 Urinary tract infection, site not specified: Secondary | ICD-10-CM

## 2024-06-07 DIAGNOSIS — I2699 Other pulmonary embolism without acute cor pulmonale: Secondary | ICD-10-CM

## 2024-06-07 DIAGNOSIS — C8330 Diffuse large B-cell lymphoma, unspecified site: Secondary | ICD-10-CM

## 2024-06-07 DIAGNOSIS — G893 Neoplasm related pain (acute) (chronic): Secondary | ICD-10-CM

## 2024-06-07 DIAGNOSIS — G629 Polyneuropathy, unspecified: Secondary | ICD-10-CM

## 2024-06-07 DIAGNOSIS — Z5112 Encounter for antineoplastic immunotherapy: Secondary | ICD-10-CM | POA: Diagnosis not present

## 2024-06-07 LAB — CBC WITH DIFFERENTIAL (CANCER CENTER ONLY)
Abs Immature Granulocytes: 0.13 K/uL — ABNORMAL HIGH (ref 0.00–0.07)
Basophils Absolute: 0 K/uL (ref 0.0–0.1)
Basophils Relative: 1 %
Eosinophils Absolute: 0.1 K/uL (ref 0.0–0.5)
Eosinophils Relative: 3 %
HCT: 30.1 % — ABNORMAL LOW (ref 36.0–46.0)
Hemoglobin: 10.1 g/dL — ABNORMAL LOW (ref 12.0–15.0)
Immature Granulocytes: 6 %
Lymphocytes Relative: 19 %
Lymphs Abs: 0.5 K/uL — ABNORMAL LOW (ref 0.7–4.0)
MCH: 31.6 pg (ref 26.0–34.0)
MCHC: 33.6 g/dL (ref 30.0–36.0)
MCV: 94.1 fL (ref 80.0–100.0)
Monocytes Absolute: 0.6 K/uL (ref 0.1–1.0)
Monocytes Relative: 25 %
Neutro Abs: 1.1 K/uL — ABNORMAL LOW (ref 1.7–7.7)
Neutrophils Relative %: 46 %
Platelet Count: 125 K/uL — ABNORMAL LOW (ref 150–400)
RBC: 3.2 MIL/uL — ABNORMAL LOW (ref 3.87–5.11)
RDW: 15.5 % (ref 11.5–15.5)
WBC Count: 2.3 K/uL — ABNORMAL LOW (ref 4.0–10.5)
nRBC: 0.9 % — ABNORMAL HIGH (ref 0.0–0.2)

## 2024-06-07 LAB — CMP (CANCER CENTER ONLY)
ALT: 16 U/L (ref 0–44)
AST: 17 U/L (ref 15–41)
Albumin: 3.5 g/dL (ref 3.5–5.0)
Alkaline Phosphatase: 61 U/L (ref 38–126)
Anion gap: 9 (ref 5–15)
BUN: 11 mg/dL (ref 8–23)
CO2: 26 mmol/L (ref 22–32)
Calcium: 8.4 mg/dL — ABNORMAL LOW (ref 8.9–10.3)
Chloride: 98 mmol/L (ref 98–111)
Creatinine: 0.36 mg/dL — ABNORMAL LOW (ref 0.44–1.00)
GFR, Estimated: >60 mL/min (ref >60)
Glucose, Bld: 116 mg/dL — ABNORMAL HIGH (ref 70–99)
Potassium: 3.6 mmol/L (ref 3.5–5.1)
Sodium: 133 mmol/L — ABNORMAL LOW (ref 135–145)
Total Bilirubin: 0.7 mg/dL (ref 0.0–1.2)
Total Protein: 6.2 g/dL — ABNORMAL LOW (ref 6.5–8.1)

## 2024-06-07 MED ORDER — SODIUM CHLORIDE 0.9 % IV SOLN
Freq: Once | INTRAVENOUS | Status: AC
  Filled 2024-06-07: qty 250

## 2024-06-07 NOTE — Assessment & Plan Note (Signed)
 Sciatic pain/neuropathic pain.   Pre existing symptoms prior to chemotherapy. Left hip/buttock pain has improved after chemo, however worse of calf and foot pain is worse. Suspect that she has radiculopathy. Acute chemotherapy neuro toxicity is possible, unilateral symptom favors radiculopathy.  MRI lumbar results were reviewed. Neurology recommendation was reviewed. no severe neuro-foraminal stenosis that would need to be addressed surgically. .  Discontinue Vincristine   Continue Liyrica and Cymbalta .

## 2024-06-07 NOTE — Assessment & Plan Note (Signed)
 Continue Oxycodone  5mg  Q4h PRN.  Fentanyl  patch 25mcg

## 2024-06-07 NOTE — Progress Notes (Signed)
 Hematology/Oncology Progress note [phone] [phone]      CHIEF COMPLAINTS/PURPOSE OF CONSULTATION:  DLBCL   ASSESSMENT & PLAN:   Cancer Staging  Diffuse large B-cell lymphoma (HCC) Staging form: Hodgkin and Non-Hodgkin Lymphoma, AJCC 8th Edition - Clinical stage from 04/26/2024: Stage II (Diffuse large B-cell lymphoma) - Signed by Babara Call, MD on 04/26/2024   Diffuse large B-cell lymphoma (HCC) DLBCL of left pelvic wall mass, Atypical t (8; 14), BCL2 BCL6 monosomy.  Negative for BCL2, BCL6 and MYC rearrangement PET scan evaluation showed mild hypermetabolism in spleen.  Nonspecific.  Likely stage I/II disease. Bone marrow biopsy is negative.   IPI score 1-2 Baseline Echo showed LVEF 55-60% Grade I diastolic dysfunction   10/29/205 Cycle 1 R CHOP  - hospitalized due to neutropenic fever, complicated UTI 05/31/2024 cycle 2 R -dose reduced CHP with D3 GCSF.  Hold Vincristine  due to  left foot neuropathy Hospitalized due to complicated UTI I recommend to hold of cycle 3 given her poor tolerability. Repeat PET for evaluation of treatment response.  She will see me in mid December to review PET and decide next steps.   TLS prophylaxis Allopurinol  300mg  daily- finish current supply Acyclovir  400mg  daily for prophylaxis.  PRN IVF hydration sessions.   Neoplasm related pain Continue Oxycodone  5mg  Q4h PRN.  Fentanyl  patch 25mcg   Neuropathy Sciatic pain/neuropathic pain.   Pre existing symptoms prior to chemotherapy. Left hip/buttock pain has improved after chemo, however worse of calf and foot pain is worse. Suspect that she has radiculopathy. Acute chemotherapy neuro toxicity is possible, unilateral symptom favors radiculopathy.  MRI lumbar results were reviewed. Neurology recommendation was reviewed. no severe neuro-foraminal stenosis that would need to be addressed surgically. .  Discontinue Vincristine   Continue Liyrica and Cymbalta .    Pulmonary  embolism (HCC) Eliquis  5mg  BID  Urinary tract infection without hematuria To finish 10 days of Keflex  500mg  TID followed by 250mg  daily as prophylaxis    Orders Placed This Encounter  Procedures   CBC with Differential (Cancer Center Only)    Standing Status:   Future    Number of Occurrences:   1    Expected Date:   06/07/2024    Expiration Date:   09/05/2024   CBC with Differential (Cancer Center Only)    Standing Status:   Future    Number of Occurrences:   1    Expected Date:   06/07/2024    Expiration Date:   09/05/2024   CMP (Cancer Center only)    Standing Status:   Future    Number of Occurrences:   1    Expected Date:   06/07/2024    Expiration Date:   09/05/2024   CBC with Differential (Cancer Center Only)    Standing Status:   Future    Expected Date:   07/04/2024    Expiration Date:   10/02/2024   CMP (Cancer Center only)    Standing Status:   Future    Expected Date:   07/04/2024    Expiration Date:   10/02/2024   Lactate dehydrogenase    Standing Status:   Future    Expected Date:   07/04/2024    Expiration Date:   10/02/2024   Follow up per LOS All questions were answered. The patient knows to call the clinic with any problems, questions or concerns.  Call Babara, MD, PhD [facility] Hematology Oncology 06/07/2024    HISTORY OF PRESENTING ILLNESS:  Stacey [name] [address]  y.o. female presents to establish care for large B-cell lymphoma I have reviewed her chart and materials related to her cancer extensively and collaborated history with the patient. Summary of oncologic history is as follows: Oncology History  Diffuse large B-cell lymphoma (HCC)  04/08/2024 Imaging   CT renal stone study showed  1. Mild left hydroureteronephrosis with periureteral/peripelvic stranding, no urinary calculi. 2. Imaging findings are compatible with recently passed left ureteral stone versus acute left pyelonephritis.   04/08/2024 Imaging   CT angiogram abdomen/pelvis with and  without contrast  1. Obstructing 5.9 x 4.5 cm enhancing mass along the left pelvic sidewall causing left hydroureter, hydronephrosis, delayed nephrogram, and nephromegaly; mass abuts the left adnexa with loss of the intervening fat plane. This is highly concerning for malignant neoplasm. Etiology indeterminate. It's possible that this could be arising from a primary urothelial neoplasm of the left ureter. Other primary neoplasms or metastatic disease not excluded. Correlation with tissue sampling is advised. 2. Patent abdominal vasculature without signs of mesenteric ischemia.   04/24/2024 Imaging   PET scan showed  1. The posterior left pelvic sidewall mass is hypermetabolic, consistent with active lymphoma. 2. Mild splenic hypermetabolism relative to the liver. No splenomegaly. Cannot exclude lymphomatous involvement. 3. New small right pleural effusion. 4. No active lymphoma within the chest or neck. Mildly decreased sensitivity exam secondary to hypermetabolic brown fat. 5. Age advanced coronary artery atherosclerosis. Recommend assessment of coronary risk factors. 6.  Aortic Atherosclerosis    04/26/2024 Initial Diagnosis   Lymphoma (HCC)  Symptoms began on 03/10/2024 with pain radiating down the left leg, associated with the sciatic nerve. Initially managed with high doses of ibuprofen, which provided some relief, but the pain worsened within a week, leading to an emergency room visit where she received a steroid injection in her lower back.  04/08/2024 she returned to the hospital due to increased pain. A CT scan revealed a pelvic mass obstructing the ureter, and a stent was placed by a urologist.  Patient underwent biopsy of pelvis mass Pathology showed 1. Lymph node, needle/core biopsy, 18G cores infilatratic left pelvic sidewall mass :       LARGE B-CELL LYMPHOMA.  soft      tissue with atypical lymphoid infiltrate comprised predominantly of medium to      large sized  cells.  Abundant mitotic figures are present.  Focal areas of      necrosis are noted.  Immunohistochemical stains reveal the malignant cells are      positive for CD20, PAX5, BCL6 (subset), MUM1, Bcl-2 and are negative for CD10      and cyclin D1.  CD30 does not show any significant expression. CD15 highlights      neutrophils. The Ki-67 proliferation index is high approximately 70%.  This lymphoma has a activated B-cell immunophenotype.   EBER-ish is pending and will be reported in an addendum.  Also,High-grade B-cell lymphoma FISH panel will be   ordered to exclude a double/triple hit lymphoma and results will be reported in an addendum   She has experienced unintentional weight loss of 18 pounds since May, without any changes in diet or exercise. No night sweats, low-grade fever, or excessive sweating at night. Baseline health includes chronic back pain, acid reflux, prediabetes, and constipation.  She is taking gabapentin  100 mg once daily for nerve pain, described as sometimes dull and sometimes sharp. Oxycodone  5 mg every six hours for pain management, which helps reduce the pain slightly.  She notes reduced appetite,  numbness, and tingling in her leg. Also mentions leg swelling, particularly at night when less active.   04/26/2024 Cancer Staging   Staging form: Hodgkin and Non-Hodgkin Lymphoma, AJCC 8th Edition - Clinical stage from 04/26/2024: Stage II (Diffuse large B-cell lymphoma) - Signed by Babara Call, MD on 04/26/2024 Stage prefix: Initial diagnosis   05/01/2024 Bone Marrow Biopsy   Bone marrow biopsy results showed  BONE MARROW, ASPIRATE, CLOT, CORE:  - Mildly hypocellular bone marrow with otherwise orderly trilineage hematopoiesis  - No morphologic or immunophenotypic evidence of the patient's recently diagnosed large B-cell lymphoma    05/10/2024 -  Chemotherapy   Patient is on Treatment Plan : NON-HODGKINS LYMPHOMA R-CHOP q21d x 4 cycles      Discussed the use of AI  scribe software for clinical note transcription with the patient, who gave verbal consent to proceed.   S/p cycle 2 dose reduced R CHP. She was hospitalized due to complicated UTI. She was treated with IV antibiotics, and plan to finish 10 days of  Keflex  500mg  TID followed by 250mg  daily as prophylaxis She report no fever or chills. Denies dysuria symptoms today.   She reports appetite starts to kick in today.  She has lost weight.   MEDICAL HISTORY:  Past Medical History:  Diagnosis Date   Arthritis    left shoulder, neck, lower back  (01/11/2018)   Cancer (HCC)    Chronic lower back pain    Family history of adverse reaction to anesthesia    Mother has nausea   GERD (gastroesophageal reflux disease)    PONV (postoperative nausea and vomiting)    Pre-diabetes    UTI (urinary tract infection) 03/20/2021    SURGICAL HISTORY: Past Surgical History:  Procedure Laterality Date   APPENDECTOMY     CYSTOSCOPY W/ URETERAL STENT PLACEMENT Left 04/08/2024   Procedure: CYSTOSCOPY, WITH RETROGRADE PYELOGRAM AND URETERAL STENT INSERTION;  Surgeon: Watt Rush, MD;  Location: [facility] ORS;  Service: Urology;  Laterality: Left;   IR IMAGING GUIDED PORT INSERTION  05/01/2024   JOINT REPLACEMENT     TOTAL SHOULDER ARTHROPLASTY Left 01/11/2018   TOTAL SHOULDER ARTHROPLASTY Left 01/11/2018   Procedure: LEFT TOTAL SHOULDER ARTHROPLASTY;  Surgeon: Sharl Selinda Dover, MD;  Location: [facility] OR;  Service: Orthopedics;  Laterality: Left;  2.5 hrs   TOTAL SHOULDER ARTHROPLASTY Right 02/08/2020   Procedure: TOTAL SHOULDER ARTHROPLASTY;  Surgeon: Sharl Selinda Dover, MD;  Location: [facility] OR;  Service: Orthopedics;  Laterality: Right;  2.5 hrs RNFA    SOCIAL HISTORY: Social History   Socioeconomic History   Marital status: Married    Spouse name: Marshayla Mitschke   Number of children: Not on file   Years of education: Not on file   Highest education level: Not on file  Occupational History   Not on file   Tobacco Use   Smoking status: Never   Smokeless tobacco: Never  Vaping Use   Vaping status: Never Used  Substance and Sexual Activity   Alcohol use: Not Currently    Comment: 01/11/2018 might have 1 drink/month   Drug use: Never   Sexual activity: Yes  Other Topics Concern   Not on file  Social History Narrative   Not on file   Social Drivers of Health   Financial Resource Strain: Low Risk  (03/30/2024)   Received from [facility]   Overall Financial Resource Strain (CARDIA)    Difficulty of Paying Living Expenses: Not hard at all  Food Insecurity:  No Food Insecurity (06/03/2024)   Hunger Vital Sign    Worried About Running Out of Food in the Last Year: Never true    Ran Out of Food in the Last Year: Never true  Transportation Needs: No Transportation Needs (06/03/2024)   PRAPARE - Administrator, Civil Service (Medical): No    Lack of Transportation (Non-Medical): No  Physical Activity: Not on file  Stress: Not on file  Social Connections: Socially Integrated (05/17/2024)   Social Connection and Isolation Panel    Frequency of Communication with Friends and Family: Three times a week    Frequency of Social Gatherings with Friends and Family: Three times a week    Attends Religious Services: 1 to 4 times per year    Active Member of Clubs or Organizations: Yes    Attends Banker Meetings: 1 to 4 times per year    Marital Status: Married  Catering Manager Violence: Not At Risk (06/03/2024)   Humiliation, Afraid, Rape, and Kick questionnaire    Fear of Current or Ex-Partner: No    Emotionally Abused: No    Physically Abused: No    Sexually Abused: No    FAMILY HISTORY: Family History  Problem Relation Age of Onset   Hypertension Mother    Heart disease Father    Heart attack Father    CAD Father    Colon cancer Neg Hx    Esophageal cancer Neg Hx    Rectal cancer Neg Hx    Stomach cancer Neg Hx     ALLERGIES:  is  allergic to propofol .  MEDICATIONS:  Current Outpatient Medications  Medication Sig Dispense Refill   acyclovir  (ZOVIRAX ) 400 MG tablet Take 1 tablet (400 mg total) by mouth daily. 30 tablet 3   allopurinol  (ZYLOPRIM ) 300 MG tablet Take 1 tablet (300 mg total) by mouth daily. 30 tablet 3   apixaban  (ELIQUIS ) 5 MG TABS tablet Take 2 tablets (10 mg total) by mouth 2 (two) times daily for 3 days, THEN 1 tablet (5 mg total) 2 (two) times daily. 60 tablet 2   cephALEXin  (KEFLEX ) 250 MG capsule Take 2 capsules (500mg  total) by mouth three times a day for 10 days, then one capsule (250mg ) by mouth daily afterwards 80 capsule 0   DULoxetine  (CYMBALTA ) 30 MG capsule Take 1 capsule (30 mg total) by mouth daily. 30 capsule 3   fentaNYL  (DURAGESIC ) 25 MCG/HR Place 1 patch onto the skin every 3 (three) days. 5 patch 0   lidocaine -prilocaine  (EMLA ) cream Apply to affected area once 30 g 3   magic mouthwash (multi-ingredient) oral suspension Swish and swallow 5-10 mLs 4 (four) times daily as needed for mouth pain. 480 mL 1   methocarbamol  (ROBAXIN ) 500 MG tablet Take 500 mg by mouth every 8 (eight) hours as needed for muscle spasms.  0   omeprazole  (PRILOSEC) 20 MG capsule TAKE 1 CAPSULE (20 MG TOTAL) BY MOUTH 2 (TWO) TIMES DAILY BEFORE A MEAL. 180 capsule 0   ondansetron  (ZOFRAN ) 8 MG tablet Take 1 tablet (8 mg total) by mouth every 8 (eight) hours as needed for nausea or vomiting. Start on the third day after cyclophosphamide  chemotherapy. 30 tablet 1   oxybutynin  (DITROPAN -XL) 10 MG 24 hr tablet TAKE 1 TABLET BY MOUTH EVERY DAY 30 tablet 0   Oxycodone  HCl 10 MG TABS Take 1 tablet (10 mg total) by mouth every 4 (four) hours as needed (pain). 60 tablet 0   polyethylene glycol  powder (GLYCOLAX /MIRALAX ) 17 GM/SCOOP powder Take 17 g by mouth daily as needed. Dissolve 1 capful (17g) in 4-8 ounces of liquid and take by mouth daily. 238 g 1   potassium chloride  SA (KLOR-CON  M) 20 MEQ tablet Take 1 tablet (20 mEq  total) by mouth daily. 7 tablet 0   predniSONE  (DELTASONE ) 20 MG tablet Take 5 tablets (100 mg total) by mouth as directed. Take with food on days 2-5 of chemotherapy. 20 tablet 4   pregabalin  (LYRICA ) 75 MG capsule Take 1 capsule (75 mg total) by mouth 2 (two) times daily. 60 capsule 2   prochlorperazine  (COMPAZINE ) 10 MG tablet Take 1 tablet (10 mg total) by mouth every 6 (six) hours as needed for nausea or vomiting. 30 tablet 6   tamsulosin  (FLOMAX ) 0.4 MG CAPS capsule TAKE 1 CAPSULE BY MOUTH EVERY DAY 30 capsule 0   traZODone  (DESYREL ) 50 MG tablet Take 1 tablet (50 mg total) by mouth at bedtime as needed for sleep. 30 tablet 3   No current facility-administered medications for this visit.    Review of Systems  Constitutional:  Positive for fatigue and unexpected weight change. Negative for appetite change, chills and fever.  HENT:   Negative for hearing loss and voice change.   Eyes:  Negative for eye problems.  Respiratory:  Negative for chest tightness and cough.   Cardiovascular:  Negative for chest pain.  Gastrointestinal:  Negative for abdominal distention, abdominal pain and blood in stool.  Endocrine: Negative for hot flashes.  Genitourinary:  Negative for difficulty urinating and frequency.   Musculoskeletal:  Positive for back pain. Negative for arthralgias.  Skin:  Negative for itching and rash.  Neurological:  Positive for numbness. Negative for extremity weakness.       Left buttock pain radiate down to calf and foot.   Hematological:  Negative for adenopathy.  Psychiatric/Behavioral:  Negative for confusion.      PHYSICAL EXAMINATION: ECOG PERFORMANCE STATUS: 2 - Symptomatic, <50% confined to bed  Vitals:   06/07/24 0843  BP: 105/73  Pulse: 94  Resp: 18  SpO2: 95%   Filed Weights   06/07/24 0843  Weight: 167 lb 4.8 oz (75.9 kg)    Physical Exam Constitutional:      General: She is not in acute distress.    Appearance: She is not diaphoretic.  HENT:      Head: Normocephalic and atraumatic.     Mouth/Throat:     Pharynx: No oropharyngeal exudate.  Eyes:     General: No scleral icterus.    Pupils: Pupils are equal, round, and reactive to light.  Cardiovascular:     Rate and Rhythm: Normal rate and regular rhythm.     Heart sounds: No murmur heard. Pulmonary:     Effort: Pulmonary effort is normal. No respiratory distress.     Breath sounds: Normal breath sounds. No wheezing.  Abdominal:     General: There is no distension.     Palpations: Abdomen is soft.     Tenderness: There is no abdominal tenderness.  Musculoskeletal:        General: Normal range of motion.     Cervical back: Normal range of motion and neck supple.  Skin:    General: Skin is warm and dry.     Findings: No erythema.  Neurological:     Mental Status: She is alert and oriented to person, place, and time.     Cranial Nerves: No cranial nerve deficit.  Motor: No abnormal muscle tone.     Coordination: Coordination normal.  Psychiatric:        Mood and Affect: Affect normal.      LABORATORY DATA:  I have reviewed the data as listed    Latest Ref Rng & Units 06/07/2024    8:35 AM 06/06/2024    6:18 AM 06/05/2024    3:34 AM  CBC  WBC 4.0 - 10.5 K/uL 2.3  5.7  28.9   Hemoglobin 12.0 - 15.0 g/dL 89.8  8.9  9.2   Hematocrit 36.0 - 46.0 % 30.1  27.0  27.6   Platelets 150 - 400 K/uL 125  143  189       Latest Ref Rng & Units 06/07/2024    8:35 AM 06/05/2024    3:35 AM 06/04/2024    4:55 AM  CMP  Glucose 70 - 99 mg/dL 883   91   BUN 8 - 23 mg/dL 11   12   Creatinine 9.55 - 1.00 mg/dL 9.63  9.61  9.60   Sodium 135 - 145 mmol/L 133   137   Potassium 3.5 - 5.1 mmol/L 3.6   3.8   Chloride 98 - 111 mmol/L 98   106   CO2 22 - 32 mmol/L 26   26   Calcium 8.9 - 10.3 mg/dL 8.4   8.3   Total Protein 6.5 - 8.1 g/dL 6.2   5.1   Total Bilirubin 0.0 - 1.2 mg/dL 0.7   0.5   Alkaline Phos 38 - 126 U/L 61   65   AST 15 - 41 U/L 17   13   ALT 0 - 44 U/L 16   7       RADIOGRAPHIC STUDIES: I have personally reviewed the radiological images as listed and agreed with the findings in the report. CT ABDOMEN PELVIS W CONTRAST Addendum Date: 06/04/2024 ADDENDUM REPORT: 06/04/2024 12:49 ADDENDUM: Please note there is a left posterior pelvic infiltrative mass abutting the left piriformis muscle and infiltrating into the left greater sciatic fragment. The mass appears subjectively smaller compared to prior CT and measures approximately 6.0 x 2.4 cm (previously 5.8 x 3.3 cm). There is loss of fat plane between the mass and distal left ureter. Electronically Signed   By: Vanetta Chou M.D.   On: 06/04/2024 12:49   Result Date: 06/04/2024 CLINICAL DATA:  Generalized abdominal pain. EXAM: CT ABDOMEN AND PELVIS WITH CONTRAST TECHNIQUE: Multidetector CT imaging of the abdomen and pelvis was performed using the standard protocol following bolus administration of intravenous contrast. RADIATION DOSE REDUCTION: This exam was performed according to the departmental dose-optimization program which includes automated exposure control, adjustment of the mA and/or kV according to patient size and/or use of iterative reconstruction technique. CONTRAST:  OMNIPAQUE  IOHEXOL  300 MG/ML  SOLN COMPARISON:  CT abdomen pelvis dated 05/17/2024 FINDINGS: Lower chest: The visualized lung bases are clear. No intra-abdominal free air or free fluid. Hepatobiliary: No focal liver abnormality is seen. No gallstones, gallbladder wall thickening, or biliary dilatation. Pancreas: Unremarkable. No pancreatic ductal dilatation or surrounding inflammatory changes. Spleen: Normal in size without focal abnormality. Adrenals/Urinary Tract: The adrenal glands unremarkable. Left-sided pigtail ureteral catheter with tip in the left renal inferior pole collecting system and distal end in the urinary bladder. Mild left hydronephrosis similar or minimally decreased. There is enhancement of the left urothelium  suggestive of ascending UTI. The right kidney is unremarkable. The right ureter and urinary bladder appear unremarkable. Stomach/Bowel: Moderate  stool throughout the colon. There is no bowel obstruction or active inflammation. Appendectomy. Vascular/Lymphatic: The abdominal aorta and IVC unremarkable. No portal venous gas. There is no adenopathy. Reproductive: The uterus is grossly unremarkable no suspicious adnexal masses. Other: None Musculoskeletal: Degenerative changes of the spine. No acute osseous pathology. IMPRESSION: 1. Left-sided pigtail ureteral catheter with similar or minimally decreased mild left hydronephrosis. 2. Enhancement of the left urothelium suggestive of ascending UTI. Correlation with urinalysis recommended. 3. No bowel obstruction. Electronically Signed: By: Vanetta Chou M.D. On: 06/03/2024 13:30   DG Chest Port 1 View Result Date: 06/03/2024 CLINICAL DATA:  Questionable sepsis - evaluate for abnormality EXAM: PORTABLE CHEST 1 VIEW COMPARISON:  May 17, 2024 FINDINGS: The cardiomediastinal silhouette is unchanged in contour.RIGHT chest port with tip terminating over the SVC. No pleural effusion. No pneumothorax. No acute pleuroparenchymal abnormality. IMPRESSION: No acute cardiopulmonary abnormality. Electronically Signed   By: Corean Salter M.D.   On: 06/03/2024 11:56   MR Lumbar Spine W Wo Contrast Result Date: 05/31/2024 EXAM: MRI LUMBAR SPINE 05/31/2024 03:19:00 PM TECHNIQUE: Multiplanar multisequence MRI of the lumbar spine was performed with and without the administration of intravenous contrast. 7 mL (gadobutrol  (GADAVIST ) 1 MMOL/ML injection 7 mL GADOBUTROL  1 MMOL/ML IV SOLN). COMPARISON: CT abdomen and pelvis 05/17/2024. CLINICAL HISTORY: Metastatic disease evaluation; low back pain with numbness down left leg. History of lymphoma. FINDINGS: BONES AND ALIGNMENT: 5 lumbar type vertebrae. Mild lumbar dextroscoliosis. Grade 1 retrolisthesis of L1-L2 and L2-L3 and  grade 1 anterolisthesis of L4-L5. No fracture or suspicious marrow lesion. Mild bilateral facet edema at L4-L5. SPINAL CORD: The conus medullaris terminates at L1-L2 and is normal in signal. No evidence of epidural tumor. SOFT TISSUES: No paraspinal mass. INCIDENTAL FINDINGS: Mild left hydronephrosis, similar to the prior CT. DISC LEVELS: Disc desiccation primarily from L1-L2 through L4-L5. Severe disc space narrowing at L1-L2 and mild to moderate narrowing at L2-L3 and L3-L4. T12-L1: A broad central disc protrusion results in mild spinal stenosis. Patent neural foramina. L1-L2: Disc bulging and mild facet hypertrophy without significant stenosis. L2-L3: Circumferential disc bulging mildly eccentric to the left and moderate to severe facet hypertrophy result in mild left lateral recess stenosis and mild bilateral neural foraminal stenosis without spinal stenosis. L3-L4: Disc bulging, prominent epidural fat, and moderate to severe facet hypertrophy result in mild to moderate spinal stenosis, mild right greater than left lateral recess stenosis, and mild bilateral neural foraminal stenosis. L4-L5: Anterolisthesis with mild bulging of uncovered disc and severe facet hypertrophy result in mild bilateral lateral recess stenosis and mild bilateral neural foraminal stenosis without significant spinal stenosis. L5-S1: Severe facet hypertrophy without disc herniation or stenosis. IMPRESSION: 1. No evidence of metastatic disease in the lumbar spine. 2. Diffuse disc and facet degeneration with mild to moderate spinal stenosis at L3-L4. 3. Mild lateral recess and neural foraminal stenosis at L2-3 and L4-5. 4. Mild left hydronephrosis, similar to prior CT. Electronically signed by: Dasie Hamburg MD 05/31/2024 03:31 PM EST RP Workstation: [id number]   US  Venous Img Lower Bilateral (DVT) Result Date: 05/19/2024 CLINICAL DATA:  Lower extremity pain. EXAM: BILATERAL LOWER EXTREMITY VENOUS DOPPLER ULTRASOUND TECHNIQUE: Gray-scale  sonography with graded compression, as well as color Doppler and duplex ultrasound were performed to evaluate the lower extremity deep venous systems from the level of the common femoral vein and including the common femoral, femoral, profunda femoral, popliteal and calf veins including the posterior tibial, peroneal and gastrocnemius veins when visible. The superficial great saphenous vein was  also interrogated. Spectral Doppler was utilized to evaluate flow at rest and with distal augmentation maneuvers in the common femoral, femoral and popliteal veins. COMPARISON:  None Available. FINDINGS: RIGHT LOWER EXTREMITY Common Femoral Vein: No evidence of thrombus. Normal compressibility, respiratory phasicity and response to augmentation. Saphenofemoral Junction: No evidence of thrombus. Normal compressibility and flow on color Doppler imaging. Profunda Femoral Vein: No evidence of thrombus. Normal compressibility and flow on color Doppler imaging. Femoral Vein: No evidence of thrombus. Normal compressibility, respiratory phasicity and response to augmentation. Popliteal Vein: No evidence of thrombus. Normal compressibility, respiratory phasicity and response to augmentation. Calf Veins: No evidence of thrombus. Normal compressibility and flow on color Doppler imaging. Superficial Great Saphenous Vein: No evidence of thrombus. Normal compressibility. Venous Reflux:  None. Other Findings: No evidence of superficial thrombophlebitis or abnormal fluid collection. LEFT LOWER EXTREMITY Common Femoral Vein: No evidence of thrombus. Normal compressibility, respiratory phasicity and response to augmentation. Saphenofemoral Junction: No evidence of thrombus. Normal compressibility and flow on color Doppler imaging. Profunda Femoral Vein: No evidence of thrombus. Normal compressibility and flow on color Doppler imaging. Femoral Vein: No evidence of thrombus. Normal compressibility, respiratory phasicity and response to  augmentation. Popliteal Vein: No evidence of thrombus. Normal compressibility, respiratory phasicity and response to augmentation. Calf Veins: No evidence of thrombus. Normal compressibility and flow on color Doppler imaging. Superficial Great Saphenous Vein: No evidence of thrombus. Normal compressibility. Venous Reflux:  None. Other Findings: No evidence of superficial thrombophlebitis or abnormal fluid collection. IMPRESSION: No evidence of deep venous thrombosis in either lower extremity. Electronically Signed   By: Marcey Moan M.D.   On: 05/19/2024 17:20   CT Angio Chest Pulmonary Embolism (PE) W or WO Contrast Result Date: 05/18/2024 CLINICAL DATA:  Concern for pulmonary embolism.  Lymphoma. EXAM: CT ANGIOGRAPHY CHEST WITH CONTRAST TECHNIQUE: Multidetector CT imaging of the chest was performed using the standard protocol during bolus administration of intravenous contrast. Multiplanar CT image reconstructions and MIPs were obtained to evaluate the vascular anatomy. RADIATION DOSE REDUCTION: This exam was performed according to the departmental dose-optimization program which includes automated exposure control, adjustment of the mA and/or kV according to patient size and/or use of iterative reconstruction technique. CONTRAST:  50mL OMNIPAQUE  IOHEXOL  350 MG/ML SOLN COMPARISON:  Chest radiograph dated 05/17/2024. FINDINGS: Cardiovascular: There is no cardiomegaly or pericardial effusion. The thoracic aorta is unremarkable. The origins of the great vessels of the aortic arch are patent. Right-sided Port-A-Cath with tip in the right atrium at the cavoatrial junction. Linear nonocclusive right lower lobe subsegmental pulmonary artery embolus, age indeterminate. No CT evidence of right heart straining. Mediastinum/Nodes: No hilar or mediastinal adenopathy. The esophagus and the thyroid gland are grossly unremarkable no mediastinal fluid collection. Lungs/Pleura: Minimal bibasilar linear atelectasis. No focal  consolidation, pleural effusion, or pneumothorax. The central airways are patent. Upper Abdomen: No acute abnormality. Musculoskeletal: Degenerative changes of spine. No acute osseous pathology. Bilateral shoulder arthroplasties. Review of the MIP images confirms the above findings. IMPRESSION: 1. Age indeterminate linear nonocclusive right lower lobe subsegmental pulmonary artery embolus. No CT evidence of right heart straining. 2. No focal consolidation. These results will be called to the ordering clinician or representative by the Radiologist Assistant, and communication documented in the PACS or [employer]. Electronically Signed   By: Vanetta Chou M.D.   On: 05/18/2024 14:56   CT ABDOMEN PELVIS W CONTRAST Result Date: 05/17/2024 CLINICAL DATA:  Abdominal pain.  Concern for kidney stone. EXAM: CT ABDOMEN AND PELVIS WITH  CONTRAST TECHNIQUE: Multidetector CT imaging of the abdomen and pelvis was performed using the standard protocol following bolus administration of intravenous contrast. RADIATION DOSE REDUCTION: This exam was performed according to the departmental dose-optimization program which includes automated exposure control, adjustment of the mA and/or kV according to patient size and/or use of iterative reconstruction technique. CONTRAST:  OMNIPAQUE  IOHEXOL  300 MG/ML  SOLN COMPARISON:  CT abdomen pelvis dated 04/08/2024. FINDINGS: Lower chest: The visualized lung bases are clear. Tiny nonocclusive embolus in the right lower lobe subsegmental branch (2/2). Chest CT may provide better evaluation. No intra-abdominal free air or free fluid. Hepatobiliary: The liver is unremarkable. No biliary dilatation. The gallbladder is unremarkable. Pancreas: Unremarkable. No pancreatic ductal dilatation or surrounding inflammatory changes. Spleen: Normal in size without focal abnormality. Adrenals/Urinary Tract: The adrenal glands unremarkable. Left ureteral stent with proximal tip in the inferior  pole collecting system and distal end in the urinary bladder. There is mild left hydronephrosis. There is urothelial enhancement of the left renal collecting system and ureter suggestive of ascending UTI. No stone identified. The right kidney, right ureter, and urinary bladder appear unremarkable. Stomach/Bowel: Moderate stool throughout the colon. There is no bowel obstruction or active inflammation. Appendectomy. Vascular/Lymphatic: The abdominal aorta and IVC unremarkable. No portal venous gas. There is no adenopathy. Reproductive: The uterus is grossly unremarkable. Other: Interval decrease in the masslike soft tissues thickening of the left pelvic sidewall measuring approximately 5.8 x 3.3 cm in greatest axial dimensions (previously 6.7 x 4.8 cm). A 1.4 x 1.2 cm rim enhancing low attenuating focus within this soft tissue mass (coronal 52/5) suspicious for an area of phlegmonous change or developing abscess. Musculoskeletal: Degenerative changes of the spine. No acute osseous pathology. IMPRESSION: 1. Left ureteral stent with mild left hydronephrosis and findings suggestive of ascending UTI. Correlation with urinalysis recommended. 2. Interval decrease in the masslike soft tissues thickening of the left pelvic sidewall. A 1.4 x 1.2 cm rim enhancing low attenuating focus within this soft tissue mass suspicious for an area of phlegmonous change or developing abscess. 3. Tiny nonocclusive embolus in the right lower lobe subsegmental branch. Chest CT may provide better evaluation. These results were called by telephone at the time of interpretation on 05/17/2024 at 6:28 pm to provider LAMAR PRICE , who verbally acknowledged these results. Electronically Signed   By: Vanetta Chou M.D.   On: 05/17/2024 18:39   DG Chest Port 1 View Result Date: 05/17/2024 CLINICAL DATA:  Sepsis. EXAM: PORTABLE CHEST 1 VIEW COMPARISON:  Chest radiograph dated 09/17/2012 FINDINGS: Right-sided PICC with tip over central SVC. No  focal consolidation, pleural effusion, pneumothorax. The cardiac silhouette is within limits. No acute osseous pathology. Bilateral shoulder arthroplasties. IMPRESSION: No active disease. Electronically Signed   By: Vanetta Chou M.D.   On: 05/17/2024 16:38

## 2024-06-07 NOTE — Assessment & Plan Note (Addendum)
 DLBCL of left pelvic wall mass, Atypical t (8; 14), BCL2 BCL6 monosomy.  Negative for BCL2, BCL6 and MYC rearrangement PET scan evaluation showed mild hypermetabolism in spleen.  Nonspecific.  Likely stage I/II disease. Bone marrow biopsy is negative.   IPI score 1-2 Baseline Echo showed LVEF 55-60% Grade I diastolic dysfunction   10/29/205 Cycle 1 R CHOP  - hospitalized due to neutropenic fever, complicated UTI 05/31/2024 cycle 2 R -dose reduced CHP with D3 GCSF.  Hold Vincristine  due to  left foot neuropathy Hospitalized due to complicated UTI I recommend to hold of cycle 3 given her poor tolerability. Repeat PET for evaluation of treatment response.  She will see me in mid December to review PET and decide next steps.   TLS prophylaxis Allopurinol  300mg  daily- finish current supply Acyclovir  400mg  daily for prophylaxis.  PRN IVF hydration sessions.

## 2024-06-07 NOTE — Assessment & Plan Note (Signed)
Eliquis 5 mg BID

## 2024-06-07 NOTE — Assessment & Plan Note (Addendum)
 To finish 10 days of Keflex  500mg  TID followed by 250mg  daily as prophylaxis

## 2024-06-08 ENCOUNTER — Other Ambulatory Visit: Payer: Self-pay | Admitting: Oncology

## 2024-06-08 LAB — CULTURE, BLOOD (ROUTINE X 2)

## 2024-06-09 ENCOUNTER — Other Ambulatory Visit: Payer: Self-pay

## 2024-06-09 LAB — CULTURE, BLOOD (ROUTINE X 2)
Culture: NO GROWTH
Culture: NO GROWTH
Special Requests: ADEQUATE

## 2024-06-10 ENCOUNTER — Other Ambulatory Visit: Payer: Self-pay

## 2024-06-11 ENCOUNTER — Encounter: Payer: Self-pay | Admitting: Internal Medicine

## 2024-06-12 ENCOUNTER — Other Ambulatory Visit: Payer: Self-pay

## 2024-06-13 ENCOUNTER — Other Ambulatory Visit: Payer: Self-pay | Admitting: *Deleted

## 2024-06-13 DIAGNOSIS — R29898 Other symptoms and signs involving the musculoskeletal system: Secondary | ICD-10-CM

## 2024-06-13 DIAGNOSIS — C8330 Diffuse large B-cell lymphoma, unspecified site: Secondary | ICD-10-CM

## 2024-06-14 ENCOUNTER — Ambulatory Visit

## 2024-06-14 ENCOUNTER — Other Ambulatory Visit: Payer: Self-pay

## 2024-06-14 ENCOUNTER — Encounter: Payer: Self-pay | Admitting: Oncology

## 2024-06-14 ENCOUNTER — Other Ambulatory Visit: Payer: Self-pay | Admitting: Hospice and Palliative Medicine

## 2024-06-14 ENCOUNTER — Ambulatory Visit: Admitting: Urology

## 2024-06-14 VITALS — BP 104/73 | HR 85 | Wt 168.0 lb

## 2024-06-14 DIAGNOSIS — N1330 Unspecified hydronephrosis: Secondary | ICD-10-CM

## 2024-06-14 DIAGNOSIS — N390 Urinary tract infection, site not specified: Secondary | ICD-10-CM | POA: Diagnosis not present

## 2024-06-14 MED ORDER — NITROFURANTOIN MONOHYD MACRO 100 MG PO CAPS
100.0000 mg | ORAL_CAPSULE | Freq: Every day | ORAL | 1 refills | Status: DC
  Filled 2024-06-14: qty 30, 30d supply, fill #0
  Filled 2024-07-02 – 2024-07-13 (×2): qty 30, 30d supply, fill #1

## 2024-06-14 MED ORDER — OXYCODONE HCL 10 MG PO TABS
10.0000 mg | ORAL_TABLET | ORAL | 0 refills | Status: DC | PRN
  Filled 2024-06-14: qty 60, 10d supply, fill #0

## 2024-06-14 NOTE — Progress Notes (Signed)
   06/14/2024 12:30 PM   Geneal Vanshika Jastrzebski April 28, 1961 [id number]  Reason for visit: Follow up left hydronephrosis, recurrent UTI  History: My initial visit with her December 2025 Initially presented September 2025 with left-sided flank pain and nausea, AKI with creatinine 1.33 and on CT was found to have a left pelvic mass(ultimately found to be B-cell lymphoma) with hydronephrosis and Dr. Watt placed a left ureteral stent for renal colic and AKI She has had 2 UTIs since that time requiring hospitalization, likely combination of foreign body and chemotherapy Tolerating stent well when she does not have a UTI  Physical Exam: BP 104/73 (BP Location: Right Arm, Patient Position: Sitting, Cuff Size: Normal)   Pulse 85   Wt 168 lb (76.2 kg)   SpO2 97%   BMI 28.84 kg/m   Imaging/labs: I personally viewed and interpreted the multiple recent CT scans, including the most recent from 06/03/2024 showing slight decrease in size of the left pelvic mass that is adjacent to the distal ureter, left ureteral stent in place with no hydronephrosis Creatinine 0.36, eGFR greater than 60  Today: Interested in strategies to decrease UTIs and long-term options for the stent Denies any stent related symptoms today, currently finishing course of Keflex  for recent UTI Denies any flank pain or nausea  Plan:   Left hydronephrosis from malignant obstruction: We discussed the need to continue stent for the time being to optimize kidney function and relieve obstructive symptoms.  She has a repeat PET scan in mid December and we will follow-up those results to consider if stent can potentially be removed.  Will go ahead and schedule her for OR for cystoscopy, left retrograde pyelogram, possible stent removal versus replacement for January 2026.  Discussed other alternatives like nephrostomy tube and she would like to avoid that if at all possible.   Recurrent UTI: Added cranberry tablet twice daily prophylaxis as  well as daily low-dose nitrofurantoin.  Has grown multiple bacteria with different resistance patterns, but I think low-dose nitrofurantoin is a good starting point for a prophylactic antibiotic, could also consider Hip-Rex in the future Schedule OR January 2026 for cystoscopy, stent removal, left retrograde pyelogram and possible stent removal versus replacement Trial of cranberry tablet prophylaxis and low-dose nitrofurantoin for UTI prophylaxis  I spent 45 total minutes on the day of the encounter including pre-visit review of the medical record, face-to-face time with the patient, and post visit ordering of labs/imaging/tests.  Extensive review of prior medical records and imaging, oncology notes, lab work, and discussion with patient and her husband about B-cell lymphoma with upstream left-sided hydronephrosis and obstruction and treatment options/strategies and UTI prevention strategies.   Redell JAYSON Burnet, MD  [facility] [address] [place], [place] [address] [phone]

## 2024-06-14 NOTE — Progress Notes (Signed)
 Surgical Physician Order Form [facility] [place]  Dr. Redell Burnet, MD  * Scheduling expectation : January 2026  *Length of Case: 30 minutes  *Clearance needed: no  *Anticoagulation Instructions: May continue all anticoagulants  *Aspirin  Instructions: Ok to continue all  *Post-op visit Date/Instructions:  tbd  *Diagnosis: Left Hydronephrosis  *Procedure: left  Cysto w/stent exchange, retrograde pyelogram (47667)   Additional orders: N/A  -Admit type: OUTpatient  -Anesthesia: General  -VTE Prophylaxis Standing Order SCD's       Other:   -Standing Lab Orders Per Anesthesia    Lab other: UA&Urine Culture  -Standing Test orders EKG/Chest x-ray per Anesthesia       Test other:   - Medications:  Ancef  2gm IV  -Other orders:  N/A

## 2024-06-14 NOTE — Patient Instructions (Signed)
 Take cranberry tablets twice daily to help prevent infections, in addition to the low-dose nitrofurantoin  Ureteral Stent Implantation Ureteral stent implantation is a procedure to insert (implant) a flexible, soft, plastic tube (stent) into a ureter. Ureters are the tubelike parts of the body that drain urine from the kidneys. A ureteral stent may be implanted: After a procedure to remove a blockage from the ureter (ureterolysis or pyeloplasty). To open the flow of urine when a blockage is caused by a kidney stone, tumor, blood clot, or infection. You have two ureters, one on each side of your body. The ureters connect your kidneys to your bladder. The stent is placed so that one end is in your kidney, and one end is in your bladder. The stent supports the ureter while it heals and helps to drain urine. The stent is usually taken out after your ureter has healed. Depending on your condition, you may have a stent for just a few weeks, or you may have a long-term stent that will need to be replaced every few months. Tell a health care provider about: Any allergies you have. All medicines you are taking, including vitamins, herbs, eye drops, creams, and over-the-counter medicines. Any problems you or family members have had with anesthetic medicines. Any bleeding problems you have. Any surgeries you have had. Any medical conditions you have. Whether you are pregnant or may be pregnant. What are the risks? Generally, this is a safe procedure. However, problems may occur, including: Infection. Bleeding. Allergic reactions to medicines. Damage to nearby structures or organs, such as tearing (perforation) of the ureter. Movement of the stent away from where it is placed during surgery (migration). Buildup of a crust or hard coating (encrustation) on the stent. This happens when bacteria in the body form crystals on the stent, causing it to weaken. What happens before the procedure? Medicines Ask  your health care provider about: Changing or stopping your regular medicines. These include any diabetes medicines or blood thinners you take. Taking medicines such as aspirin  and ibuprofen. These medicines can thin your blood. Do not take them unless your health care provider tells you to. Taking over-the-counter medicines, vitamins, herbs, and supplements. When to stop eating and drinking Follow instructions from your health care provider about what you may eat and drink. These may include: 8 hours before your procedure Stop eating most foods. Do not eat meat, fried foods, or fatty foods. Eat only light foods, such as toast or crackers. All liquids are okay except energy drinks and alcohol. 6 hours before your procedure Stop eating. Drink only clear liquids, such as water , clear fruit juice, black coffee, plain tea, and sports drinks. Do not drink energy drinks or alcohol. 2 hours before your procedure Stop drinking all liquids. You may be allowed to take medicines with small sips of water . If you do not follow your health care provider's instructions, your procedure may be delayed or canceled. General instructions Do not use any products that contain nicotine or tobacco for at least 4 weeks before the procedure. These products include cigarettes, chewing tobacco, and vaping devices, such as e-cigarettes. If you need help quitting, ask your health care provider. You may have an exam or testing, such as imaging or blood tests. If you will be going home right after the procedure, plan to have a responsible adult: Take you home from the hospital or clinic. You will not be allowed to drive. Care for you for the time you are told. Ask your  health care provider what steps will be taken to help prevent infection. These steps may include: Removing hair at the surgery site. Washing skin with a soap that kills germs. Taking antibiotic medicine. What happens during the procedure? An IV will be  inserted into one of your veins. You may be given: A medicine to help you relax (sedative). A medicine to make you fall asleep (general anesthetic). A thin, tube-shaped instrument with a light and tiny camera at the end (cystoscope) will be inserted into your urethra. The urethra is the part of your body that drains urine from the bladder. The urethra opens at the end of the penis or in front of the vaginal opening. The cystoscope will be passed into your bladder. Guided imagery using X-ray may be used to pass a thin wire (guide wire) through your bladder and into your ureter. This wire is used to guide the stent into your ureter. The stent will be inserted into your ureter. The guide wire and the cystoscope will be removed. A thin, flexible tube (catheter) may be put through your urethra so that one end is in your bladder. This helps to drain urine from your bladder. The procedure may vary among hospitals and health care providers. What happens after the procedure? Your blood pressure, heart rate, breathing rate, and blood oxygen level will be monitored until you leave the hospital or clinic. You may continue to get medicine and fluids through an IV. You may have some soreness or pain in your abdomen and urethra. You may be given medicines for this. You will be encouraged to get up and walk around as soon as you can. You may have a catheter draining your urine. Summary Ureteral stent implantation is a procedure to insert a flexible, soft, plastic tube (stent) into a ureter. You may have a stent implanted to support the ureter while it heals after a procedure or to open the flow of urine if there is a blockage. You may have a stent for just a few weeks, or you may have a long-term stent that will need to be replaced every few months. Follow instructions from your health care provider about taking medicines and about eating and drinking before the procedure. This information is not intended to  replace advice given to you by your health care provider. Make sure you discuss any questions you have with your health care provider. Document Revised: 08/04/2021 Document Reviewed: 08/04/2021 Elsevier Patient Education  2024 [employer].

## 2024-06-19 ENCOUNTER — Ambulatory Visit: Attending: Internal Medicine

## 2024-06-19 DIAGNOSIS — R262 Difficulty in walking, not elsewhere classified: Secondary | ICD-10-CM | POA: Insufficient documentation

## 2024-06-19 DIAGNOSIS — R29898 Other symptoms and signs involving the musculoskeletal system: Secondary | ICD-10-CM | POA: Insufficient documentation

## 2024-06-19 DIAGNOSIS — M6281 Muscle weakness (generalized): Secondary | ICD-10-CM | POA: Insufficient documentation

## 2024-06-19 NOTE — Therapy (Signed)
 OUTPATIENT PHYSICAL THERAPY EVALUATION  Patient Name: Stacey [name] MRN: [id number] DOB:1960/09/13, 63 y.o., female Today's Date: 06/20/2024  PCP: Stacey Lot, PA-C  REFERRING PROVIDER: .[name], Stacey K, MD  END OF SESSION:  PT End of Session - 06/19/24 1115     Visit Number 1    Number of Visits 12    Date for Recertification  07/31/24    Authorization Type [employer]: cert for 87/1/74-8/80/73    Progress Note Due on Visit 10    PT Start Time 1100    PT Stop Time 1145    PT Time Calculation (min) 45 min    Activity Tolerance Patient limited by fatigue;Patient limited by pain    Behavior During Therapy [facility] for tasks assessed/performed          Past Medical History:  Diagnosis Date   Arthritis    left shoulder, neck, lower back  (01/11/2018)   Cancer (HCC)    Chronic lower back pain    Family history of adverse reaction to anesthesia    Mother has nausea   GERD (gastroesophageal reflux disease)    PONV (postoperative nausea and vomiting)    Pre-diabetes    UTI (urinary tract infection) 03/20/2021   Past Surgical History:  Procedure Laterality Date   APPENDECTOMY     CYSTOSCOPY W/ URETERAL STENT PLACEMENT Left 04/08/2024   Procedure: CYSTOSCOPY, WITH RETROGRADE PYELOGRAM AND URETERAL STENT INSERTION;  Surgeon: Stacey Rush, MD;  Location: [facility] ORS;  Service: Urology;  Laterality: Left;   IR IMAGING GUIDED PORT INSERTION  05/01/2024   JOINT REPLACEMENT     TOTAL SHOULDER ARTHROPLASTY Left 01/11/2018   TOTAL SHOULDER ARTHROPLASTY Left 01/11/2018   Procedure: LEFT TOTAL SHOULDER ARTHROPLASTY;  Surgeon: Stacey Selinda Dover, MD;  Location: [facility] OR;  Service: Orthopedics;  Laterality: Left;  2.5 hrs   TOTAL SHOULDER ARTHROPLASTY Right 02/08/2020   Procedure: TOTAL SHOULDER ARTHROPLASTY;  Surgeon: Stacey Selinda Dover, MD;  Location: [facility] OR;  Service: Orthopedics;  Laterality: Right;  2.5 hrs RNFA   Patient Active Problem List   Diagnosis Date Noted    Swelling of left lower extremity 06/05/2024   Acute cystitis with hematuria 06/05/2024   Leukocytosis 06/04/2024   History of pulmonary embolism 06/04/2024   Weakness of left lower extremity 06/04/2024   Neuropathy 05/31/2024   Thrombocytopenia 05/22/2024   Severe sepsis (HCC) 05/18/2024   Pulmonary embolism (HCC) 05/18/2024   Urinary tract infection without hematuria 05/18/2024   Neutropenia 05/18/2024   Neutropenic fever 05/17/2024   Neoplasm related pain 05/10/2024   Encounter for antineoplastic chemotherapy 05/10/2024   Diffuse large B-cell lymphoma (HCC) 04/26/2024   Pre-diabetes    Hydronephrosis of left kidney 04/08/2024   AKI (acute kidney injury) 04/08/2024   Hx of total shoulder replacement, right 02/08/2020   Osteoarthritis of left shoulder 01/11/2018   S/P shoulder replacement, left 01/11/2018    ONSET DATE: Pt reports September 2025   REFERRING DIAG: R29.898 (ICD-10-CM) - Left leg weakness   THERAPY DIAG:  Muscle weakness (generalized)  Difficulty in walking, not elsewhere classified  Rationale for Evaluation and Treatment: Rehabilitation  SUBJECTIVE:  SUBJECTIVE STATEMENT: Pt reports she has been having a [name] of pain in LLE, poor weightbearing tolerance.    PERTINENT HISTORY:   Stacey [name] is a 63yoF who is referred 06/02/24 to OPPT for 'LLE weakness'. Pt admitted to [facility] on 11/22-11/25 after undergoing second cycle of chemo 11/19. Pt received GCSF and IVF with concern for dehydration, admitted with sepsis secondary to urinary tract infection. Obtaining a clear history in detail from patient is difficult. Pt reports LLE pain that started in September, no clear etiology per her report, pain is along the back of the calf and foot, also has numbness along distribution of left  lateral calf, and pt reports dramatic fluctuations in Left lower leg edema, particularly when AMB. Pt relates occasional pain distribution to left lower back at times. Pt reports her pain is always 9/10 at best, but attempting walking in community can make pain go to 15-16/10. Pt denies any fluctuation in symptoms in relation to time of day or bedtime activity. Pt reports sleeping preferentially in prone, but denies any affect on her leg symptoms. Pt has been elevating Left leg on recliner + pillow when swollen. While admitted, compression hose recommended to pt but reportedly not compatible with limb allodynia. When pain is elevated, pt will place ice along the calf for relief. It later comes up that pt was followed by pain management at [facility] for spine injections for several years (unable to specify), but that these treatments were ended upon starting chemotherapy. Pt denies any similar symptoms related to her history of spine injections. Pt denies any acute imbalance or recent falls, uses a RW in home most of time, takes [facility] when leaving the house. Recent workup revealing of recent Lumbar spine imaging and LE US  which was negative for DVT.   PAIN:  Are you having pain? 9/10 pain in LLE on average, worst pain in last week days 15-16/10;   PRECAUTIONS:   None WEIGHT BEARING RESTRICTIONS:  None  FALLS: Has patient fallen in last 6 months?   LIVING ENVIRONMENT: Lives with: Husband  Lives in: mobile home  Stairs: 4 c 2 railing  Has following equipment at home: SPC, RW   PATIENT GOALS: Improved tolerance to LLE weightbearing  OBJECTIVE:  Note: Objective measures were completed at Evaluation unless otherwise noted.  DIAGNOSTIC FINDINGS:  EXAM: MRI LUMBAR SPINE 05/31/2024 03:19:00 PM   TECHNIQUE: Multiplanar multisequence MRI of the lumbar spine was performed with and without the administration of intravenous contrast. 7 mL (gadobutrol  (GADAVIST ) 1 MMOL/ML injection 7 mL GADOBUTROL  1  MMOL/ML IV SOLN).   COMPARISON: CT abdomen and pelvis 05/17/2024.   CLINICAL HISTORY: Metastatic disease evaluation; low back pain with numbness down left leg. History of lymphoma.   FINDINGS:   BONES AND ALIGNMENT: 5 lumbar type vertebrae. Mild lumbar dextroscoliosis. Grade 1 retrolisthesis of L1-L2 and L2-L3 and grade 1 anterolisthesis of L4-L5. No fracture or suspicious marrow lesion. Mild bilateral facet edema at L4-L5.   SPINAL CORD: The conus medullaris terminates at L1-L2 and is normal in signal. No evidence of epidural tumor.   SOFT TISSUES: No paraspinal mass.   INCIDENTAL FINDINGS: Mild left hydronephrosis, similar to the prior CT.   DISC LEVELS: Disc desiccation primarily from L1-L2 through L4-L5. Severe disc space narrowing at L1-L2 and mild to moderate narrowing at L2-L3 and L3-L4.   T12-L1: A broad central disc protrusion results in mild spinal stenosis. Patent neural foramina.   L1-L2: Disc bulging and mild facet hypertrophy without significant stenosis.  L2-L3: Circumferential disc bulging mildly eccentric to the left and moderate to severe facet hypertrophy result in mild left lateral recess stenosis and mild bilateral neural foraminal stenosis without spinal stenosis.   L3-L4: Disc bulging, prominent epidural fat, and moderate to severe facet hypertrophy result in mild to moderate spinal stenosis, mild right greater than left lateral recess stenosis, and mild bilateral neural foraminal stenosis.   L4-L5: Anterolisthesis with mild bulging of uncovered disc and severe facet hypertrophy result in mild bilateral lateral recess stenosis and mild bilateral neural foraminal stenosis without significant spinal stenosis.   L5-S1: Severe facet hypertrophy without disc herniation or stenosis.   IMPRESSION: 1. No evidence of metastatic disease in the lumbar spine. 2. Diffuse disc and facet degeneration with mild to moderate spinal stenosis  at L3-L4. 3. Mild lateral recess and neural foraminal stenosis at L2-3 and L4-5. 4. Mild left hydronephrosis, similar to prior CT.   Electronically signed by: Dasie Hamburg MD 05/31/2024 03:31 PM EST RP Workstation: [id number]  ----------------------------  05/19/24 US  venous imaging BLE IMPRESSION: No evidence of deep venous thrombosis in either lower extremity.    Electronically Signed   By: Marcey Moan M.D.   On: 05/19/2024 17:20  ------------------------------  CTA 05/18/24 IMPRESSION: 1. Age indeterminate linear nonocclusive right lower lobe subsegmental pulmonary artery embolus. No CT evidence of right heart straining. 2. No focal consolidation.  COGNITION: Overall cognitive status: Pt is tangential and lacking in detail when interviewed regarding history of CC.    SENSATION: Left lower leg numbness along fibularis area.   EDEMA:  Tibial tuberostiy Rt: 43.75cm, Left: 42cm  Distal 5cm: Rt: 44cm  Left: 44cm  Superior malleolar circumference: Rt ankle: 23.5cm; Left: 24.5cm  Pedal MLA: Rt arch: 24cm; Left: 24.8cm  LOWER EXTREMITY ROM:    -Pt shows Left ankle DF A/ROM, limited due to painful neural tension intolerance, appears at foot drop in gait cycle, however felt to be antalgic v true neurological weakness.   BED MOBILITY:  ModI sitting to/from supine  TRANSFERS: modI, segmented, slow, heavily dependent on UE  GAIT: 120ft overground AMB c SPC: antalgic, painful, LLE in slight sustained plantarflexion;   FUNCTIONAL TESTS:  -Slump test: (+) neural tension with knee and/or ankle; no  symptoms provocation with changes to slump, cervical extension/flexion.  -PSLR: (+) neural tension at 35 degrees hip flexion, no symptoms change with cervical flexion, extension.   PATIENT SURVEYS:  None                                                                                                                             TREATMENT DATE 06/20/24   -Pt's symptoms consistent  with Left sided nerve root irritation, inconsistent testing and imaging, symptoms presenting as non-mechanical in nature per pt report, feel poor prognosis for response to PT interventions.  -LLE fluctuating edema concerning that additional workup is needed, cannot r/o vascular etiology for symptoms above despite negative LE DVT study, and recent (+)  PE.   PATIENT EDUCATION: Education details: See above Person educated: Amiria  Education method: Discussion  Education comprehension: Fair  HOME EXERCISE PROGRAM: None  GOALS: Goals reviewed with patient?   SHORT TERM GOALS: Target date:  *no goals at this time, evaluation only; unclear that pt would benefit from PT intervention at this time.   LONG TERM GOALS: Target date:  *no goals at this time, evaluation only; unclear that pt would benefit from PT intervention at this time.   ASSESSMENT:  CLINICAL IMPRESSION: 63yoF referred for weakness of LLE poorly defined diagnostically. Exam here revealing of mild LLE edema, symptoms reported concurrent with Left L4/L5 radiculopathy, however no mechanical features noted as are typically with presentation. Pt is limited by high levels of constant pain in limb and significant intolerance to neural tensioning. As pt is historically followed by pain management with [facility], they would be well poised to assess patient for this new problem, however it is not clear whether pt was referred back to them for this, per pt reports this did not take place. Pt's report of fluctuating LLE edema also concerning for occult vascular problem that warrants additional diagnostics given history of recent PE and current cancer diagnosis. At this point, do not feel pt would benefit from PT services, however will take time to review outside notes in detail to formulate the best clinical judgment on this matter and will contact pt about FU as needed should new insights arise.   OBJECTIVE IMPAIRMENTS: Decreased knowledge of  condition, decreased use of DME, decreased mobility, difficulty walking, decreased strength, decreased ROM. ACTIVITY LIMITATIONS: Lifting, standing, walking, squatting, transfers, locomotion level PARTICIPATION LIMITATIONS: Cleaning, laundry, interpersonal relationships, driving, yardwork, community activity.  PERSONAL FACTORS: Age, behavior pattern, education, past/current experiences, transportation, profession  are also affecting patient's functional outcome.  REHAB POTENTIAL: Good CLINICAL DECISION MAKING: Medium  EVALUATION COMPLEXITY: HIGH   PLAN:  PT FREQUENCY: 1-2x/week   PT DURATION: 4 weeks  PLANNED INTERVENTIONS: 97110-Therapeutic exercises, 97530- Therapeutic activity, [id number]- Neuromuscular re-education, 97535- Self Care, 02859- Manual therapy, and Patient/Family education  PLAN FOR NEXT SESSION: No FU needed at this time.    Chevella Pearce C, PT 06/20/2024, 8:33 AM   10:25 AM, 06/20/24 Peggye JAYSON Linear, PT, DPT Physical Therapist - [facility]  Outpatient Physical Therapy- Main Campus [phone]

## 2024-06-21 ENCOUNTER — Inpatient Hospital Stay

## 2024-06-21 ENCOUNTER — Telehealth: Payer: Self-pay

## 2024-06-21 ENCOUNTER — Other Ambulatory Visit: Payer: Self-pay

## 2024-06-21 ENCOUNTER — Inpatient Hospital Stay: Admitting: Oncology

## 2024-06-21 ENCOUNTER — Inpatient Hospital Stay: Admitting: Hospice and Palliative Medicine

## 2024-06-21 ENCOUNTER — Inpatient Hospital Stay: Payer: Self-pay

## 2024-06-21 NOTE — Telephone Encounter (Signed)
 Per Dr. Francisca, Patient is to be scheduled for Left Cystoscopy with Stent Exchange and Left Retrograde Pyelogram   Stacey [name] was contacted and possible surgical dates were discussed, Monday January 12th, 2026 was agreed upon for surgery.   Patient was instructed that Dr. Francisca will require them to provide a pre-op UA & CX prior to surgery. This was ordered and scheduled drop off appointment was made for 07/12/2024.    Patient was directed to call [phone] between 1-3pm the day before surgery to find out surgical arrival time.  Instructions were given not to eat or drink from midnight on the night before surgery and have a driver for the day of surgery. On the surgery day patient was instructed to enter through the [facility] entrance of [facility] report the Same Day Surgery desk.   Pre-Admit Testing will be in contact via phone to set up an interview with the anesthesia team to review your history and medications prior to surgery.   Reminder of this information was sent via MyChart to the patient.

## 2024-06-21 NOTE — Progress Notes (Signed)
° °  [facility] Surgical Posting Form  Surgery Date: Date: 07/24/2024  Surgeon: Dr. Redell Burnet, MD  Inpt ( No  )   Outpt (Yes)   Obs ( No  )   Diagnosis: N13.30 Left Hydronephrosis  -CPT: 47667, [phone]  Surgery: Left Cystoscopy with Stent Exchange and Left Retrograde Pyelogram  Stop Anticoagulations: No, May continue all  Cardiac/Medical/Pulmonary Clearance needed: no  *Orders entered into EPIC  Date: 06/21/24   *Case booked in [place]  Date: 06/21/24  *Notified pt of Surgery: Date: 06/21/24  PRE-OP UA & CX: yes, will obtain in clinic on 07/12/2024  *Placed into Prior Authorization Work [place] Date: 06/21/24  Assistant/laser/rep:No

## 2024-06-23 ENCOUNTER — Inpatient Hospital Stay

## 2024-06-23 ENCOUNTER — Encounter: Payer: Self-pay | Admitting: Oncology

## 2024-06-24 ENCOUNTER — Encounter: Payer: Self-pay | Admitting: Oncology

## 2024-06-24 ENCOUNTER — Other Ambulatory Visit: Payer: Self-pay

## 2024-06-24 ENCOUNTER — Inpatient Hospital Stay
Admission: EM | Admit: 2024-06-24 | Discharge: 2024-06-28 | DRG: 871 | Disposition: A | Discharge status: Home / Self Care | Attending: Internal Medicine | Admitting: Internal Medicine

## 2024-06-24 ENCOUNTER — Emergency Department

## 2024-06-24 DIAGNOSIS — Z7901 Long term (current) use of anticoagulants: Secondary | ICD-10-CM

## 2024-06-24 DIAGNOSIS — R1900 Intra-abdominal and pelvic swelling, mass and lump, unspecified site: Secondary | ICD-10-CM | POA: Diagnosis present

## 2024-06-24 DIAGNOSIS — Z96612 Presence of left artificial shoulder joint: Secondary | ICD-10-CM | POA: Diagnosis present

## 2024-06-24 DIAGNOSIS — Z8249 Family history of ischemic heart disease and other diseases of the circulatory system: Secondary | ICD-10-CM

## 2024-06-24 DIAGNOSIS — Z96611 Presence of right artificial shoulder joint: Secondary | ICD-10-CM | POA: Diagnosis present

## 2024-06-24 DIAGNOSIS — D721 Eosinophilia, unspecified: Secondary | ICD-10-CM | POA: Diagnosis present

## 2024-06-24 DIAGNOSIS — J189 Pneumonia, unspecified organism: Principal | ICD-10-CM | POA: Diagnosis present

## 2024-06-24 DIAGNOSIS — C8330 Diffuse large B-cell lymphoma, unspecified site: Secondary | ICD-10-CM | POA: Diagnosis present

## 2024-06-24 DIAGNOSIS — Z79899 Other long term (current) drug therapy: Secondary | ICD-10-CM

## 2024-06-24 DIAGNOSIS — D649 Anemia, unspecified: Secondary | ICD-10-CM | POA: Diagnosis present

## 2024-06-24 DIAGNOSIS — Z86711 Personal history of pulmonary embolism: Secondary | ICD-10-CM | POA: Diagnosis not present

## 2024-06-24 DIAGNOSIS — Z79891 Long term (current) use of opiate analgesic: Secondary | ICD-10-CM | POA: Diagnosis not present

## 2024-06-24 DIAGNOSIS — K219 Gastro-esophageal reflux disease without esophagitis: Secondary | ICD-10-CM | POA: Diagnosis present

## 2024-06-24 DIAGNOSIS — Z960 Presence of urogenital implants: Secondary | ICD-10-CM

## 2024-06-24 DIAGNOSIS — A419 Sepsis, unspecified organism: Principal | ICD-10-CM | POA: Diagnosis present

## 2024-06-24 DIAGNOSIS — R7303 Prediabetes: Secondary | ICD-10-CM | POA: Diagnosis present

## 2024-06-24 DIAGNOSIS — Z1152 Encounter for screening for COVID-19: Secondary | ICD-10-CM | POA: Diagnosis not present

## 2024-06-24 HISTORY — DX: Polyneuropathy, unspecified: G62.9

## 2024-06-24 LAB — COMPREHENSIVE METABOLIC PANEL WITH GFR
ALT: 7 U/L (ref 0–44)
AST: 15 U/L (ref 15–41)
Albumin: 3.5 g/dL (ref 3.5–5.0)
Alkaline Phosphatase: 62 U/L (ref 38–126)
Anion gap: 11 (ref 5–15)
BUN: 7 mg/dL — ABNORMAL LOW (ref 8–23)
CO2: 23 mmol/L (ref 22–32)
Calcium: 9 mg/dL (ref 8.9–10.3)
Chloride: 100 mmol/L (ref 98–111)
Creatinine, Ser: 0.48 mg/dL (ref 0.44–1.00)
GFR, Estimated: >60 mL/min (ref >60)
Glucose, Bld: 147 mg/dL — ABNORMAL HIGH (ref 70–99)
Potassium: 3.6 mmol/L (ref 3.5–5.1)
Sodium: 134 mmol/L — ABNORMAL LOW (ref 135–145)
Total Bilirubin: 0.4 mg/dL (ref 0.0–1.2)
Total Protein: 6.2 g/dL — ABNORMAL LOW (ref 6.5–8.1)

## 2024-06-24 LAB — RESP PANEL BY RT-PCR (RSV, FLU A&B, COVID)  RVPGX2
Influenza A by PCR: NEGATIVE
Influenza B by PCR: NEGATIVE
Resp Syncytial Virus by PCR: NEGATIVE
SARS Coronavirus 2 by RT PCR: NEGATIVE

## 2024-06-24 LAB — CBC WITH DIFFERENTIAL/PLATELET
Abs Immature Granulocytes: 0.07 K/uL (ref 0.00–0.07)
Basophils Absolute: 0 K/uL (ref 0.0–0.1)
Basophils Relative: 0 %
Eosinophils Absolute: 0.6 K/uL — ABNORMAL HIGH (ref 0.0–0.5)
Eosinophils Relative: 5 %
HCT: 30.8 % — ABNORMAL LOW (ref 36.0–46.0)
Hemoglobin: 10.2 g/dL — ABNORMAL LOW (ref 12.0–15.0)
Immature Granulocytes: 1 %
Lymphocytes Relative: 5 %
Lymphs Abs: 0.6 K/uL — ABNORMAL LOW (ref 0.7–4.0)
MCH: 32.3 pg (ref 26.0–34.0)
MCHC: 33.1 g/dL (ref 30.0–36.0)
MCV: 97.5 fL (ref 80.0–100.0)
Monocytes Absolute: 1.2 K/uL — ABNORMAL HIGH (ref 0.1–1.0)
Monocytes Relative: 10 %
Neutro Abs: 9.5 K/uL — ABNORMAL HIGH (ref 1.7–7.7)
Neutrophils Relative %: 79 %
Platelets: 178 K/uL (ref 150–400)
RBC: 3.16 MIL/uL — ABNORMAL LOW (ref 3.87–5.11)
RDW: 17.2 % — ABNORMAL HIGH (ref 11.5–15.5)
WBC: 12 K/uL — ABNORMAL HIGH (ref 4.0–10.5)
nRBC: 0 % (ref 0.0–0.2)

## 2024-06-24 LAB — URINALYSIS, W/ REFLEX TO CULTURE (INFECTION SUSPECTED)
Bilirubin Urine: NEGATIVE
Glucose, UA: NEGATIVE mg/dL
Ketones, ur: NEGATIVE mg/dL
Leukocytes,Ua: NEGATIVE
Nitrite: NEGATIVE
Protein, ur: NEGATIVE mg/dL
Specific Gravity, Urine: 1.002 — ABNORMAL LOW (ref 1.005–1.030)
pH: 7 (ref 5.0–8.0)

## 2024-06-24 LAB — LACTIC ACID, PLASMA
Lactic Acid, Venous: 1.6 mmol/L (ref 0.5–1.9)
Lactic Acid, Venous: 1.4 mmol/L (ref 0.5–1.9)

## 2024-06-24 LAB — PROCALCITONIN: Procalcitonin: 0.32 ng/mL

## 2024-06-24 LAB — PROTIME-INR
INR: 1.2 (ref 0.8–1.2)
Prothrombin Time: 15.9 s — ABNORMAL HIGH (ref 11.4–15.2)

## 2024-06-24 MED ORDER — SODIUM CHLORIDE 0.9 % IV SOLN: 500.0000 mg | Freq: Once | INTRAVENOUS | Status: DC

## 2024-06-24 MED ORDER — POLYETHYLENE GLYCOL 3350 17 GM/SCOOP PO POWD: 17.0000 g | Freq: Every day | ORAL | Status: DC | PRN

## 2024-06-24 MED ORDER — SODIUM CHLORIDE 0.9 % IV SOLN
1.0000 g | Freq: Once | INTRAVENOUS | Status: AC
  Administered 2024-06-24: 1 g via INTRAVENOUS
  Filled 2024-06-24: qty 10

## 2024-06-24 MED ORDER — SODIUM CHLORIDE 0.9 % IV SOLN
2.0000 g | Freq: Three times a day (TID) | INTRAVENOUS | Status: DC
  Administered 2024-06-24 – 2024-06-25 (×3): 2 g via INTRAVENOUS
  Filled 2024-06-24 (×5): qty 12.5

## 2024-06-24 MED ORDER — PREDNISONE 20 MG PO TABS: 100.0000 mg | ORAL_TABLET | ORAL | Status: DC

## 2024-06-24 MED ORDER — PANTOPRAZOLE SODIUM 40 MG PO TBEC
40.0000 mg | DELAYED_RELEASE_TABLET | Freq: Every day | ORAL | Status: DC
  Administered 2024-06-24 – 2024-06-28 (×5): 40 mg via ORAL
  Filled 2024-06-24 (×5): qty 1

## 2024-06-24 MED ORDER — ACETAMINOPHEN 650 MG RE SUPP: 650.0000 mg | Freq: Four times a day (QID) | RECTAL | Status: DC | PRN

## 2024-06-24 MED ORDER — ONDANSETRON HCL 4 MG PO TABS: 4.0000 mg | ORAL_TABLET | Freq: Four times a day (QID) | ORAL | Status: DC | PRN

## 2024-06-24 MED ORDER — METHOCARBAMOL 500 MG PO TABS
500.0000 mg | ORAL_TABLET | Freq: Three times a day (TID) | ORAL | Status: DC | PRN
  Administered 2024-06-27: 21:00:00 500 mg via ORAL
  Filled 2024-06-24: qty 1

## 2024-06-24 MED ORDER — SODIUM CHLORIDE 0.9 % IV SOLN
500.0000 mg | INTRAVENOUS | Status: DC
  Administered 2024-06-24 – 2024-06-25 (×2): 500 mg via INTRAVENOUS
  Filled 2024-06-24 (×2): qty 5

## 2024-06-24 MED ORDER — ENSURE PLUS HIGH PROTEIN PO LIQD
237.0000 mL | Freq: Two times a day (BID) | ORAL | Status: DC
  Administered 2024-06-25 – 2024-06-26 (×4): 237 mL via ORAL

## 2024-06-24 MED ORDER — OXYCODONE HCL 5 MG PO TABS
10.0000 mg | ORAL_TABLET | ORAL | Status: DC | PRN
  Administered 2024-06-24 – 2024-06-25 (×6): 10 mg via ORAL
  Filled 2024-06-24 (×6): qty 2

## 2024-06-24 MED ORDER — APIXABAN 5 MG PO TABS
5.0000 mg | ORAL_TABLET | Freq: Two times a day (BID) | ORAL | Status: DC
  Administered 2024-06-24 – 2024-06-28 (×9): 5 mg via ORAL
  Filled 2024-06-24 (×9): qty 1

## 2024-06-24 MED ORDER — ACETAMINOPHEN 325 MG PO TABS: 650.0000 mg | ORAL_TABLET | Freq: Four times a day (QID) | ORAL | Status: DC | PRN

## 2024-06-24 MED ORDER — IPRATROPIUM-ALBUTEROL 0.5-2.5 (3) MG/3ML IN SOLN: 3.0000 mL | RESPIRATORY_TRACT | Status: DC | PRN

## 2024-06-24 MED ORDER — ONDANSETRON HCL 4 MG/2ML IJ SOLN
4.0000 mg | Freq: Four times a day (QID) | INTRAMUSCULAR | Status: DC | PRN
  Administered 2024-06-24 – 2024-06-27 (×2): 4 mg via INTRAVENOUS
  Filled 2024-06-24 (×2): qty 2

## 2024-06-24 MED ORDER — TRAZODONE HCL 50 MG PO TABS
50.0000 mg | ORAL_TABLET | Freq: Every evening | ORAL | Status: DC | PRN
  Administered 2024-06-24 – 2024-06-27 (×3): 50 mg via ORAL
  Filled 2024-06-24 (×3): qty 1

## 2024-06-24 MED ORDER — PREGABALIN 75 MG PO CAPS
75.0000 mg | ORAL_CAPSULE | Freq: Two times a day (BID) | ORAL | Status: DC
  Administered 2024-06-24 – 2024-06-28 (×9): 75 mg via ORAL
  Filled 2024-06-24 (×9): qty 1

## 2024-06-24 MED ORDER — BISACODYL 10 MG RE SUPP: 10.0000 mg | Freq: Every day | RECTAL | Status: DC | PRN

## 2024-06-24 MED ORDER — ALLOPURINOL 300 MG PO TABS
300.0000 mg | ORAL_TABLET | Freq: Every day | ORAL | Status: DC
  Administered 2024-06-24 – 2024-06-28 (×5): 300 mg via ORAL
  Filled 2024-06-24 (×5): qty 1

## 2024-06-24 MED ORDER — OXYBUTYNIN CHLORIDE ER 5 MG PO TB24
10.0000 mg | ORAL_TABLET | Freq: Every day | ORAL | Status: DC
  Administered 2024-06-24 – 2024-06-28 (×5): 10 mg via ORAL
  Filled 2024-06-24 (×3): qty 2
  Filled 2024-06-24: qty 1
  Filled 2024-06-24: qty 2

## 2024-06-24 MED ORDER — TAMSULOSIN HCL 0.4 MG PO CAPS
0.4000 mg | ORAL_CAPSULE | Freq: Every day | ORAL | Status: DC
  Administered 2024-06-24 – 2024-06-28 (×5): 0.4 mg via ORAL
  Filled 2024-06-24 (×5): qty 1

## 2024-06-24 MED ORDER — SODIUM CHLORIDE 0.9 % IV SOLN: Freq: Once | INTRAVENOUS | Status: AC

## 2024-06-24 MED ORDER — DULOXETINE HCL 30 MG PO CPEP
30.0000 mg | ORAL_CAPSULE | Freq: Every day | ORAL | Status: DC
  Administered 2024-06-24 – 2024-06-28 (×5): 30 mg via ORAL
  Filled 2024-06-24 (×5): qty 1

## 2024-06-24 NOTE — H&P (Addendum)
 History and Physical    Stacey [name] FMW:[id number] DOB: 1961/06/23 DOA: 06/24/2024  PCP: [name], PA-C (Confirm with patient/family/NH records and if not entered, this has to be entered at [facility] point of entry) Patient coming from: Home  I have personally briefly reviewed patient's old medical records in [facility]  Chief Complaint: SOB, and fever  HPI: Stacey [name] is a 63 y.o. female with medical history significant of diffuse large B cell lymphoma with obstructing mass along left pelvic wall causing hydroureter and hydronephrosis status post stenting in September 2025, PE on Eliquis , presented with worsening of shortness of breath and fever.  Symptoms started about 4 days ago, patient started have brief frontal headache followed by chest congestion, shortness of breath at rest and spiking fever with Tmax 101-104, always in the evenings, with daytime temperature lower 100s, measured by husband at home, for which patient has been taking Tylenol  2-3 times a day.  She had no runny nose or sore throat.  Denied any cough, no chest pain.  Denied any abdominal pain no urinary symptoms no diarrhea.  She further denied any cough or choking after eat or drink.  ED Course: Temperature 100.4 pulse rate 76 blood pressure 130/80 O2 saturation 97% on room air.  Chest x-ray showed bilateral lower fields interstitial opacity consider viral atypical infection.  Blood work showed WBC 12.0 compared to baseline 2.0-3.0 with left shift and eosinophilia, BUN 7 creatinine 0.4 glucose 147 bicarb 23K 3.6.  Patient was given ceftriaxone  and azithromycin  in the ED.  Review of Systems: As per HPI otherwise 14 point review of systems negative.    Past Medical History:  Diagnosis Date   Arthritis    left shoulder, neck, lower back  (01/11/2018)   Cancer (HCC)    lymphoma, on chemo   Chronic lower back pain    Family history of adverse reaction to anesthesia    Mother has nausea   GERD  (gastroesophageal reflux disease)    Neuropathy    L leg   PONV (postoperative nausea and vomiting)    Pre-diabetes    UTI (urinary tract infection) 03/20/2021    Past Surgical History:  Procedure Laterality Date   APPENDECTOMY     CYSTOSCOPY W/ URETERAL STENT PLACEMENT Left 04/08/2024   Procedure: CYSTOSCOPY, WITH RETROGRADE PYELOGRAM AND URETERAL STENT INSERTION;  Surgeon: Watt Rush, MD;  Location: [facility] ORS;  Service: Urology;  Laterality: Left;   IR IMAGING GUIDED PORT INSERTION  05/01/2024   JOINT REPLACEMENT     TOTAL SHOULDER ARTHROPLASTY Left 01/11/2018   TOTAL SHOULDER ARTHROPLASTY Left 01/11/2018   Procedure: LEFT TOTAL SHOULDER ARTHROPLASTY;  Surgeon: Sharl Selinda Dover, MD;  Location: [facility] OR;  Service: Orthopedics;  Laterality: Left;  2.5 hrs   TOTAL SHOULDER ARTHROPLASTY Right 02/08/2020   Procedure: TOTAL SHOULDER ARTHROPLASTY;  Surgeon: Sharl Selinda Dover, MD;  Location: [facility] OR;  Service: Orthopedics;  Laterality: Right;  2.5 hrs RNFA     reports that she has never smoked. She has never used smokeless tobacco. She reports that she does not currently use alcohol. She reports that she does not use drugs.  Allergies[1]  Family History  Problem Relation Age of Onset   Hypertension Mother    Heart disease Father    Heart attack Father    CAD Father    Colon cancer Neg Hx    Esophageal cancer Neg Hx    Rectal cancer Neg Hx    Stomach cancer Neg Hx  Prior to Admission medications  Medication Sig Start Date End Date Taking? Authorizing Provider  acyclovir  (ZOVIRAX ) 400 MG tablet Take 1 tablet (400 mg total) by mouth daily. 05/01/24   Babara Call, MD  allopurinol  (ZYLOPRIM ) 300 MG tablet Take 1 tablet (300 mg total) by mouth daily. 05/01/24   Babara Call, MD  apixaban  (ELIQUIS ) 5 MG TABS tablet Take 2 tablets (10 mg total) by mouth 2 (two) times daily for 3 days, THEN 1 tablet (5 mg total) 2 (two) times daily. 05/24/24 08/25/24  Franchot Novel, MD  cephALEXin   (KEFLEX ) 250 MG capsule Take 2 capsules (500mg  total) by mouth three times a day for 10 days, then one capsule (250mg ) by mouth daily afterwards 06/06/24   Josette Ade, MD  DULoxetine  (CYMBALTA ) 30 MG capsule Take 1 capsule (30 mg total) by mouth daily. 05/31/24   Borders, Fonda SAUNDERS, NP  fentaNYL  (DURAGESIC ) 25 MCG/HR Place 1 patch onto the skin every 3 (three) days. 05/12/24   Babara Call, MD  lidocaine -prilocaine  (EMLA ) cream Apply to affected area once 05/01/24   Babara Call, MD  magic mouthwash (multi-ingredient) oral suspension Swish and swallow 5-10 mLs 4 (four) times daily as needed for mouth pain. 05/16/24   Babara Call, MD  methocarbamol  (ROBAXIN ) 500 MG tablet Take 500 mg by mouth every 8 (eight) hours as needed for muscle spasms. 10/15/17   [provider]  nitrofurantoin , macrocrystal-monohydrate, (MACROBID ) 100 MG capsule Take 1 capsule (100 mg total) by mouth daily. 06/14/24   Francisca Redell BROCKS, MD  omeprazole  (PRILOSEC) 20 MG capsule TAKE 1 CAPSULE (20 MG TOTAL) BY MOUTH 2 (TWO) TIMES DAILY BEFORE A MEAL. 01/11/24   Legrand Victory LITTIE DOUGLAS, MD  ondansetron  (ZOFRAN ) 8 MG tablet Take 1 tablet (8 mg total) by mouth every 8 (eight) hours as needed for nausea or vomiting. Start on the third day after cyclophosphamide  chemotherapy. 05/01/24   Babara Call, MD  oxybutynin  (DITROPAN -XL) 10 MG 24 hr tablet TAKE 1 TABLET BY MOUTH EVERY DAY 05/29/24   Francisca Redell BROCKS, MD  Oxycodone  HCl 10 MG TABS Take 1 tablet (10 mg total) by mouth every 4 (four) hours as needed (pain). 06/14/24   Borders, Fonda SAUNDERS, NP  polyethylene glycol powder (GLYCOLAX /MIRALAX ) 17 GM/SCOOP powder Take 17 g by mouth daily as needed. Dissolve 1 capful (17g) in 4-8 ounces of liquid and take by mouth daily. 06/06/24   Josette Ade, MD  potassium chloride  SA (KLOR-CON  M) 20 MEQ tablet Take 1 tablet (20 mEq total) by mouth daily. 05/31/24   Babara Call, MD  predniSONE  (DELTASONE ) 20 MG tablet Take 5 tablets (100 mg total) by mouth as directed.  Take with food on days 2-5 of chemotherapy. 05/31/24   Babara Call, MD  pregabalin  (LYRICA ) 75 MG capsule Take 1 capsule (75 mg total) by mouth 2 (two) times daily. 06/02/24   Vaslow, Zachary K, MD  prochlorperazine  (COMPAZINE ) 10 MG tablet Take 1 tablet (10 mg total) by mouth every 6 (six) hours as needed for nausea or vomiting. 05/01/24   Babara Call, MD  tamsulosin  (FLOMAX ) 0.4 MG CAPS capsule TAKE 1 CAPSULE BY MOUTH EVERY DAY 05/29/24   Francisca Redell BROCKS, MD  traZODone  (DESYREL ) 50 MG tablet Take 1 tablet (50 mg total) by mouth at bedtime as needed for sleep. 05/31/24   Babara Call, MD    Physical Exam: Vitals:   06/24/24 0729 06/24/24 0731  BP:  130/81  Pulse:  76  Resp:  20  Temp:  ROLLEN)  100.4 F (38 C)  TempSrc:  Oral  SpO2:  97%  Weight: 76.2 kg   Height: 5' 4 (1.626 m)     Constitutional: NAD, calm, comfortable Vitals:   06/24/24 0729 06/24/24 0731  BP:  130/81  Pulse:  76  Resp:  20  Temp:  (!) 100.4 F (38 C)  TempSrc:  Oral  SpO2:  97%  Weight: 76.2 kg   Height: 5' 4 (1.626 m)    Eyes: PERRL, lids and conjunctivae normal ENMT: Mucous membranes are moist. Posterior pharynx clear of any exudate or lesions.Normal dentition.  Neck: normal, supple, no masses, no thyromegaly Respiratory: clear to auscultation bilaterally, no wheezing, bilateral lower field crackles, increasing respiratory effort. No accessory muscle use.  Cardiovascular: Regular rate and rhythm, no murmurs / rubs / gallops. No extremity edema. 2+ pedal pulses. No carotid bruits.  Abdomen: no tenderness, no masses palpated. No hepatosplenomegaly. Bowel sounds positive.  Musculoskeletal: no clubbing / cyanosis. No joint deformity upper and lower extremities. Good ROM, no contractures. Normal muscle tone.  Skin: no rashes, lesions, ulcers. No induration Neurologic: CN 2-12 grossly intact. Sensation intact, DTR normal. Strength 5/5 in all 4.  Psychiatric: Normal judgment and insight. Alert and oriented x 3. Normal  mood.     Labs on Admission: I have personally reviewed following labs and imaging studies  CBC: Recent Labs  Lab 06/24/24 0751  WBC 12.0*  NEUTROABS 9.5*  HGB 10.2*  HCT 30.8*  MCV 97.5  PLT 178   Basic Metabolic Panel: Recent Labs  Lab 06/24/24 0751  NA 134*  K 3.6  CL 100  CO2 23  GLUCOSE 147*  BUN 7*  CREATININE 0.48  CALCIUM 9.0   GFR: Estimated Creatinine Clearance: 73.8 mL/min (by C-G formula based on SCr of 0.48 mg/dL). Liver Function Tests: Recent Labs  Lab 06/24/24 0751  AST 15  ALT 7  ALKPHOS 62  BILITOT 0.4  PROT 6.2*  ALBUMIN 3.5   No results for input(s): LIPASE, AMYLASE in the last 168 hours. No results for input(s): AMMONIA in the last 168 hours. Coagulation Profile: Recent Labs  Lab 06/24/24 0751  INR 1.2   Cardiac Enzymes: No results for input(s): CKTOTAL, CKMB, CKMBINDEX, TROPONINI in the last 168 hours. BNP (last 3 results) No results for input(s): PROBNP in the last 8760 hours. HbA1C: No results for input(s): HGBA1C in the last 72 hours. CBG: No results for input(s): GLUCAP in the last 168 hours. Lipid Profile: No results for input(s): CHOL, HDL, LDLCALC, TRIG, CHOLHDL, LDLDIRECT in the last 72 hours. Thyroid Function Tests: No results for input(s): TSH, T4TOTAL, FREET4, T3FREE, THYROIDAB in the last 72 hours. Anemia Panel: No results for input(s): VITAMINB12, FOLATE, FERRITIN, TIBC, IRON, RETICCTPCT in the last 72 hours. Urine analysis:    Component Value Date/Time   COLORURINE YELLOW (A) 06/03/2024 1058   APPEARANCEUR CLOUDY (A) 06/03/2024 1058   APPEARANCEUR Clear 09/18/2012 1203   LABSPEC 1.013 06/03/2024 1058   LABSPEC 1.013 09/18/2012 1203   PHURINE 6.0 06/03/2024 1058   GLUCOSEU NEGATIVE 06/03/2024 1058   GLUCOSEU Negative 09/18/2012 1203   HGBUR LARGE (A) 06/03/2024 1058   BILIRUBINUR NEGATIVE 06/03/2024 1058   BILIRUBINUR Negative 09/18/2012 1203    KETONESUR NEGATIVE 06/03/2024 1058   PROTEINUR 30 (A) 06/03/2024 1058   NITRITE POSITIVE (A) 06/03/2024 1058   LEUKOCYTESUR LARGE (A) 06/03/2024 1058   LEUKOCYTESUR 1+ 09/18/2012 1203    Radiological Exams on Admission: DG Chest Port 1 View Result Date: 06/24/2024  EXAM: 1 VIEW(S) XRAY OF THE CHEST 06/24/2024 07:53:41 AM COMPARISON: 06/03/2024 CLINICAL HISTORY: 63 year old female undergoing lymphoma treatment with fever, chest congestion, and questionable sepsis. FINDINGS: LINES, TUBES AND DEVICES: Right chest wall Port-A-Cath in place with tip overlying the superior caval junction. LUNGS AND PLEURA: New bilateral pulmonary interstitial opacities, worst at the right lung base. No consolidation or pleural effusion. No pneumothorax. HEART AND MEDIASTINUM: No acute abnormality of the cardiac and mediastinal silhouettes. BONES AND SOFT TISSUES: Bilateral shoulder arthroplasties noted. No acute osseous abnormality. Negative visible bowel gas. IMPRESSION: 1. New bilateral interstitial pulmonary opacities, greatest at the right lung base. Consider viral / atypical respiratory infection. Electronically signed by: Helayne Hurst MD 06/24/2024 07:58 AM EST RP Workstation: [id number]    EKG: Independently reviewed.  Sinus rhythm, no acute ST changes.  Assessment/Plan Principal Problem:   Pneumonia Active Problems:   Diffuse large B-cell lymphoma (HCC)   CAP (community acquired pneumonia)  (please populate well all problems here in Problem List. (For example, if patient is on BP meds at home and you resume or decide to hold them, it is a problem that needs to be her. Same for CAD, COPD, HLD and so on)  Sepsis without acute endorgan damage - Sepsis as evidenced by fever, leukocytosis, source infection is bilateral pneumonia, consider atypical pneumonia versus viral pneumonia.  COVID test negative. - Check respiratory viral panel, check atypical pneumonia panel including Legionella and mycoplasma.  Clinically  has no suspicion for aspiration etiology, hold off speech evaluation - Given that patient also has immunocompromise from lymphoma and ongoing chemo, will also cover her with cefepime  for possible pseudomonal infection. - Continue azithromycin  - Breathing treatment as needed - Incentive spirometry - Other DDx, UA is pending to rule out UTI.  Patient does not have any urinary symptoms at this point.  Hold off prophylactic Macrobid  as patient receiving broad-spectrum antibiotics for pneumonia.  Eosinophilia - Unclear etiology, suspect atypical pneumonia versus viral pneumonia.  Diffuse large B cell - Outpatient follow-up with oncology, last chemotherapy was on November 19 - Continue maintenance prednisone  therapy  History of recent left ureteral obstruction - Denied any urinary symptoms, no back pain, UA pending to rule out UTI - Outpatient follow-up with urology  History of PE - Continue Eliquis   DVT prophylaxis: Eliquis  Code Status: Full code Family Communication: Husband at bedside Disposition Plan: Patient is sick with sepsis secondary to pneumonia complicated with baseline lymphoma and immunocompromise, requiring IV antibiotics and expect more than 2 midnight hospital stay. Consults called: None Admission status: Telemetry admission   Cort ONEIDA Mana MD [facility] Pager [phone]  06/24/2024, 9:53 AM        [1]  Allergies Allergen Reactions   Propofol  Nausea Only    Patient states medication can be tolerated with an antiemetic before taking

## 2024-06-24 NOTE — ED Provider Notes (Signed)
 [facility] Provider Note    Event Date/Time   First MD Initiated Contact with Patient 06/24/24 0725     (approximate)   History   Fever, Nasal Congestion, and Weakness   HPI  Stacey [name] is a 63 y.o. female with a history of diffuse large B-cell lymphoma with known pelvic mass causing hydroureter, hydronephrosis on the left last chemotherapy on November 19, who presents with fever, diffuse weakness, nasal congestion, mild chest congestion.  No shortness of breath reported.  Review of records demonstrates the patient was admitted on November 22 of this year and treated in the hospital for urinary tract infection/sepsis.  She denies any dysuria at this time     Physical Exam   Triage Vital Signs: ED Triage Vitals  Encounter Vitals Group     BP 06/24/24 0731 130/81     Girls Systolic BP Percentile --      Girls Diastolic BP Percentile --      Boys Systolic BP Percentile --      Boys Diastolic BP Percentile --      Pulse Rate 06/24/24 0731 76     Resp 06/24/24 0731 20     Temp 06/24/24 0731 (!) 100.4 F (38 C)     Temp Source 06/24/24 0731 Oral     SpO2 06/24/24 0731 97 %     Weight 06/24/24 0729 76.2 kg (167 lb 15.9 oz)     Height 06/24/24 0729 1.626 m (5' 4)     Head Circumference --      Peak Flow --      Pain Score 06/24/24 0728 9     Pain Loc --      Pain Education --      Exclude from Growth Chart --     Most recent vital signs: Vitals:   06/24/24 0731  BP: 130/81  Pulse: 76  Resp: 20  Temp: (!) 100.4 F (38 C)  SpO2: 97%     General: Awake, no distress.  CV:  Good peripheral perfusion.  Regular rate and rhythm Resp:  Normal effort.  Clear to auscultation bilaterally Abd:  No distention.   Other:     ED Results / Procedures / Treatments   Labs (all labs ordered are listed, but only abnormal results are displayed) Labs Reviewed  COMPREHENSIVE METABOLIC PANEL WITH GFR - Abnormal; Notable for the following  components:      Result Value   Sodium 134 (*)    Glucose, Bld 147 (*)    BUN 7 (*)    Total Protein 6.2 (*)    All other components within normal limits  CBC WITH DIFFERENTIAL/PLATELET - Abnormal; Notable for the following components:   WBC 12.0 (*)    RBC 3.16 (*)    Hemoglobin 10.2 (*)    HCT 30.8 (*)    RDW 17.2 (*)    Neutro Abs 9.5 (*)    Lymphs Abs 0.6 (*)    Monocytes Absolute 1.2 (*)    Eosinophils Absolute 0.6 (*)    All other components within normal limits  PROTIME-INR - Abnormal; Notable for the following components:   Prothrombin Time 15.9 (*)    All other components within normal limits  RESP PANEL BY RT-PCR (RSV, FLU A&B, COVID)  RVPGX2  CULTURE, BLOOD (ROUTINE X 2)  CULTURE, BLOOD (ROUTINE X 2)  LACTIC ACID, PLASMA  LACTIC ACID, PLASMA  URINALYSIS, W/ REFLEX TO CULTURE (INFECTION SUSPECTED)  EKG     RADIOLOGY Chest x-ray concerning for viral pneumonia    PROCEDURES:  Critical Care performed:   Procedures   MEDICATIONS ORDERED IN ED: Medications  cefTRIAXone  (ROCEPHIN ) 1 g in sodium chloride  0.9 % 100 mL IVPB (has no administration in time range)  azithromycin  (ZITHROMAX ) 500 mg in sodium chloride  0.9 % 250 mL IVPB (has no administration in time range)  0.9 %  sodium chloride  infusion ( Intravenous New Bag/Given 06/24/24 0758)     IMPRESSION / MDM / ASSESSMENT AND PLAN / ED COURSE  I reviewed the triage vital signs and the nursing notes. Patient's presentation is most consistent with acute presentation with potential threat to life or bodily function.  Patient with a history of lymphoma presents with fever, diffuse weakness, nasal congestion as detailed above.  Differential includes viral illness such as influenza, COVID, certainly the possibility of pneumonia.  Given her history, will obtain labs including blood cultures, lactic acid.  COVID, influenza PCR  Chest x-ray concerning for viral pneumonia, highly suspicious of COVID, pending  PCR  ----------------------------------------- 9:26 AM on 06/24/2024 ----------------------------------------- PCR negative for COVID and influenza, given x-ray suspicious for pneumonia, mildly elevated white blood cell count, fever, will treat with IV antibiotics, culture sent.  Lactic is reassuring, no evidence of sepsis at this time.  However the patient is diffusely weak, given her multiple comorbidities including her B-cell lymphoma will consult the hospitalist for admission      FINAL CLINICAL IMPRESSION(S) / ED DIAGNOSES   Final diagnoses:  Pneumonia of right lung due to infectious organism, unspecified part of lung     Rx / DC Orders   ED Discharge Orders     None        Note:  This document was prepared using Dragon voice recognition software and may include unintentional dictation errors.   Arlander Charleston, MD 06/24/24 [phone]

## 2024-06-24 NOTE — ED Triage Notes (Signed)
 Pt to ED for fever and chest congestion since 3-4 days. Gets chemo for lymphoma. Last treatment was around thanksgiving. States temp as 102.4 this AM around 6am and took tylenol  PTA. Denies cough. Pt states she feels weak. Pt appears pale.

## 2024-06-25 ENCOUNTER — Inpatient Hospital Stay

## 2024-06-25 DIAGNOSIS — J189 Pneumonia, unspecified organism: Secondary | ICD-10-CM | POA: Diagnosis not present

## 2024-06-25 LAB — RESPIRATORY PANEL BY PCR

## 2024-06-25 LAB — MRSA NEXT GEN BY PCR, NASAL: MRSA by PCR Next Gen: NOT DETECTED

## 2024-06-25 LAB — CBC
HCT: 28.2 % — ABNORMAL LOW (ref 36.0–46.0)
Hemoglobin: 9.1 g/dL — ABNORMAL LOW (ref 12.0–15.0)
MCH: 31.7 pg (ref 26.0–34.0)
MCHC: 32.3 g/dL (ref 30.0–36.0)
MCV: 98.3 fL (ref 80.0–100.0)
Platelets: 173 K/uL (ref 150–400)
RBC: 2.87 MIL/uL — ABNORMAL LOW (ref 3.87–5.11)
RDW: 17.6 % — ABNORMAL HIGH (ref 11.5–15.5)
WBC: 7.7 K/uL (ref 4.0–10.5)
nRBC: 0 % (ref 0.0–0.2)

## 2024-06-25 LAB — BASIC METABOLIC PANEL WITH GFR
Anion gap: 10 (ref 5–15)
BUN: 8 mg/dL (ref 8–23)
CO2: 26 mmol/L (ref 22–32)
Calcium: 8.3 mg/dL — ABNORMAL LOW (ref 8.9–10.3)
Chloride: 104 mmol/L (ref 98–111)
Creatinine, Ser: 0.51 mg/dL (ref 0.44–1.00)
GFR, Estimated: >60 mL/min (ref >60)
Glucose, Bld: 105 mg/dL — ABNORMAL HIGH (ref 70–99)
Potassium: 3.7 mmol/L (ref 3.5–5.1)
Sodium: 140 mmol/L (ref 135–145)

## 2024-06-25 LAB — STREP PNEUMONIAE URINARY ANTIGEN: Strep Pneumo Urinary Antigen: NEGATIVE

## 2024-06-25 MED ORDER — FENTANYL 25 MCG/HR TD PT72
1.0000 | MEDICATED_PATCH | TRANSDERMAL | Status: DC
  Administered 2024-06-25: 1 via TRANSDERMAL
  Filled 2024-06-25 (×2): qty 1

## 2024-06-25 MED ORDER — AZITHROMYCIN 500 MG PO TABS
500.0000 mg | ORAL_TABLET | Freq: Every day | ORAL | Status: AC
  Administered 2024-06-26: 10:00:00 500 mg via ORAL
  Filled 2024-06-25: qty 1

## 2024-06-25 MED ORDER — SODIUM CHLORIDE 0.9 % IV SOLN
1.0000 g | INTRAVENOUS | Status: DC
  Administered 2024-06-25 – 2024-06-28 (×4): 1 g via INTRAVENOUS
  Filled 2024-06-25 (×4): qty 10

## 2024-06-25 MED ORDER — OXYCODONE HCL 5 MG PO TABS
5.0000 mg | ORAL_TABLET | ORAL | Status: DC | PRN
  Administered 2024-06-25 – 2024-06-26 (×4): 5 mg via ORAL
  Filled 2024-06-25 (×4): qty 1

## 2024-06-25 NOTE — Progress Notes (Signed)
 Progress Note   Patient: Stacey [name] FMW:[id number] DOB: August 09, 1960 DOA: 06/24/2024     1 DOS: the patient was seen and examined on 06/25/2024   Brief hospital course:  Per H&P HPI  HPI: Annaleise Burger is a 63 y.o. female with medical history significant of diffuse large B cell lymphoma with obstructing mass along left pelvic wall causing hydroureter and hydronephrosis status post stenting in September 2025, PE on Eliquis , presented with worsening of shortness of breath and fever.   Symptoms started about 4 days ago, patient started have brief frontal headache followed by chest congestion, shortness of breath at rest and spiking fever with Tmax 101-104, always in the evenings, with daytime temperature lower 100s, measured by husband at home, for which patient has been taking Tylenol  2-3 times a day.  She had no runny nose or sore throat.  Denied any cough, no chest pain.  Denied any abdominal pain no urinary symptoms no diarrhea.  She further denied any cough or choking after eat or drink.   ED Course: Temperature 100.4 pulse rate 76 blood pressure 130/80 O2 saturation 97% on room air.  Chest x-ray showed bilateral lower fields interstitial opacity consider viral atypical infection.  Blood work showed WBC 12.0 compared to baseline 2.0-3.0 with left shift and eosinophilia, BUN 7 creatinine 0.4 glucose 147 bicarb 23K 3.6.   Patient was given ceftriaxone  and azithromycin  in the ED.  Assessment and Plan: Sepsis due to community acquired pneumonia  POA, resolved.  Elevated temp to 100.2. Patient never had cough, but did note symptoms of sinus infection.  Procalc 0.32.  Leukocytosis is resolved.   -Continue ceftriaxone  and azithromycin    Diffuse large B Cell Last chemo 11/19 On prednisone  when she receives chemotherapy She is taking opioids chronically, will discuss with pharmacy when to resume her fentanyl  patch  H/o L ureteral obstruction No urinary symptoms or back pain.  UA  not concerning for infection with less than 5 WBCs noted Outpt f/u w/ urology  Will need to continue Keflex  250 mg daily for prophylaxis of  Normocytic anemia Continue to monitor Patient follows closely with hematology oncology  H/o PE  Continue eliquis       Subjective: no issues overnight.   Physical Exam: Vitals:   06/24/24 2340 06/25/24 0348 06/25/24 0449 06/25/24 0812  BP:  109/61  112/61  Pulse:  83  79  Resp: 16 16 16 16   Temp:  99.2 F (37.3 C)  100.2 F (37.9 C)  TempSrc:      SpO2:  92%  95%  Weight:      Height:       Physical Exam  Constitutional: In no distress.  Cardiovascular: Normal rate, regular rhythm. No lower extremity edema  Pulmonary: Non labored breathing on room air, rales at bases R>L no wheezing  Abdominal: Soft. Non distended and non tender Musculoskeletal: Normal range of motion.     Neurological: Alert and oriented to person, place, and time. Non focal  Skin: Skin is warm and dry.   Data Reviewed:     Latest Ref Rng & Units 06/25/2024    6:01 AM 06/24/2024    7:51 AM 06/07/2024    8:35 AM  BMP  Glucose 70 - 99 mg/dL 894  852  883   BUN 8 - 23 mg/dL 8  7  11    Creatinine 0.44 - 1.00 mg/dL 9.48  9.51  9.63   Sodium 135 - 145 mmol/L 140  134  133  Potassium 3.5 - 5.1 mmol/L 3.7  3.6  3.6   Chloride 98 - 111 mmol/L 104  100  98   CO2 22 - 32 mmol/L 26  23  26    Calcium 8.9 - 10.3 mg/dL 8.3  9.0  8.4   '    Latest Ref Rng & Units 06/25/2024    6:01 AM 06/24/2024    7:51 AM 06/07/2024    8:35 AM  CBC  WBC 4.0 - 10.5 K/uL 7.7  12.0  2.3   Hemoglobin 12.0 - 15.0 g/dL 9.1  89.7  89.8   Hematocrit 36.0 - 46.0 % 28.2  30.8  30.1   Platelets 150 - 400 K/uL 173  178  125      Family Communication: Husband at bedside   Disposition: Status is: Inpatient Remains inpatient appropriate because: IV antibiotics   Planned Discharge Destination: Home    Time spent: 35 minutes  Author: Alban Pepper, MD 06/25/2024 3:14  PM  For on call review [email or web address].

## 2024-06-25 NOTE — Plan of Care (Signed)

## 2024-06-25 NOTE — Hospital Course (Addendum)
 Never had a cough, but shorntess of breath. Temperature kept getting higher prompting coming to hospital.   Pain with deep breathing. NO burining with peeing.    LLE painful for several months, but has gotten progressively worse.   Will get venous duplex.   May require LLE MRI with contrast.    Eosinophils  Low dose nitrofurantoin , otherwise all medicines are the same.

## 2024-06-26 ENCOUNTER — Other Ambulatory Visit: Payer: Self-pay | Admitting: Oncology

## 2024-06-26 ENCOUNTER — Inpatient Hospital Stay

## 2024-06-26 ENCOUNTER — Ambulatory Visit: Admission: RE | Admit: 2024-06-26 | Source: Ambulatory Visit

## 2024-06-26 DIAGNOSIS — J189 Pneumonia, unspecified organism: Secondary | ICD-10-CM | POA: Diagnosis not present

## 2024-06-26 LAB — BASIC METABOLIC PANEL WITH GFR
Anion gap: 7 (ref 5–15)
BUN: 8 mg/dL (ref 8–23)
CO2: 30 mmol/L (ref 22–32)
Calcium: 9.1 mg/dL (ref 8.9–10.3)
Chloride: 106 mmol/L (ref 98–111)
Creatinine, Ser: 0.57 mg/dL (ref 0.44–1.00)
GFR, Estimated: >60 mL/min (ref >60)
Glucose, Bld: 114 mg/dL — ABNORMAL HIGH (ref 70–99)
Potassium: 4.2 mmol/L (ref 3.5–5.1)
Sodium: 143 mmol/L (ref 135–145)

## 2024-06-26 LAB — CBC WITH DIFFERENTIAL/PLATELET
Abs Immature Granulocytes: 0.03 K/uL (ref 0.00–0.07)
Basophils Absolute: 0 K/uL (ref 0.0–0.1)
Basophils Relative: 0 %
Eosinophils Absolute: 1.1 K/uL — ABNORMAL HIGH (ref 0.0–0.5)
Eosinophils Relative: 13 %
HCT: 29.9 % — ABNORMAL LOW (ref 36.0–46.0)
Hemoglobin: 9.7 g/dL — ABNORMAL LOW (ref 12.0–15.0)
Immature Granulocytes: 0 %
Lymphocytes Relative: 21 %
Lymphs Abs: 1.7 K/uL (ref 0.7–4.0)
MCH: 32 pg (ref 26.0–34.0)
MCHC: 32.4 g/dL (ref 30.0–36.0)
MCV: 98.7 fL (ref 80.0–100.0)
Monocytes Absolute: 1.1 K/uL — ABNORMAL HIGH (ref 0.1–1.0)
Monocytes Relative: 13 %
Neutro Abs: 4.4 K/uL (ref 1.7–7.7)
Neutrophils Relative %: 53 %
Platelets: 198 K/uL (ref 150–400)
RBC: 3.03 MIL/uL — ABNORMAL LOW (ref 3.87–5.11)
RDW: 17.4 % — ABNORMAL HIGH (ref 11.5–15.5)
WBC: 8.3 K/uL (ref 4.0–10.5)
nRBC: 0 % (ref 0.0–0.2)

## 2024-06-26 LAB — LEGIONELLA PNEUMOPHILA SEROGP 1 UR AG: L. pneumophila Serogp 1 Ur Ag: NEGATIVE

## 2024-06-26 MED ORDER — OXYCODONE HCL 5 MG PO TABS
5.0000 mg | ORAL_TABLET | ORAL | Status: DC | PRN
  Administered 2024-06-27 – 2024-06-28 (×4): 10 mg via ORAL
  Filled 2024-06-26 (×4): qty 2

## 2024-06-26 MED ORDER — POLYETHYLENE GLYCOL 3350 17 G PO PACK
17.0000 g | PACK | Freq: Every day | ORAL | Status: DC | PRN
  Administered 2024-06-26 – 2024-06-27 (×2): 17 g via ORAL
  Filled 2024-06-26 (×2): qty 1

## 2024-06-26 MED ORDER — HYDROMORPHONE HCL 1 MG/ML IJ SOLN
0.5000 mg | INTRAMUSCULAR | Status: DC | PRN
  Administered 2024-06-26 – 2024-06-28 (×9): 0.5 mg via INTRAVENOUS
  Filled 2024-06-26 (×9): qty 0.5

## 2024-06-26 MED ORDER — GADOBUTROL 1 MMOL/ML IV SOLN
7.5000 mL | Freq: Once | INTRAVENOUS | Status: AC | PRN
  Administered 2024-06-26: 22:00:00 7.5 mL via INTRAVENOUS

## 2024-06-26 MED ORDER — NALOXONE HCL 0.4 MG/ML IJ SOLN: 0.4000 mg | INTRAMUSCULAR | Status: DC | PRN

## 2024-06-26 NOTE — Plan of Care (Signed)

## 2024-06-26 NOTE — Progress Notes (Signed)
 Progress Note   Patient: Stacey [name] [id number] DOB: April 18, 1961 DOA: 06/24/2024     2 DOS: the patient was seen and examined on 06/26/2024   Brief hospital course: Per H&P HPI  HPI: Stacey [name] is a 63 y.o. female with medical history significant of diffuse large B cell lymphoma with obstructing mass along left pelvic wall causing hydroureter and hydronephrosis status post stenting in September 2025, PE on Eliquis , presented with worsening of shortness of breath and fever.   Symptoms started about 4 days ago, patient started have brief frontal headache followed by chest congestion, shortness of breath at rest and spiking fever with Tmax 101-104, always in the evenings, with daytime temperature lower 100s, measured by husband at home, for which patient has been taking Tylenol  2-3 times a day.  She had no runny nose or sore throat.  Denied any cough, no chest pain.  Denied any abdominal pain no urinary symptoms no diarrhea.  She further denied any cough or choking after eat or drink.   ED Course: Temperature 100.4 pulse rate 76 blood pressure 130/80 O2 saturation 97% on room air.  Chest x-ray showed bilateral lower fields interstitial opacity consider viral atypical infection.  Blood work showed WBC 12.0 compared to baseline 2.0-3.0 with left shift and eosinophilia, BUN 7 creatinine 0.4 glucose 147 bicarb 23K 3.6.   Patient was given ceftriaxone  and azithromycin  in the ED.  Assessment and Plan: Sepsis community acquired pneumonia  Sepsis present on arrival, resolved WBC count 12,000, fever 100.4 F.   Patient had elevated temp to 100.3 Fahrenheit overnight.  Patient never had cough, but did note symptoms of sinus infection.  Procalc 0.32.  Chest x-ray concerning for left lower lobe pneumonia.   -Continue ceftriaxone  and azithromycin     LLE pain Patient has had left lower extremity pain since starting chemotherapy she notes this is worsening recently.  On exam left lower  extremity is this warm to touch compared to the right lower extremity but she does not have any erythema.  Venous ultrasound for DVT.    - Will obtain MRI left lower extremity with contrast  Eosinophilia Worsened on labs this AM. 1100/uL  -Will repeat CBC with differential in the AM.  -Will consider ID consult for guidance of potential infectious cause if patient continues to have fever and eosinophil count worsens.   Diffuse large B Cell Last chemo 11/19 On prednisone  when she receives chemotherapy She is taking opioids chronically, will discuss with pharmacy when to resume her fentanyl  patch  H/o L ureteral obstruction No urinary symptoms or back pain.  UA not concerning for infection with less than 5 WBCs noted Outpt f/u w/ urology  Currently on Keflex  for prophylaxis, will resume this after treating for pneumonia  Normocytic anemia Continue to monitor Patient follows closely with hematology oncology  H/o PE  Continue eliquis       Subjective: no issues overnight.   Physical Exam: Vitals:   06/26/24 0449 06/26/24 0506 06/26/24 0732 06/26/24 1446  BP:  102/69 104/61 (!) 104/55  Pulse:  93 82 (!) 56  Resp: 16 17 18 17   Temp:  99.8 F (37.7 C) 99.4 F (37.4 C) 99.1 F (37.3 C)  TempSrc:    Oral  SpO2:  96% 94% 95%  Weight:      Height:        Constitutional: In no distress.  Cardiovascular: Normal rate, regular rhythm. No lower extremity edema  Pulmonary: Non labored breathing on room air, Rales  at bases no wheezing  Abdominal: Soft. Non distended and non tender Musculoskeletal: Normal range of motion.     Neurological: Alert and oriented to person, place, and time. Non focal  Skin: Skin is warm and dry.    Data Reviewed:     Latest Ref Rng & Units 06/26/2024    5:19 AM 06/25/2024    6:01 AM 06/24/2024    7:51 AM  BMP  Glucose 70 - 99 mg/dL 885  894  852   BUN 8 - 23 mg/dL 8  8  7    Creatinine 0.44 - 1.00 mg/dL 9.42  9.48  9.51   Sodium 135 - 145  mmol/L 143  140  134   Potassium 3.5 - 5.1 mmol/L 4.2  3.7  3.6   Chloride 98 - 111 mmol/L 106  104  100   CO2 22 - 32 mmol/L 30  26  23    Calcium 8.9 - 10.3 mg/dL 9.1  8.3  9.0   '    Latest Ref Rng & Units 06/26/2024    5:19 AM 06/25/2024    6:01 AM 06/24/2024    7:51 AM  CBC  WBC 4.0 - 10.5 K/uL 8.3  7.7  12.0   Hemoglobin 12.0 - 15.0 g/dL 9.7  9.1  89.7   Hematocrit 36.0 - 46.0 % 29.9  28.2  30.8   Platelets 150 - 400 K/uL 198  173  178      Family Communication: Husband at bedside   Disposition: Status is: Inpatient Remains inpatient appropriate because: IV antibiotics   Planned Discharge Destination: Home    Time spent: 35 minutes  Author: Alban Pepper, MD 06/26/2024 5:44 PM  For on call review [email or web address].

## 2024-06-26 NOTE — Progress Notes (Signed)
Pt otf for US

## 2024-06-27 ENCOUNTER — Telehealth: Payer: Self-pay | Admitting: Oncology

## 2024-06-27 DIAGNOSIS — J189 Pneumonia, unspecified organism: Secondary | ICD-10-CM | POA: Diagnosis not present

## 2024-06-27 LAB — CBC WITH DIFFERENTIAL/PLATELET
Abs Immature Granulocytes: 0.05 K/uL (ref 0.00–0.07)
Basophils Absolute: 0 K/uL (ref 0.0–0.1)
Basophils Relative: 1 %
Eosinophils Absolute: 1.3 K/uL — ABNORMAL HIGH (ref 0.0–0.5)
Eosinophils Relative: 18 %
HCT: 28.6 % — ABNORMAL LOW (ref 36.0–46.0)
Hemoglobin: 9.1 g/dL — ABNORMAL LOW (ref 12.0–15.0)
Immature Granulocytes: 1 %
Lymphocytes Relative: 21 %
Lymphs Abs: 1.5 K/uL (ref 0.7–4.0)
MCH: 31.5 pg (ref 26.0–34.0)
MCHC: 31.8 g/dL (ref 30.0–36.0)
MCV: 99 fL (ref 80.0–100.0)
Monocytes Absolute: 1 K/uL (ref 0.1–1.0)
Monocytes Relative: 14 %
Neutro Abs: 3.2 K/uL (ref 1.7–7.7)
Neutrophils Relative %: 45 %
Platelets: 184 K/uL (ref 150–400)
RBC: 2.89 MIL/uL — ABNORMAL LOW (ref 3.87–5.11)
RDW: 17.1 % — ABNORMAL HIGH (ref 11.5–15.5)
WBC: 7 K/uL (ref 4.0–10.5)
nRBC: 0 % (ref 0.0–0.2)

## 2024-06-27 LAB — BASIC METABOLIC PANEL WITH GFR
Anion gap: 8 (ref 5–15)
BUN: 7 mg/dL — ABNORMAL LOW (ref 8–23)
CO2: 28 mmol/L (ref 22–32)
Calcium: 8.7 mg/dL — ABNORMAL LOW (ref 8.9–10.3)
Chloride: 102 mmol/L (ref 98–111)
Creatinine, Ser: 0.42 mg/dL — ABNORMAL LOW (ref 0.44–1.00)
GFR, Estimated: >60 mL/min (ref >60)
Glucose, Bld: 114 mg/dL — ABNORMAL HIGH (ref 70–99)
Potassium: 3.6 mmol/L (ref 3.5–5.1)
Sodium: 139 mmol/L (ref 135–145)

## 2024-06-27 LAB — MYCOPLASMA PNEUMONIAE ANTIBODY, IGM: Mycoplasma pneumo IgM: <770 U/mL (ref 0–769)

## 2024-06-27 MED ORDER — POLYETHYLENE GLYCOL 3350 17 G PO PACK: 17.0000 g | PACK | Freq: Every day | ORAL | Status: DC

## 2024-06-27 MED ORDER — POLYETHYLENE GLYCOL 3350 17 G PO PACK
17.0000 g | PACK | Freq: Every day | ORAL | Status: DC
  Filled 2024-06-27: qty 1

## 2024-06-27 MED ORDER — LACTULOSE 10 GM/15ML PO SOLN
20.0000 g | Freq: Two times a day (BID) | ORAL | Status: DC
  Administered 2024-06-27: 12:00:00 20 g via ORAL
  Filled 2024-06-27 (×2): qty 30

## 2024-06-27 MED ORDER — SENNA 8.6 MG PO TABS
1.0000 | ORAL_TABLET | Freq: Every day | ORAL | Status: DC
  Administered 2024-06-27 – 2024-06-28 (×2): 8.6 mg via ORAL
  Filled 2024-06-27 (×2): qty 1

## 2024-06-27 NOTE — Plan of Care (Signed)

## 2024-06-27 NOTE — Telephone Encounter (Signed)
 Pt is in the hospital.  Per MD, please r/s PET from 12/24 to the last week of December. (Secure chat sent to [facility] to r/s PET) Move appts with MD to 1 week after PET, MD only

## 2024-06-27 NOTE — Progress Notes (Signed)
 Progress Note   Patient: Stacey [name] [id number] DOB: 04-07-1961 DOA: 06/24/2024     3 DOS: the patient was seen and examined on 06/27/2024   Brief hospital course: Per H&P HPI  HPI: Stacey [name] is a 63 y.o. female with medical history significant of diffuse large B cell lymphoma with obstructing mass along left pelvic wall causing hydroureter and hydronephrosis status post stenting in September 2025, PE on Eliquis , presented with worsening of shortness of breath and fever.   Symptoms started about 4 days ago, patient started have brief frontal headache followed by chest congestion, shortness of breath at rest and spiking fever with Tmax 101-104, always in the evenings, with daytime temperature lower 100s, measured by husband at home, for which patient has been taking Tylenol  2-3 times a day.  She had no runny nose or sore throat.  Denied any cough, no chest pain.  Denied any abdominal pain no urinary symptoms no diarrhea.  She further denied any cough or choking after eat or drink.   ED Course: Temperature 100.4 pulse rate 76 blood pressure 130/80 O2 saturation 97% on room air.  Chest x-ray showed bilateral lower fields interstitial opacity consider viral atypical infection.  Blood work showed WBC 12.0 compared to baseline 2.0-3.0 with left shift and eosinophilia, BUN 7 creatinine 0.4 glucose 147 bicarb 23K 3.6.   Patient was given ceftriaxone  and azithromycin  in the ED.  Assessment and Plan: Sepsis community acquired pneumonia  Sepsis present on arrival, resolved WBC count 12,000, fever 100.4 F.   Patient has remained afebrile 12/14 (temp at that time 100.83F). Patient never had cough, but did note symptoms of nasal congestion and drainage.  Procalc 0.32.  Chest x-ray concerning for left lower lobe pneumonia.   -Continue ceftriaxone  and azithromycin , would discharge on cap coverage if reamins afebrile    LLE pain Patient has had left lower extremity pain since starting  chemotherapy she notes this is worsening recently.  On exam left lower extremity is this warm to touch compared to the right lower extremity but she does not have any erythema.  Venous ultrasound for DVT negative. MRI of LLE obatined read pending.   -F/u MRI LLE.      Eosinophilia On labs this AM. 1300/uL  -Patient recently started on macrobid , it is possible this is drug related.  -Will repeat CBC with differential in the AM.  -Also consiered infectious etiology however pateints fever is improving with antibiotics.  -Will consider reaching out to ID for options for UTI prophylaxis.   Diffuse large B Cell Last chemo 11/19 On prednisone  when she receives chemotherapy She is taking opioids chronically, will discuss with pharmacy when to resume her fentanyl  patch  H/o L ureteral obstruction No urinary symptoms or back pain.  UA not concerning for infection with less than 5 WBCs noted Outpt f/u w/ urology  Currently on macrobid  for prophylaxis, will need to resume ppx after  treating for pna. Consider transition to different antibiotic.    Normocytic anemia Continue to monitor Patient follows closely with hematology oncology  H/o PE  Continue eliquis       Subjective: no issues overnight.   Physical Exam: Vitals:   06/26/24 1957 06/27/24 0420 06/27/24 0758 06/27/24 1513  BP: 105/63 126/64 123/64 102/61  Pulse: 77 74 73 79  Resp: 18 17 16 17   Temp: 99.6 F (37.6 C) 99.4 F (37.4 C) 99.2 F (37.3 C) 99.6 F (37.6 C)  TempSrc:  Oral Oral   SpO2: 95%  93% 93% 96%  Weight:      Height:         Constitutional: In no distress.  Cardiovascular: Normal rate, regular rhythm. No lower extremity edema  Pulmonary: Non labored breathing on room air, no wheezing or rales.   Abdominal: Soft. Non distended and non tender Musculoskeletal: LLE mild TTP below the knee diffusely      Neurological: Alert and oriented to person, place, and time. Non focal  Skin: Skin is warm and dry.     Data Reviewed:     Latest Ref Rng & Units 06/27/2024    5:49 AM 06/26/2024    5:19 AM 06/25/2024    6:01 AM  BMP  Glucose 70 - 99 mg/dL 885  885  894   BUN 8 - 23 mg/dL 7  8  8    Creatinine 0.44 - 1.00 mg/dL 9.57  9.42  9.48   Sodium 135 - 145 mmol/L 139  143  140   Potassium 3.5 - 5.1 mmol/L 3.6  4.2  3.7   Chloride 98 - 111 mmol/L 102  106  104   CO2 22 - 32 mmol/L 28  30  26    Calcium 8.9 - 10.3 mg/dL 8.7  9.1  8.3   '    Latest Ref Rng & Units 06/27/2024    5:49 AM 06/26/2024    5:19 AM 06/25/2024    6:01 AM  CBC  WBC 4.0 - 10.5 K/uL 7.0  8.3  7.7   Hemoglobin 12.0 - 15.0 g/dL 9.1  9.7  9.1   Hematocrit 36.0 - 46.0 % 28.6  29.9  28.2   Platelets 150 - 400 K/uL 184  198  173      Family Communication: Husband at bedside   Disposition: Status is: Inpatient Remains inpatient appropriate because: IV antibiotics   Planned Discharge Destination: Home    Time spent: 35 minutes  Author: Alban Pepper, MD 06/27/2024 7:41 PM  For on call review [email or web address].

## 2024-06-27 NOTE — Plan of Care (Signed)
°  Problem: Education: Goal: Knowledge of General Education information will improve Description: Including pain rating scale, medication(s)/side effects and non-pharmacologic comfort measures Outcome: Progressing   Problem: Clinical Measurements: Goal: Respiratory complications will improve Outcome: Progressing Goal: Cardiovascular complication will be avoided Outcome: Progressing   Problem: Activity: Goal: Risk for activity intolerance will decrease Outcome: Not Progressing

## 2024-06-28 ENCOUNTER — Other Ambulatory Visit (HOSPITAL_COMMUNITY): Payer: Self-pay

## 2024-06-28 ENCOUNTER — Encounter (HOSPITAL_COMMUNITY): Payer: Self-pay

## 2024-06-28 ENCOUNTER — Other Ambulatory Visit: Payer: Self-pay

## 2024-06-28 ENCOUNTER — Encounter: Payer: Self-pay | Admitting: Oncology

## 2024-06-28 ENCOUNTER — Ambulatory Visit

## 2024-06-28 DIAGNOSIS — A419 Sepsis, unspecified organism: Secondary | ICD-10-CM

## 2024-06-28 DIAGNOSIS — Z960 Presence of urogenital implants: Secondary | ICD-10-CM

## 2024-06-28 DIAGNOSIS — J189 Pneumonia, unspecified organism: Secondary | ICD-10-CM | POA: Diagnosis not present

## 2024-06-28 DIAGNOSIS — C8330 Diffuse large B-cell lymphoma, unspecified site: Secondary | ICD-10-CM | POA: Diagnosis not present

## 2024-06-28 LAB — CBC WITH DIFFERENTIAL/PLATELET
Abs Immature Granulocytes: 0.04 K/uL (ref 0.00–0.07)
Basophils Absolute: 0 K/uL (ref 0.0–0.1)
Basophils Relative: 1 %
Eosinophils Absolute: 1.1 K/uL — ABNORMAL HIGH (ref 0.0–0.5)
Eosinophils Relative: 18 %
HCT: 28 % — ABNORMAL LOW (ref 36.0–46.0)
Hemoglobin: 8.9 g/dL — ABNORMAL LOW (ref 12.0–15.0)
Immature Granulocytes: 1 %
Lymphocytes Relative: 26 %
Lymphs Abs: 1.6 K/uL (ref 0.7–4.0)
MCH: 31.2 pg (ref 26.0–34.0)
MCHC: 31.8 g/dL (ref 30.0–36.0)
MCV: 98.2 fL (ref 80.0–100.0)
Monocytes Absolute: 0.9 K/uL (ref 0.1–1.0)
Monocytes Relative: 14 %
Neutro Abs: 2.5 K/uL (ref 1.7–7.7)
Neutrophils Relative %: 40 %
Platelets: 203 K/uL (ref 150–400)
RBC: 2.85 MIL/uL — ABNORMAL LOW (ref 3.87–5.11)
RDW: 16.9 % — ABNORMAL HIGH (ref 11.5–15.5)
WBC: 6.2 K/uL (ref 4.0–10.5)
nRBC: 0 % (ref 0.0–0.2)

## 2024-06-28 LAB — BASIC METABOLIC PANEL WITH GFR
Anion gap: 7 (ref 5–15)
BUN: 6 mg/dL — ABNORMAL LOW (ref 8–23)
CO2: 29 mmol/L (ref 22–32)
Calcium: 8.8 mg/dL — ABNORMAL LOW (ref 8.9–10.3)
Chloride: 104 mmol/L (ref 98–111)
Creatinine, Ser: 0.42 mg/dL — ABNORMAL LOW (ref 0.44–1.00)
GFR, Estimated: >60 mL/min (ref >60)
Glucose, Bld: 100 mg/dL — ABNORMAL HIGH (ref 70–99)
Potassium: 3.8 mmol/L (ref 3.5–5.1)
Sodium: 140 mmol/L (ref 135–145)

## 2024-06-28 LAB — CK: Total CK: 21 U/L — ABNORMAL LOW (ref 38–234)

## 2024-06-28 MED ORDER — FENTANYL 25 MCG/HR TD PT72
1.0000 | MEDICATED_PATCH | TRANSDERMAL | 0 refills | Status: AC
  Filled 2024-06-28: qty 5, 15d supply, fill #0

## 2024-06-28 MED ORDER — AMOXICILLIN-POT CLAVULANATE 875-125 MG PO TABS
1.0000 | ORAL_TABLET | Freq: Two times a day (BID) | ORAL | 0 refills | Status: AC
  Filled 2024-06-28: qty 4, 2d supply, fill #0

## 2024-06-28 MED ORDER — PREDNISONE 20 MG PO TABS: 20.0000 mg | ORAL_TABLET | Freq: Every day | ORAL | Status: DC

## 2024-06-28 MED ORDER — AMOXICILLIN-POT CLAVULANATE 875-125 MG PO TABS: 1.0000 | ORAL_TABLET | Freq: Two times a day (BID) | ORAL | Status: DC

## 2024-06-28 MED ORDER — PREDNISONE 20 MG PO TABS
20.0000 mg | ORAL_TABLET | Freq: Every day | ORAL | 0 refills | Status: DC
  Filled 2024-06-28: qty 5, 5d supply, fill #0

## 2024-06-28 NOTE — Plan of Care (Signed)
°  Problem: Education: Goal: Knowledge of General Education information will improve Description: Including pain rating scale, medication(s)/side effects and non-pharmacologic comfort measures Outcome: Adequate for Discharge   Problem: Health Behavior/Discharge Planning: Goal: Ability to manage health-related needs will improve Outcome: Adequate for Discharge   Problem: Clinical Measurements: Goal: Ability to maintain clinical measurements within normal limits will improve Outcome: Adequate for Discharge Goal: Will remain free from infection Outcome: Adequate for Discharge Goal: Diagnostic test results will improve Outcome: Adequate for Discharge Goal: Respiratory complications will improve Outcome: Adequate for Discharge Goal: Cardiovascular complication will be avoided Outcome: Adequate for Discharge   Problem: Activity: Goal: Risk for activity intolerance will decrease Outcome: Adequate for Discharge   Problem: Nutrition: Goal: Adequate nutrition will be maintained Outcome: Adequate for Discharge   Problem: Coping: Goal: Level of anxiety will decrease Outcome: Adequate for Discharge   Problem: Elimination: Goal: Will not experience complications related to bowel motility Outcome: Adequate for Discharge   Problem: Elimination: Goal: Will not experience complications related to bowel motility Outcome: Adequate for Discharge Goal: Will not experience complications related to urinary retention Outcome: Adequate for Discharge   Problem: Pain Managment: Goal: General experience of comfort will improve and/or be controlled Outcome: Adequate for Discharge   Problem: Safety: Goal: Ability to remain free from injury will improve Outcome: Adequate for Discharge   Problem: Skin Integrity: Goal: Risk for impaired skin integrity will decrease Outcome: Adequate for Discharge   Problem: Activity: Goal: Ability to tolerate increased activity will improve Outcome: Adequate  for Discharge   Problem: Clinical Measurements: Goal: Ability to maintain a body temperature in the normal range will improve Outcome: Adequate for Discharge   Problem: Respiratory: Goal: Ability to maintain adequate ventilation will improve Outcome: Adequate for Discharge Goal: Ability to maintain a clear airway will improve Outcome: Adequate for Discharge

## 2024-06-28 NOTE — Discharge Instructions (Signed)
 Pt and husband advised to make f/u appt regarding Left aLE pain and MRI results discussion with [facility] in  F/u Dr Babara on your next appt

## 2024-06-28 NOTE — Discharge Summary (Signed)
 Physician Discharge Summary   Patient: Stacey [name] MRN: [id number] DOB: [birth year]  Admit date:     06/24/2024  Discharge date: 06/28/2024  Discharge Physician: [name]   PCP: [name], PA-C   Recommendations at discharge:   PCP in 1 to 2 weeks patient and husband advised to make appointment with [facility] in [facility] to review MRI left leg and evaluate use rolling walker  Discharge Diagnoses: Principal Problem:   Pneumonia Active Problems:   Sepsis (HCC)   Diffuse large B-cell lymphoma (HCC)   CAP (community acquired pneumonia)  Stacey [name] is a 63 y.o. female with medical history significant of diffuse large B cell lymphoma with obstructing mass along left pelvic wall causing hydroureter and hydronephrosis status post stenting in September 2025, PE on Eliquis , presented with worsening of shortness of breath and fever.   Symptoms started about 4 days ago, patient started have brief frontal headache followed by chest congestion, shortness of breath at rest and spiking fever with Tmax 101-104, always in the evenings, with daytime temperature lower 100s, measured by husband at home, for which patient has been taking Tylenol  2-3 times a day Chest x-ray showed bilateral lower fields interstitial opacity consider viral atypical infection. Blood work showed WBC 12.0   Sepsis community acquired pneumonia  Sepsis present on arrival, resolved WBC count 12,000, fever 100.4 F.   Patient had elevated temp to 100.3 Fahrenheit overnight.  Patient never had cough, but did note symptoms of sinus infection.  Procalc 0.32.  Chest x-ray concerning for left lower lobe pneumonia.   -Continue ceftriaxone  and azithromycin -- switch to oral antibiotic -- sepsis improved -- patient overall feeling well  LLE pain since October 2025 Patient has had left lower extremity pain since starting chemotherapy she notes this is worsening recently.  On exam left lower extremity is this warm  to touch compared to the right lower extremity but she does not have any erythema.  -- Patient has been having pain since October. She has been using rolling walker at home. -- Venous ultrasound for DVT-- negative for DVT --MRI left lower extremity with contrast1. No acute osseous injury of the left lower leg. 2. Moderate tendinosis of the peroneus longus. 3. Mild muscle edema in the proximal anterior and peroneal compartment muscles. Mild muscle edema in the posteromedial aspect of the soleus muscle and inferior aspect of the medial gastrocnemius muscle. Findings are nonspecific and may be secondary to muscle strain versus myositis. 4. High-grade partial-thickness cartilage loss of the weight-bearing right medial femorotibial compartment. 5. Moderate partial-thickness cartilage loss of the weight-bearing left medial femorotibial compartment.  -- Discussed with patient and husband regarding follow-up with [facility] [place] regarding above results of MRI. In the meantime will give short course of steroids for information/myositis as indicated. -- Patient and husband agreeable with the plan. I did give them the report of the MRI result   Eosinophilia Worsened on labs this AM. 1100/uL  -follow-up as outpatient   Diffuse large B Cell Last chemo 11/19 On prednisone  when she receives chemotherapy She is taking opioids chronically, will discuss with pharmacy when to resume her fentanyl  patch patient follows with Dr. Babara   H/o L ureteral obstruction No urinary symptoms or back pain.  UA not concerning for infection with less than 5 WBCs noted Outpt f/u w/ urology  Currently on Keflex  for prophylaxis, will resume this after treating for pneumonia   Normocytic anemia Patient follows closely with hematology oncology   H/o PE  Continue eliquis    Discussed at length discharge planning with patient and husband at bedside. Both agreeable.        Pain control - [place]   Controlled Substance Reporting System database was reviewed. and patient was instructed, not to drive, operate heavy machinery, perform activities at heights, swimming or participation in water  activities or provide baby-sitting services while on Pain, Sleep and Anxiety Medications; until their outpatient Physician has advised to do so again. Also recommended to not to take more than prescribed Pain, Sleep and Anxiety Medications.  Consultants: none Procedures performed: none Disposition: Home Diet recommendation:  Cardiac diet DISCHARGE MEDICATION: Allergies as of 06/28/2024       Reactions   Propofol  Nausea Only   Patient states medication can be tolerated with an antiemetic before taking        Medication List     STOP taking these medications    cephALEXin  250 MG capsule Commonly known as: KEFLEX    lidocaine -prilocaine  cream Commonly known as: EMLA    potassium chloride  SA 20 MEQ tablet Commonly known as: KLOR-CON  M   tamsulosin  0.4 MG Caps capsule Commonly known as: FLOMAX        TAKE these medications    acyclovir  400 MG tablet Commonly known as: ZOVIRAX  Take 1 tablet (400 mg total) by mouth daily.   allopurinol  300 MG tablet Commonly known as: ZYLOPRIM  Take 1 tablet (300 mg total) by mouth daily.   amoxicillin -clavulanate 875-125 MG tablet Commonly known as: AUGMENTIN  Take 1 tablet by mouth 2 (two) times daily with a meal for 2 days. Start taking on: June 29, 2024   DULoxetine  30 MG capsule Commonly known as: CYMBALTA  Take 1 capsule (30 mg total) by mouth daily.   Eliquis  5 MG Tabs tablet Generic drug: apixaban  Take 2 tablets (10 mg total) by mouth 2 (two) times daily for 3 days, THEN 1 tablet (5 mg total) 2 (two) times daily. Start taking on: May 24, 2024   fentaNYL  25 MCG/HR Commonly known as: DURAGESIC  Place 1 patch onto the skin every 3 (three) days.   loratadine  10 MG dissolvable tablet Commonly known as: CLARITIN  REDITABS Take 10  mg by mouth daily.   magic mouthwash (multi-ingredient) oral suspension Swish and swallow 5-10 mLs 4 (four) times daily as needed for mouth pain.   methocarbamol  500 MG tablet Commonly known as: ROBAXIN  Take 500 mg by mouth every 8 (eight) hours as needed for muscle spasms.   nitrofurantoin  (macrocrystal-monohydrate) 100 MG capsule Commonly known as: Macrobid  Take 1 capsule (100 mg total) by mouth daily.   omeprazole  20 MG capsule Commonly known as: PRILOSEC TAKE 1 CAPSULE (20 MG TOTAL) BY MOUTH 2 (TWO) TIMES DAILY BEFORE A MEAL.   ondansetron  8 MG tablet Commonly known as: Zofran  Take 1 tablet (8 mg total) by mouth every 8 (eight) hours as needed for nausea or vomiting. Start on the third day after cyclophosphamide  chemotherapy.   oxybutynin  10 MG 24 hr tablet Commonly known as: DITROPAN -XL TAKE 1 TABLET BY MOUTH EVERY DAY   Oxycodone  HCl 10 MG Tabs Take 1 tablet (10 mg total) by mouth every 4 (four) hours as needed (pain).   polyethylene glycol powder 17 GM/SCOOP powder Commonly known as: GLYCOLAX /MIRALAX  Take 17 g by mouth daily as needed. Dissolve 1 capful (17g) in 4-8 ounces of liquid and take by mouth daily.   predniSONE  20 MG tablet Commonly known as: DELTASONE  Take 1 tablet (20 mg total) by mouth daily with breakfast. Start taking on: June 29, 2024 What changed:  how much to take when to take this   pregabalin  75 MG capsule Commonly known as: Lyrica  Take 1 capsule (75 mg total) by mouth 2 (two) times daily.   prochlorperazine  10 MG tablet Commonly known as: COMPAZINE  Take 1 tablet (10 mg total) by mouth every 6 (six) hours as needed for nausea or vomiting.   traZODone  50 MG tablet Commonly known as: DESYREL  Take 1 tablet (50 mg total) by mouth at bedtime as needed for sleep.        Follow-up Information     [name], PA-C. Schedule an appointment as soon as possible for a visit in 1 week(s).   Specialty: Physician Assistant Contact  information: [address] [place] [address] [phone]         Babara Call, MD. Go to.   Specialty: Oncology Why: on your next appt Contact information: [address] [place] [address] [phone]                Discharge Exam: Fredricka Weights   06/24/24 0729  Weight: 76.2 kg   Alert and oriented times three respiratory clear to auscultation no distress left ankle mild swelling. Some tenderness around the ankle. No redness of cellulitis neuro- grossly intact  Condition at discharge: fair  The results of significant diagnostics from this hospitalization (including imaging, microbiology, ancillary and laboratory) are listed below for reference.   Imaging Studies: MR TIBIA FIBULA LEFT W WO CONTRAST Result Date: 06/27/2024 CLINICAL DATA:  Progressive pain and swelling for 3 months EXAM: MRI OF LOWER LEFT EXTREMITY WITHOUT AND WITH CONTRAST TECHNIQUE: Multiplanar, multisequence MR imaging of the left lower leg was performed both before and after administration of intravenous contrast. CONTRAST:  7.5mL GADAVIST  GADOBUTROL  1 MMOL/ML IV SOLN COMPARISON:  None Available. FINDINGS: Bones/Joint/Cartilage No acute fracture or dislocation. Normal alignment. No joint effusion. Small chronic bone infarcts in the proximal tibial metaphysis bilaterally. High-grade partial-thickness cartilage loss of the weight-bearing right medial femorotibial compartment. Moderate partial-thickness cartilage loss of the weight-bearing left medial femorotibial compartment. Small Baker's cyst. Ligaments Collateral ligaments are intact. Muscles and Tendons Moderate tendinosis of the peroneus longus. Flexor, extensor, and Achilles tendons are intact. Visualized patellar tendon is intact. Mild muscle edema in the proximal anterior and peroneal compartment muscles. Mild muscle edema in the posteromedial aspect of the soleus muscle and inferior aspect of the medial gastrocnemius muscle. No  significant muscle atrophy. Soft tissue No fluid collection or hematoma.  No soft tissue mass. IMPRESSION: 1. No acute osseous injury of the left lower leg. 2. Moderate tendinosis of the peroneus longus. 3. Mild muscle edema in the proximal anterior and peroneal compartment muscles. Mild muscle edema in the posteromedial aspect of the soleus muscle and inferior aspect of the medial gastrocnemius muscle. Findings are nonspecific and may be secondary to muscle strain versus myositis. 4. High-grade partial-thickness cartilage loss of the weight-bearing right medial femorotibial compartment. 5. Moderate partial-thickness cartilage loss of the weight-bearing left medial femorotibial compartment. Electronically Signed   By: Julaine Blanch M.D.   On: 06/27/2024 12:21   US  Venous Img Lower Unilateral Left (DVT) Result Date: 06/26/2024 EXAM: ULTRASOUND DUPLEX OF THE LEFT LOWER EXTREMITY VEINS TECHNIQUE: Duplex ultrasound using B-mode/gray scaled imaging and Doppler spectral analysis and color flow was obtained of the deep venous structures of the left lower extremity. COMPARISON: 05/19/2024 CLINICAL HISTORY: increased swelling, and pain of LLE FINDINGS: The common femoral vein, femoral vein, popliteal vein, and posterior tibial vein  demonstrate normal compressibility with normal color flow and spectral analysis. Limited images of the contralateral right common femoral vein are normal. IMPRESSION: 1. No evidence of DVT in the left lower extremity. Electronically signed by: Dayne Hassell MD 06/26/2024 02:17 PM EST RP Workstation: [id number]   DG Chest 1 View Result Date: 06/25/2024 EXAM: 1 VIEW(S) XRAY OF THE CHEST 06/25/2024 06:54:00 AM COMPARISON: 06/24/2024 CLINICAL HISTORY: 63 year old female with possible pneumonia, lymphoma. FINDINGS: LINES, TUBES AND DEVICES: Right IJ Port-A-Cath stable in position. LUNGS AND PLEURA: New confluent left basilar opacities. Possible small pleural effusions. No pneumothorax. HEART AND  MEDIASTINUM: No acute abnormality of the cardiac and mediastinal silhouettes. BONES AND SOFT TISSUES: Partially imaged bilateral shoulder arthroplasties. No acute osseous abnormality. IMPRESSION: 1. Progressive left lung base pneumonia. Trace if any pleural effusion(s). Electronically signed by: Helayne Hurst MD 06/25/2024 06:59 AM EST RP Workstation: [id number]   DG Chest Port 1 View Result Date: 06/24/2024 EXAM: 1 VIEW(S) XRAY OF THE CHEST 06/24/2024 07:53:41 AM COMPARISON: 06/03/2024 CLINICAL HISTORY: 63 year old female undergoing lymphoma treatment with fever, chest congestion, and questionable sepsis. FINDINGS: LINES, TUBES AND DEVICES: Right chest wall Port-A-Cath in place with tip overlying the superior caval junction. LUNGS AND PLEURA: New bilateral pulmonary interstitial opacities, worst at the right lung base. No consolidation or pleural effusion. No pneumothorax. HEART AND MEDIASTINUM: No acute abnormality of the cardiac and mediastinal silhouettes. BONES AND SOFT TISSUES: Bilateral shoulder arthroplasties noted. No acute osseous abnormality. Negative visible bowel gas. IMPRESSION: 1. New bilateral interstitial pulmonary opacities, greatest at the right lung base. Consider viral / atypical respiratory infection. Electronically signed by: Helayne Hurst MD 06/24/2024 07:58 AM EST RP Workstation: [id number]   CT ABDOMEN PELVIS W CONTRAST Addendum Date: 06/04/2024 ADDENDUM REPORT: 06/04/2024 12:49 ADDENDUM: Please note there is a left posterior pelvic infiltrative mass abutting the left piriformis muscle and infiltrating into the left greater sciatic fragment. The mass appears subjectively smaller compared to prior CT and measures approximately 6.0 x 2.4 cm (previously 5.8 x 3.3 cm). There is loss of fat plane between the mass and distal left ureter. Electronically Signed   By: Vanetta Chou M.D.   On: 06/04/2024 12:49   Result Date: 06/04/2024 CLINICAL DATA:  Generalized abdominal pain. EXAM: CT  ABDOMEN AND PELVIS WITH CONTRAST TECHNIQUE: Multidetector CT imaging of the abdomen and pelvis was performed using the standard protocol following bolus administration of intravenous contrast. RADIATION DOSE REDUCTION: This exam was performed according to the departmental dose-optimization program which includes automated exposure control, adjustment of the mA and/or kV according to patient size and/or use of iterative reconstruction technique. CONTRAST:  OMNIPAQUE  IOHEXOL  300 MG/ML  SOLN COMPARISON:  CT abdomen pelvis dated 05/17/2024 FINDINGS: Lower chest: The visualized lung bases are clear. No intra-abdominal free air or free fluid. Hepatobiliary: No focal liver abnormality is seen. No gallstones, gallbladder wall thickening, or biliary dilatation. Pancreas: Unremarkable. No pancreatic ductal dilatation or surrounding inflammatory changes. Spleen: Normal in size without focal abnormality. Adrenals/Urinary Tract: The adrenal glands unremarkable. Left-sided pigtail ureteral catheter with tip in the left renal inferior pole collecting system and distal end in the urinary bladder. Mild left hydronephrosis similar or minimally decreased. There is enhancement of the left urothelium suggestive of ascending UTI. The right kidney is unremarkable. The right ureter and urinary bladder appear unremarkable. Stomach/Bowel: Moderate stool throughout the colon. There is no bowel obstruction or active inflammation. Appendectomy. Vascular/Lymphatic: The abdominal aorta and IVC unremarkable. No portal venous gas. There is no adenopathy. Reproductive: The uterus  is grossly unremarkable no suspicious adnexal masses. Other: None Musculoskeletal: Degenerative changes of the spine. No acute osseous pathology. IMPRESSION: 1. Left-sided pigtail ureteral catheter with similar or minimally decreased mild left hydronephrosis. 2. Enhancement of the left urothelium suggestive of ascending UTI. Correlation with urinalysis recommended. 3.  No bowel obstruction. Electronically Signed: By: Vanetta Chou M.D. On: 06/03/2024 13:30   DG Chest Port 1 View Result Date: 06/03/2024 CLINICAL DATA:  Questionable sepsis - evaluate for abnormality EXAM: PORTABLE CHEST 1 VIEW COMPARISON:  May 17, 2024 FINDINGS: The cardiomediastinal silhouette is unchanged in contour.RIGHT chest port with tip terminating over the SVC. No pleural effusion. No pneumothorax. No acute pleuroparenchymal abnormality. IMPRESSION: No acute cardiopulmonary abnormality. Electronically Signed   By: Corean Salter M.D.   On: 06/03/2024 11:56   MR Lumbar Spine W Wo Contrast Result Date: 05/31/2024 EXAM: MRI LUMBAR SPINE 05/31/2024 03:19:00 PM TECHNIQUE: Multiplanar multisequence MRI of the lumbar spine was performed with and without the administration of intravenous contrast. 7 mL (gadobutrol  (GADAVIST ) 1 MMOL/ML injection 7 mL GADOBUTROL  1 MMOL/ML IV SOLN). COMPARISON: CT abdomen and pelvis 05/17/2024. CLINICAL HISTORY: Metastatic disease evaluation; low back pain with numbness down left leg. History of lymphoma. FINDINGS: BONES AND ALIGNMENT: 5 lumbar type vertebrae. Mild lumbar dextroscoliosis. Grade 1 retrolisthesis of L1-L2 and L2-L3 and grade 1 anterolisthesis of L4-L5. No fracture or suspicious marrow lesion. Mild bilateral facet edema at L4-L5. SPINAL CORD: The conus medullaris terminates at L1-L2 and is normal in signal. No evidence of epidural tumor. SOFT TISSUES: No paraspinal mass. INCIDENTAL FINDINGS: Mild left hydronephrosis, similar to the prior CT. DISC LEVELS: Disc desiccation primarily from L1-L2 through L4-L5. Severe disc space narrowing at L1-L2 and mild to moderate narrowing at L2-L3 and L3-L4. T12-L1: A broad central disc protrusion results in mild spinal stenosis. Patent neural foramina. L1-L2: Disc bulging and mild facet hypertrophy without significant stenosis. L2-L3: Circumferential disc bulging mildly eccentric to the left and moderate to severe  facet hypertrophy result in mild left lateral recess stenosis and mild bilateral neural foraminal stenosis without spinal stenosis. L3-L4: Disc bulging, prominent epidural fat, and moderate to severe facet hypertrophy result in mild to moderate spinal stenosis, mild right greater than left lateral recess stenosis, and mild bilateral neural foraminal stenosis. L4-L5: Anterolisthesis with mild bulging of uncovered disc and severe facet hypertrophy result in mild bilateral lateral recess stenosis and mild bilateral neural foraminal stenosis without significant spinal stenosis. L5-S1: Severe facet hypertrophy without disc herniation or stenosis. IMPRESSION: 1. No evidence of metastatic disease in the lumbar spine. 2. Diffuse disc and facet degeneration with mild to moderate spinal stenosis at L3-L4. 3. Mild lateral recess and neural foraminal stenosis at L2-3 and L4-5. 4. Mild left hydronephrosis, similar to prior CT. Electronically signed by: Dasie Hamburg MD 05/31/2024 03:31 PM EST RP Workstation: [id number]    Microbiology: Results for orders placed or performed during the hospital encounter of 06/24/24  Blood Culture (routine x 2)     Status: None (Preliminary result)   Collection Time: 06/24/24  7:51 AM   Specimen: BLOOD  Result Value Ref Range Status   Specimen Description BLOOD BLOOD LEFT ARM  Final   Special Requests   Final    BOTTLES DRAWN AEROBIC AND ANAEROBIC Blood Culture results may not be optimal due to an inadequate volume of blood received in culture bottles   Culture   Final    NO GROWTH 4 DAYS Performed at [facility], [address] [place], [place]  [address]    Report Status PENDING  Incomplete  Blood Culture (routine x 2)     Status: None (Preliminary result)   Collection Time: 06/24/24  7:51 AM   Specimen: BLOOD  Result Value Ref Range Status   Specimen Description BLOOD BLOOD RIGHT ARM  Final   Special Requests   Final    BOTTLES DRAWN AEROBIC AND ANAEROBIC Blood  Culture results may not be optimal due to an inadequate volume of blood received in culture bottles   Culture   Final    NO GROWTH 4 DAYS Performed at [facility], [address] [place], [place] [address]    Report Status PENDING  Incomplete  Resp panel by RT-PCR (RSV, Flu A&B, Covid) Anterior Nasal Swab     Status: None   Collection Time: 06/24/24  7:51 AM   Specimen: Anterior Nasal Swab  Result Value Ref Range Status   SARS Coronavirus 2 by RT PCR NEGATIVE NEGATIVE Final    Comment: (NOTE) SARS-CoV-2 target nucleic acids are NOT DETECTED.  The SARS-CoV-2 RNA is generally detectable in upper respiratory specimens during the acute phase of infection. The lowest concentration of SARS-CoV-2 viral copies this assay can detect is 138 copies/mL. A negative result does not preclude SARS-Cov-2 infection and should not be used as the sole basis for treatment or other patient management decisions. A negative result may occur with  improper specimen collection/handling, submission of specimen other than nasopharyngeal swab, presence of viral mutation(s) within the areas targeted by this assay, and inadequate number of viral copies(<138 copies/mL). A negative result must be combined with clinical observations, patient history, and epidemiological information. The expected result is Negative.  Fact Sheet for Patients:  bloggercourse.com  Fact Sheet for Healthcare Providers:  seriousbroker.it  This test is no t yet approved or cleared by the [place]  FDA and  has been authorized for detection and/or diagnosis of SARS-CoV-2 by FDA under an Emergency Use Authorization (EUA). This EUA will remain  in effect (meaning this test can be used) for the duration of the COVID-19 declaration under Section 564(b)(1) of the Act, 21 U.S.C.section 360bbb-3(b)(1), unless the authorization is terminated  or revoked sooner.       Influenza  A by PCR NEGATIVE NEGATIVE Final   Influenza B by PCR NEGATIVE NEGATIVE Final    Comment: (NOTE) The Xpert Xpress SARS-CoV-2/FLU/RSV plus assay is intended as an aid in the diagnosis of influenza from Nasopharyngeal swab specimens and should not be used as a sole basis for treatment. Nasal washings and aspirates are unacceptable for Xpert Xpress SARS-CoV-2/FLU/RSV testing.  Fact Sheet for Patients: bloggercourse.com  Fact Sheet for Healthcare Providers: seriousbroker.it  This test is not yet approved or cleared by the [place]  FDA and has been authorized for detection and/or diagnosis of SARS-CoV-2 by FDA under an Emergency Use Authorization (EUA). This EUA will remain in effect (meaning this test can be used) for the duration of the COVID-19 declaration under Section 564(b)(1) of the Act, 21 U.S.C. section 360bbb-3(b)(1), unless the authorization is terminated or revoked.     Resp Syncytial Virus by PCR NEGATIVE NEGATIVE Final    Comment: (NOTE) Fact Sheet for Patients: bloggercourse.com  Fact Sheet for Healthcare Providers: seriousbroker.it  This test is not yet approved or cleared by the [place]  FDA and has been authorized for detection and/or diagnosis of SARS-CoV-2 by FDA under an Emergency Use Authorization (EUA). This EUA will remain in effect (meaning this test can be used) for  the duration of the COVID-19 declaration under Section 564(b)(1) of the Act, 21 U.S.C. section 360bbb-3(b)(1), unless the authorization is terminated or revoked.  Performed at [facility], [address] [place], [place] [address]   Respiratory (~20 pathogens) panel by PCR     Status: None   Collection Time: 06/24/24  3:00 PM   Specimen: Nasopharyngeal Swab; Respiratory  Result Value Ref Range Status   Adenovirus NOT DETECTED NOT DETECTED Final   Coronavirus 229E NOT  DETECTED NOT DETECTED Final    Comment: (NOTE) The Coronavirus on the Respiratory Panel, DOES NOT test for the novel  Coronavirus (2019 nCoV)    Coronavirus HKU1 NOT DETECTED NOT DETECTED Final   Coronavirus NL63 NOT DETECTED NOT DETECTED Final   Coronavirus OC43 NOT DETECTED NOT DETECTED Final   Metapneumovirus NOT DETECTED NOT DETECTED Final   Rhinovirus / Enterovirus NOT DETECTED NOT DETECTED Final   Influenza A NOT DETECTED NOT DETECTED Final   Influenza B NOT DETECTED NOT DETECTED Final   Parainfluenza Virus 1 NOT DETECTED NOT DETECTED Final   Parainfluenza Virus 2 NOT DETECTED NOT DETECTED Final   Parainfluenza Virus 3 NOT DETECTED NOT DETECTED Final   Parainfluenza Virus 4 NOT DETECTED NOT DETECTED Final   Respiratory Syncytial Virus NOT DETECTED NOT DETECTED Final   Bordetella pertussis NOT DETECTED NOT DETECTED Final   Bordetella Parapertussis NOT DETECTED NOT DETECTED Final   Chlamydophila pneumoniae NOT DETECTED NOT DETECTED Final   Mycoplasma pneumoniae NOT DETECTED NOT DETECTED Final    Comment: Performed at [facility], [address], [place], [place] [address]  MRSA Next Gen by PCR, Nasal     Status: None   Collection Time: 06/25/24  6:00 AM   Specimen: Nasal Mucosa; Nasal Swab  Result Value Ref Range Status   MRSA by PCR Next Gen NOT DETECTED NOT DETECTED Final    Comment: (NOTE) The GeneXpert MRSA Assay (FDA approved for NASAL specimens only), is one component of a comprehensive MRSA colonization surveillance program. It is not intended to diagnose MRSA infection nor to guide or monitor treatment for MRSA infections. Test performance is not FDA approved in patients less than 16 years old. Performed at [facility], [address] [place], [place] [address]     Labs: CBC: Recent Labs  Lab 06/24/24 [id number] 06/25/24 0601 06/26/24 0519 06/27/24 0549 06/28/24 0617  WBC 12.0* 7.7 8.3 7.0 6.2  NEUTROABS 9.5*  --  4.4 3.2 2.5  HGB 10.2* 9.1*  9.7* 9.1* 8.9*  HCT 30.8* 28.2* 29.9* 28.6* 28.0*  MCV 97.5 98.3 98.7 99.0 98.2  PLT 178 173 198 184 203   Basic Metabolic Panel: Recent Labs  Lab 06/24/24 0751 06/25/24 0601 06/26/24 0519 06/27/24 0549 06/28/24 0617  NA 134* 140 143 139 140  K 3.6 3.7 4.2 3.6 3.8  CL 100 104 106 102 104  CO2 23 26 30 28 29   GLUCOSE 147* 105* 114* 114* 100*  BUN 7* 8 8 7* 6*  CREATININE 0.48 0.51 0.57 0.42* 0.42*  CALCIUM 9.0 8.3* 9.1 8.7* 8.8*   Liver Function Tests: Recent Labs  Lab 06/24/24 0751  AST 15  ALT 7  ALKPHOS 62  BILITOT 0.4  PROT 6.2*  ALBUMIN 3.5   Discharge time spent: greater than 30 minutes.  Signed: [name], MD [facility] 06/28/2024

## 2024-06-29 LAB — CULTURE, BLOOD (ROUTINE X 2)
Culture: NO GROWTH
Culture: NO GROWTH

## 2024-06-30 ENCOUNTER — Other Ambulatory Visit: Payer: Self-pay

## 2024-07-02 ENCOUNTER — Other Ambulatory Visit: Payer: Self-pay | Admitting: Hospice and Palliative Medicine

## 2024-07-03 ENCOUNTER — Ambulatory Visit

## 2024-07-03 ENCOUNTER — Other Ambulatory Visit: Payer: Self-pay

## 2024-07-03 ENCOUNTER — Encounter: Payer: Self-pay | Admitting: Oncology

## 2024-07-03 MED ORDER — OXYCODONE HCL 10 MG PO TABS
10.0000 mg | ORAL_TABLET | ORAL | 0 refills | Status: DC | PRN
  Filled 2024-07-03: qty 60, 10d supply, fill #0

## 2024-07-04 ENCOUNTER — Inpatient Hospital Stay

## 2024-07-04 ENCOUNTER — Encounter: Payer: Self-pay | Admitting: Oncology

## 2024-07-04 ENCOUNTER — Inpatient Hospital Stay: Admitting: Oncology

## 2024-07-04 ENCOUNTER — Inpatient Hospital Stay: Admitting: Hospice and Palliative Medicine

## 2024-07-05 ENCOUNTER — Inpatient Hospital Stay

## 2024-07-05 ENCOUNTER — Ambulatory Visit: Admitting: Physical Therapy

## 2024-07-11 ENCOUNTER — Inpatient Hospital Stay: Admit: 2024-07-11

## 2024-07-11 DIAGNOSIS — C8330 Diffuse large B-cell lymphoma, unspecified site: Secondary | ICD-10-CM

## 2024-07-11 LAB — GLUCOSE, CAPILLARY: Glucose-Capillary: 119 mg/dL — ABNORMAL HIGH (ref 70–99)

## 2024-07-11 MED ORDER — FLUDEOXYGLUCOSE F - 18 (FDG) INJECTION
9.2200 | Freq: Once | INTRAVENOUS | Status: AC | PRN
  Administered 2024-07-11: 9.22 via INTRAVENOUS

## 2024-07-12 ENCOUNTER — Inpatient Hospital Stay: Admitting: Oncology

## 2024-07-12 ENCOUNTER — Other Ambulatory Visit

## 2024-07-12 ENCOUNTER — Inpatient Hospital Stay: Payer: Self-pay

## 2024-07-12 ENCOUNTER — Inpatient Hospital Stay

## 2024-07-12 ENCOUNTER — Ambulatory Visit

## 2024-07-12 ENCOUNTER — Telehealth: Payer: Self-pay | Admitting: *Deleted

## 2024-07-12 DIAGNOSIS — N1330 Unspecified hydronephrosis: Secondary | ICD-10-CM | POA: Diagnosis not present

## 2024-07-12 LAB — URINALYSIS, COMPLETE
Bilirubin, UA: NEGATIVE
Glucose, UA: NEGATIVE
Ketones, UA: NEGATIVE
Nitrite, UA: NEGATIVE
Protein,UA: NEGATIVE
Specific Gravity, UA: 1.01 (ref 1.005–1.030)
Urobilinogen, Ur: 1 mg/dL (ref 0.2–1.0)
pH, UA: 6 (ref 5.0–7.5)

## 2024-07-12 LAB — MICROSCOPIC EXAMINATION: RBC, Urine: >30 /HPF — AB (ref 0–2)

## 2024-07-12 NOTE — Telephone Encounter (Signed)
 Patient reports to triage RN that her port is sticking out further in chest wall. She wanted to know if this was anything to be concerned about.  Patient denies any pain to port site, fever, chills, or feeling unwell, redness, swelling, warmth, or drainage at port site,  new arm/neck swelling on port side or sudden shortness of breath or chest pain. Discussed w/patient that her port a cath may be more visible now because of recent weight loss, which can make the port stand out more than before. Patient was instructed on red flags that would prompt medical evaluation of port. She will keep her apts as scheduled on 07/21/24 with Dr. Babara. We can look at the port at that time, unless her she develops signs of infection or swelling/pain to port site. She thanked me for discussing her care with her.

## 2024-07-14 ENCOUNTER — Encounter: Payer: Self-pay | Admitting: Oncology

## 2024-07-14 ENCOUNTER — Inpatient Hospital Stay

## 2024-07-14 ENCOUNTER — Other Ambulatory Visit: Payer: Self-pay

## 2024-07-16 ENCOUNTER — Other Ambulatory Visit: Payer: Self-pay | Admitting: Hospice and Palliative Medicine

## 2024-07-16 ENCOUNTER — Other Ambulatory Visit: Payer: Self-pay

## 2024-07-17 ENCOUNTER — Other Ambulatory Visit: Payer: Self-pay

## 2024-07-17 ENCOUNTER — Encounter
Admission: RE | Admit: 2024-07-17 | Discharge: 2024-07-17 | Disposition: A | Discharge status: Home / Self Care | Source: Ambulatory Visit | Attending: Urology | Admitting: Urology

## 2024-07-17 ENCOUNTER — Encounter: Payer: Self-pay | Admitting: Oncology

## 2024-07-17 DIAGNOSIS — Z960 Presence of urogenital implants: Secondary | ICD-10-CM

## 2024-07-17 DIAGNOSIS — N1330 Unspecified hydronephrosis: Secondary | ICD-10-CM

## 2024-07-17 LAB — CULTURE, URINE COMPREHENSIVE

## 2024-07-17 MED ORDER — OXYBUTYNIN CHLORIDE ER 10 MG PO TB24: 10.0000 mg | ORAL_TABLET | Freq: Every day | ORAL | 1 refills | Status: AC

## 2024-07-17 MED ORDER — OXYCODONE HCL 10 MG PO TABS
10.0000 mg | ORAL_TABLET | ORAL | 0 refills | Status: DC | PRN
  Filled 2024-07-17: qty 60, 10d supply, fill #0

## 2024-07-17 MED ORDER — TAMSULOSIN HCL 0.4 MG PO CAPS: 0.4000 mg | ORAL_CAPSULE | Freq: Every day | ORAL | 1 refills | Status: AC

## 2024-07-17 NOTE — Patient Instructions (Addendum)
 Your procedure is scheduled on: 07/24/2024  Monday Report to the Registration Desk on the 1st floor of the [facility]. To find out your arrival time, please call [phone] between 1PM - 3PM on: Friday, Jan, 03/2025  If your arrival time is 6:00 am, do not arrive before that time as the [facility] entrance doors do not open until 6:00 am.  REMEMBER: Instructions that are not followed completely may result in serious medical risk, up to and including death; or upon the discretion of your surgeon and anesthesiologist your surgery may need to be rescheduled.  Do not eat food or drink anything after midnight the night before surgery.  No gum chewing or hard candies.  One week prior to surgery: Stop Anti-inflammatories (NSAIDS) such as Advil, Aleve, Ibuprofen, Motrin, Naproxen, Naprosyn and Aspirin  based products such as Excedrin, Goody's Powder, BC Powder. Stop ANY OVER THE COUNTER supplements until after surgery.  You may however, continue to take Tylenol  if needed for pain up until the day of surgery.  Continue taking all of your other prescription medications up until the day of surgery.  ON THE DAY OF SURGERY ONLY TAKE THESE MEDICATIONS WITH SIPS OF WATER :  acyclovir  (ZOVIRAX ) allopurinol  (ZYLOPRIM )  DULoxetine  (CYMBALTA ) nitrofurantoin , macrocrystal-monohydrate omeprazole  (PRILOSEC) -important  oxybutynin  (DITROPAN -XL) tamsulosin  (FLOMAX )   No Eliquis  the am of surgery.   No Alcohol for 24 hours before or after surgery.  No Smoking including e-cigarettes for 24 hours before surgery.  No chewable tobacco products for at least 6 hours before surgery.  No nicotine patches on the day of surgery.  Do not use any recreational drugs for at least a week (preferably 2 weeks) before your surgery.  Please be advised that the combination of cocaine and anesthesia may have negative outcomes, up to and including death. If you test positive for cocaine, your surgery will be  cancelled.  On the morning of surgery brush your teeth /brush or clean your dentures with toothpaste and water , you may rinse your mouth with mouthwash if you wish. Do not swallow any toothpaste or mouthwash.  You may need to shower on day of surgery.   Brush or clean your dentures.   Do not wear jewelry, make-up, hairpins, clips or nail polish.  Do not wear lotions, powders, or perfumes or deodorant  Do not shave body hair from the neck down 48 hours before surgery.  Contact lenses, hearing aids and dentures may not be worn into surgery.  Do not bring valuables to the hospital. [facility] is not responsible for any missing/lost belongings or valuables.   Notify your doctor if there is any change in your medical condition (cold, fever, infection).  Wear comfortable clothing (specific to your surgery type) to the hospital.   After surgery, you can help prevent lung complications by doing breathing exercises.  Take deep breaths and cough every 1-2 hours. Your doctor may order a device called an Incentive Spirometer to help you take deep breaths. If you are being admitted to the hospital overnight, leave your suitcase in the car. After surgery it may be brought to your room.   If you are being discharged the day of surgery, you will not be allowed to drive home. You will need a responsible individual to drive you home and stay with you for 24 hours after surgery.    Please call the Pre-admissions Testing Dept. at [phone] if you have any questions about these instructions.  Surgery Visitation Policy:  Patients having  surgery or a procedure may have two visitors.  Children under the age of 33 must have an adult with them who is not the patient.  Inpatient Visitation:    Visiting hours are 7 a.m. to 8 p.m. Up to four visitors are allowed at one time in a patient room. The visitors may rotate out with other people during the day.  One visitor age 62 or older may stay with  the patient overnight and must be in the room by 8 p.m.   Merchandiser, Retail to address health-related social needs:  [email or web address]

## 2024-07-18 ENCOUNTER — Ambulatory Visit

## 2024-07-18 ENCOUNTER — Other Ambulatory Visit: Payer: Self-pay

## 2024-07-18 ENCOUNTER — Ambulatory Visit: Payer: Self-pay | Admitting: Urology

## 2024-07-18 ENCOUNTER — Encounter: Payer: Self-pay | Admitting: Oncology

## 2024-07-18 MED ORDER — DOXYCYCLINE HYCLATE 100 MG PO CAPS
100.0000 mg | ORAL_CAPSULE | Freq: Two times a day (BID) | ORAL | 0 refills | Status: AC
  Filled 2024-07-18: qty 14, 7d supply, fill #0

## 2024-07-20 ENCOUNTER — Ambulatory Visit: Admitting: Physical Therapy

## 2024-07-20 NOTE — Progress Notes (Signed)
 Received medical clearance from Rankin Dike PA for recent hospitalization for pneumonia and anemia. Low risk and anemia has resolved. Updated labs sent from pcp office and placed on chart.

## 2024-07-21 ENCOUNTER — Inpatient Hospital Stay: Admitting: Hospice and Palliative Medicine

## 2024-07-21 ENCOUNTER — Encounter: Payer: Self-pay | Admitting: Oncology

## 2024-07-21 ENCOUNTER — Inpatient Hospital Stay: Attending: Oncology | Admitting: Oncology

## 2024-07-21 ENCOUNTER — Inpatient Hospital Stay

## 2024-07-21 ENCOUNTER — Inpatient Hospital Stay: Admitting: Internal Medicine

## 2024-07-21 VITALS — BP 128/83 | HR 83 | Temp 98.0°F | Ht 64.0 in | Wt 155.0 lb

## 2024-07-21 DIAGNOSIS — Z7901 Long term (current) use of anticoagulants: Secondary | ICD-10-CM | POA: Insufficient documentation

## 2024-07-21 DIAGNOSIS — R634 Abnormal weight loss: Secondary | ICD-10-CM | POA: Diagnosis not present

## 2024-07-21 DIAGNOSIS — C8330 Diffuse large B-cell lymphoma, unspecified site: Secondary | ICD-10-CM

## 2024-07-21 DIAGNOSIS — Z79891 Long term (current) use of opiate analgesic: Secondary | ICD-10-CM | POA: Diagnosis not present

## 2024-07-21 DIAGNOSIS — Z7952 Long term (current) use of systemic steroids: Secondary | ICD-10-CM | POA: Insufficient documentation

## 2024-07-21 DIAGNOSIS — I2699 Other pulmonary embolism without acute cor pulmonale: Secondary | ICD-10-CM

## 2024-07-21 DIAGNOSIS — N1330 Unspecified hydronephrosis: Secondary | ICD-10-CM | POA: Diagnosis not present

## 2024-07-21 DIAGNOSIS — C83398 Diffuse large b-cell lymphoma of other extranodal and solid organ sites: Secondary | ICD-10-CM | POA: Insufficient documentation

## 2024-07-21 DIAGNOSIS — G893 Neoplasm related pain (acute) (chronic): Secondary | ICD-10-CM | POA: Insufficient documentation

## 2024-07-21 DIAGNOSIS — G629 Polyneuropathy, unspecified: Secondary | ICD-10-CM | POA: Diagnosis not present

## 2024-07-21 DIAGNOSIS — Z79899 Other long term (current) drug therapy: Secondary | ICD-10-CM | POA: Diagnosis not present

## 2024-07-21 DIAGNOSIS — Z515 Encounter for palliative care: Secondary | ICD-10-CM | POA: Insufficient documentation

## 2024-07-21 DIAGNOSIS — T451X5D Adverse effect of antineoplastic and immunosuppressive drugs, subsequent encounter: Secondary | ICD-10-CM | POA: Diagnosis not present

## 2024-07-21 DIAGNOSIS — I2693 Single subsegmental pulmonary embolism without acute cor pulmonale: Secondary | ICD-10-CM | POA: Insufficient documentation

## 2024-07-21 DIAGNOSIS — Z5112 Encounter for antineoplastic immunotherapy: Secondary | ICD-10-CM | POA: Insufficient documentation

## 2024-07-21 DIAGNOSIS — G620 Drug-induced polyneuropathy: Secondary | ICD-10-CM | POA: Insufficient documentation

## 2024-07-21 DIAGNOSIS — N132 Hydronephrosis with renal and ureteral calculous obstruction: Secondary | ICD-10-CM | POA: Diagnosis not present

## 2024-07-21 DIAGNOSIS — R5383 Other fatigue: Secondary | ICD-10-CM | POA: Diagnosis not present

## 2024-07-21 MED ORDER — SODIUM CHLORIDE 0.9 % IV SOLN
Freq: Once | INTRAVENOUS | Status: AC
  Filled 2024-07-21: qty 250

## 2024-07-21 MED ORDER — DEXAMETHASONE SOD PHOSPHATE PF 10 MG/ML IJ SOLN
10.0000 mg | Freq: Once | INTRAMUSCULAR | Status: AC
  Administered 2024-07-21: 10 mg via INTRAVENOUS
  Filled 2024-07-21: qty 1

## 2024-07-21 NOTE — Assessment & Plan Note (Signed)
 Continue Oxycodone  5mg  Q4h PRN.  Fentanyl  patch 25mcg

## 2024-07-21 NOTE — Assessment & Plan Note (Signed)
 Patient has urinary stent placed, she will follow-up with urology for stent exchange

## 2024-07-21 NOTE — Progress Notes (Signed)
 "    Palliative Medicine [facility] at [facility] Telephone:([phone]) [phone] Fax:([phone]) [phone]   Name: Stacey [name] Date: 07/21/2024 MRN: [id number]  DOB: January 06, 1961  Patient Care Team: [name], DEVONNA as PCP - General (Physician Assistant) Babara Call, MD as Consulting Physician (Oncology)    REASON FOR CONSULTATION: Stacey [name] is a 64 y.o. female with multiple medical problems including diffuse large B-cell lymphoma on chemotherapy, history of left ureteral stent.  Patient has had pain and was referred to palliative care to address goals of manage ongoing symptoms.  SOCIAL HISTORY:     reports that she has never smoked. She has never used smokeless tobacco. She reports that she does not currently use alcohol. She reports that she does not use drugs.  Patient is married lives at home with her husband.  ADVANCE DIRECTIVES:    CODE STATUS:   PAST MEDICAL HISTORY: Past Medical History:  Diagnosis Date   Arthritis    left shoulder, neck, lower back  (01/11/2018)   Cancer (HCC)    lymphoma, on chemo   Chronic lower back pain    Family history of adverse reaction to anesthesia    Mother has nausea   GERD (gastroesophageal reflux disease)    Neuropathy    L leg   PONV (postoperative nausea and vomiting)    Pre-diabetes    Swelling 2025   left calf, left foot- Sept-Oct 2025   UTI (urinary tract infection) 03/20/2021    PAST SURGICAL HISTORY:  Past Surgical History:  Procedure Laterality Date   APPENDECTOMY     CYSTOSCOPY W/ URETERAL STENT PLACEMENT Left 04/08/2024   Procedure: CYSTOSCOPY, WITH RETROGRADE PYELOGRAM AND URETERAL STENT INSERTION;  Surgeon: Watt Rush, MD;  Location: [facility] ORS;  Service: Urology;  Laterality: Left;   IR IMAGING GUIDED PORT INSERTION  05/01/2024   JOINT REPLACEMENT     TOTAL SHOULDER ARTHROPLASTY Left 01/11/2018   TOTAL SHOULDER ARTHROPLASTY Left 01/11/2018   Procedure: LEFT TOTAL SHOULDER ARTHROPLASTY;   Surgeon: Sharl Selinda Dover, MD;  Location: [facility] OR;  Service: Orthopedics;  Laterality: Left;  2.5 hrs   TOTAL SHOULDER ARTHROPLASTY Right 02/08/2020   Procedure: TOTAL SHOULDER ARTHROPLASTY;  Surgeon: Sharl Selinda Dover, MD;  Location: [facility] OR;  Service: Orthopedics;  Laterality: Right;  2.5 hrs RNFA    HEMATOLOGY/ONCOLOGY HISTORY:  Oncology History  Diffuse large B-cell lymphoma (HCC)  04/08/2024 Imaging   CT renal stone study showed  1. Mild left hydroureteronephrosis with periureteral/peripelvic stranding, no urinary calculi. 2. Imaging findings are compatible with recently passed left ureteral stone versus acute left pyelonephritis.   04/08/2024 Imaging   CT angiogram abdomen/pelvis with and without contrast  1. Obstructing 5.9 x 4.5 cm enhancing mass along the left pelvic sidewall causing left hydroureter, hydronephrosis, delayed nephrogram, and nephromegaly; mass abuts the left adnexa with loss of the intervening fat plane. This is highly concerning for malignant neoplasm. Etiology indeterminate. It's possible that this could be arising from a primary urothelial neoplasm of the left ureter. Other primary neoplasms or metastatic disease not excluded. Correlation with tissue sampling is advised. 2. Patent abdominal vasculature without signs of mesenteric ischemia.   04/24/2024 Imaging   PET scan showed  1. The posterior left pelvic sidewall mass is hypermetabolic, consistent with active lymphoma. 2. Mild splenic hypermetabolism relative to the liver. No splenomegaly. Cannot exclude lymphomatous involvement. 3. New small right pleural effusion. 4. No active lymphoma within the chest or neck. Mildly decreased sensitivity exam secondary to  hypermetabolic brown fat. 5. Age advanced coronary artery atherosclerosis. Recommend assessment of coronary risk factors. 6.  Aortic Atherosclerosis    04/26/2024 Initial Diagnosis   Lymphoma (HCC)  Symptoms began on 03/10/2024 with  pain radiating down the left leg, associated with the sciatic nerve. Initially managed with high doses of ibuprofen, which provided some relief, but the pain worsened within a week, leading to an emergency room visit where she received a steroid injection in her lower back.  04/08/2024 she returned to the hospital due to increased pain. A CT scan revealed a pelvic mass obstructing the ureter, and a stent was placed by a urologist.  Patient underwent biopsy of pelvis mass Pathology showed 1. Lymph node, needle/core biopsy, 18G cores infilatratic left pelvic sidewall mass :       LARGE B-CELL LYMPHOMA.  soft      tissue with atypical lymphoid infiltrate comprised predominantly of medium to      large sized cells.  Abundant mitotic figures are present.  Focal areas of      necrosis are noted.  Immunohistochemical stains reveal the malignant cells are      positive for CD20, PAX5, BCL6 (subset), MUM1, Bcl-2 and are negative for CD10      and cyclin D1.  CD30 does not show any significant expression. CD15 highlights      neutrophils. The Ki-67 proliferation index is high approximately 70%.  This lymphoma has a activated B-cell immunophenotype.   EBER-ish is pending and will be reported in an addendum.  Also,High-grade B-cell lymphoma FISH panel will be   ordered to exclude a double/triple hit lymphoma and results will be reported in an addendum   She has experienced unintentional weight loss of 18 pounds since May, without any changes in diet or exercise. No night sweats, low-grade fever, or excessive sweating at night. Baseline health includes chronic back pain, acid reflux, prediabetes, and constipation.  She is taking gabapentin  100 mg once daily for nerve pain, described as sometimes dull and sometimes sharp. Oxycodone  5 mg every six hours for pain management, which helps reduce the pain slightly.  She notes reduced appetite, numbness, and tingling in her leg. Also mentions leg swelling,  particularly at night when less active.   04/26/2024 Cancer Staging   Staging form: Hodgkin and Non-Hodgkin Lymphoma, AJCC 8th Edition - Clinical stage from 04/26/2024: Stage II (Diffuse large B-cell lymphoma) - Signed by Babara Call, MD on 04/26/2024 Stage prefix: Initial diagnosis   05/01/2024 Bone Marrow Biopsy   Bone marrow biopsy results showed  BONE MARROW, ASPIRATE, CLOT, CORE:  - Mildly hypocellular bone marrow with otherwise orderly trilineage hematopoiesis  - No morphologic or immunophenotypic evidence of the patient's recently diagnosed large B-cell lymphoma    05/10/2024 -  Chemotherapy   Patient is on Treatment Plan : NON-HODGKINS LYMPHOMA R-CHOP q21d x 4 cycles       ALLERGIES:  is allergic to propofol .  MEDICATIONS:  Current Outpatient Medications  Medication Sig Dispense Refill   acyclovir  (ZOVIRAX ) 400 MG tablet Take 1 tablet (400 mg total) by mouth daily. 30 tablet 3   allopurinol  (ZYLOPRIM ) 300 MG tablet Take 1 tablet (300 mg total) by mouth daily. 30 tablet 3   apixaban  (ELIQUIS ) 5 MG TABS tablet Take 2 tablets (10 mg total) by mouth 2 (two) times daily for 3 days, THEN 1 tablet (5 mg total) 2 (two) times daily. 60 tablet 2   doxycycline  (VIBRAMYCIN ) 100 MG capsule Take 1 capsule (100 mg total)  by mouth every 12 (twelve) hours. 14 capsule 0   DULoxetine  (CYMBALTA ) 30 MG capsule Take 1 capsule (30 mg total) by mouth daily. 30 capsule 3   fentaNYL  (DURAGESIC ) 25 MCG/HR Place 1 patch onto the skin every 3 (three) days. 5 patch 0   loratadine  (CLARITIN ) 10 MG tablet Take 10 mg by mouth daily.     magic mouthwash (multi-ingredient) oral suspension Swish and swallow 5-10 mLs 4 (four) times daily as needed for mouth pain. (Patient not taking: Reported on 07/21/2024) 480 mL 1   methocarbamol  (ROBAXIN ) 500 MG tablet Take 500 mg by mouth every 8 (eight) hours as needed for muscle spasms.  0   nitrofurantoin , macrocrystal-monohydrate, (MACROBID ) 100 MG capsule Take 1 capsule (100  mg total) by mouth daily. 90 capsule 1   omeprazole  (PRILOSEC) 20 MG capsule TAKE 1 CAPSULE (20 MG TOTAL) BY MOUTH 2 (TWO) TIMES DAILY BEFORE A MEAL. (Patient taking differently: Take 20 mg by mouth daily as needed (acid reflux).) 180 capsule 0   ondansetron  (ZOFRAN ) 8 MG tablet Take 1 tablet (8 mg total) by mouth every 8 (eight) hours as needed for nausea or vomiting. Start on the third day after cyclophosphamide  chemotherapy. 30 tablet 1   oxybutynin  (DITROPAN -XL) 10 MG 24 hr tablet Take 1 tablet (10 mg total) by mouth daily. 30 tablet 1   Oxycodone  HCl 10 MG TABS Take 1 tablet (10 mg total) by mouth every 4 (four) hours as needed (pain). 60 tablet 0   polyethylene glycol powder (GLYCOLAX /MIRALAX ) 17 GM/SCOOP powder Take 17 g by mouth daily as needed. Dissolve 1 capful (17g) in 4-8 ounces of liquid and take by mouth daily. 238 g 1   pregabalin  (LYRICA ) 75 MG capsule Take 1 capsule (75 mg total) by mouth 2 (two) times daily. 60 capsule 2   prochlorperazine  (COMPAZINE ) 10 MG tablet Take 1 tablet (10 mg total) by mouth every 6 (six) hours as needed for nausea or vomiting. 30 tablet 6   tamsulosin  (FLOMAX ) 0.4 MG CAPS capsule Take 1 capsule (0.4 mg total) by mouth daily. 30 capsule 1   traZODone  (DESYREL ) 50 MG tablet Take 1 tablet (50 mg total) by mouth at bedtime as needed for sleep. 30 tablet 3   No current facility-administered medications for this visit.   Facility-Administered Medications Ordered in Other Visits  Medication Dose Route Frequency Provider Last Rate Last Admin   0.9 %  sodium chloride  infusion   Intravenous Once Yu, Zhou, MD       dexamethasone  (DECADRON ) injection 10 mg  10 mg Intravenous Once Babara Call, MD        VITAL SIGNS: There were no vitals taken for this visit. There were no vitals filed for this visit.  Estimated body mass index is 26.61 kg/m as calculated from the following:   Height as of an earlier encounter on 07/21/24: 5' 4 (1.626 m).   Weight as of an earlier  encounter on 07/21/24: 155 lb (70.3 kg).  LABS: CBC:    Component Value Date/Time   WBC 6.2 06/28/2024 0617   HGB 8.9 (L) 06/28/2024 0617   HGB 10.1 (L) 06/07/2024 0835   HGB 14.5 09/17/2012 2231   HCT 28.0 (L) 06/28/2024 0617   HCT 41.5 09/17/2012 2231   PLT 203 06/28/2024 0617   PLT 125 (L) 06/07/2024 0835   PLT 186 09/17/2012 2231   MCV 98.2 06/28/2024 0617   MCV 92 09/17/2012 2231   NEUTROABS 2.5 06/28/2024 0617   NEUTROABS  6.4 09/17/2012 2231   LYMPHSABS 1.6 06/28/2024 0617   LYMPHSABS 0.9 (L) 09/17/2012 2231   MONOABS 0.9 06/28/2024 0617   MONOABS 0.5 09/17/2012 2231   EOSABS 1.1 (H) 06/28/2024 0617   EOSABS 0.0 09/17/2012 2231   BASOSABS 0.0 06/28/2024 0617   BASOSABS 0.0 09/17/2012 2231   Comprehensive Metabolic Panel:    Component Value Date/Time   NA 140 06/28/2024 0617   NA 139 09/17/2012 2231   K 3.8 06/28/2024 0617   K 4.0 09/17/2012 2231   CL 104 06/28/2024 0617   CL 106 09/17/2012 2231   CO2 29 06/28/2024 0617   CO2 29 09/17/2012 2231   BUN 6 (L) 06/28/2024 0617   BUN 13 09/17/2012 2231   CREATININE 0.42 (L) 06/28/2024 0617   CREATININE 0.36 (L) 06/07/2024 0835   CREATININE 0.94 09/17/2012 2231   GLUCOSE 100 (H) 06/28/2024 0617   GLUCOSE 131 (H) 09/17/2012 2231   CALCIUM 8.8 (L) 06/28/2024 0617   CALCIUM 8.9 09/17/2012 2231   AST 15 06/24/2024 0751   AST 17 06/07/2024 0835   ALT 7 06/24/2024 0751   ALT 16 06/07/2024 0835   ALT 23 09/17/2012 2231   ALKPHOS 62 06/24/2024 0751   ALKPHOS 98 09/17/2012 2231   BILITOT 0.4 06/24/2024 0751   BILITOT 0.7 06/07/2024 0835   PROT 6.2 (L) 06/24/2024 0751   PROT 8.1 09/17/2012 2231   ALBUMIN 3.5 06/24/2024 0751   ALBUMIN 3.8 09/17/2012 2231    RADIOGRAPHIC STUDIES: NM PET Image Restag (PS) Skull Base To Thigh Result Date: 07/12/2024 EXAM: PET AND CT SKULL BASE TO MID THIGH 07/11/2024 10:41:49 AM TECHNIQUE: RADIOPHARMACEUTICAL: 9.22 mCi F-18 FDG Uptake time 60 minutes. Glucose level 119 mg/dl. Blood  pool SUV max equals 2.5. Liver SUV max equals 3.4. PET imaging was acquired from the base of the skull to the mid thighs. Non-contrast enhanced computed tomography was obtained for attenuation correction and anatomic localization. COMPARISON: 04/24/2024 CLINICAL HISTORY: lymphoma FINDINGS: HEAD AND NECK: No tracer avid lymph nodes within the soft tissues of the neck. CHEST: No tracer avid mediastinal, hilar, or axillary lymph nodes. Small bilateral pleural effusions, left greater than right. No tracer avid pulmonary nodule or mass identified. Coronary artery calcifications. ABDOMEN AND PELVIS: No abnormal tracer activity identified within the liver. No abnormal splenic hypermetabolism relative to liver uptake. Normal appearance of the adrenal glands. No tracer avid lymph nodes identified within the abdomen. Asymmetric increased soft tissue along the left pelvic sidewall is again noted. This currently measures 3.5 x 2.1 cm. The SUV max within this area is equal to 4.7, axial image 128. On the previous exam, this measured 3.7 x 3.3 cm with an SUV max of 14.1. A left-sided nephroureteral stent is in place. No hydronephrosis identified. Physiologic activity within the gastrointestinal and genitourinary systems. BONES AND SOFT TISSUE: No abnormal FDG activity localizes to the bones. No metabolically active aggressive osseous lesion. IMPRESSION: 1. Partial metabolic response (Deauville score 4) with decreased FDG uptake and decreased size of the left pelvic sidewall soft tissue (SUV max 4.7, previously 14.1), without new FDG-avid disease. 2. No other metabolically active disease identified. Electronically signed by: Waddell Calk MD 07/12/2024 05:59 AM EST RP Workstation: [id number]   MR TIBIA FIBULA LEFT W WO CONTRAST Result Date: 06/27/2024 CLINICAL DATA:  Progressive pain and swelling for 3 months EXAM: MRI OF LOWER LEFT EXTREMITY WITHOUT AND WITH CONTRAST TECHNIQUE: Multiplanar, multisequence MR imaging of the  left lower leg was performed both before and after  administration of intravenous contrast. CONTRAST:  7.5mL GADAVIST  GADOBUTROL  1 MMOL/ML IV SOLN COMPARISON:  None Available. FINDINGS: Bones/Joint/Cartilage No acute fracture or dislocation. Normal alignment. No joint effusion. Small chronic bone infarcts in the proximal tibial metaphysis bilaterally. High-grade partial-thickness cartilage loss of the weight-bearing right medial femorotibial compartment. Moderate partial-thickness cartilage loss of the weight-bearing left medial femorotibial compartment. Small Baker's cyst. Ligaments Collateral ligaments are intact. Muscles and Tendons Moderate tendinosis of the peroneus longus. Flexor, extensor, and Achilles tendons are intact. Visualized patellar tendon is intact. Mild muscle edema in the proximal anterior and peroneal compartment muscles. Mild muscle edema in the posteromedial aspect of the soleus muscle and inferior aspect of the medial gastrocnemius muscle. No significant muscle atrophy. Soft tissue No fluid collection or hematoma.  No soft tissue mass. IMPRESSION: 1. No acute osseous injury of the left lower leg. 2. Moderate tendinosis of the peroneus longus. 3. Mild muscle edema in the proximal anterior and peroneal compartment muscles. Mild muscle edema in the posteromedial aspect of the soleus muscle and inferior aspect of the medial gastrocnemius muscle. Findings are nonspecific and may be secondary to muscle strain versus myositis. 4. High-grade partial-thickness cartilage loss of the weight-bearing right medial femorotibial compartment. 5. Moderate partial-thickness cartilage loss of the weight-bearing left medial femorotibial compartment. Electronically Signed   By: Julaine Blanch M.D.   On: 06/27/2024 12:21   US  Venous Img Lower Unilateral Left (DVT) Result Date: 06/26/2024 EXAM: ULTRASOUND DUPLEX OF THE LEFT LOWER EXTREMITY VEINS TECHNIQUE: Duplex ultrasound using B-mode/gray scaled imaging and  Doppler spectral analysis and color flow was obtained of the deep venous structures of the left lower extremity. COMPARISON: 05/19/2024 CLINICAL HISTORY: increased swelling, and pain of LLE FINDINGS: The common femoral vein, femoral vein, popliteal vein, and posterior tibial vein demonstrate normal compressibility with normal color flow and spectral analysis. Limited images of the contralateral right common femoral vein are normal. IMPRESSION: 1. No evidence of DVT in the left lower extremity. Electronically signed by: Dayne Hassell MD 06/26/2024 02:17 PM EST RP Workstation: [id number]   DG Chest 1 View Result Date: 06/25/2024 EXAM: 1 VIEW(S) XRAY OF THE CHEST 06/25/2024 06:54:00 AM COMPARISON: 06/24/2024 CLINICAL HISTORY: 64 year old female with possible pneumonia, lymphoma. FINDINGS: LINES, TUBES AND DEVICES: Right IJ Port-A-Cath stable in position. LUNGS AND PLEURA: New confluent left basilar opacities. Possible small pleural effusions. No pneumothorax. HEART AND MEDIASTINUM: No acute abnormality of the cardiac and mediastinal silhouettes. BONES AND SOFT TISSUES: Partially imaged bilateral shoulder arthroplasties. No acute osseous abnormality. IMPRESSION: 1. Progressive left lung base pneumonia. Trace if any pleural effusion(s). Electronically signed by: Helayne Hurst MD 06/25/2024 06:59 AM EST RP Workstation: [id number]   DG Chest Port 1 View Result Date: 06/24/2024 EXAM: 1 VIEW(S) XRAY OF THE CHEST 06/24/2024 07:53:41 AM COMPARISON: 06/03/2024 CLINICAL HISTORY: 64 year old female undergoing lymphoma treatment with fever, chest congestion, and questionable sepsis. FINDINGS: LINES, TUBES AND DEVICES: Right chest wall Port-A-Cath in place with tip overlying the superior caval junction. LUNGS AND PLEURA: New bilateral pulmonary interstitial opacities, worst at the right lung base. No consolidation or pleural effusion. No pneumothorax. HEART AND MEDIASTINUM: No acute abnormality of the cardiac and mediastinal  silhouettes. BONES AND SOFT TISSUES: Bilateral shoulder arthroplasties noted. No acute osseous abnormality. Negative visible bowel gas. IMPRESSION: 1. New bilateral interstitial pulmonary opacities, greatest at the right lung base. Consider viral / atypical respiratory infection. Electronically signed by: Helayne Hurst MD 06/24/2024 07:58 AM EST RP Workstation: [id number]    PERFORMANCE STATUS (ECOG) : 2 -  Symptomatic, <50% confined to bed  Review of Systems Unless otherwise noted, a complete review of systems is negative.  Physical Exam General: NAD Pulmonary: clear ant fields Abdomen: soft, nontender, + bowel sounds GU: no suprapubic tenderness Extremities: no edema, no joint deformities Skin: no rashes Neurological: Weakness, lateral numbness left leg and ankle  IMPRESSION: Patient hospitalized 06/24/2024 to 06/28/2024 with sepsis from pneumonia.  Also continue to have left lower extremity pain with MRI concerning for myositis.  Patient has been referred to orthopedics and is pending steroid injection and nerve block.  Patient continues to take fentanyl /oxycodone  with good effect.  Denies any adverse effects.  Does not require refill of prescriptions today.  Patient pending IV fluids and supportive care.  She met today with Dr. Babara with discussion of dose reduction procedure chemotherapy.  PLAN: -Continue current scope of treatment - Continue fentanyl  and refill oxycodone  - Daily bowel regimen to prevent opioid-induced constipation - Continue duloxetine  -Follow-up telephone visit 2 months   Patient expressed understanding and was in agreement with this plan. She also understands that She can call the clinic at any time with any questions, concerns, or complaints.     Time Total: 15 minutes  Visit consisted of counseling and education dealing with the complex and emotionally intense issues of symptom management and palliative care in the setting of serious and potentially  life-threatening illness.Greater than 50%  of this time was spent counseling and coordinating care related to the above assessment and plan.  Signed by: Fonda Mower, PhD, NP-C   "

## 2024-07-21 NOTE — Assessment & Plan Note (Signed)
 Sciatic pain/neuropathic pain.   Pre existing symptoms prior to chemotherapy. Left hip/buttock pain has improved after chemo, however worse of calf and foot pain is worse. Suspect that she has radiculopathy. Acute chemotherapy neuro toxicity is possible, unilateral symptom favors radiculopathy.  MRI lumbar results were reviewed. Neurology recommendation was reviewed. no severe neuro-foraminal stenosis that would need to be addressed surgically. .  Discontinue Vincristine   Continue Liyrica and Cymbalta .-Patient was seen by Dr. Buckley and plan to switch care to another neurologist.  Currently she has appointment in May 2026 with possible early appointments if there is cancellation.

## 2024-07-21 NOTE — Assessment & Plan Note (Signed)
Eliquis 5 mg BID

## 2024-07-21 NOTE — Assessment & Plan Note (Addendum)
 DLBCL of left pelvic wall mass, Atypical t (8; 14), BCL2 BCL6 monosomy.  Negative for BCL2, BCL6 and MYC rearrangement PET scan evaluation showed mild hypermetabolism in spleen.  Nonspecific.  Likely stage I/II disease. Bone marrow biopsy is negative.   IPI score 1-2 Baseline Echo showed LVEF 55-60% Grade I diastolic dysfunction   10/29/205 Cycle 1 R CHOP  - hospitalized due to neutropenic fever, complicated UTI 05/31/2024 cycle 2 R -mini CHP with D3 GCSF.  Hold Vincristine  due to  left foot neuropathy  Patient tolerated chemotherapy poorly and hospitalized after each cycles of chemotherapy. Repeat PET scan results reviewed and discussed with patient. She has had a partial metabolic response with residual Deauville score 4 activity of left pelvic sidewall soft tissue.  She also has poor performance status at this point. Will discuss with Radonc for feasibility of radiation. Recommend Rituximab  single agent for consolidation

## 2024-07-21 NOTE — Progress Notes (Signed)
 Nutrition Follow-up:  Patient with large b cell lymphoma of pelvic mass.  Hospital admission 12/13-12/17 with sepsis from pneumonia.    Met with patient and daughter in clinic.  Reports that appetite is poor, no desire to eat.  Eats oatmeal in the am (~8am) and then eats again around 5pm (stew beef with vegetables last night).  Also having pain.  Some constipation.  Drinks pedialyte and water .  Also likes [employer] shakes and orgain.     Medications: reviewed  Labs: reviewed  Anthropometrics:   Weight 155 lb today  158 lb 4.8 oz on 11/19 169 lb 10/29 184 lb on 8/29   NUTRITION DIAGNOSIS: Inadequate oral intake continues    INTERVENTION:  Planning to see orthopedic for pain relief Encouraged high calorie oral nutrition supplement.  Discussed ways to make smoothie/shake using oral nutrition supplement Encouraged nibbling q 2 hours Discussed strategies for constipation relief Consider appetite stimulant if intake does not improve  Contact information provided  MONITORING, EVALUATION, GOAL: weight trends, intake   NEXT VISIT: to be determined  Davontae Prusinski B. Dasie SOLON, CSO, LDN Registered Dietitian [phone]

## 2024-07-22 ENCOUNTER — Encounter: Payer: Self-pay | Admitting: Oncology

## 2024-07-22 DIAGNOSIS — R634 Abnormal weight loss: Secondary | ICD-10-CM | POA: Insufficient documentation

## 2024-07-22 NOTE — Assessment & Plan Note (Addendum)
 Follow-up with nutritionist.  Encourage nutrition supplementation. She will get IV fluid and dexamethasone  10 mg x 1 for hydration /nausea today.

## 2024-07-22 NOTE — Progress Notes (Signed)
 " Hematology/Oncology Progress note [phone] [phone]      CHIEF COMPLAINTS/PURPOSE OF CONSULTATION:  DLBCL   ASSESSMENT & PLAN:   Cancer Staging  Diffuse large B-cell lymphoma (HCC) Staging form: Hodgkin and Non-Hodgkin Lymphoma, AJCC 8th Edition - Clinical stage from 04/26/2024: Stage II (Diffuse large B-cell lymphoma) - Signed by Babara Call, MD on 04/26/2024   Diffuse large B-cell lymphoma (HCC) DLBCL of left pelvic wall mass, Atypical t (8; 14), BCL2 BCL6 monosomy.  Negative for BCL2, BCL6 and MYC rearrangement PET scan evaluation showed mild hypermetabolism in spleen.  Nonspecific.  Likely stage I/II disease. Bone marrow biopsy is negative.   IPI score 1-2 Baseline Echo showed LVEF 55-60% Grade I diastolic dysfunction   10/29/205 Cycle 1 R CHOP  - hospitalized due to neutropenic fever, complicated UTI 05/31/2024 cycle 2 R -mini CHP with D3 GCSF.  Hold Vincristine  due to  left foot neuropathy  Patient tolerated chemotherapy poorly and hospitalized after each cycles of chemotherapy. Repeat PET scan results reviewed and discussed with patient. She has had a partial metabolic response with residual Deauville score 4 activity of left pelvic sidewall soft tissue.  She also has poor performance status at this point. Will discuss with Radonc for feasibility of radiation. Recommend Rituximab  single agent for consolidation  Neoplasm related pain Continue Oxycodone  5mg  Q4h PRN.  Fentanyl  patch 25mcg   Neuropathy Sciatic pain/neuropathic pain.   Pre existing symptoms prior to chemotherapy. Left hip/buttock pain has improved after chemo, however worse of calf and foot pain is worse. Suspect that she has radiculopathy. Acute chemotherapy neuro toxicity is possible, unilateral symptom favors radiculopathy.  MRI lumbar results were reviewed. Neurology recommendation was reviewed. no severe neuro-foraminal stenosis that would need to be addressed surgically. .   Discontinue Vincristine   Continue Liyrica and Cymbalta .-Patient was seen by Dr. Buckley and plan to switch care to another neurologist.  Currently she has appointment in May 2026 with possible early appointments if there is cancellation.   Pulmonary embolism (HCC) Eliquis  5mg  BID  Hydronephrosis of left kidney Patient has urinary stent placed, she will follow-up with urology for stent exchange  Weight loss Follow-up with nutritionist.  Encourage nutrition supplementation. She will get IV fluid and dexamethasone  10 mg x 1 for hydration /nausea today.   Orders Placed This Encounter  Procedures   CMP (Cancer Center only)    Standing Status:   Future    Expected Date:   07/21/2024    Expiration Date:   10/19/2024   CBC with Differential (Cancer Center Only)    Standing Status:   Future    Expected Date:   07/21/2024    Expiration Date:   10/19/2024   Uric acid    Standing Status:   Future    Expected Date:   07/21/2024    Expiration Date:   10/19/2024   Lactate dehydrogenase    Standing Status:   Future    Expected Date:   07/21/2024    Expiration Date:   10/19/2024   Follow up per LOS All questions were answered. The patient knows to call the clinic with any problems, questions or concerns.  Call Babara, MD, PhD [facility] Hematology Oncology 07/21/2024    HISTORY OF PRESENTING ILLNESS:  Stacey [name] 64 y.o. female presents to establish care for large B-cell lymphoma I have reviewed her chart and materials related to her cancer extensively and collaborated history with the patient. Summary of oncologic history is as follows: Oncology History  Diffuse large B-cell lymphoma (HCC)  04/08/2024 Imaging   CT renal stone study showed  1. Mild left hydroureteronephrosis with periureteral/peripelvic stranding, no urinary calculi. 2. Imaging findings are compatible with recently passed left ureteral stone versus acute left pyelonephritis.   04/08/2024 Imaging   CT angiogram abdomen/pelvis  with and without contrast  1. Obstructing 5.9 x 4.5 cm enhancing mass along the left pelvic sidewall causing left hydroureter, hydronephrosis, delayed nephrogram, and nephromegaly; mass abuts the left adnexa with loss of the intervening fat plane. This is highly concerning for malignant neoplasm. Etiology indeterminate. It's possible that this could be arising from a primary urothelial neoplasm of the left ureter. Other primary neoplasms or metastatic disease not excluded. Correlation with tissue sampling is advised. 2. Patent abdominal vasculature without signs of mesenteric ischemia.   04/24/2024 Imaging   PET scan showed  1. The posterior left pelvic sidewall mass is hypermetabolic, consistent with active lymphoma. 2. Mild splenic hypermetabolism relative to the liver. No splenomegaly. Cannot exclude lymphomatous involvement. 3. New small right pleural effusion. 4. No active lymphoma within the chest or neck. Mildly decreased sensitivity exam secondary to hypermetabolic brown fat. 5. Age advanced coronary artery atherosclerosis. Recommend assessment of coronary risk factors. 6.  Aortic Atherosclerosis    04/26/2024 Initial Diagnosis   Lymphoma (HCC)  Symptoms began on 03/10/2024 with pain radiating down the left leg, associated with the sciatic nerve. Initially managed with high doses of ibuprofen, which provided some relief, but the pain worsened within a week, leading to an emergency room visit where she received a steroid injection in her lower back.  04/08/2024 she returned to the hospital due to increased pain. A CT scan revealed a pelvic mass obstructing the ureter, and a stent was placed by a urologist.  Patient underwent biopsy of pelvis mass Pathology showed 1. Lymph node, needle/core biopsy, 18G cores infilatratic left pelvic sidewall mass :       LARGE B-CELL LYMPHOMA.  soft      tissue with atypical lymphoid infiltrate comprised predominantly of medium to      large  sized cells.  Abundant mitotic figures are present.  Focal areas of      necrosis are noted.  Immunohistochemical stains reveal the malignant cells are      positive for CD20, PAX5, BCL6 (subset), MUM1, Bcl-2 and are negative for CD10      and cyclin D1.  CD30 does not show any significant expression. CD15 highlights      neutrophils. The Ki-67 proliferation index is high approximately 70%.  This lymphoma has a activated B-cell immunophenotype.   EBER-ish is pending and will be reported in an addendum.  Also,High-grade B-cell lymphoma FISH panel will be   ordered to exclude a double/triple hit lymphoma and results will be reported in an addendum   She has experienced unintentional weight loss of 18 pounds since May, without any changes in diet or exercise. No night sweats, low-grade fever, or excessive sweating at night. Baseline health includes chronic back pain, acid reflux, prediabetes, and constipation.  She is taking gabapentin  100 mg once daily for nerve pain, described as sometimes dull and sometimes sharp. Oxycodone  5 mg every six hours for pain management, which helps reduce the pain slightly.  She notes reduced appetite, numbness, and tingling in her leg. Also mentions leg swelling, particularly at night when less active.   04/26/2024 Cancer Staging   Staging form: Hodgkin and Non-Hodgkin Lymphoma, AJCC 8th Edition - Clinical stage from 04/26/2024: Stage  II (Diffuse large B-cell lymphoma) - Signed by Babara Call, MD on 04/26/2024 Stage prefix: Initial diagnosis   05/01/2024 Bone Marrow Biopsy   Bone marrow biopsy results showed  BONE MARROW, ASPIRATE, CLOT, CORE:  - Mildly hypocellular bone marrow with otherwise orderly trilineage hematopoiesis  - No morphologic or immunophenotypic evidence of the patient's recently diagnosed large B-cell lymphoma    05/10/2024 -  Chemotherapy   Patient is on Treatment Plan : NON-HODGKINS LYMPHOMA R-CHOP q21d x 4 cycles     07/12/2024 Imaging   PET  scan showed  1. Partial metabolic response (Deauville score 4) with decreased FDG uptake and decreased size of the left pelvic sidewall soft tissue (SUV max 4.7, previously 14.1), without new FDG-avid disease. 2. No other metabolically active disease identified.    Discussed the use of AI scribe software for clinical note transcription with the patient, who gave verbal consent to proceed.   She has not tolerated standard chemotherapy regimens, requiring hospitalization after each cycle for complications including pneumonia and recurrent urinary tract infections. Vincristine  was discontinued after cycle one due to reports of neuropathy. She remains very weak and has lost 15 pounds since early December 2025. She is able to eat and drink, though appetite is variable and she sometimes experiences nausea, with vomiting the morning of the visit. She uses protein drinks infrequently and is scheduled to see a nutritionist.  She urinary tract obstruction due to left sidewall soft tissue, status post ureteral stent placement..  She has upcoming procedure for stent exchange.   Due to the recurrent history of UTI during immunocompromised state after chemotherapy, she is on chronic suppressive nitrofurantoin  therapy and uses probiotics. She denies current fevers, chills, sweats, diarrhea, hematuria, or dysuria.  She continues to experience persistent lower extremity tingling, swelling, and pain, attributed to chemotherapy-induced peripheral neuropathy and pre-existing sciatic nerve issues. Lyrica  has provided limited benefit. She is under the care of an orthopedic physician and is scheduled for piriformis muscle and nerve root injections. Neurology follow-up for nerve conduction studies is scheduled for May. She is managed with oxycodone  and a fentanyl  patch for pain, which she finds helpful. She also takes trazodone  for sleep and omeprazole  for acid reflux, which is controlled. She has noticed her port has become  hard and more prominent, possibly due to weight loss.    MEDICAL HISTORY:  Past Medical History:  Diagnosis Date   Arthritis    left shoulder, neck, lower back  (01/11/2018)   Cancer (HCC)    lymphoma, on chemo   Chronic lower back pain    Family history of adverse reaction to anesthesia    Mother has nausea   GERD (gastroesophageal reflux disease)    Neuropathy    L leg   PONV (postoperative nausea and vomiting)    Pre-diabetes    Swelling 2025   left calf, left foot- Sept-Oct 2025   UTI (urinary tract infection) 03/20/2021    SURGICAL HISTORY: Past Surgical History:  Procedure Laterality Date   APPENDECTOMY     CYSTOSCOPY W/ URETERAL STENT PLACEMENT Left 04/08/2024   Procedure: CYSTOSCOPY, WITH RETROGRADE PYELOGRAM AND URETERAL STENT INSERTION;  Surgeon: Watt Rush, MD;  Location: [facility] ORS;  Service: Urology;  Laterality: Left;   IR IMAGING GUIDED PORT INSERTION  05/01/2024   JOINT REPLACEMENT     TOTAL SHOULDER ARTHROPLASTY Left 01/11/2018   TOTAL SHOULDER ARTHROPLASTY Left 01/11/2018   Procedure: LEFT TOTAL SHOULDER ARTHROPLASTY;  Surgeon: Sharl Selinda Dover, MD;  Location: [facility] OR;  Service: Orthopedics;  Laterality: Left;  2.5 hrs   TOTAL SHOULDER ARTHROPLASTY Right 02/08/2020   Procedure: TOTAL SHOULDER ARTHROPLASTY;  Surgeon: Sharl Selinda Dover, MD;  Location: [facility];  Service: Orthopedics;  Laterality: Right;  2.5 hrs RNFA    SOCIAL HISTORY: Social History   Socioeconomic History   Marital status: Married    Spouse name: Blandina Renaldo   Number of children: 0   Years of education: Not on file   Highest education level: 12th grade  Occupational History   Not on file  Tobacco Use   Smoking status: Never   Smokeless tobacco: Never  Vaping Use   Vaping status: Never Used  Substance and Sexual Activity   Alcohol use: Not Currently    Comment: 01/11/2018 might have 1 drink/month   Drug use: Never   Sexual activity: Yes  Other Topics Concern   Not on  file  Social History Narrative   Lives at home with husband   Social Drivers of Health   Tobacco Use: Low Risk (07/21/2024)   Patient History    Smoking Tobacco Use: Never    Smokeless Tobacco Use: Never    Passive Exposure: Not on file  Financial Resource Strain: Low Risk  (03/30/2024)   Received [facility]   Overall Financial Resource Strain (CARDIA)    Difficulty of Paying Living Expenses: Not hard at all  Food Insecurity: No Food Insecurity (06/24/2024)   Epic    Worried About Radiation Protection Practitioner of Food in the Last Year: Never true    Ran Out of Food in the Last Year: Never true  Transportation Needs: No Transportation Needs (06/24/2024)   Epic    Lack of Transportation (Medical): No    Lack of Transportation (Non-Medical): No  Physical Activity: Not on file  Stress: Not on file  Social Connections: Socially Integrated (05/17/2024)   Social Connection and Isolation Panel    Frequency of Communication with Friends and Family: Three times a week    Frequency of Social Gatherings with Friends and Family: Three times a week    Attends Religious Services: 1 to 4 times per year    Active Member of Clubs or Organizations: Yes    Attends Banker Meetings: 1 to 4 times per year    Marital Status: Married  Catering Manager Violence: Not At Risk (06/24/2024)   Epic    Fear of Current or Ex-Partner: No    Emotionally Abused: No    Physically Abused: No    Sexually Abused: No  Depression (PHQ2-9): Low Risk (07/21/2024)   Depression (PHQ2-9)    PHQ-2 Score: 0  Alcohol Screen: Not on file  Housing: Low Risk (06/24/2024)   Epic    Unable to Pay for Housing in the Last Year: No    Number of Times Moved in the Last Year: 0    Homeless in the Last Year: No  Utilities: Not At Risk (06/24/2024)   Epic    Threatened with loss of utilities: No  Health Literacy: Not on file    FAMILY HISTORY: Family History  Problem Relation Age of Onset   Hypertension  Mother    Heart disease Father    Heart attack Father    CAD Father    Colon cancer Neg Hx    Esophageal cancer Neg Hx    Rectal cancer Neg Hx    Stomach cancer Neg Hx     ALLERGIES:  is allergic to propofol .  MEDICATIONS:  Current Outpatient Medications  Medication Sig Dispense Refill   acyclovir  (ZOVIRAX ) 400 MG tablet Take 1 tablet (400 mg total) by mouth daily. 30 tablet 3   allopurinol  (ZYLOPRIM ) 300 MG tablet Take 1 tablet (300 mg total) by mouth daily. 30 tablet 3   apixaban  (ELIQUIS ) 5 MG TABS tablet Take 2 tablets (10 mg total) by mouth 2 (two) times daily for 3 days, THEN 1 tablet (5 mg total) 2 (two) times daily. 60 tablet 2   doxycycline  (VIBRAMYCIN ) 100 MG capsule Take 1 capsule (100 mg total) by mouth every 12 (twelve) hours. 14 capsule 0   DULoxetine  (CYMBALTA ) 30 MG capsule Take 1 capsule (30 mg total) by mouth daily. 30 capsule 3   fentaNYL  (DURAGESIC ) 25 MCG/HR Place 1 patch onto the skin every 3 (three) days. 5 patch 0   loratadine  (CLARITIN ) 10 MG tablet Take 10 mg by mouth daily.     methocarbamol  (ROBAXIN ) 500 MG tablet Take 500 mg by mouth every 8 (eight) hours as needed for muscle spasms.  0   nitrofurantoin , macrocrystal-monohydrate, (MACROBID ) 100 MG capsule Take 1 capsule (100 mg total) by mouth daily. 90 capsule 1   omeprazole  (PRILOSEC) 20 MG capsule TAKE 1 CAPSULE (20 MG TOTAL) BY MOUTH 2 (TWO) TIMES DAILY BEFORE A MEAL. (Patient taking differently: Take 20 mg by mouth daily as needed (acid reflux).) 180 capsule 0   ondansetron  (ZOFRAN ) 8 MG tablet Take 1 tablet (8 mg total) by mouth every 8 (eight) hours as needed for nausea or vomiting. Start on the third day after cyclophosphamide  chemotherapy. 30 tablet 1   oxybutynin  (DITROPAN -XL) 10 MG 24 hr tablet Take 1 tablet (10 mg total) by mouth daily. 30 tablet 1   Oxycodone  HCl 10 MG TABS Take 1 tablet (10 mg total) by mouth every 4 (four) hours as needed (pain). 60 tablet 0   polyethylene glycol powder  (GLYCOLAX /MIRALAX ) 17 GM/SCOOP powder Take 17 g by mouth daily as needed. Dissolve 1 capful (17g) in 4-8 ounces of liquid and take by mouth daily. 238 g 1   pregabalin  (LYRICA ) 75 MG capsule Take 1 capsule (75 mg total) by mouth 2 (two) times daily. 60 capsule 2   prochlorperazine  (COMPAZINE ) 10 MG tablet Take 1 tablet (10 mg total) by mouth every 6 (six) hours as needed for nausea or vomiting. 30 tablet 6   tamsulosin  (FLOMAX ) 0.4 MG CAPS capsule Take 1 capsule (0.4 mg total) by mouth daily. 30 capsule 1   traZODone  (DESYREL ) 50 MG tablet Take 1 tablet (50 mg total) by mouth at bedtime as needed for sleep. 30 tablet 3   magic mouthwash (multi-ingredient) oral suspension Swish and swallow 5-10 mLs 4 (four) times daily as needed for mouth pain. (Patient not taking: Reported on 07/21/2024) 480 mL 1   No current facility-administered medications for this visit.    Review of Systems  Constitutional:  Positive for appetite change, fatigue and unexpected weight change. Negative for chills and fever.  HENT:   Negative for hearing loss and voice change.   Eyes:  Negative for eye problems.  Respiratory:  Negative for chest tightness and cough.   Cardiovascular:  Negative for chest pain.  Gastrointestinal:  Negative for abdominal distention, abdominal pain and blood in stool.  Endocrine: Negative for hot flashes.  Genitourinary:  Negative for difficulty urinating and frequency.   Musculoskeletal:  Positive for back pain. Negative for arthralgias.  Skin:  Negative for itching and rash.  Neurological:  Positive for numbness.  Negative for extremity weakness.  Hematological:  Negative for adenopathy.  Psychiatric/Behavioral:  Negative for confusion.      PHYSICAL EXAMINATION: ECOG PERFORMANCE STATUS: 2 - Symptomatic, <50% confined to bed  Vitals:   07/21/24 1109  BP: 128/83  Pulse: 83  Temp: 98 F (36.7 C)  SpO2: 98%   Filed Weights   07/21/24 1109  Weight: 155 lb (70.3 kg)    Physical  Exam Constitutional:      General: She is not in acute distress.    Appearance: She is not diaphoretic.  HENT:     Head: Normocephalic and atraumatic.  Eyes:     General: No scleral icterus. Cardiovascular:     Rate and Rhythm: Normal rate and regular rhythm.  Pulmonary:     Effort: Pulmonary effort is normal. No respiratory distress.     Breath sounds: Normal breath sounds. No wheezing.  Abdominal:     General: There is no distension.     Palpations: Abdomen is soft.     Tenderness: There is no abdominal tenderness.  Musculoskeletal:     Cervical back: Normal range of motion and neck supple.     Right lower leg: No edema.     Left lower leg: No edema.  Skin:    General: Skin is warm and dry.     Findings: No erythema.  Neurological:     Mental Status: She is alert and oriented to person, place, and time. Mental status is at baseline.     Motor: No abnormal muscle tone.  Psychiatric:        Mood and Affect: Mood and affect normal.      LABORATORY DATA:  I have reviewed the data as listed    Latest Ref Rng & Units 06/28/2024    6:17 AM 06/27/2024    5:49 AM 06/26/2024    5:19 AM  CBC  WBC 4.0 - 10.5 K/uL 6.2  7.0  8.3   Hemoglobin 12.0 - 15.0 g/dL 8.9  9.1  9.7   Hematocrit 36.0 - 46.0 % 28.0  28.6  29.9   Platelets 150 - 400 K/uL 203  184  198       Latest Ref Rng & Units 06/28/2024    6:17 AM 06/27/2024    5:49 AM 06/26/2024    5:19 AM  CMP  Glucose 70 - 99 mg/dL 899  885  885   BUN 8 - 23 mg/dL 6  7  8    Creatinine 0.44 - 1.00 mg/dL 9.57  9.57  9.42   Sodium 135 - 145 mmol/L 140  139  143   Potassium 3.5 - 5.1 mmol/L 3.8  3.6  4.2   Chloride 98 - 111 mmol/L 104  102  106   CO2 22 - 32 mmol/L 29  28  30    Calcium 8.9 - 10.3 mg/dL 8.8  8.7  9.1      RADIOGRAPHIC STUDIES: I have personally reviewed the radiological images as listed and agreed with the findings in the report. NM PET Image Restag (PS) Skull Base To Thigh Result Date: 07/12/2024 EXAM: PET  AND CT SKULL BASE TO MID THIGH 07/11/2024 10:41:49 AM TECHNIQUE: RADIOPHARMACEUTICAL: 9.22 mCi F-18 FDG Uptake time 60 minutes. Glucose level 119 mg/dl. Blood pool SUV max equals 2.5. Liver SUV max equals 3.4. PET imaging was acquired from the base of the skull to the mid thighs. Non-contrast enhanced computed tomography was obtained for attenuation correction and anatomic localization. COMPARISON: 04/24/2024 CLINICAL  HISTORY: lymphoma FINDINGS: HEAD AND NECK: No tracer avid lymph nodes within the soft tissues of the neck. CHEST: No tracer avid mediastinal, hilar, or axillary lymph nodes. Small bilateral pleural effusions, left greater than right. No tracer avid pulmonary nodule or mass identified. Coronary artery calcifications. ABDOMEN AND PELVIS: No abnormal tracer activity identified within the liver. No abnormal splenic hypermetabolism relative to liver uptake. Normal appearance of the adrenal glands. No tracer avid lymph nodes identified within the abdomen. Asymmetric increased soft tissue along the left pelvic sidewall is again noted. This currently measures 3.5 x 2.1 cm. The SUV max within this area is equal to 4.7, axial image 128. On the previous exam, this measured 3.7 x 3.3 cm with an SUV max of 14.1. A left-sided nephroureteral stent is in place. No hydronephrosis identified. Physiologic activity within the gastrointestinal and genitourinary systems. BONES AND SOFT TISSUE: No abnormal FDG activity localizes to the bones. No metabolically active aggressive osseous lesion. IMPRESSION: 1. Partial metabolic response (Deauville score 4) with decreased FDG uptake and decreased size of the left pelvic sidewall soft tissue (SUV max 4.7, previously 14.1), without new FDG-avid disease. 2. No other metabolically active disease identified. Electronically signed by: Waddell Calk MD 07/12/2024 05:59 AM EST RP Workstation: [id number]   MR TIBIA FIBULA LEFT W WO CONTRAST Result Date: 06/27/2024 CLINICAL DATA:   Progressive pain and swelling for 3 months EXAM: MRI OF LOWER LEFT EXTREMITY WITHOUT AND WITH CONTRAST TECHNIQUE: Multiplanar, multisequence MR imaging of the left lower leg was performed both before and after administration of intravenous contrast. CONTRAST:  7.5mL GADAVIST  GADOBUTROL  1 MMOL/ML IV SOLN COMPARISON:  None Available. FINDINGS: Bones/Joint/Cartilage No acute fracture or dislocation. Normal alignment. No joint effusion. Small chronic bone infarcts in the proximal tibial metaphysis bilaterally. High-grade partial-thickness cartilage loss of the weight-bearing right medial femorotibial compartment. Moderate partial-thickness cartilage loss of the weight-bearing left medial femorotibial compartment. Small Baker's cyst. Ligaments Collateral ligaments are intact. Muscles and Tendons Moderate tendinosis of the peroneus longus. Flexor, extensor, and Achilles tendons are intact. Visualized patellar tendon is intact. Mild muscle edema in the proximal anterior and peroneal compartment muscles. Mild muscle edema in the posteromedial aspect of the soleus muscle and inferior aspect of the medial gastrocnemius muscle. No significant muscle atrophy. Soft tissue No fluid collection or hematoma.  No soft tissue mass. IMPRESSION: 1. No acute osseous injury of the left lower leg. 2. Moderate tendinosis of the peroneus longus. 3. Mild muscle edema in the proximal anterior and peroneal compartment muscles. Mild muscle edema in the posteromedial aspect of the soleus muscle and inferior aspect of the medial gastrocnemius muscle. Findings are nonspecific and may be secondary to muscle strain versus myositis. 4. High-grade partial-thickness cartilage loss of the weight-bearing right medial femorotibial compartment. 5. Moderate partial-thickness cartilage loss of the weight-bearing left medial femorotibial compartment. Electronically Signed   By: Julaine Blanch M.D.   On: 06/27/2024 12:21   US  Venous Img Lower Unilateral Left  (DVT) Result Date: 06/26/2024 EXAM: ULTRASOUND DUPLEX OF THE LEFT LOWER EXTREMITY VEINS TECHNIQUE: Duplex ultrasound using B-mode/gray scaled imaging and Doppler spectral analysis and color flow was obtained of the deep venous structures of the left lower extremity. COMPARISON: 05/19/2024 CLINICAL HISTORY: increased swelling, and pain of LLE FINDINGS: The common femoral vein, femoral vein, popliteal vein, and posterior tibial vein demonstrate normal compressibility with normal color flow and spectral analysis. Limited images of the contralateral right common femoral vein are normal. IMPRESSION: 1. No evidence of DVT in the  left lower extremity. Electronically signed by: Dayne Hassell MD 06/26/2024 02:17 PM EST RP Workstation: [id number]   DG Chest 1 View Result Date: 06/25/2024 EXAM: 1 VIEW(S) XRAY OF THE CHEST 06/25/2024 06:54:00 AM COMPARISON: 06/24/2024 CLINICAL HISTORY: 64 year old female with possible pneumonia, lymphoma. FINDINGS: LINES, TUBES AND DEVICES: Right IJ Port-A-Cath stable in position. LUNGS AND PLEURA: New confluent left basilar opacities. Possible small pleural effusions. No pneumothorax. HEART AND MEDIASTINUM: No acute abnormality of the cardiac and mediastinal silhouettes. BONES AND SOFT TISSUES: Partially imaged bilateral shoulder arthroplasties. No acute osseous abnormality. IMPRESSION: 1. Progressive left lung base pneumonia. Trace if any pleural effusion(s). Electronically signed by: Helayne Hurst MD 06/25/2024 06:59 AM EST RP Workstation: [id number]   DG Chest Port 1 View Result Date: 06/24/2024 EXAM: 1 VIEW(S) XRAY OF THE CHEST 06/24/2024 07:53:41 AM COMPARISON: 06/03/2024 CLINICAL HISTORY: 64 year old female undergoing lymphoma treatment with fever, chest congestion, and questionable sepsis. FINDINGS: LINES, TUBES AND DEVICES: Right chest wall Port-A-Cath in place with tip overlying the superior caval junction. LUNGS AND PLEURA: New bilateral pulmonary interstitial opacities,  worst at the right lung base. No consolidation or pleural effusion. No pneumothorax. HEART AND MEDIASTINUM: No acute abnormality of the cardiac and mediastinal silhouettes. BONES AND SOFT TISSUES: Bilateral shoulder arthroplasties noted. No acute osseous abnormality. Negative visible bowel gas. IMPRESSION: 1. New bilateral interstitial pulmonary opacities, greatest at the right lung base. Consider viral / atypical respiratory infection. Electronically signed by: Helayne Hurst MD 06/24/2024 07:58 AM EST RP Workstation: [id number]    "

## 2024-07-22 NOTE — Progress Notes (Incomplete)
 " Hematology/Oncology Progress note [phone] [phone]      CHIEF COMPLAINTS/PURPOSE OF CONSULTATION:  DLBCL   ASSESSMENT & PLAN:   Cancer Staging  Diffuse large B-cell lymphoma (HCC) Staging form: Hodgkin and Non-Hodgkin Lymphoma, AJCC 8th Edition - Clinical stage from 04/26/2024: Stage II (Diffuse large B-cell lymphoma) - Signed by Babara Call, MD on 04/26/2024   Diffuse large B-cell lymphoma (HCC) DLBCL of left pelvic wall mass, Atypical t (8; 14), BCL2 BCL6 monosomy.  Negative for BCL2, BCL6 and MYC rearrangement PET scan evaluation showed mild hypermetabolism in spleen.  Nonspecific.  Likely stage I/II disease. Bone marrow biopsy is negative.   IPI score 1-2 Baseline Echo showed LVEF 55-60% Grade I diastolic dysfunction   10/29/205 Cycle 1 R CHOP  - hospitalized due to neutropenic fever, complicated UTI 05/31/2024 cycle 2 R -dose reduced CHP with D3 GCSF.  Hold Vincristine  due to  left foot neuropathy  Patient tolerated chemotherapy poorly and hospitalized after each cycles of chemotherapy. Repeat PET scan results reviewed and discussed with patient. She has had a partial metabolic response with residual Deauville score 4 activity of left pelvic sidewall soft tissue.  She also has poor performance status at this point. Discussed about de-escalate chemotherapy treatment to monotherapy rituximab  for another 2 cycles and repeat PET scan.  She agrees with the plan.  Can consider adding Revlimid maintenance in the future. I will also further discuss case with radiation oncology for feasibility of radiation.   Neoplasm related pain Continue Oxycodone  5mg  Q4h PRN.  Fentanyl  patch 25mcg   Neuropathy Sciatic pain/neuropathic pain.   Pre existing symptoms prior to chemotherapy. Left hip/buttock pain has improved after chemo, however worse of calf and foot pain is worse. Suspect that she has radiculopathy. Acute chemotherapy neuro toxicity is possible,  unilateral symptom favors radiculopathy.  MRI lumbar results were reviewed. Neurology recommendation was reviewed. no severe neuro-foraminal stenosis that would need to be addressed surgically. .  Discontinue Vincristine   Continue Liyrica and Cymbalta .-Patient was seen by Dr. Buckley and plan to switch care to another neurologist.  Currently she has appointment in May 2026 with possible early appointments if there is cancellation.   Pulmonary embolism (HCC) Eliquis  5mg  BID  Hydronephrosis of left kidney Patient has urinary stent placed, she will follow-up with urology for stent exchange   Orders Placed This Encounter  Procedures   CMP (Cancer Center only)    Standing Status:   Future    Expected Date:   07/21/2024    Expiration Date:   10/19/2024   CBC with Differential (Cancer Center Only)    Standing Status:   Future    Expected Date:   07/21/2024    Expiration Date:   10/19/2024   Uric acid    Standing Status:   Future    Expected Date:   07/21/2024    Expiration Date:   10/19/2024   Lactate dehydrogenase    Standing Status:   Future    Expected Date:   07/21/2024    Expiration Date:   10/19/2024   Follow up per LOS All questions were answered. The patient knows to call the clinic with any problems, questions or concerns.  Call Babara, MD, PhD [facility] Hematology Oncology 07/21/2024    HISTORY OF PRESENTING ILLNESS:  Stacey [name] 64 y.o. female presents to establish care for large B-cell lymphoma I have reviewed her chart and materials related to her cancer extensively and collaborated history with the patient. Summary of oncologic  history is as follows: Oncology History  Diffuse large B-cell lymphoma (HCC)  04/08/2024 Imaging   CT renal stone study showed  1. Mild left hydroureteronephrosis with periureteral/peripelvic stranding, no urinary calculi. 2. Imaging findings are compatible with recently passed left ureteral stone versus acute left pyelonephritis.   04/08/2024  Imaging   CT angiogram abdomen/pelvis with and without contrast  1. Obstructing 5.9 x 4.5 cm enhancing mass along the left pelvic sidewall causing left hydroureter, hydronephrosis, delayed nephrogram, and nephromegaly; mass abuts the left adnexa with loss of the intervening fat plane. This is highly concerning for malignant neoplasm. Etiology indeterminate. It's possible that this could be arising from a primary urothelial neoplasm of the left ureter. Other primary neoplasms or metastatic disease not excluded. Correlation with tissue sampling is advised. 2. Patent abdominal vasculature without signs of mesenteric ischemia.   04/24/2024 Imaging   PET scan showed  1. The posterior left pelvic sidewall mass is hypermetabolic, consistent with active lymphoma. 2. Mild splenic hypermetabolism relative to the liver. No splenomegaly. Cannot exclude lymphomatous involvement. 3. New small right pleural effusion. 4. No active lymphoma within the chest or neck. Mildly decreased sensitivity exam secondary to hypermetabolic brown fat. 5. Age advanced coronary artery atherosclerosis. Recommend assessment of coronary risk factors. 6.  Aortic Atherosclerosis    04/26/2024 Initial Diagnosis   Lymphoma (HCC)  Symptoms began on 03/10/2024 with pain radiating down the left leg, associated with the sciatic nerve. Initially managed with high doses of ibuprofen, which provided some relief, but the pain worsened within a week, leading to an emergency room visit where she received a steroid injection in her lower back.  04/08/2024 she returned to the hospital due to increased pain. A CT scan revealed a pelvic mass obstructing the ureter, and a stent was placed by a urologist.  Patient underwent biopsy of pelvis mass Pathology showed 1. Lymph node, needle/core biopsy, 18G cores infilatratic left pelvic sidewall mass :       LARGE B-CELL LYMPHOMA.  soft      tissue with atypical lymphoid infiltrate comprised  predominantly of medium to      large sized cells.  Abundant mitotic figures are present.  Focal areas of      necrosis are noted.  Immunohistochemical stains reveal the malignant cells are      positive for CD20, PAX5, BCL6 (subset), MUM1, Bcl-2 and are negative for CD10      and cyclin D1.  CD30 does not show any significant expression. CD15 highlights      neutrophils. The Ki-67 proliferation index is high approximately 70%.  This lymphoma has a activated B-cell immunophenotype.   EBER-ish is pending and will be reported in an addendum.  Also,High-grade B-cell lymphoma FISH panel will be   ordered to exclude a double/triple hit lymphoma and results will be reported in an addendum   She has experienced unintentional weight loss of 18 pounds since May, without any changes in diet or exercise. No night sweats, low-grade fever, or excessive sweating at night. Baseline health includes chronic back pain, acid reflux, prediabetes, and constipation.  She is taking gabapentin  100 mg once daily for nerve pain, described as sometimes dull and sometimes sharp. Oxycodone  5 mg every six hours for pain management, which helps reduce the pain slightly.  She notes reduced appetite, numbness, and tingling in her leg. Also mentions leg swelling, particularly at night when less active.   04/26/2024 Cancer Staging   Staging form: Hodgkin and Non-Hodgkin Lymphoma, AJCC 8th  Edition - Clinical stage from 04/26/2024: Stage II (Diffuse large B-cell lymphoma) - Signed by Babara Call, MD on 04/26/2024 Stage prefix: Initial diagnosis   05/01/2024 Bone Marrow Biopsy   Bone marrow biopsy results showed  BONE MARROW, ASPIRATE, CLOT, CORE:  - Mildly hypocellular bone marrow with otherwise orderly trilineage hematopoiesis  - No morphologic or immunophenotypic evidence of the patient's recently diagnosed large B-cell lymphoma    05/10/2024 -  Chemotherapy   Patient is on Treatment Plan : NON-HODGKINS LYMPHOMA R-CHOP q21d x 4  cycles     07/12/2024 Imaging   PET scan showed  1. Partial metabolic response (Deauville score 4) with decreased FDG uptake and decreased size of the left pelvic sidewall soft tissue (SUV max 4.7, previously 14.1), without new FDG-avid disease. 2. No other metabolically active disease identified.    Discussed the use of AI scribe software for clinical note transcription with the patient, who gave verbal consent to proceed.   She has not tolerated standard chemotherapy regimens, requiring hospitalization after each cycle for complications including pneumonia and recurrent urinary tract infections. Vincristine  was discontinued after cycle one due to reports of neuropathy and intolerance, and subsequent regimens were dose-reduced, but she continued to experience significant side effects. She remains very weak and has lost 15 pounds since early December. She denies pain in the area of the mass, which improved after initial treatment. She is able to eat and drink, though appetite is variable and she sometimes experiences nausea, with vomiting the morning of the visit. She uses protein drinks infrequently and is scheduled to see a nutritionist.  She has an indwelling ureteral stent. The stent is scheduled for exchange to a longer-term device. She experiences recurrent urinary tract infections, particularly after chemotherapy, attributed to the stent and immunosuppression. She is on chronic suppressive nitrofurantoin  therapy and uses probiotics. She denies current fevers, chills, sweats, diarrhea, hematuria, or dysuria.  She continues to experience persistent lower extremity tingling, swelling, and pain, attributed to chemotherapy-induced peripheral neuropathy and pre-existing sciatic nerve issues. Lyrica  has provided limited benefit. She is under the care of an orthopedic physician and is scheduled for piriformis muscle and nerve root injections. Neurology follow-up for nerve conduction studies is  scheduled for May. She is managed with oxycodone  and a fentanyl  patch for pain, which she finds helpful. She also takes trazodone  for sleep and omeprazole  for acid reflux, which is controlled. She has noticed her port has become hard and more prominent, possibly due to weight loss.    MEDICAL HISTORY:  Past Medical History:  Diagnosis Date   Arthritis    left shoulder, neck, lower back  (01/11/2018)   Cancer (HCC)    lymphoma, on chemo   Chronic lower back pain    Family history of adverse reaction to anesthesia    Mother has nausea   GERD (gastroesophageal reflux disease)    Neuropathy    L leg   PONV (postoperative nausea and vomiting)    Pre-diabetes    Swelling 2025   left calf, left foot- Sept-Oct 2025   UTI (urinary tract infection) 03/20/2021    SURGICAL HISTORY: Past Surgical History:  Procedure Laterality Date   APPENDECTOMY     CYSTOSCOPY W/ URETERAL STENT PLACEMENT Left 04/08/2024   Procedure: CYSTOSCOPY, WITH RETROGRADE PYELOGRAM AND URETERAL STENT INSERTION;  Surgeon: Watt Rush, MD;  Location: [facility] ORS;  Service: Urology;  Laterality: Left;   IR IMAGING GUIDED PORT INSERTION  05/01/2024   JOINT REPLACEMENT  TOTAL SHOULDER ARTHROPLASTY Left 01/11/2018   TOTAL SHOULDER ARTHROPLASTY Left 01/11/2018   Procedure: LEFT TOTAL SHOULDER ARTHROPLASTY;  Surgeon: Sharl Selinda Dover, MD;  Location: [facility] OR;  Service: Orthopedics;  Laterality: Left;  2.5 hrs   TOTAL SHOULDER ARTHROPLASTY Right 02/08/2020   Procedure: TOTAL SHOULDER ARTHROPLASTY;  Surgeon: Sharl Selinda Dover, MD;  Location: [facility] OR;  Service: Orthopedics;  Laterality: Right;  2.5 hrs RNFA    SOCIAL HISTORY: Social History   Socioeconomic History   Marital status: Married    Spouse name: Dereonna Lensing   Number of children: 0   Years of education: Not on file   Highest education level: 12th grade  Occupational History   Not on file  Tobacco Use   Smoking status: Never    Smokeless tobacco: Never  Vaping Use   Vaping status: Never Used  Substance and Sexual Activity   Alcohol use: Not Currently    Comment: 01/11/2018 might have 1 drink/month   Drug use: Never   Sexual activity: Yes  Other Topics Concern   Not on file  Social History Narrative   Lives at home with husband   Social Drivers of Health   Tobacco Use: Low Risk (07/21/2024)   Patient History    Smoking Tobacco Use: Never    Smokeless Tobacco Use: Never    Passive Exposure: Not on file  Financial Resource Strain: Low Risk  (03/30/2024)   Received from [facility]   Overall Financial Resource Strain (CARDIA)    Difficulty of Paying Living Expenses: Not hard at all  Food Insecurity: No Food Insecurity (06/24/2024)   Epic    Worried About Radiation Protection Practitioner of Food in the Last Year: Never true    Ran Out of Food in the Last Year: Never true  Transportation Needs: No Transportation Needs (06/24/2024)   Epic    Lack of Transportation (Medical): No    Lack of Transportation (Non-Medical): No  Physical Activity: Not on file  Stress: Not on file  Social Connections: Socially Integrated (05/17/2024)   Social Connection and Isolation Panel    Frequency of Communication with Friends and Family: Three times a week    Frequency of Social Gatherings with Friends and Family: Three times a week    Attends Religious Services: 1 to 4 times per year    Active Member of Clubs or Organizations: Yes    Attends Banker Meetings: 1 to 4 times per year    Marital Status: Married  Catering Manager Violence: Not At Risk (06/24/2024)   Epic    Fear of Current or Ex-Partner: No    Emotionally Abused: No    Physically Abused: No    Sexually Abused: No  Depression (PHQ2-9): Low Risk (07/21/2024)   Depression (PHQ2-9)    PHQ-2 Score: 0  Alcohol Screen: Not on file  Housing: Low Risk (06/24/2024)   Epic    Unable to Pay for Housing in the Last Year: No     Number of Times Moved in the Last Year: 0    Homeless in the Last Year: No  Utilities: Not At Risk (06/24/2024)   Epic    Threatened with loss of utilities: No  Health Literacy: Not on file    FAMILY HISTORY: Family History  Problem Relation Age of Onset   Hypertension Mother    Heart disease Father    Heart attack Father    CAD Father    Colon cancer Neg Hx  Esophageal cancer Neg Hx    Rectal cancer Neg Hx    Stomach cancer Neg Hx     ALLERGIES:  is allergic to propofol .  MEDICATIONS:  Current Outpatient Medications  Medication Sig Dispense Refill   acyclovir  (ZOVIRAX ) 400 MG tablet Take 1 tablet (400 mg total) by mouth daily. 30 tablet 3   allopurinol  (ZYLOPRIM ) 300 MG tablet Take 1 tablet (300 mg total) by mouth daily. 30 tablet 3   apixaban  (ELIQUIS ) 5 MG TABS tablet Take 2 tablets (10 mg total) by mouth 2 (two) times daily for 3 days, THEN 1 tablet (5 mg total) 2 (two) times daily. 60 tablet 2   doxycycline  (VIBRAMYCIN ) 100 MG capsule Take 1 capsule (100 mg total) by mouth every 12 (twelve) hours. 14 capsule 0   DULoxetine  (CYMBALTA ) 30 MG capsule Take 1 capsule (30 mg total) by mouth daily. 30 capsule 3   fentaNYL  (DURAGESIC ) 25 MCG/HR Place 1 patch onto the skin every 3 (three) days. 5 patch 0   loratadine  (CLARITIN ) 10 MG tablet Take 10 mg by mouth daily.     methocarbamol  (ROBAXIN ) 500 MG tablet Take 500 mg by mouth every 8 (eight) hours as needed for muscle spasms.  0   nitrofurantoin , macrocrystal-monohydrate, (MACROBID ) 100 MG capsule Take 1 capsule (100 mg total) by mouth daily. 90 capsule 1   omeprazole  (PRILOSEC) 20 MG capsule TAKE 1 CAPSULE (20 MG TOTAL) BY MOUTH 2 (TWO) TIMES DAILY BEFORE A MEAL. (Patient taking differently: Take 20 mg by mouth daily as needed (acid reflux).) 180 capsule 0   ondansetron  (ZOFRAN ) 8 MG tablet Take 1 tablet (8 mg total) by mouth every 8 (eight) hours as needed for nausea or vomiting. Start on the third day  after cyclophosphamide  chemotherapy. 30 tablet 1   oxybutynin  (DITROPAN -XL) 10 MG 24 hr tablet Take 1 tablet (10 mg total) by mouth daily. 30 tablet 1   Oxycodone  HCl 10 MG TABS Take 1 tablet (10 mg total) by mouth every 4 (four) hours as needed (pain). 60 tablet 0   polyethylene glycol powder (GLYCOLAX /MIRALAX ) 17 GM/SCOOP powder Take 17 g by mouth daily as needed. Dissolve 1 capful (17g) in 4-8 ounces of liquid and take by mouth daily. 238 g 1   pregabalin  (LYRICA ) 75 MG capsule Take 1 capsule (75 mg total) by mouth 2 (two) times daily. 60 capsule 2   prochlorperazine  (COMPAZINE ) 10 MG tablet Take 1 tablet (10 mg total) by mouth every 6 (six) hours as needed for nausea or vomiting. 30 tablet 6   tamsulosin  (FLOMAX ) 0.4 MG CAPS capsule Take 1 capsule (0.4 mg total) by mouth daily. 30 capsule 1   traZODone  (DESYREL ) 50 MG tablet Take 1 tablet (50 mg total) by mouth at bedtime as needed for sleep. 30 tablet 3   magic mouthwash (multi-ingredient) oral suspension Swish and swallow 5-10 mLs 4 (four) times daily as needed for mouth pain. (Patient not taking: Reported on 07/21/2024) 480 mL 1   No current facility-administered medications for this visit.    Review of Systems  Constitutional:  Positive for appetite change, fatigue and unexpected weight change. Negative for chills and fever.  HENT:   Negative for hearing loss and voice change.   Eyes:  Negative for eye problems.  Respiratory:  Negative for chest tightness and cough.   Cardiovascular:  Negative for chest pain.  Gastrointestinal:  Negative for abdominal distention, abdominal pain and blood in stool.  Endocrine: Negative for hot flashes.  Genitourinary:  Negative for difficulty urinating and frequency.   Musculoskeletal:  Positive for back pain. Negative for arthralgias.  Skin:  Negative for itching and rash.  Neurological:  Positive for numbness. Negative for extremity weakness.       Left buttock pain radiate down to calf and  foot.   Hematological:  Negative for adenopathy.  Psychiatric/Behavioral:  Negative for confusion.      PHYSICAL EXAMINATION: ECOG PERFORMANCE STATUS: 2 - Symptomatic, <50% confined to bed  Vitals:   07/21/24 1109  BP: 128/83  Pulse: 83  Temp: 98 F (36.7 C)  SpO2: 98%   Filed Weights   07/21/24 1109  Weight: 155 lb (70.3 kg)    Physical Exam Constitutional:      General: She is not in acute distress.    Appearance: She is not diaphoretic.  HENT:     Head: Normocephalic and atraumatic.  Eyes:     General: No scleral icterus. Cardiovascular:     Rate and Rhythm: Normal rate and regular rhythm.  Pulmonary:     Effort: Pulmonary effort is normal. No respiratory distress.     Breath sounds: Normal breath sounds. No wheezing.  Abdominal:     General: There is no distension.     Palpations: Abdomen is soft.     Tenderness: There is no abdominal tenderness.  Musculoskeletal:        General: Normal range of motion.     Cervical back: Normal range of motion and neck supple.  Skin:    General: Skin is warm and dry.     Findings: No erythema.  Neurological:     Mental Status: She is alert and oriented to person, place, and time. Mental status is at baseline.     Motor: No abnormal muscle tone.  Psychiatric:        Mood and Affect: Affect normal.      LABORATORY DATA:  I have reviewed the data as listed    Latest Ref Rng & Units 06/28/2024    6:17 AM 06/27/2024    5:49 AM 06/26/2024    5:19 AM  CBC  WBC 4.0 - 10.5 K/uL 6.2  7.0  8.3   Hemoglobin 12.0 - 15.0 g/dL 8.9  9.1  9.7   Hematocrit 36.0 - 46.0 % 28.0  28.6  29.9   Platelets 150 - 400 K/uL 203  184  198       Latest Ref Rng & Units 06/28/2024    6:17 AM 06/27/2024    5:49 AM 06/26/2024    5:19 AM  CMP  Glucose 70 - 99 mg/dL 899  885  885   BUN 8 - 23 mg/dL 6  7  8    Creatinine 0.44 - 1.00 mg/dL 9.57  9.57  9.42   Sodium 135 - 145 mmol/L 140  139  143   Potassium 3.5 - 5.1 mmol/L 3.8  3.6  4.2    Chloride 98 - 111 mmol/L 104  102  106   CO2 22 - 32 mmol/L 29  28  30    Calcium 8.9 - 10.3 mg/dL 8.8  8.7  9.1      RADIOGRAPHIC STUDIES: I have personally reviewed the radiological images as listed and agreed with the findings in the report. NM PET Image Restag (PS) Skull Base To Thigh Result Date: 07/12/2024 EXAM: PET AND CT SKULL BASE TO MID THIGH 07/11/2024 10:41:49 AM TECHNIQUE: RADIOPHARMACEUTICAL: 9.22 mCi F-18 FDG Uptake time 60 minutes. Glucose level 119 mg/dl. Blood  pool SUV max equals 2.5. Liver SUV max equals 3.4. PET imaging was acquired from the base of the skull to the mid thighs. Non-contrast enhanced computed tomography was obtained for attenuation correction and anatomic localization. COMPARISON: 04/24/2024 CLINICAL HISTORY: lymphoma FINDINGS: HEAD AND NECK: No tracer avid lymph nodes within the soft tissues of the neck. CHEST: No tracer avid mediastinal, hilar, or axillary lymph nodes. Small bilateral pleural effusions, left greater than right. No tracer avid pulmonary nodule or mass identified. Coronary artery calcifications. ABDOMEN AND PELVIS: No abnormal tracer activity identified within the liver. No abnormal splenic hypermetabolism relative to liver uptake. Normal appearance of the adrenal glands. No tracer avid lymph nodes identified within the abdomen. Asymmetric increased soft tissue along the left pelvic sidewall is again noted. This currently measures 3.5 x 2.1 cm. The SUV max within this area is equal to 4.7, axial image 128. On the previous exam, this measured 3.7 x 3.3 cm with an SUV max of 14.1. A left-sided nephroureteral stent is in place. No hydronephrosis identified. Physiologic activity within the gastrointestinal and genitourinary systems. BONES AND SOFT TISSUE: No abnormal FDG activity localizes to the bones. No metabolically active aggressive osseous lesion. IMPRESSION: 1. Partial metabolic response (Deauville score 4) with decreased FDG uptake and decreased  size of the left pelvic sidewall soft tissue (SUV max 4.7, previously 14.1), without new FDG-avid disease. 2. No other metabolically active disease identified. Electronically signed by: Waddell Calk MD 07/12/2024 05:59 AM EST RP Workstation: [id number]   MR TIBIA FIBULA LEFT W WO CONTRAST Result Date: 06/27/2024 CLINICAL DATA:  Progressive pain and swelling for 3 months EXAM: MRI OF LOWER LEFT EXTREMITY WITHOUT AND WITH CONTRAST TECHNIQUE: Multiplanar, multisequence MR imaging of the left lower leg was performed both before and after administration of intravenous contrast. CONTRAST:  7.5mL GADAVIST  GADOBUTROL  1 MMOL/ML IV SOLN COMPARISON:  None Available. FINDINGS: Bones/Joint/Cartilage No acute fracture or dislocation. Normal alignment. No joint effusion. Small chronic bone infarcts in the proximal tibial metaphysis bilaterally. High-grade partial-thickness cartilage loss of the weight-bearing right medial femorotibial compartment. Moderate partial-thickness cartilage loss of the weight-bearing left medial femorotibial compartment. Small Baker's cyst. Ligaments Collateral ligaments are intact. Muscles and Tendons Moderate tendinosis of the peroneus longus. Flexor, extensor, and Achilles tendons are intact. Visualized patellar tendon is intact. Mild muscle edema in the proximal anterior and peroneal compartment muscles. Mild muscle edema in the posteromedial aspect of the soleus muscle and inferior aspect of the medial gastrocnemius muscle. No significant muscle atrophy. Soft tissue No fluid collection or hematoma.  No soft tissue mass. IMPRESSION: 1. No acute osseous injury of the left lower leg. 2. Moderate tendinosis of the peroneus longus. 3. Mild muscle edema in the proximal anterior and peroneal compartment muscles. Mild muscle edema in the posteromedial aspect of the soleus muscle and inferior aspect of the medial gastrocnemius muscle. Findings are nonspecific and may be secondary to muscle strain versus  myositis. 4. High-grade partial-thickness cartilage loss of the weight-bearing right medial femorotibial compartment. 5. Moderate partial-thickness cartilage loss of the weight-bearing left medial femorotibial compartment. Electronically Signed   By: Julaine Blanch M.D.   On: 06/27/2024 12:21   US  Venous Img Lower Unilateral Left (DVT) Result Date: 06/26/2024 EXAM: ULTRASOUND DUPLEX OF THE LEFT LOWER EXTREMITY VEINS TECHNIQUE: Duplex ultrasound using B-mode/gray scaled imaging and Doppler spectral analysis and color flow was obtained of the deep venous structures of the left lower extremity. COMPARISON: 05/19/2024 CLINICAL HISTORY: increased swelling, and pain of LLE FINDINGS: The common  femoral vein, femoral vein, popliteal vein, and posterior tibial vein demonstrate normal compressibility with normal color flow and spectral analysis. Limited images of the contralateral right common femoral vein are normal. IMPRESSION: 1. No evidence of DVT in the left lower extremity. Electronically signed by: Dayne Hassell MD 06/26/2024 02:17 PM EST RP Workstation: [id number]   DG Chest 1 View Result Date: 06/25/2024 EXAM: 1 VIEW(S) XRAY OF THE CHEST 06/25/2024 06:54:00 AM COMPARISON: 06/24/2024 CLINICAL HISTORY: 64 year old female with possible pneumonia, lymphoma. FINDINGS: LINES, TUBES AND DEVICES: Right IJ Port-A-Cath stable in position. LUNGS AND PLEURA: New confluent left basilar opacities. Possible small pleural effusions. No pneumothorax. HEART AND MEDIASTINUM: No acute abnormality of the cardiac and mediastinal silhouettes. BONES AND SOFT TISSUES: Partially imaged bilateral shoulder arthroplasties. No acute osseous abnormality. IMPRESSION: 1. Progressive left lung base pneumonia. Trace if any pleural effusion(s). Electronically signed by: Helayne Hurst MD 06/25/2024 06:59 AM EST RP Workstation: [id number]   DG Chest Port 1 View Result Date: 06/24/2024 EXAM: 1 VIEW(S) XRAY OF THE CHEST 06/24/2024 07:53:41 AM  COMPARISON: 06/03/2024 CLINICAL HISTORY: 64 year old female undergoing lymphoma treatment with fever, chest congestion, and questionable sepsis. FINDINGS: LINES, TUBES AND DEVICES: Right chest wall Port-A-Cath in place with tip overlying the superior caval junction. LUNGS AND PLEURA: New bilateral pulmonary interstitial opacities, worst at the right lung base. No consolidation or pleural effusion. No pneumothorax. HEART AND MEDIASTINUM: No acute abnormality of the cardiac and mediastinal silhouettes. BONES AND SOFT TISSUES: Bilateral shoulder arthroplasties noted. No acute osseous abnormality. Negative visible bowel gas. IMPRESSION: 1. New bilateral interstitial pulmonary opacities, greatest at the right lung base. Consider viral / atypical respiratory infection. Electronically signed by: Helayne Hurst MD 06/24/2024 07:58 AM EST RP Workstation: [id number]    "

## 2024-07-22 NOTE — Progress Notes (Signed)
 DISCONTINUE ON PATHWAY REGIMEN - Lymphoma and CLL     A cycle is every 21 days:     Prednisone       Rituximab -xxxx      Cyclophosphamide       Doxorubicin       Vincristine    **Always confirm dose/schedule in your pharmacy ordering system**  PRIOR TREATMENT: [id number]: R(IV)-CHOP q21 Days x 4 Cycles  START OFF PATHWAY REGIMEN - Lymphoma and CLL   [id number]:Rituximab  375 mg/m2 IV D1 q7 Days:   A cycle is every 7 days:     Rituximab -xxxx   **Always confirm dose/schedule in your pharmacy ordering system**  Patient Characteristics: Diffuse Large B-Cell Lymphoma or Follicular Lymphoma, Grade 3B, First Line, Stage I and II, No Bulk Disease Type: Not Applicable Disease Type: Not Applicable Disease Type: Diffuse Large B-Cell Lymphoma Line of therapy: First Line Disease Characteristics: No Bulk Intent of Therapy: Curative Intent, Discussed with Patient

## 2024-07-24 ENCOUNTER — Ambulatory Visit: Admitting: Certified Registered"

## 2024-07-24 ENCOUNTER — Telehealth: Payer: Self-pay | Admitting: Oncology

## 2024-07-24 ENCOUNTER — Ambulatory Visit: Admission: RE | Admit: 2024-07-24 | Discharge: 2024-07-24 | Disposition: A | Attending: Urology | Admitting: Urology

## 2024-07-24 ENCOUNTER — Encounter: Payer: Self-pay | Admitting: Urology

## 2024-07-24 ENCOUNTER — Other Ambulatory Visit: Payer: Self-pay

## 2024-07-24 ENCOUNTER — Other Ambulatory Visit: Payer: Self-pay | Admitting: Oncology

## 2024-07-24 ENCOUNTER — Encounter
Admission: RE | Disposition: A | Discharge status: Home / Self Care | Payer: Self-pay | Source: Home / Self Care | Attending: Urology

## 2024-07-24 ENCOUNTER — Encounter: Payer: Self-pay | Admitting: Oncology

## 2024-07-24 ENCOUNTER — Ambulatory Visit

## 2024-07-24 DIAGNOSIS — N1330 Unspecified hydronephrosis: Secondary | ICD-10-CM | POA: Diagnosis not present

## 2024-07-24 DIAGNOSIS — K219 Gastro-esophageal reflux disease without esophagitis: Secondary | ICD-10-CM | POA: Diagnosis not present

## 2024-07-24 DIAGNOSIS — Z87440 Personal history of urinary (tract) infections: Secondary | ICD-10-CM | POA: Insufficient documentation

## 2024-07-24 DIAGNOSIS — C8510 Unspecified B-cell lymphoma, unspecified site: Secondary | ICD-10-CM | POA: Insufficient documentation

## 2024-07-24 DIAGNOSIS — Z466 Encounter for fitting and adjustment of urinary device: Secondary | ICD-10-CM | POA: Insufficient documentation

## 2024-07-24 HISTORY — PX: CYSTOSCOPY W/ URETERAL STENT PLACEMENT: SHX1429

## 2024-07-24 HISTORY — PX: CYSTOSCOPY W/ RETROGRADES: SHX1426

## 2024-07-24 SURGERY — CYSTOSCOPY, FLEXIBLE, WITH STENT REPLACEMENT
Anesthesia: General | Laterality: Left

## 2024-07-24 MED ORDER — FENTANYL CITRATE (PF) 100 MCG/2ML IJ SOLN: 25.0000 ug | INTRAMUSCULAR | Status: DC | PRN

## 2024-07-24 MED ORDER — DEXAMETHASONE SOD PHOSPHATE PF 10 MG/ML IJ SOLN
INTRAMUSCULAR | Status: DC | PRN
  Administered 2024-07-24: 10 mg via INTRAVENOUS

## 2024-07-24 MED ORDER — MIDAZOLAM HCL 2 MG/2ML IJ SOLN
INTRAMUSCULAR | Status: AC
  Filled 2024-07-24: qty 2

## 2024-07-24 MED ORDER — FENTANYL CITRATE (PF) 100 MCG/2ML IJ SOLN
INTRAMUSCULAR | Status: DC | PRN
  Administered 2024-07-24 (×2): 25 ug via INTRAVENOUS

## 2024-07-24 MED ORDER — CHLORHEXIDINE GLUCONATE 0.12 % MT SOLN
15.0000 mL | Freq: Once | OROMUCOSAL | Status: AC
  Administered 2024-07-24: 15 mL via OROMUCOSAL

## 2024-07-24 MED ORDER — IOHEXOL 180 MG/ML  SOLN
INTRAMUSCULAR | Status: DC | PRN
  Administered 2024-07-24: 20 mL

## 2024-07-24 MED ORDER — SODIUM CHLORIDE 0.9 % IR SOLN
Status: DC | PRN
  Administered 2024-07-24: 1

## 2024-07-24 MED ORDER — PHENYLEPHRINE 80 MCG/ML (10ML) SYRINGE FOR IV PUSH (FOR BLOOD PRESSURE SUPPORT)
PREFILLED_SYRINGE | INTRAVENOUS | Status: DC | PRN
  Administered 2024-07-24: 160 ug via INTRAVENOUS

## 2024-07-24 MED ORDER — PROPOFOL 10 MG/ML IV BOLUS
INTRAVENOUS | Status: DC | PRN
  Administered 2024-07-24: 180 mg via INTRAVENOUS

## 2024-07-24 MED ORDER — GLYCOPYRROLATE 0.2 MG/ML IJ SOLN
INTRAMUSCULAR | Status: DC | PRN
  Administered 2024-07-24: .2 mg via INTRAVENOUS

## 2024-07-24 MED ORDER — DEXMEDETOMIDINE HCL IN NACL 200 MCG/50ML IV SOLN
INTRAVENOUS | Status: DC | PRN
  Administered 2024-07-24: 12 ug via INTRAVENOUS

## 2024-07-24 MED ORDER — ORAL CARE MOUTH RINSE: 15.0000 mL | Freq: Once | OROMUCOSAL | Status: AC

## 2024-07-24 MED ORDER — EPHEDRINE SULFATE-NACL 50-0.9 MG/10ML-% IV SOSY
PREFILLED_SYRINGE | INTRAVENOUS | Status: DC | PRN
  Administered 2024-07-24: 5 mg via INTRAVENOUS

## 2024-07-24 MED ORDER — NITROFURANTOIN MONOHYD MACRO 100 MG PO CAPS
100.0000 mg | ORAL_CAPSULE | Freq: Every day | ORAL | 3 refills | Status: AC
  Filled 2024-07-24: qty 30, 30d supply, fill #0
  Filled 2024-08-13: qty 90, 90d supply, fill #0
  Filled 2024-08-15: qty 30, 30d supply, fill #0

## 2024-07-24 MED ORDER — CHLORHEXIDINE GLUCONATE 0.12 % MT SOLN
OROMUCOSAL | Status: AC
  Filled 2024-07-24: qty 15

## 2024-07-24 MED ORDER — DROPERIDOL 2.5 MG/ML IJ SOLN: 0.6250 mg | Freq: Once | INTRAMUSCULAR | Status: DC | PRN

## 2024-07-24 MED ORDER — LIDOCAINE HCL (CARDIAC) PF 100 MG/5ML IV SOSY
PREFILLED_SYRINGE | INTRAVENOUS | Status: DC | PRN
  Administered 2024-07-24: 100 mg via INTRAVENOUS

## 2024-07-24 MED ORDER — ACETAMINOPHEN 10 MG/ML IV SOLN
INTRAVENOUS | Status: DC | PRN
  Administered 2024-07-24: 1000 mg via INTRAVENOUS

## 2024-07-24 MED ORDER — CEFAZOLIN SODIUM-DEXTROSE 2-4 GM/100ML-% IV SOLN
INTRAVENOUS | Status: AC
  Filled 2024-07-24: qty 100

## 2024-07-24 MED ORDER — ONDANSETRON HCL 4 MG/2ML IJ SOLN
INTRAMUSCULAR | Status: DC | PRN
  Administered 2024-07-24 (×2): 4 mg via INTRAVENOUS

## 2024-07-24 MED ORDER — OXYCODONE HCL 5 MG PO TABS: 5.0000 mg | ORAL_TABLET | Freq: Once | ORAL | Status: DC | PRN

## 2024-07-24 MED ORDER — ACETAMINOPHEN 10 MG/ML IV SOLN: 1000.0000 mg | Freq: Once | INTRAVENOUS | Status: DC | PRN

## 2024-07-24 MED ORDER — FENTANYL CITRATE (PF) 100 MCG/2ML IJ SOLN
INTRAMUSCULAR | Status: AC
  Filled 2024-07-24: qty 2

## 2024-07-24 MED ORDER — OXYCODONE HCL 5 MG/5ML PO SOLN: 5.0000 mg | Freq: Once | ORAL | Status: DC | PRN

## 2024-07-24 MED ORDER — LACTATED RINGERS IV SOLN: INTRAVENOUS | Status: DC

## 2024-07-24 MED ORDER — CEFAZOLIN SODIUM-DEXTROSE 2-4 GM/100ML-% IV SOLN
2.0000 g | INTRAVENOUS | Status: AC
  Administered 2024-07-24: 2 g via INTRAVENOUS

## 2024-07-24 SURGICAL SUPPLY — 16 items
BAG DRAIN SIEMENS DORNER NS (MISCELLANEOUS) ×1 IMPLANT
BRUSH SCRUB EZ 4% CHG (MISCELLANEOUS) ×1 IMPLANT
CATH URETL OPEN 5X70 (CATHETERS) ×1 IMPLANT
DRAPE UTILITY 15X26 TOWEL STRL (DRAPES) ×1 IMPLANT
GLOVE BIOGEL PI IND STRL 7.5 (GLOVE) ×1 IMPLANT
GOWN STRL REUS W/ TWL LRG LVL3 (GOWN DISPOSABLE) ×1 IMPLANT
GOWN STRL REUS W/ TWL XL LVL3 (GOWN DISPOSABLE) ×1 IMPLANT
GUIDEWIRE STR DUAL SENSOR (WIRE) ×1 IMPLANT
KIT TURNOVER CYSTO (KITS) ×1 IMPLANT
PACK CYSTO AR (MISCELLANEOUS) ×1 IMPLANT
SET CYSTO IRRIGATION (SET/KITS/TRAYS/PACK) ×1 IMPLANT
SOL .9 NS 3000ML IRR UROMATIC (IV SOLUTION) ×1 IMPLANT
SOLN STERILE WATER 500 ML (IV SOLUTION) ×1 IMPLANT
SOLN STERILE WATER BTL 1000 ML (IV SOLUTION) ×1 IMPLANT
STENT URO INLAY 6FRX24CM (STENTS) IMPLANT
SURGILUBE 2OZ TUBE FLIPTOP (MISCELLANEOUS) ×1 IMPLANT

## 2024-07-24 NOTE — H&P (Signed)
 "  07/24/2024 11:57 AM   Stacey [name] 1961/07/03 [id number]  CC: Left hydronephrosis  HPI: 64 year old female with left pelvic mass, diagnosed as B-cell lymphoma, initially presented with left hydronephrosis, left renal colic, acute kidney injury and underwent left ureteral stent placement.  Here today for possible stent removal versus stent change pending retrograde results.  Recent PET scan shows 50% decrease in size of mass.  Overall has tolerated the stent well aside from a few UTIs, those have since resolved after starting low-dose nitrofurantoin  prophylaxis.   PMH: Past Medical History:  Diagnosis Date   Arthritis    left shoulder, neck, lower back  (01/11/2018)   Cancer (HCC)    lymphoma, on chemo   Chronic lower back pain    Family history of adverse reaction to anesthesia    Mother has nausea   GERD (gastroesophageal reflux disease)    Neuropathy    L leg   PONV (postoperative nausea and vomiting)    Pre-diabetes    Swelling 2025   left calf, left foot- Sept-Oct 2025   UTI (urinary tract infection) 03/20/2021    Surgical History: Past Surgical History:  Procedure Laterality Date   APPENDECTOMY     CYSTOSCOPY W/ URETERAL STENT PLACEMENT Left 04/08/2024   Procedure: CYSTOSCOPY, WITH RETROGRADE PYELOGRAM AND URETERAL STENT INSERTION;  Surgeon: Watt Rush, MD;  Location: [facility] ORS;  Service: Urology;  Laterality: Left;   IR IMAGING GUIDED PORT INSERTION  05/01/2024   JOINT REPLACEMENT     TOTAL SHOULDER ARTHROPLASTY Left 01/11/2018   TOTAL SHOULDER ARTHROPLASTY Left 01/11/2018   Procedure: LEFT TOTAL SHOULDER ARTHROPLASTY;  Surgeon: Sharl Selinda Dover, MD;  Location: [facility] OR;  Service: Orthopedics;  Laterality: Left;  2.5 hrs   TOTAL SHOULDER ARTHROPLASTY Right 02/08/2020   Procedure: TOTAL SHOULDER ARTHROPLASTY;  Surgeon: Sharl Selinda Dover, MD;  Location: [facility] OR;  Service: Orthopedics;  Laterality: Right;  2.5 hrs RNFA     Family History: Family  History  Problem Relation Age of Onset   Hypertension Mother    Heart disease Father    Heart attack Father    CAD Father    Colon cancer Neg Hx    Esophageal cancer Neg Hx    Rectal cancer Neg Hx    Stomach cancer Neg Hx     Social History:  reports that she has never smoked. She has never used smokeless tobacco. She reports that she does not currently use alcohol. She reports that she does not use drugs.  Physical Exam: BP 135/83   Pulse (!) 102   Temp 98.2 F (36.8 C) (Temporal)   Resp 18   SpO2 100%    Constitutional:  Alert and oriented, No acute distress. Cardiovascular: Regular rate and rhythm Respiratory: Clear to auscultation bilaterally GI: Abdomen is soft, nontender, nondistended, no abdominal masses   Laboratory Data: Preop urine culture 07/12/2024 10-25k Corynebacterium, treated with antibiotics   Assessment & Plan:   64 year old female with left pelvic mass, diagnosed as B-cell lymphoma, initially presented with left hydronephrosis, left renal colic, acute kidney injury and underwent left ureteral stent placement.  Here today for possible stent removal versus stent change pending retrograde results.  Recent PET scan shows 50% decrease in size of mass.  Overall has tolerated the stent well aside from a few UTIs, those have since resolved after starting low-dose nitrofurantoin  prophylaxis.  Cystoscopy, left retrograde pyelogram, possible left stent replacement   Redell Burnet, MD 07/24/2024  [facility] [address]  [address], [address] [place], [place] [address] [phone]   "

## 2024-07-24 NOTE — Progress Notes (Signed)
 Verified with Dr. Francisca that patient is to restart her Eliquis  tomorrow, added to patient discharge AVS and notified patient and spouse.

## 2024-07-24 NOTE — Anesthesia Preprocedure Evaluation (Signed)
"                                    Anesthesia Evaluation  Patient identified by MRN, date of birth, ID band Patient awake    Reviewed: Allergy & Precautions, H&P , NPO status , Patient's Chart, lab work & pertinent test results, reviewed documented beta blocker date and time   History of Anesthesia Complications (+) PONV, Family history of anesthesia reaction and history of anesthetic complications  Airway Mallampati: II  TM Distance: >3 FB Neck ROM: full    Dental  (+) Teeth Intact   Pulmonary pneumonia   Pulmonary exam normal        Cardiovascular Exercise Tolerance: Good negative cardio ROS Normal cardiovascular exam Rate:Normal     Neuro/Psych negative neurological ROS  negative psych ROS   GI/Hepatic Neg liver ROS,GERD  Medicated,,  Endo/Other  negative endocrine ROS    Renal/GU Renal disease  negative genitourinary   Musculoskeletal   Abdominal   Peds  Hematology negative hematology ROS (+)   Anesthesia Other Findings   Reproductive/Obstetrics (+) Breast feeding                               Anesthesia Physical Anesthesia Plan  ASA: 2  Anesthesia Plan: General LMA   Post-op Pain Management:    Induction:   PONV Risk Score and Plan:   Airway Management Planned:   Additional Equipment:   Intra-op Plan:   Post-operative Plan:   Informed Consent: I have reviewed the patients History and Physical, chart, labs and discussed the procedure including the risks, benefits and alternatives for the proposed anesthesia with the patient or authorized representative who has indicated his/her understanding and acceptance.       Plan Discussed with: CRNA  Anesthesia Plan Comments:         Anesthesia Quick Evaluation  "

## 2024-07-24 NOTE — Transfer of Care (Signed)
 Immediate Anesthesia Transfer of Care Note  Patient: Stacey [name]  Procedure(s) Performed: PHYLLIS SIDE, WITH STENT REPLACEMENT (Left) CYSTOSCOPY, WITH RETROGRADE PYELOGRAM (Left)  Patient Location: PACU  Anesthesia Type:General  Level of Consciousness: drowsy and patient cooperative  Airway & Oxygen Therapy: Patient Spontanous Breathing and Patient connected to face mask oxygen  Post-op Assessment: Report given to RN and Post -op Vital signs reviewed and stable  Post vital signs: Reviewed and stable  Last Vitals:  Vitals Value Taken Time  BP 112/58 07/24/24 12:36  Temp 36.6 C 07/24/24 12:36  Pulse 79 07/24/24 12:38  Resp 12 07/24/24 12:38  SpO2 100 % 07/24/24 12:38  Vitals shown include unfiled device data.  Last Pain:  Vitals:   07/24/24 1008  TempSrc: Temporal  PainSc: 10-Worst pain ever         Complications: No notable events documented.

## 2024-07-24 NOTE — Anesthesia Procedure Notes (Signed)
 Procedure Name: LMA Insertion Date/Time: 07/24/2024 12:12 PM  Performed by: Ledora Duncan, CRNAPre-anesthesia Checklist: Patient identified, Emergency Drugs available, Suction available and Patient being monitored Patient Re-evaluated:Patient Re-evaluated prior to induction Oxygen Delivery Method: Circle system utilized Preoxygenation: Pre-oxygenation with 100% oxygen Induction Type: IV induction Ventilation: Mask ventilation without difficulty LMA: LMA inserted LMA Size: 3.0 Tube type: Oral Number of attempts: 1 Placement Confirmation: ETT inserted through vocal cords under direct vision, positive ETCO2 and breath sounds checked- equal and bilateral Tube secured with: Tape Dental Injury: Teeth and Oropharynx as per pre-operative assessment

## 2024-07-24 NOTE — Op Note (Signed)
 Date of procedure: 07/24/2024  Preoperative diagnosis:  Left hydronephrosis  Postoperative diagnosis:  Same  Procedure: Cystoscopy Left retrograde pyelogram with intraoperative interpretation Left ureteral stent replacement  Surgeon: Redell Burnet, MD  Anesthesia: General  Complications: None  Intraoperative findings:  Normal bladder, ureteral orifices orthotopic bilaterally No definite abnormalities on retrograde, but contrast did not fully drained from left kidney and stent replaced  EBL: None  Specimens: None  Drains: Left 6 French by 24 cm Bard Optima stent  Indication: Stacey [name] is a 64 y.o. patient with left pelvic mass found to be B-cell lymphoma and currently undergoing treatment with oncology, previously stented for left hydronephrosis and acute kidney injury.  Here today for retrograde pyelogram and possible stent removal versus replacement after undergoing chemotherapy with 50% shrinkage and tumor size on recent PET scan.  After reviewing the management options for treatment, they elected to proceed with the above surgical procedure(s). We have discussed the potential benefits and risks of the procedure, side effects of the proposed treatment, the likelihood of the patient achieving the goals of the procedure, and any potential problems that might occur during the procedure or recuperation. Informed consent has been obtained.  Description of procedure:  The patient was taken to the operating room and general anesthesia was induced. SCDs were placed for DVT prophylaxis. The patient was placed in the dorsal lithotomy position, prepped and draped in the usual sterile fashion, and preoperative antibiotics were administered. A preoperative time-out was performed.   A 21 French rigid cystoscope was used to intubate the urethra and thorough cystoscopy was performed.  The bladder was grossly normal.  The left ureteral stent was grasped and removed.  A 5 French access  catheter was used to intubate the left ureteral orifice and a retrograde pyelogram was performed showing no definite hydronephrosis or stricture.  After 5 to 10 minutes a repeat x-ray was performed and showed retained contrast in the left kidney without significant drainage.  Under cystoscopic vision there was some efflux from the left ureter.  I opted to replace the stent.  A 6 French by 24 cm Bard Optima stent was placed fluoroscopically with a curl in the renal pelvis as well as in the bladder under direct vision, and fluid drained through the side ports of the stent.  Disposition: Stable to PACU  Plan: Follow-up in clinic in 3 to 4 months for symptom check, discussed long-term stent management, can consider removal again in 3 to 12 months pending response to oncology treatments and repeat PET scan  Redell Burnet, MD

## 2024-07-24 NOTE — Telephone Encounter (Signed)
 New IS appts were scheduled, I called and spoke with pt to confirm appt date and times. Pt agreed.

## 2024-07-25 ENCOUNTER — Encounter: Payer: Self-pay | Admitting: Urology

## 2024-07-25 ENCOUNTER — Ambulatory Visit

## 2024-07-25 ENCOUNTER — Encounter: Payer: Self-pay | Admitting: Oncology

## 2024-07-27 ENCOUNTER — Ambulatory Visit

## 2024-07-28 ENCOUNTER — Inpatient Hospital Stay

## 2024-07-28 ENCOUNTER — Inpatient Hospital Stay: Admitting: Oncology

## 2024-07-28 ENCOUNTER — Encounter: Payer: Self-pay | Admitting: Oncology

## 2024-07-28 ENCOUNTER — Other Ambulatory Visit: Payer: Self-pay

## 2024-07-28 NOTE — Anesthesia Postprocedure Evaluation (Signed)
"   Anesthesia Post Note  Patient: Valera Vallas  Procedure(s) Performed: CYSTOSCOPY, FLEXIBLE, WITH STENT REPLACEMENT (Left) CYSTOSCOPY, WITH RETROGRADE PYELOGRAM (Left)  Patient location during evaluation: PACU Anesthesia Type: General Level of consciousness: awake and alert Pain management: pain level controlled Vital Signs Assessment: post-procedure vital signs reviewed and stable Respiratory status: spontaneous breathing, nonlabored ventilation, respiratory function stable and patient connected to nasal cannula oxygen Cardiovascular status: blood pressure returned to baseline and stable Postop Assessment: no apparent nausea or vomiting Anesthetic complications: no   No notable events documented.   Last Vitals:  Vitals:   07/24/24 1315 07/24/24 1328  BP: 115/75 130/76  Pulse: 82 76  Resp: 13 15  Temp: 36.6 C 37.2 C  SpO2: 100% 98%    Last Pain:  Vitals:   07/25/24 0833  TempSrc:   PainSc: 0-No pain                 Lynwood KANDICE Clause      "

## 2024-07-29 ENCOUNTER — Other Ambulatory Visit: Payer: Self-pay | Admitting: Hospice and Palliative Medicine

## 2024-07-29 ENCOUNTER — Other Ambulatory Visit: Payer: Self-pay

## 2024-07-31 ENCOUNTER — Encounter: Payer: Self-pay | Admitting: Oncology

## 2024-07-31 ENCOUNTER — Other Ambulatory Visit: Payer: Self-pay

## 2024-07-31 MED FILL — Oxycodone HCl Tab 10 MG: ORAL | 10 days supply | Qty: 60 | Fill #0 | Status: AC

## 2024-08-01 ENCOUNTER — Ambulatory Visit

## 2024-08-02 ENCOUNTER — Other Ambulatory Visit: Payer: Self-pay

## 2024-08-02 ENCOUNTER — Encounter: Payer: Self-pay | Admitting: Oncology

## 2024-08-02 ENCOUNTER — Inpatient Hospital Stay

## 2024-08-02 NOTE — Progress Notes (Signed)
 See phone note

## 2024-08-02 NOTE — Progress Notes (Signed)
 Nutrition  Sister, Tawni had question regarding patient eating fresh fruits and vegetables.    Shared recommendations from [employer] regarding Food Safety during treatment.   Sister appreciative of call back.    Dawne Casali B. Dasie SOLON, CSO, LDN Registered Dietitian [phone]

## 2024-08-03 ENCOUNTER — Ambulatory Visit

## 2024-08-04 ENCOUNTER — Inpatient Hospital Stay

## 2024-08-04 ENCOUNTER — Inpatient Hospital Stay: Admitting: Oncology

## 2024-08-04 ENCOUNTER — Encounter: Payer: Self-pay | Admitting: Oncology

## 2024-08-04 ENCOUNTER — Other Ambulatory Visit: Payer: Self-pay

## 2024-08-04 VITALS — BP 119/75 | Resp 20

## 2024-08-04 VITALS — BP 108/81 | HR 83 | Temp 98.0°F | Wt 166.4 lb

## 2024-08-04 DIAGNOSIS — N1330 Unspecified hydronephrosis: Secondary | ICD-10-CM | POA: Diagnosis not present

## 2024-08-04 DIAGNOSIS — C8330 Diffuse large B-cell lymphoma, unspecified site: Secondary | ICD-10-CM | POA: Diagnosis not present

## 2024-08-04 DIAGNOSIS — G629 Polyneuropathy, unspecified: Secondary | ICD-10-CM

## 2024-08-04 DIAGNOSIS — G893 Neoplasm related pain (acute) (chronic): Secondary | ICD-10-CM | POA: Diagnosis not present

## 2024-08-04 DIAGNOSIS — Z5111 Encounter for antineoplastic chemotherapy: Secondary | ICD-10-CM

## 2024-08-04 DIAGNOSIS — Z5112 Encounter for antineoplastic immunotherapy: Secondary | ICD-10-CM | POA: Diagnosis not present

## 2024-08-04 DIAGNOSIS — I2699 Other pulmonary embolism without acute cor pulmonale: Secondary | ICD-10-CM | POA: Diagnosis not present

## 2024-08-04 LAB — CBC (CANCER CENTER ONLY)
HCT: 34.3 % — ABNORMAL LOW (ref 36.0–46.0)
Hemoglobin: 11 g/dL — ABNORMAL LOW (ref 12.0–15.0)
MCH: 30.8 pg (ref 26.0–34.0)
MCHC: 32.1 g/dL (ref 30.0–36.0)
MCV: 96.1 fL (ref 80.0–100.0)
Platelet Count: 137 K/uL — ABNORMAL LOW (ref 150–400)
RBC: 3.57 MIL/uL — ABNORMAL LOW (ref 3.87–5.11)
RDW: 16.5 % — ABNORMAL HIGH (ref 11.5–15.5)
WBC Count: 4.1 K/uL (ref 4.0–10.5)
nRBC: 0 % (ref 0.0–0.2)

## 2024-08-04 LAB — CMP (CANCER CENTER ONLY)
ALT: 17 U/L (ref 0–44)
AST: 21 U/L (ref 15–41)
Albumin: 3.6 g/dL (ref 3.5–5.0)
Alkaline Phosphatase: 63 U/L (ref 38–126)
Anion gap: 9 (ref 5–15)
BUN: 9 mg/dL (ref 8–23)
CO2: 28 mmol/L (ref 22–32)
Calcium: 8.9 mg/dL (ref 8.9–10.3)
Chloride: 105 mmol/L (ref 98–111)
Creatinine: 0.43 mg/dL — ABNORMAL LOW (ref 0.44–1.00)
GFR, Estimated: >60 mL/min
Glucose, Bld: 89 mg/dL (ref 70–99)
Potassium: 3.9 mmol/L (ref 3.5–5.1)
Sodium: 141 mmol/L (ref 135–145)
Total Bilirubin: 0.2 mg/dL (ref 0.0–1.2)
Total Protein: 6 g/dL — ABNORMAL LOW (ref 6.5–8.1)

## 2024-08-04 MED ORDER — ACETAMINOPHEN 325 MG PO TABS
650.0000 mg | ORAL_TABLET | Freq: Once | ORAL | Status: AC
  Administered 2024-08-04: 650 mg via ORAL
  Filled 2024-08-04: qty 2

## 2024-08-04 MED ORDER — DIPHENHYDRAMINE HCL 25 MG PO TABS
50.0000 mg | ORAL_TABLET | Freq: Once | ORAL | Status: AC
  Administered 2024-08-04: 50 mg via ORAL
  Filled 2024-08-04: qty 2

## 2024-08-04 MED ORDER — SODIUM CHLORIDE 0.9 % IV SOLN
INTRAVENOUS | Status: DC
  Filled 2024-08-04: qty 250

## 2024-08-04 MED ORDER — SODIUM CHLORIDE 0.9 % IV SOLN
375.0000 mg/m2 | Freq: Once | INTRAVENOUS | Status: AC
  Administered 2024-08-04: 700 mg via INTRAVENOUS
  Filled 2024-08-04: qty 20

## 2024-08-04 NOTE — Assessment & Plan Note (Signed)
 Chemotherapy treatment as planned above

## 2024-08-04 NOTE — Assessment & Plan Note (Signed)
 Sciatic pain/neuropathic pain.   Pre existing symptoms prior to chemotherapy. Left hip/buttock pain has improved after chemo, however worse of calf and foot pain is worse. Suspect that she has radiculopathy. Acute chemotherapy neuro toxicity is possible, unilateral symptom favors radiculopathy.  MRI lumbar results were reviewed. Neurology recommendation was reviewed. no severe neuro-foraminal stenosis that would need to be addressed surgically. .  Vincristine  has been discontinued.   Continue Liyrica and Cymbalta .-Patient was seen by Dr. Buckley and plan to switch care to another neurologist.  Currently she has appointment in May 2026 with possible early appointments if there is cancellation.

## 2024-08-04 NOTE — Assessment & Plan Note (Addendum)
 DLBCL of left pelvic wall mass, Atypical t (8; 14), BCL2 BCL6 monosomy.  Negative for BCL2, BCL6 and MYC rearrangement PET scan evaluation showed mild hypermetabolism in spleen.  Nonspecific.  Likely stage I/II disease. Bone marrow biopsy is negative.   IPI score 1-2 Baseline Echo showed LVEF 55-60% Grade I diastolic dysfunction   10/29/205 Cycle 1 R CHOP  - hospitalized due to neutropenic fever, complicated UTI 05/31/2024 cycle 2 R -mini CHP with D3 GCSF.  Hold Vincristine  due to  left foot neuropathy.  Hospitalization due to sepsis, pneumonia,   Patient did not tolerate chemotherapy well despite dose reduction. Poor performance status Repeat PET scan  partial metabolic response with residual Deauville score 4 activity of left pelvic sidewall soft tissue.  Consider radiation.  Recommend Rituximab  single agent today. Refer to [facility] for 2nd opinion.

## 2024-08-04 NOTE — Assessment & Plan Note (Signed)
 S/p urinary stent exchange. Recurrent UTI, on Macrobid  for suppressive therapy

## 2024-08-04 NOTE — Assessment & Plan Note (Signed)
Eliquis 5 mg BID

## 2024-08-04 NOTE — Progress Notes (Signed)
 Pt here for follow up. Pt on Long ter Macrobid  due to stent placement

## 2024-08-04 NOTE — Progress Notes (Signed)
 " Hematology/Oncology Progress note [phone] [phone]      CHIEF COMPLAINTS/PURPOSE OF CONSULTATION:  DLBCL   ASSESSMENT & PLAN:   Cancer Staging  Diffuse large B-cell lymphoma (HCC) Staging form: Hodgkin and Non-Hodgkin Lymphoma, AJCC 8th Edition - Clinical stage from 04/26/2024: Stage II (Diffuse large B-cell lymphoma) - Signed by Babara Call, MD on 04/26/2024   Diffuse large B-cell lymphoma (HCC) DLBCL of left pelvic wall mass, Atypical t (8; 14), BCL2 BCL6 monosomy.  Negative for BCL2, BCL6 and MYC rearrangement PET scan evaluation showed mild hypermetabolism in spleen.  Nonspecific.  Likely stage I/II disease. Bone marrow biopsy is negative.   IPI score 1-2 Baseline Echo showed LVEF 55-60% Grade I diastolic dysfunction   10/29/205 Cycle 1 R CHOP  - hospitalized due to neutropenic fever, complicated UTI 05/31/2024 cycle 2 R -mini CHP with D3 GCSF.  Hold Vincristine  due to  left foot neuropathy  Patient did not tolerate chemotherapy well despite dose reduction. Poor performance status Repeat PET scan  partial metabolic response with residual Deauville score 4 activity of left pelvic sidewall soft tissue.  Consider radiation.  Recommend Rituximab  single agent today. Refer to [facility] for 2nd opinion.   Encounter for antineoplastic chemotherapy Chemotherapy treatment as planned above  Hydronephrosis of left kidney S/p urinary stent exchange. Recurrent UTI, on Macrobid  for suppressive therapy  Neoplasm related pain Continue Oxycodone  5mg  Q4h PRN.  Fentanyl  patch 25mcg   Neuropathy Sciatic pain/neuropathic pain.   Pre existing symptoms prior to chemotherapy. Left hip/buttock pain has improved after chemo, however worse of calf and foot pain is worse. Suspect that she has radiculopathy. Acute chemotherapy neuro toxicity is possible, unilateral symptom favors radiculopathy.  MRI lumbar results were reviewed. Neurology recommendation was reviewed. no severe  neuro-foraminal stenosis that would need to be addressed surgically. .  Vincristine  has been discontinued.   Continue Liyrica and Cymbalta .-Patient was seen by Dr. Buckley and plan to switch care to another neurologist.  Currently she has appointment in May 2026 with possible early appointments if there is cancellation.   Pulmonary embolism (HCC) Eliquis  5mg  BID   Orders Placed This Encounter  Procedures   CMP (Cancer Center only)    Standing Status:   Future    Expected Date:   08/28/2024    Expiration Date:   08/28/2025   CBC (Cancer Center Only)    Standing Status:   Future    Expected Date:   08/28/2024    Expiration Date:   08/28/2025   Ambulatory referral to Hematology / Oncology    Referral Priority:   Routine    Referral Type:   Consultation    Referral Reason:   Specialty Services Required    Requested Specialty:   Oncology    Number of Visits Requested:   1   Follow up per LOS All questions were answered. The patient knows to call the clinic with any problems, questions or concerns.  Call Babara, MD, PhD [facility] Hematology Oncology 08/04/2024    HISTORY OF PRESENTING ILLNESS:  Stacey [name] 64 y.o. female presents to establish care for large B-cell lymphoma I have reviewed her chart and materials related to her cancer extensively and collaborated history with the patient. Summary of oncologic history is as follows: Oncology History  Diffuse large B-cell lymphoma (HCC)  04/08/2024 Imaging   CT renal stone study showed  1. Mild left hydroureteronephrosis with periureteral/peripelvic stranding, no urinary calculi. 2. Imaging findings are compatible with recently passed left ureteral  stone versus acute left pyelonephritis.   04/08/2024 Imaging   CT angiogram abdomen/pelvis with and without contrast  1. Obstructing 5.9 x 4.5 cm enhancing mass along the left pelvic sidewall causing left hydroureter, hydronephrosis, delayed nephrogram, and nephromegaly; mass abuts  the left adnexa with loss of the intervening fat plane. This is highly concerning for malignant neoplasm. Etiology indeterminate. It's possible that this could be arising from a primary urothelial neoplasm of the left ureter. Other primary neoplasms or metastatic disease not excluded. Correlation with tissue sampling is advised. 2. Patent abdominal vasculature without signs of mesenteric ischemia.   04/24/2024 Imaging   PET scan showed  1. The posterior left pelvic sidewall mass is hypermetabolic, consistent with active lymphoma. 2. Mild splenic hypermetabolism relative to the liver. No splenomegaly. Cannot exclude lymphomatous involvement. 3. New small right pleural effusion. 4. No active lymphoma within the chest or neck. Mildly decreased sensitivity exam secondary to hypermetabolic brown fat. 5. Age advanced coronary artery atherosclerosis. Recommend assessment of coronary risk factors. 6.  Aortic Atherosclerosis    04/26/2024 Initial Diagnosis   Lymphoma (HCC)  Symptoms began on 03/10/2024 with pain radiating down the left leg, associated with the sciatic nerve. Initially managed with high doses of ibuprofen, which provided some relief, but the pain worsened within a week, leading to an emergency room visit where she received a steroid injection in her lower back.  04/08/2024 she returned to the hospital due to increased pain. A CT scan revealed a pelvic mass obstructing the ureter, and a stent was placed by a urologist.  Patient underwent biopsy of pelvis mass Pathology showed 1. Lymph node, needle/core biopsy, 18G cores infilatratic left pelvic sidewall mass :       LARGE B-CELL LYMPHOMA.  soft      tissue with atypical lymphoid infiltrate comprised predominantly of medium to      large sized cells.  Abundant mitotic figures are present.  Focal areas of      necrosis are noted.  Immunohistochemical stains reveal the malignant cells are      positive for CD20, PAX5, BCL6  (subset), MUM1, Bcl-2 and are negative for CD10      and cyclin D1.  CD30 does not show any significant expression. CD15 highlights      neutrophils. The Ki-67 proliferation index is high approximately 70%.  This lymphoma has a activated B-cell immunophenotype.   EBER-ish is pending and will be reported in an addendum.  Also,High-grade B-cell lymphoma FISH panel will be   ordered to exclude a double/triple hit lymphoma and results will be reported in an addendum   She has experienced unintentional weight loss of 18 pounds since May, without any changes in diet or exercise. No night sweats, low-grade fever, or excessive sweating at night. Baseline health includes chronic back pain, acid reflux, prediabetes, and constipation.  She is taking gabapentin  100 mg once daily for nerve pain, described as sometimes dull and sometimes sharp. Oxycodone  5 mg every six hours for pain management, which helps reduce the pain slightly.  She notes reduced appetite, numbness, and tingling in her leg. Also mentions leg swelling, particularly at night when less active.   04/26/2024 Cancer Staging   Staging form: Hodgkin and Non-Hodgkin Lymphoma, AJCC 8th Edition - Clinical stage from 04/26/2024: Stage II (Diffuse large B-cell lymphoma) - Signed by Babara Call, MD on 04/26/2024 Stage prefix: Initial diagnosis   05/01/2024 Bone Marrow Biopsy   Bone marrow biopsy results showed  BONE MARROW, ASPIRATE, CLOT, CORE:  -  Mildly hypocellular bone marrow with otherwise orderly trilineage hematopoiesis  - No morphologic or immunophenotypic evidence of the patient's recently diagnosed large B-cell lymphoma    05/10/2024 - 06/02/2024 Chemotherapy   Patient is on Treatment Plan : NON-HODGKINS LYMPHOMA R-CHOP q21d x 4 cycles     07/12/2024 Imaging   PET scan showed  1. Partial metabolic response (Deauville score 4) with decreased FDG uptake and decreased size of the left pelvic sidewall soft tissue (SUV max 4.7, previously  14.1), without new FDG-avid disease. 2. No other metabolically active disease identified.   08/04/2024 -  Chemotherapy   Patient is on Treatment Plan : NON-HODGKINS LYMPHOMA Rituximab  q7d      Discussed the use of AI scribe software for clinical note transcription with the patient, who gave verbal consent to proceed.   Appetite has slightly improved. Gained weight. Still feels weak.  she is on chronic suppressive nitrofurantoin  therapy and uses probiotics.  She denies current fevers, chills, sweats, diarrhea, hematuria, or dysuria.  She continues to experience persistent lower extremity tingling, swelling, and pain, attributed to chemotherapy-induced peripheral neuropathy and pre-existing sciatic nerve issues. Lyrica  has provided limited benefit. She is under the care of an orthopedic physician and had piriformis muscle and nerve root injections.  Neurology follow-up is scheduled in May 2025. She takes oxycodone  and a fentanyl  patch for pain, which she finds helpful. She also takes trazodone  for sleep and omeprazole  for acid reflux, which is controlled.      MEDICAL HISTORY:  Past Medical History:  Diagnosis Date   Arthritis    left shoulder, neck, lower back  (01/11/2018)   Cancer (HCC)    lymphoma, on chemo   Chronic lower back pain    Family history of adverse reaction to anesthesia    Mother has nausea   GERD (gastroesophageal reflux disease)    Neuropathy    L leg   PONV (postoperative nausea and vomiting)    Pre-diabetes    Swelling 2025   left calf, left foot- Sept-Oct 2025   UTI (urinary tract infection) 03/20/2021    SURGICAL HISTORY: Past Surgical History:  Procedure Laterality Date   APPENDECTOMY     CYSTOSCOPY W/ RETROGRADES Left 07/24/2024   Procedure: CYSTOSCOPY, WITH RETROGRADE PYELOGRAM;  Surgeon: Francisca Redell BROCKS, MD;  Location: [facility] ORS;  Service: Urology;  Laterality: Left;   CYSTOSCOPY W/ URETERAL STENT PLACEMENT Left 04/08/2024   Procedure: CYSTOSCOPY,  WITH RETROGRADE PYELOGRAM AND URETERAL STENT INSERTION;  Surgeon: Watt Rush, MD;  Location: [facility] ORS;  Service: Urology;  Laterality: Left;   CYSTOSCOPY W/ URETERAL STENT PLACEMENT Left 07/24/2024   Procedure: CYSTOSCOPY, FLEXIBLE, WITH STENT REPLACEMENT;  Surgeon: Francisca Redell BROCKS, MD;  Location: [facility] ORS;  Service: Urology;  Laterality: Left;   IR IMAGING GUIDED PORT INSERTION  05/01/2024   JOINT REPLACEMENT     TOTAL SHOULDER ARTHROPLASTY Left 01/11/2018   TOTAL SHOULDER ARTHROPLASTY Left 01/11/2018   Procedure: LEFT TOTAL SHOULDER ARTHROPLASTY;  Surgeon: Sharl Selinda Dover, MD;  Location: [facility] OR;  Service: Orthopedics;  Laterality: Left;  2.5 hrs   TOTAL SHOULDER ARTHROPLASTY Right 02/08/2020   Procedure: TOTAL SHOULDER ARTHROPLASTY;  Surgeon: Sharl Selinda Dover, MD;  Location: [facility] OR;  Service: Orthopedics;  Laterality: Right;  2.5 hrs RNFA    SOCIAL HISTORY: Social History   Socioeconomic History   Marital status: Married    Spouse name: Jacarra Bobak   Number of children: 0   Years of education: Not on file   Highest  education level: 12th grade  Occupational History   Not on file  Tobacco Use   Smoking status: Never   Smokeless tobacco: Never  Vaping Use   Vaping status: Never Used  Substance and Sexual Activity   Alcohol use: Not Currently    Comment: 01/11/2018 might have 1 drink/month   Drug use: Never   Sexual activity: Yes  Other Topics Concern   Not on file  Social History Narrative   Lives at home with husband   Social Drivers of Health   Tobacco Use: Low Risk (08/04/2024)   Patient History    Smoking Tobacco Use: Never    Smokeless Tobacco Use: Never    Passive Exposure: Not on file  Financial Resource Strain: Low Risk  (03/30/2024)   Received from [facility]   Overall Financial Resource Strain (CARDIA)    Difficulty of Paying Living Expenses: Not hard at all  Food Insecurity: No Food Insecurity (06/24/2024)   Epic    Worried  About Radiation Protection Practitioner of Food in the Last Year: Never true    Ran Out of Food in the Last Year: Never true  Transportation Needs: No Transportation Needs (06/24/2024)   Epic    Lack of Transportation (Medical): No    Lack of Transportation (Non-Medical): No  Physical Activity: Not on file  Stress: Not on file  Social Connections: Socially Integrated (05/17/2024)   Social Connection and Isolation Panel    Frequency of Communication with Friends and Family: Three times a week    Frequency of Social Gatherings with Friends and Family: Three times a week    Attends Religious Services: 1 to 4 times per year    Active Member of Clubs or Organizations: Yes    Attends Banker Meetings: 1 to 4 times per year    Marital Status: Married  Catering Manager Violence: Not At Risk (06/24/2024)   Epic    Fear of Current or Ex-Partner: No    Emotionally Abused: No    Physically Abused: No    Sexually Abused: No  Depression (PHQ2-9): Low Risk (08/04/2024)   Depression (PHQ2-9)    PHQ-2 Score: 0  Alcohol Screen: Not on file  Housing: Low Risk (06/24/2024)   Epic    Unable to Pay for Housing in the Last Year: No    Number of Times Moved in the Last Year: 0    Homeless in the Last Year: No  Utilities: Not At Risk (06/24/2024)   Epic    Threatened with loss of utilities: No  Health Literacy: Not on file    FAMILY HISTORY: Family History  Problem Relation Age of Onset   Hypertension Mother    Heart disease Father    Heart attack Father    CAD Father    Colon cancer Neg Hx    Esophageal cancer Neg Hx    Rectal cancer Neg Hx    Stomach cancer Neg Hx     ALLERGIES:  is allergic to propofol .  MEDICATIONS:  Current Outpatient Medications  Medication Sig Dispense Refill   apixaban  (ELIQUIS ) 5 MG TABS tablet Take 2 tablets (10 mg total) by mouth 2 (two) times daily for 3 days, THEN 1 tablet (5 mg total) 2 (two) times daily. (Patient taking differently: 1 tablet (5 mg total) 2 (two)  times daily.) 60 tablet 2   DULoxetine  (CYMBALTA ) 30 MG capsule Take 1 capsule (30 mg total) by mouth daily. 30 capsule 3   fentaNYL  (DURAGESIC ) 25  MCG/HR Place 1 patch onto the skin every 3 (three) days. 5 patch 0   loratadine  (CLARITIN ) 10 MG tablet Take 10 mg by mouth daily.     magic mouthwash (multi-ingredient) oral suspension Swish and swallow 5-10 mLs 4 (four) times daily as needed for mouth pain. 480 mL 1   methocarbamol  (ROBAXIN ) 500 MG tablet Take 500 mg by mouth every 8 (eight) hours as needed for muscle spasms.  0   nitrofurantoin , macrocrystal-monohydrate, (MACROBID ) 100 MG capsule Take 1 capsule (100 mg total) by mouth daily. 90 capsule 3   omeprazole  (PRILOSEC) 20 MG capsule TAKE 1 CAPSULE (20 MG TOTAL) BY MOUTH 2 (TWO) TIMES DAILY BEFORE A MEAL. 180 capsule 0   oxybutynin  (DITROPAN -XL) 10 MG 24 hr tablet Take 1 tablet (10 mg total) by mouth daily. 30 tablet 1   Oxycodone  HCl 10 MG TABS Take 1 tablet (10 mg total) by mouth every 4 (four) hours as needed (pain). 60 tablet 0   polyethylene glycol powder (GLYCOLAX /MIRALAX ) 17 GM/SCOOP powder Take 17 g by mouth daily as needed. Dissolve 1 capful (17g) in 4-8 ounces of liquid and take by mouth daily. 238 g 1   pregabalin  (LYRICA ) 75 MG capsule Take 1 capsule (75 mg total) by mouth 2 (two) times daily. 60 capsule 2   tamsulosin  (FLOMAX ) 0.4 MG CAPS capsule Take 1 capsule (0.4 mg total) by mouth daily. 30 capsule 1   traZODone  (DESYREL ) 50 MG tablet Take 1 tablet (50 mg total) by mouth at bedtime as needed for sleep. 30 tablet 3   doxycycline  (VIBRAMYCIN ) 100 MG capsule Take 1 capsule (100 mg total) by mouth every 12 (twelve) hours. (Patient not taking: Reported on 08/04/2024) 14 capsule 0   ondansetron  (ZOFRAN -ODT) 8 MG disintegrating tablet Take 8 mg by mouth every 8 (eight) hours as needed.     No current facility-administered medications for this visit.    Review of Systems  Constitutional:  Positive for appetite change and fatigue.  Negative for chills, fever and unexpected weight change.  HENT:   Negative for hearing loss and voice change.   Eyes:  Negative for eye problems.  Respiratory:  Negative for chest tightness and cough.   Cardiovascular:  Negative for chest pain.  Gastrointestinal:  Negative for abdominal distention, abdominal pain and blood in stool.  Endocrine: Negative for hot flashes.  Genitourinary:  Negative for difficulty urinating and frequency.   Musculoskeletal:  Positive for back pain. Negative for arthralgias.  Skin:  Negative for itching and rash.  Neurological:  Positive for numbness. Negative for extremity weakness.  Hematological:  Negative for adenopathy.  Psychiatric/Behavioral:  Negative for confusion.      PHYSICAL EXAMINATION: ECOG PERFORMANCE STATUS: 2 - Symptomatic, <50% confined to bed  Vitals:   08/04/24 0904  BP: 108/81  Pulse: 83  Temp: 98 F (36.7 C)  SpO2: 98%   Filed Weights   08/04/24 0904  Weight: 166 lb 6.4 oz (75.5 kg)    Physical Exam Constitutional:      General: She is not in acute distress.    Appearance: She is not diaphoretic.  HENT:     Head: Normocephalic and atraumatic.  Eyes:     General: No scleral icterus. Cardiovascular:     Rate and Rhythm: Normal rate and regular rhythm.  Pulmonary:     Effort: Pulmonary effort is normal. No respiratory distress.     Breath sounds: Normal breath sounds. No wheezing.  Abdominal:     General: There  is no distension.     Palpations: Abdomen is soft.     Tenderness: There is no abdominal tenderness.  Musculoskeletal:     Cervical back: Normal range of motion and neck supple.     Right lower leg: No edema.     Left lower leg: No edema.  Skin:    Findings: No erythema.  Neurological:     Mental Status: She is alert and oriented to person, place, and time. Mental status is at baseline.     Motor: No abnormal muscle tone.  Psychiatric:        Mood and Affect: Mood and affect normal.      LABORATORY  DATA:  I have reviewed the data as listed    Latest Ref Rng & Units 08/04/2024    8:50 AM 06/28/2024    6:17 AM 06/27/2024    5:49 AM  CBC  WBC 4.0 - 10.5 K/uL 4.1  6.2  7.0   Hemoglobin 12.0 - 15.0 g/dL 88.9  8.9  9.1   Hematocrit 36.0 - 46.0 % 34.3  28.0  28.6   Platelets 150 - 400 K/uL 137  203  184       Latest Ref Rng & Units 08/04/2024    8:50 AM 06/28/2024    6:17 AM 06/27/2024    5:49 AM  CMP  Glucose 70 - 99 mg/dL 89  899  885   BUN 8 - 23 mg/dL 9  6  7    Creatinine 0.44 - 1.00 mg/dL 9.56  9.57  9.57   Sodium 135 - 145 mmol/L 141  140  139   Potassium 3.5 - 5.1 mmol/L 3.9  3.8  3.6   Chloride 98 - 111 mmol/L 105  104  102   CO2 22 - 32 mmol/L 28  29  28    Calcium 8.9 - 10.3 mg/dL 8.9  8.8  8.7   Total Protein 6.5 - 8.1 g/dL 6.0     Total Bilirubin 0.0 - 1.2 mg/dL 0.2     Alkaline Phos 38 - 126 U/L 63     AST 15 - 41 U/L 21     ALT 0 - 44 U/L 17        RADIOGRAPHIC STUDIES: I have personally reviewed the radiological images as listed and agreed with the findings in the report. DG OR UROLOGY CYSTO IMAGE ([facility] ONLY) Result Date: 07/24/2024 There is no interpretation for this exam.  This order is for images obtained during a surgical procedure.  Please See Surgeries Tab for more information regarding the procedure.   NM PET Image Restag (PS) Skull Base To Thigh Result Date: 07/12/2024 EXAM: PET AND CT SKULL BASE TO MID THIGH 07/11/2024 10:41:49 AM TECHNIQUE: RADIOPHARMACEUTICAL: 9.22 mCi F-18 FDG Uptake time 60 minutes. Glucose level 119 mg/dl. Blood pool SUV max equals 2.5. Liver SUV max equals 3.4. PET imaging was acquired from the base of the skull to the mid thighs. Non-contrast enhanced computed tomography was obtained for attenuation correction and anatomic localization. COMPARISON: 04/24/2024 CLINICAL HISTORY: lymphoma FINDINGS: HEAD AND NECK: No tracer avid lymph nodes within the soft tissues of the neck. CHEST: No tracer avid mediastinal, hilar, or axillary  lymph nodes. Small bilateral pleural effusions, left greater than right. No tracer avid pulmonary nodule or mass identified. Coronary artery calcifications. ABDOMEN AND PELVIS: No abnormal tracer activity identified within the liver. No abnormal splenic hypermetabolism relative to liver uptake. Normal appearance of the adrenal glands. No tracer avid lymph  nodes identified within the abdomen. Asymmetric increased soft tissue along the left pelvic sidewall is again noted. This currently measures 3.5 x 2.1 cm. The SUV max within this area is equal to 4.7, axial image 128. On the previous exam, this measured 3.7 x 3.3 cm with an SUV max of 14.1. A left-sided nephroureteral stent is in place. No hydronephrosis identified. Physiologic activity within the gastrointestinal and genitourinary systems. BONES AND SOFT TISSUE: No abnormal FDG activity localizes to the bones. No metabolically active aggressive osseous lesion. IMPRESSION: 1. Partial metabolic response (Deauville score 4) with decreased FDG uptake and decreased size of the left pelvic sidewall soft tissue (SUV max 4.7, previously 14.1), without new FDG-avid disease. 2. No other metabolically active disease identified. Electronically signed by: Waddell Calk MD 07/12/2024 05:59 AM EST RP Workstation: [id number]    "

## 2024-08-04 NOTE — Patient Instructions (Signed)
 [facility] - A DEPT OF [facility]  Discharge Instructions: Thank you for choosing [facility] to provide your oncology and hematology care.  If you have a lab appointment with the Cancer Center, please go directly to the Cancer Center and check in at the registration area.  Wear comfortable clothing and clothing appropriate for easy access to any Portacath or PICC line.   We strive to give you quality time with your provider. You may need to reschedule your appointment if you arrive late (15 or more minutes).  Arriving late affects you and other patients whose appointments are after yours.  Also, if you miss three or more appointments without notifying the office, you may be dismissed from the clinic at the provider's discretion.      For prescription refill requests, have your pharmacy contact our office and allow 72 hours for refills to be completed.    Today you received the following chemotherapy and/or immunotherapy agents RITUXAN       To help prevent nausea and vomiting after your treatment, we encourage you to take your nausea medication as directed.  BELOW ARE SYMPTOMS THAT SHOULD BE REPORTED IMMEDIATELY: *FEVER GREATER THAN 100.4 F (38 C) OR HIGHER *CHILLS OR SWEATING *NAUSEA AND VOMITING THAT IS NOT CONTROLLED WITH YOUR NAUSEA MEDICATION *UNUSUAL SHORTNESS OF BREATH *UNUSUAL BRUISING OR BLEEDING *URINARY PROBLEMS (pain or burning when urinating, or frequent urination) *BOWEL PROBLEMS (unusual diarrhea, constipation, pain near the anus) TENDERNESS IN MOUTH AND THROAT WITH OR WITHOUT PRESENCE OF ULCERS (sore throat, sores in mouth, or a toothache) UNUSUAL RASH, SWELLING OR PAIN  UNUSUAL VAGINAL DISCHARGE OR ITCHING   Items with * indicate a potential emergency and should be followed up as soon as possible or go to the Emergency Department if any problems should occur.  Please show the CHEMOTHERAPY ALERT CARD or IMMUNOTHERAPY  ALERT CARD at check-in to the Emergency Department and triage nurse.  Should you have questions after your visit or need to cancel or reschedule your appointment, please contact [facility] - A DEPT OF [facility]  [phone] and follow the prompts.  Office hours are 8:00 a.m. to 4:30 p.m. Monday - Friday. Please note that voicemails left after 4:00 p.m. may not be returned until the following business day.  We are closed weekends and major holidays. You have access to a nurse at all times for urgent questions. Please call the main number to the clinic [phone] and follow the prompts.  For any non-urgent questions, you may also contact your provider using MyChart. We now offer e-Visits for anyone 39 and older to request care online for non-urgent symptoms. For details visit [email or web address].   Also download the MyChart app! Go to the app store, search MyChart, open the app, select [facility], and log in with your MyChart username and password.  Rituximab ; Hyaluronidase  Injection What is this medication? RITUXIMAB ; HYALURONIDASE  (ri TUX i mab; hye al ur ON i dase) treats leukemia and lymphoma. Rituximab  works by blocking a protein that causes cancer cells to grow and multiply. This helps to slow or stop the spread of cancer cells. Hyaluronidase  works by increasing the absorption of the other medications in the body to help them work better. It is a combination medication that contains a monoclonal antibody. This medicine may be used for other purposes; ask your health care provider or pharmacist if you have questions. COMMON BRAND NAME(S):  Rituxan  Hycela What should I tell my care team before I take this medication? They need to know if you have any of these conditions: Heart disease Immune system problems Infection, such as hepatitis B, chickenpox, cold sores, herpes Irregular heartbeat Kidney disease Lung or breathing disease, such as  asthma Recently received or scheduled to receive a vaccine An unusual or allergic reaction to rituximab , rituximab ;hyaluronidase , mouse proteins, other medications, foods, dyes, or preservatives Pregnant or trying to get pregnant Breast-feeding How should I use this medication? This medication is for injection under the skin. It is given by a care team in a hospital or clinic setting. A special MedGuide will be given to you before each treatment. Be sure to read this information carefully each time. Talk to your care team about the use of this medication in children. Special care may be needed. Overdosage: If you think you have taken too much of this medicine contact a poison control center or emergency room at once. NOTE: This medicine is only for you. Do not share this medicine with others. What if I miss a dose? Keep appointments for follow-up doses. It is important not to miss your dose. Call your care team if you are unable to keep an appointment. What may interact with this medication? Do not take this medication with any of the following: Live virus vaccines This medication may also interact with the following: Cisplatin This list may not describe all possible interactions. Give your health care provider a list of all the medicines, herbs, non-prescription drugs, or dietary supplements you use. Also tell them if you smoke, drink alcohol, or use illegal drugs. Some items may interact with your medicine. What should I watch for while using this medication? Your condition will be monitored carefully while you are receiving this medication. You may need blood work while taking this medication. This medication can cause serious allergic reactions. To reduce the risk, your care team may give you other medications to take before receiving this one. Be sure to follow the directions from your care team. In some patients, this medication may cause a serious brain infection that may cause death. If  you have any problems seeing, thinking, speaking, walking, or standing, tell your care team right away. If you cannot reach your care team, urgently seek other source of medical care. This medication may increase your risk of getting an infection. Call your care team for advice if you get a fever, chills, sore throat, or other symptoms of a cold or flu. Do not treat yourself. Try to avoid being around people who are sick. Talk to your care team if you may be pregnant. Serious birth defects can occur if you take this medication during pregnancy and for 12 months after the last dose. Your will need a negative pregnancy test before starting this medication. Contraception is recommended while taking this medication and for 12 months after the last dose. Your care team can help you find the option that works for you. Do not breastfeed while taking this medication and for at least 6 months after the last dose. What side effects may I notice from receiving this medication? Side effects that you should report to your care team as soon as possible: Allergic reactions or angioedema--skin rash, itching or hives, swelling of the face, eyes, lips, tongue, arms, or legs, trouble swallowing or breathing Bowel blockage--stomach cramping, unable to have a bowel movement or pass gas, loss of appetite, vomiting Dizziness, loss of balance or coordination, confusion  or trouble speaking Fever, chills, unusual weakness or fatigue, loss of appetite, nausea, headache, dizziness, feeling faint or lightheaded, shortness of breath, fast or irregular heartbeat, which may be signs of cytokine release syndrome Heart attack--pain or tightness in the chest, shoulders, arms, or jaw, nausea, shortness of breath, cold or clammy skin, feeling faint or lightheaded Heart rhythm changes--fast or irregular heartbeat, dizziness, feeling faint or lightheaded, chest pain, trouble breathing Infection--fever, chills, cough, sore throat, wounds that  don't heal, pain or trouble when passing urine, general feeling of discomfort or being unwell Kidney injury--decrease in the amount of urine, swelling of the ankles, hands, or feet Liver injury--right upper belly pain, loss of appetite, nausea, light-colored stool, dark yellow or brown urine, yellowing skin or eyes, unusual weakness or fatigue Redness, blistering, peeling, or loosening of the skin, including inside the mouth Stomach pain that is severe, does not go away, or gets worse Tumor lysis syndrome (TLS)--nausea, vomiting, diarrhea, decrease in the amount of urine, dark urine, unusual weakness or fatigue, confusion, muscle pain or cramps, fast or irregular heartbeat, joint pain Side effects that usually do not require medical attention (report these to your care team if they continue or are bothersome): Constipation Fatigue Hair loss Nausea Pain, redness, or irritation at injection site This list may not describe all possible side effects. Call your doctor for medical advice about side effects. You may report side effects to FDA at 1-800-FDA-1088. Where should I keep my medication? This medication is given in a hospital or clinic. It will not be stored at home. NOTE: This sheet is a summary. It may not cover all possible information. If you have questions about this medicine, talk to your doctor, pharmacist, or health care provider.  2024 Elsevier/Gold Standard (2021-11-20 00:00:00)

## 2024-08-04 NOTE — Assessment & Plan Note (Signed)
 Continue Oxycodone  5mg  Q4h PRN.  Fentanyl  patch 25mcg

## 2024-08-04 NOTE — Progress Notes (Signed)
 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    Met with patient during infusion.  Reports that appetite is better.  Eating a variety of foods (meats, vegetables, fruits, oatmeal, eggs, breakfast meats).  Drinking boost VHC 2 times a day.  Mixing ice cream or fruit in shake.  Reports that bowels are moving.  Feeling better    Medications: reviewed  Labs: reviewed  Anthropometrics:   Weight 166 lb 6.4 oz  155 lb on 1/9 167 lb on 1/5 12/3 168 lb  158 lb on 11/22 184 lb 8/29   NUTRITION DIAGNOSIS: Inadequate oral intake improving   INTERVENTION:  Continue boost VHC BID Reviewed food safety during treatment. Handout from [employer] provided    MONITORING, EVALUATION, GOAL: weight trends, intake   NEXT VISIT: as needed  Dawood Spitler B. Dasie SOLON, CSO, LDN Registered Dietitian [phone]

## 2024-08-07 ENCOUNTER — Other Ambulatory Visit: Payer: Self-pay

## 2024-08-08 ENCOUNTER — Ambulatory Visit

## 2024-08-08 NOTE — Telephone Encounter (Signed)
 ERROR

## 2024-08-09 ENCOUNTER — Telehealth: Payer: Self-pay

## 2024-08-09 NOTE — Telephone Encounter (Signed)
 Ref faxed to [facility]. Re: Diffuse large B cell lymphoma. Fax confirmation received.   Fax: [phone] Ph: [phone]

## 2024-08-10 ENCOUNTER — Ambulatory Visit

## 2024-08-12 ENCOUNTER — Other Ambulatory Visit: Payer: Self-pay | Admitting: Hospice and Palliative Medicine

## 2024-08-14 ENCOUNTER — Other Ambulatory Visit (HOSPITAL_BASED_OUTPATIENT_CLINIC_OR_DEPARTMENT_OTHER): Payer: Self-pay

## 2024-08-14 ENCOUNTER — Other Ambulatory Visit: Payer: Self-pay

## 2024-08-15 ENCOUNTER — Ambulatory Visit

## 2024-08-15 ENCOUNTER — Other Ambulatory Visit (HOSPITAL_BASED_OUTPATIENT_CLINIC_OR_DEPARTMENT_OTHER): Payer: Self-pay

## 2024-08-15 ENCOUNTER — Encounter: Payer: Self-pay | Admitting: Oncology

## 2024-08-15 ENCOUNTER — Other Ambulatory Visit: Payer: Self-pay

## 2024-08-15 MED ORDER — OXYCODONE HCL 10 MG PO TABS
10.0000 mg | ORAL_TABLET | ORAL | 0 refills | Status: AC | PRN
  Filled 2024-08-15: qty 60, 10d supply, fill #0

## 2024-08-17 ENCOUNTER — Ambulatory Visit

## 2024-08-22 ENCOUNTER — Ambulatory Visit

## 2024-08-24 ENCOUNTER — Ambulatory Visit

## 2024-08-28 ENCOUNTER — Inpatient Hospital Stay

## 2024-08-28 ENCOUNTER — Inpatient Hospital Stay: Admitting: Oncology

## 2024-08-29 ENCOUNTER — Ambulatory Visit

## 2024-09-18 ENCOUNTER — Inpatient Hospital Stay: Admitting: Hospice and Palliative Medicine

## 2024-11-01 ENCOUNTER — Ambulatory Visit: Admitting: Urology

## 2024-11-22 ENCOUNTER — Ambulatory Visit: Admitting: Diagnostic Neuroimaging
